# Patient Record
Sex: Female | Born: 1960 | Race: White | Hispanic: No | Marital: Single | Smoking: Former smoker
Health system: Southern US, Community
[De-identification: ages and names within clinical notes are randomized; demographics above are authoritative.]

## PROBLEM LIST (undated history)

## (undated) DIAGNOSIS — Z803 Family history of malignant neoplasm of breast: Secondary | ICD-10-CM

## (undated) DIAGNOSIS — G43909 Migraine, unspecified, not intractable, without status migrainosus: Secondary | ICD-10-CM

## (undated) DIAGNOSIS — B019 Varicella without complication: Secondary | ICD-10-CM

## (undated) DIAGNOSIS — F17200 Nicotine dependence, unspecified, uncomplicated: Secondary | ICD-10-CM

## (undated) DIAGNOSIS — F329 Major depressive disorder, single episode, unspecified: Secondary | ICD-10-CM

## (undated) DIAGNOSIS — Z800 Family history of malignant neoplasm of digestive organs: Secondary | ICD-10-CM

## (undated) DIAGNOSIS — Z923 Personal history of irradiation: Secondary | ICD-10-CM

## (undated) DIAGNOSIS — T7840XA Allergy, unspecified, initial encounter: Secondary | ICD-10-CM

## (undated) DIAGNOSIS — D420 Neoplasm of uncertain behavior of cerebral meninges: Secondary | ICD-10-CM

## (undated) DIAGNOSIS — C50919 Malignant neoplasm of unspecified site of unspecified female breast: Secondary | ICD-10-CM

## (undated) DIAGNOSIS — Z85828 Personal history of other malignant neoplasm of skin: Secondary | ICD-10-CM

## (undated) DIAGNOSIS — H532 Diplopia: Secondary | ICD-10-CM

## (undated) DIAGNOSIS — Z9289 Personal history of other medical treatment: Secondary | ICD-10-CM

## (undated) DIAGNOSIS — F419 Anxiety disorder, unspecified: Secondary | ICD-10-CM

## (undated) DIAGNOSIS — K219 Gastro-esophageal reflux disease without esophagitis: Secondary | ICD-10-CM

## (undated) DIAGNOSIS — F32A Depression, unspecified: Secondary | ICD-10-CM

## (undated) DIAGNOSIS — I669 Occlusion and stenosis of unspecified cerebral artery: Secondary | ICD-10-CM

## (undated) DIAGNOSIS — R432 Parageusia: Secondary | ICD-10-CM

## (undated) DIAGNOSIS — R569 Unspecified convulsions: Secondary | ICD-10-CM

## (undated) DIAGNOSIS — Z8041 Family history of malignant neoplasm of ovary: Secondary | ICD-10-CM

## (undated) DIAGNOSIS — R002 Palpitations: Secondary | ICD-10-CM

## (undated) DIAGNOSIS — Z808 Family history of malignant neoplasm of other organs or systems: Secondary | ICD-10-CM

## (undated) HISTORY — DX: Personal history of other malignant neoplasm of skin: Z85.828

## (undated) HISTORY — DX: Unspecified convulsions: R56.9

## (undated) HISTORY — DX: Malignant neoplasm of unspecified site of unspecified female breast: C50.919

## (undated) HISTORY — DX: Palpitations: R00.2

## (undated) HISTORY — DX: Family history of malignant neoplasm of other organs or systems: Z80.8

## (undated) HISTORY — DX: Family history of malignant neoplasm of digestive organs: Z80.0

## (undated) HISTORY — DX: Allergy, unspecified, initial encounter: T78.40XA

## (undated) HISTORY — DX: Family history of malignant neoplasm of breast: Z80.3

## (undated) HISTORY — DX: Nicotine dependence, unspecified, uncomplicated: F17.200

## (undated) HISTORY — DX: Migraine, unspecified, not intractable, without status migrainosus: G43.909

## (undated) HISTORY — PX: COLONOSCOPY: SHX174

## (undated) HISTORY — DX: Occlusion and stenosis of unspecified cerebral artery: I66.9

## (undated) HISTORY — DX: Family history of malignant neoplasm of ovary: Z80.41

## (undated) HISTORY — DX: Parageusia: R43.2

## (undated) HISTORY — DX: Gastro-esophageal reflux disease without esophagitis: K21.9

## (undated) HISTORY — DX: Anxiety disorder, unspecified: F41.9

## (undated) HISTORY — DX: Varicella without complication: B01.9

## (undated) HISTORY — DX: Neoplasm of uncertain behavior of cerebral meninges: D42.0

## (undated) HISTORY — PX: OTHER SURGICAL HISTORY: SHX169

## (undated) HISTORY — DX: Diplopia: H53.2

---

## 1988-10-18 HISTORY — PX: WISDOM TOOTH EXTRACTION: SHX21

## 1998-04-07 ENCOUNTER — Emergency Department (HOSPITAL_COMMUNITY): Admission: EM | Admit: 1998-04-07 | Discharge: 1998-04-07 | Payer: Self-pay | Admitting: Emergency Medicine

## 1998-10-23 ENCOUNTER — Other Ambulatory Visit: Admission: RE | Admit: 1998-10-23 | Discharge: 1998-10-23 | Payer: Self-pay | Admitting: Obstetrics and Gynecology

## 1999-12-24 ENCOUNTER — Other Ambulatory Visit: Admission: RE | Admit: 1999-12-24 | Discharge: 1999-12-24 | Payer: Self-pay | Admitting: Gynecology

## 2001-01-19 ENCOUNTER — Other Ambulatory Visit: Admission: RE | Admit: 2001-01-19 | Discharge: 2001-01-19 | Payer: Self-pay | Admitting: Obstetrics and Gynecology

## 2002-02-05 ENCOUNTER — Other Ambulatory Visit: Admission: RE | Admit: 2002-02-05 | Discharge: 2002-02-05 | Payer: Self-pay | Admitting: Obstetrics and Gynecology

## 2006-10-18 HISTORY — PX: APPENDECTOMY: SHX54

## 2007-05-10 ENCOUNTER — Other Ambulatory Visit: Admission: RE | Admit: 2007-05-10 | Discharge: 2007-05-10 | Payer: Self-pay | Admitting: Obstetrics and Gynecology

## 2009-03-04 ENCOUNTER — Other Ambulatory Visit: Admission: RE | Admit: 2009-03-04 | Discharge: 2009-03-04 | Payer: Self-pay

## 2010-07-21 ENCOUNTER — Ambulatory Visit: Payer: Self-pay | Admitting: Nurse Practitioner

## 2010-10-27 ENCOUNTER — Ambulatory Visit: Admit: 2010-10-27 | Payer: Self-pay | Admitting: Obstetrics & Gynecology

## 2011-03-02 NOTE — Assessment & Plan Note (Signed)
NAME:  Lori Mcmillan, Lori Mcmillan            ACCOUNT NO.:  [id number]   MEDICAL RECORD NO.:  [id number]          PATIENT TYPE:  POB   LOCATION:  [facility] [place]         FACILITY:  [facility]   PHYSICIAN:  Elsie Lincoln, MD      DATE OF BIRTH:  1961-10-02   DATE OF SERVICE:  07/21/2010                                  CLINIC NOTE   The patient comes to office today for her annual exam and Pap smear.  The patient is a new patient to this practice.  She has several  complaints today.  The first is that what she describes as menopausal  symptoms.  She has feeling like she has more anxiety, hot flashes, and  weight gain.  She does have a regular menstrual cycle.  She does not  exercise.  She eats 2 meals per day.  Otherwise, she does not have any  complaints today.  She has gotten her mammograms on a regular basis.  She has no issues there.  She is currently not using birth control.  She  does not have any sexual relationship.  She has used birth control in  the past without any difficulties.   MENSTRUAL HISTORY:  First day of last period was June 27, 2010.  Her periods are regular.  They are 28 days.  She has 6 days of heavy  bleeding.  She does not sleep between her periods.   OBSTETRICAL HISTORY:  G0, P0.  STD history is negative.   PAST MEDICAL HISTORY:  The patient is positive for stomach ulcers,  reflux, some depression, anxiety, fibroids, ovarian cysts.   PAST SURGICAL HISTORY:  The patient has had an appendectomy.   SOCIAL HISTORY:  The patient is employed as a Sports coach.  She has one  cat and one dog.  She does not smoke.  She drinks 1-2 alcoholic  beverages per day.  She does not have risky sexual behavior.   FAMILY HISTORY:  Mother breast cancer.  Grandmother pancreatic cancer.   REVIEW OF SYSTEMS:  Positive for weight gain, anxiety, menopausal  symptoms hot flashes.   PHYSICAL EXAMINATION:  GENERAL:  A well-developed, well-nourished 50-  year-old Caucasian female in no  acute distress.  HEENT:  Head is normocephalic, atraumatic.  Pupils equal and reactive.  Thyroid is not enlarged.  There are no masses appreciated.  BREASTS:  Symmetrical.  There is no skin dimpling.  There is no lumps.  No nipple  discharge.  ABDOMEN:  Obese.  No organomegaly.  No tenderness.  GENITALIA:  Externally, no lesions or discharges; internally, thin white  nonodorous discharge.  Cervix is without lesions.  Bimanual exam, no  cervical motion tenderness.  No ovarian mass.  EXTREMITIES:  Warm and dry.  NEUROLOGIC:  The patient is somewhat anxious and talkative.   ASSESSMENT:  1. Yearly Pap and pelvic.  2. Anxiety.  3. Obesity.   PLAN:  The patient has a regular family practice physician that does her  routine labs and prescribes her medications and she will continue to get  her medications there.  We did talk about restarting her on birth  control pills for control of her heavy menstrual cycle as well as  hopefully some of her menopausal symptoms.  She is given 2 sample packs  of Loestrin FE.  She is also given a prescription for #28 with 11  refills.  Her blood pressure was slightly elevated today.  She will come  back in 3 months and have that rechecked.  She is also sent to [facility] nutritionist for nutritional evaluation and diet plan.  She is  encouraged to exercise and lose a total of 20 pounds.  She is also asked  to see a dermatologist for routine skin care.  She will return here in 3  months.  We will assess how she is doing with her birth control pills as  well as her exercise and weight loss program.  She can certainly contact  us if she has any problems in the meantime.      Remonia Richter, NP    ______________________________  Elsie Lincoln, MD    LR/MEDQ  D:  07/21/2010  T:  07/22/2010  Job:  [id number]

## 2011-11-30 ENCOUNTER — Other Ambulatory Visit: Payer: Self-pay | Admitting: Radiology

## 2011-12-01 ENCOUNTER — Other Ambulatory Visit: Payer: Self-pay | Admitting: Radiology

## 2011-12-01 DIAGNOSIS — C50911 Malignant neoplasm of unspecified site of right female breast: Secondary | ICD-10-CM

## 2011-12-02 ENCOUNTER — Other Ambulatory Visit: Payer: Self-pay | Admitting: *Deleted

## 2011-12-02 ENCOUNTER — Telehealth: Payer: Self-pay | Admitting: *Deleted

## 2011-12-02 DIAGNOSIS — Z171 Estrogen receptor negative status [ER-]: Secondary | ICD-10-CM | POA: Insufficient documentation

## 2011-12-02 DIAGNOSIS — C50419 Malignant neoplasm of upper-outer quadrant of unspecified female breast: Secondary | ICD-10-CM | POA: Insufficient documentation

## 2011-12-02 NOTE — Telephone Encounter (Signed)
Confirmed [facility] for 12/08/11 at 0800 .  Instructions and contact information given.

## 2011-12-05 ENCOUNTER — Ambulatory Visit
Admission: RE | Admit: 2011-12-05 | Discharge: 2011-12-05 | Disposition: A | Discharge status: Home / Self Care | Payer: Managed Care, Other (non HMO) | Source: Ambulatory Visit | Attending: Radiology | Admitting: Radiology

## 2011-12-05 DIAGNOSIS — C50911 Malignant neoplasm of unspecified site of right female breast: Secondary | ICD-10-CM

## 2011-12-05 MED ORDER — GADOBENATE DIMEGLUMINE 529 MG/ML IV SOLN
16.0000 mL | Freq: Once | INTRAVENOUS | Status: AC | PRN
  Administered 2011-12-05: 16 mL via INTRAVENOUS

## 2011-12-06 ENCOUNTER — Other Ambulatory Visit: Payer: Self-pay

## 2011-12-08 ENCOUNTER — Ambulatory Visit: Payer: Managed Care, Other (non HMO)

## 2011-12-08 ENCOUNTER — Ambulatory Visit
Admission: RE | Admit: 2011-12-08 | Discharge: 2011-12-08 | Disposition: A | Discharge status: Home / Self Care | Payer: Managed Care, Other (non HMO) | Source: Ambulatory Visit | Attending: Radiation Oncology | Admitting: Radiation Oncology

## 2011-12-08 ENCOUNTER — Telehealth: Payer: Self-pay | Admitting: *Deleted

## 2011-12-08 ENCOUNTER — Ambulatory Visit: Payer: Managed Care, Other (non HMO) | Attending: General Surgery | Admitting: Physical Therapy

## 2011-12-08 ENCOUNTER — Ambulatory Visit (HOSPITAL_BASED_OUTPATIENT_CLINIC_OR_DEPARTMENT_OTHER): Payer: Managed Care, Other (non HMO) | Admitting: General Surgery

## 2011-12-08 ENCOUNTER — Ambulatory Visit (HOSPITAL_BASED_OUTPATIENT_CLINIC_OR_DEPARTMENT_OTHER): Payer: Managed Care, Other (non HMO) | Admitting: Oncology

## 2011-12-08 ENCOUNTER — Other Ambulatory Visit: Payer: Managed Care, Other (non HMO) | Admitting: Lab

## 2011-12-08 ENCOUNTER — Encounter: Payer: Self-pay | Admitting: Oncology

## 2011-12-08 ENCOUNTER — Encounter: Payer: Self-pay | Admitting: *Deleted

## 2011-12-08 ENCOUNTER — Other Ambulatory Visit (INDEPENDENT_AMBULATORY_CARE_PROVIDER_SITE_OTHER): Payer: Self-pay | Admitting: General Surgery

## 2011-12-08 ENCOUNTER — Encounter (INDEPENDENT_AMBULATORY_CARE_PROVIDER_SITE_OTHER): Payer: Self-pay | Admitting: General Surgery

## 2011-12-08 VITALS — BP 123/73 | HR 85 | Temp 98.7°F | Ht 63.2 in | Wt 194.8 lb

## 2011-12-08 DIAGNOSIS — IMO0001 Reserved for inherently not codable concepts without codable children: Secondary | ICD-10-CM | POA: Insufficient documentation

## 2011-12-08 DIAGNOSIS — Z803 Family history of malignant neoplasm of breast: Secondary | ICD-10-CM

## 2011-12-08 DIAGNOSIS — C50919 Malignant neoplasm of unspecified site of unspecified female breast: Secondary | ICD-10-CM

## 2011-12-08 DIAGNOSIS — D0590 Unspecified type of carcinoma in situ of unspecified breast: Secondary | ICD-10-CM

## 2011-12-08 DIAGNOSIS — C50419 Malignant neoplasm of upper-outer quadrant of unspecified female breast: Secondary | ICD-10-CM

## 2011-12-08 DIAGNOSIS — R293 Abnormal posture: Secondary | ICD-10-CM | POA: Insufficient documentation

## 2011-12-08 DIAGNOSIS — Z8041 Family history of malignant neoplasm of ovary: Secondary | ICD-10-CM

## 2011-12-08 DIAGNOSIS — R002 Palpitations: Secondary | ICD-10-CM

## 2011-12-08 LAB — CBC WITH DIFFERENTIAL/PLATELET
Basophils Absolute: 0.1 10*3/uL (ref 0.0–0.1)
EOS%: 2.4 % (ref 0.0–7.0)
Eosinophils Absolute: 0.2 10*3/uL (ref 0.0–0.5)
HCT: 34.9 % (ref 34.8–46.6)
HGB: 11.8 g/dL (ref 11.6–15.9)
MCH: 29.9 pg (ref 25.1–34.0)
MCV: 88.3 fL (ref 79.5–101.0)
NEUT#: 6.6 10*3/uL — ABNORMAL HIGH (ref 1.5–6.5)
NEUT%: 66.8 % (ref 38.4–76.8)
RDW: 14 % (ref 11.2–14.5)
lymph#: 2.2 10*3/uL (ref 0.9–3.3)

## 2011-12-08 LAB — COMPREHENSIVE METABOLIC PANEL
Albumin: 3.7 g/dL (ref 3.5–5.2)
BUN: 17 mg/dL (ref 6–23)
CO2: 23 mEq/L (ref 19–32)
Calcium: 8.7 mg/dL (ref 8.4–10.5)
Chloride: 105 mEq/L (ref 96–112)
Creatinine, Ser: 0.63 mg/dL (ref 0.50–1.10)
Glucose, Bld: 97 mg/dL (ref 70–99)
Potassium: 4.8 mEq/L (ref 3.5–5.3)

## 2011-12-08 LAB — CANCER ANTIGEN 27.29: CA 27.29: 8 U/mL (ref 0–39)

## 2011-12-08 NOTE — Progress Notes (Signed)
Mailed after appt letter to pt. 

## 2011-12-08 NOTE — Telephone Encounter (Signed)
na

## 2011-12-08 NOTE — Progress Notes (Signed)
Dr.  Welton Flakes requested a consultation for genetic counseling and risk assessment for Lori Mcmillan, a 51 y.o. female, for discussion of her personal history of breast cancer. She presents to clinic today to discuss the possibility of a genetic predisposition to cancer, and to further clarify her risks, as well as her family members' risks for cancer.   HISTORY OF PRESENT ILLNESS: In 2013, at the age of 89, Lori Mcmillan was diagnosed with Invasive Ductal Carcinoma of the right breast.     Past Medical History  Diagnosis Date  . Palpitations   . Anxiety     Past Surgical History  Procedure Date  . Appendectomy     History  Substance Use Topics  . Smoking status: Current Some Day Smoker -- 0.1 packs/day  . Smokeless tobacco: Not on file  . Alcohol Use: 7.2 oz/week    12 Glasses of wine per week    REPRODUCTIVE HISTORY AND PERSONAL RISK ASSESSMENT FACTORS: Premenopausal Uterus Intact: Yes Ovaries Intact: Yes [id number]     FAMILY HISTORY:  We obtained a detailed, 4-generation family history.  Significant diagnoses are listed below: Lori Mcmillan was diagnosed with IDC at age 72.  Her mother was diagnosed with breast cancer at age 15.  Her mother has 5 sisters and two brothers.  One sister was diagnosed with ovarian cancer at age 69, a second sister was diagnosed with breast cancer at age 32, a third sister was diagnosed with bilateral breast cancer at age 9.  One brother died in infancy and the other was diangosed with pancreatic cancer in his 87s.  Lori Mcmillan's fathers side of is significant for four of his five paternal half brothers being diagnosed with cancer.  One half brother was diagnosed with lung cancer (he was a smoker), and another brother with stomach cancer.  The remaining two half brothers had an unknown cancer. Lori Mcmillan's paternal grandmother reportedly had pancreatic cancer.  Patient's maternal ancestors are of German/English descent, and paternal ancestors are of  Irish/English descent. There is no reported Ashkenazi Jewish ancestry. There is no known consanguinity.  GENETIC COUNSELING RISK ASSESSMENT, DISCUSSION, AND SUGGESTED FOLLOW UP: We reviewed the natural history and genetic etiology of sporadic, familial and hereditary cancer syndromes.  The patient's personal and family history is suggestive of the following possible diagnosis: hereditary breast and ovarian cancer  We discussed that identification of a hereditary cancer syndrome may help her care providers tailor the patients medical management. If a mutation is detected in the BRCA1 or BRCA2 genes, the patient will be referred back to the referring provider and to any additional appropriate care providers to discuss the relevant options.   If a mutation is not found in the patient, this will decrease the likelihood of hereditary breast and ovarian cancer as the explanation for her breast cancer.  However, there are other cancer genes that can be implicated in hereditary breast cancer.  We discussed potential testing for these cancers through a gene panel, and will reassess after getting the BRCA1 and BRCA2 gene mutation results back. Cancer surveillance options would be discussed for the patient according to the appropriate standard National Comprehensive Cancer Network and American Cancer Society guidelines, with consideration of their personal and family history risk factors. In this case, the patient will be referred back to their care providers for discussions of management.   Based on the patient's personal and family history, and literature, data were used to estimate her risk of developing breast cancer because of a  BRCA1 or BRCA2 gene mutation.   After considering the risks, benefits, and limitations, the patient provided informed consent for  the following  testing:  BRCA1/BRCA2 and BART through [employer].   Per the patient's request, we will contact her by telephone to  discuss these results. A follow up genetic counseling visit will be scheduled if indicated.  The patient was seen for a total of 45 minutes, greater than 50% of which was spent face-to-face counseling.  This plan is being carried out per Dr. Welton Flakes recommendations.  This note will also be sent to the referring provider via the electronic medical record. The patient will be supplied with a summary of this genetic counseling discussion as well as educational information on the discussed hereditary cancer syndromes following the conclusion of their visit.   Patient was discussed with Dr. Drue Second.    EPIC CC: Dr. Johna Sheriff Dr. Drue Second Dr. Michell Heinrich    EDUCATIONAL INFORMATION SUPPLIED TO PATIENT AT ENCOUNTER:  [employer] Hereditary Breast and Ovarian Cancer Brochure  INFORMATION TO BE MAILED TO PATIENT    _______________________________________________________________________ For Office Staff:  Number of people involved in session: 4 Was an Intern/ student involved with case: not applicable }

## 2011-12-08 NOTE — Progress Notes (Signed)
Subjective:   new diagnosis cancer of right breast  Patient ID: Lori Mcmillan, female   DOB: 04/03/1961, 50 y.o.   MRN: [id number]  HPI The patient is a very pleasant 50-year-old female referred through the courtesy of [facility] for a new diagnosis of cancer of the right breast. She has a very significant family history of breast cancer including her mother at age 70 and maternal aunt at age 40 and maternal aunt at age 82 as well as several cousins and her grandmother. For this reason she has had screening exam since age 37. She has no previous history of a suspicious abnormality or breast biopsy. However her recent screening mammogram revealed an area of asymmetry in the upper outer right breast at the 11:00 position. Subsequent ultrasound showed an irregular hypoechoic 1.7 cm mass. Large core needle biopsy was recommended and performed. 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 It is low-grade and ER/PR positive. The majority of the lesion is Dr. Carcinoma in situ with a small area of invasion. Subsequent MRI confirmed a 1.7 cm mass at the site of the known malignancy with no other abnormality seen. She is seen today in the multidisciplinary breast clinic for treatment planning. She again has not noted any breast mass her pain, skin changes or nipple discharge.  Past Medical History  Diagnosis Date  . Palpitations   . Anxiety    Past Surgical History  Procedure Date  . Appendectomy    Current Outpatient Prescriptions  Medication Sig Dispense Refill  . LORazepam (ATIVAN) 1 MG tablet Take 1 mg by mouth every 8 (eight) hours.      . metoprolol (LOPRESSOR) 50 MG tablet Take 50 mg by mouth 2 (two) times daily.      . Nutritional Supplements (ESTROVEN EXTRA STRENGTH PO) Take by mouth.      . sertraline (ZOLOFT) 100 MG tablet Take 100 mg by mouth daily.       No Known Allergies History  Substance Use Topics  . Smoking status: Current Some  Day Smoker -- 0.1 packs/day  . Smokeless tobacco: Not on file  . Alcohol Use: 7.2 oz/week    12 Glasses of wine per week    Review of Systems  Constitutional: Negative.   HENT: Negative.   Respiratory: Negative.   Cardiovascular: Negative.   Gastrointestinal: Negative.   Musculoskeletal: Negative.        Objective:   Physical Exam General: Alert, mildly obese Caucasian female, in no distress Skin: Warm and dry without rash or infection. HEENT: No palpable masses or thyromegaly. Sclera nonicteric. Pupils equal round and reactive. Oropharynx clear. Lymph nodes: No cervical, supraclavicular, or inguinal nodes palpable. Breasts: Slight bruising upper-outer right breast post biopsy. I cannot feel any masses in either breast and again no palpable adenopathy Lungs: Breath sounds clear and equal without increased work of breathing Cardiovascular: Regular rate and rhythm without murmur. No JVD or edema. Peripheral pulses intact. Abdomen: Nondistended. Soft and nontender. No masses palpable. No organomegaly. No palpable hernias. Extremities: No edema or joint swelling or deformity. No chronic venous stasis changes. Neurologic: Alert and fully oriented. Gait normal.     Assessment:     50-year-old female with new diagnosis of a 1.7 cm in situ and invasive cancer of the upper outer quadrant of the right breast. We discussed initial surgical treatment options in detail. She has a strong family history of breast cancer and genetic testing is certainly indicated and   is underway. She would prefer breast conservation I believe would be a good candidate. We discussed options of mastectomy. We discussed needle localized right breast lumpectomy and axillary sentinel lymph node biopsy. We discussed the nature of the surgery, indications, recovery, and risks of bleeding, infection, anesthetic risks, and possible need for further surgery based on final pathology findings. She understands that radiation is  required. Hormonal versus chemotherapy would depend on her node status and Oncotype. We will tentatively plan breast conservation surgery pending her genetic evaluation. We discussed that prophylactic mastectomy would not increase her chance for survival but she may prefer this if she is positive. We will schedule the surgery out far enough that this result will be available a head of time.    Plan:     Right breast needle localized lumpectomy and axillary sentinel lymph node biopsy as an outpatient under general anesthesia. Prior to scheduling the surgery and we'll make sure that her genetic testing his back as this may influence her surgical decision-making and therefore we will schedule her surgery out about 3 weeks.      

## 2011-12-08 NOTE — Progress Notes (Signed)
CC:   Lori Mcmillan. Lori Mcmillan, M.D. Drue Second, M.D. 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  HISTORY OF PRESENT ILLNESS:  Lori Mcmillan is a pleasant 51 year old female who underwent a right screening mammogram.  A 1 cm mass was noted in the high upper-outer quadrant.  Biopsy was performed which showed the majority of DCIS with a small focus of invasive ductal carcinoma.  ER and PR were positive.  HER2 was not performed as there was insufficient tissue.  The Ki-67 was less than 5%.  An MRI of the bilateral breasts confirmed a 1.7 cm lesion in the right breast.  No other abnormalities were noted in the left breast or bilateral lymph nodes.  Lori Mcmillan was then referred to Multidisciplinary Clinic today for consideration of treatment options.  She reports no breast-related complaints prior to or after her biopsy.  PAST MEDICAL HISTORY:  Status post appendectomy.  MEDICATIONS:  Metoprolol 50 mg daily, sertraline 100 mg daily, Ativan 1 mg p.r.n., Estroven 1 daily.  ALLERGIES:  No known drug allergies.  SURGICAL HISTORY:  Status post appendectomy in 2008.  FAMILY HISTORY:  Both of her parents are alive at 72 and 8.  There is a strong family history of breast and ovarian cancer including multiple aunts, cousins, grandmothers, and mother.  SOCIAL HISTORY:  She is a case Production designer, theatre/television/film.  She is single.  She smoked socially but has quit.  She admits to social alcohol consumption.  GYN HISTORY:  She had menses at 11.  She had her last period at the end of January.  GYN HISTORY:  She is GX, P0.  REVIEW OF SYSTEMS:  Positive for fatigue, hoarseness, breast pain, headaches, numbness, and hot flashes.  All other systems were reviewed and found to be negative.  PHYSICAL EXAMINATION:  General:  She is a pleasant female in no distress sitting comfortably on the exam room table.  Vital Signs:  Weight 194 pounds.  Height 5 feet 3 inches.  Blood pressure  123/73, pulse 85, respirations 20, temperature 98.7.  Lymphatic:  She has no palpable abnormal cervical or supraclavicular lymph nodes.  No palpable axillary adenopathy bilaterally.  Breasts:  She has no palpable abnormalities of the right or left breast.  She has some biopsy change on the right breast with some bruising.  She has no palpable hematoma.  Neurologic: She is alert and oriented x3.  IMPRESSION:  T1a N0 invasive ductal carcinoma of the right breast, ER/PR positive, HER2 pending.  RECOMMENDATIONS:  I spoke with Lori Mcmillan and her family regarding her diagnosis and options for treatment.  We discussed the equivalency in terms of survival between mastectomy and lumpectomy.  She does have genetic testing pending which may influence her treatment decision.  We discussed the role of radiation in decreasing local failure in patients who undergo lumpectomy.  We discussed 6 to 6-1/2 weeks of radiation. We discussed the possible side effects of treatment including but not limited to skin redness and permanent lung damage.  I discussed that we would start radiation after chemotherapy if she was found to have a high Oncotype score.  At this point, I will plan on seeing her back after Dr. Welton Flakes has seen her if she elects for breast conservation.  We discussed that if she did elect for mastectomy she does not have indications for postmastectomy radiation and could have immediate reconstruction.  I spent 1 hour with this patient.  Greater than 50% of that time  was spent in counseling and coordination of care.    ______________________________ Lurline Hare, M.D. SW/MEDQ  D:  12/08/2011  T:  12/08/2011  Job:  [id number]

## 2011-12-12 NOTE — Progress Notes (Signed)
Lori Mcmillan [id number] [birth year] 51 y.o. 12/12/2011 12:42 PM  CC Dr. Lovenia Kim Dr. Lurline Hare Dr. Glenna Fellows  REASON FOR CONSULTATION:  51 year old female with new diagnosis of right breast cancer (T1aNxMx) Patient was seen in the Multidisciplinary Breast Clinic for discussion of her treatment options.   STAGE:  Cancer of upper-outer quadrant of female breast   Primary site: Breast (Right)   Staging method: AJCC 7th Edition   51 year old female with newly diagnosed right breast invasive ductal carcinoma   Summary: Stage IA (T1c, N0, cM0)  REFERRING PHYSICIAN: Dr. Lovenia Kim  HISTORY OF PRESENT ILLNESS:  Lori Mcmillan is a 51 y.o. female otherwise healthy female who on screening mammogram was found to have 1 cm mass in the right breast in the upper outer quadrant. Biopsy showed a small focus of IDC with DCIS. Tumor was ER+/PR+, He2Neu unknown due insufficient sample, Ki-67 5%. MRI of breast showed a 1.7 cm lesion, no other findings. She is seen in the [facility] today accompanied by her brother and sister. She is without any complaints.   Past Medical History: Past Medical History  Diagnosis Date  . Palpitations   . Anxiety     Past Surgical History: Past Surgical History  Procedure Date  . Appendectomy     Family History: Father: alive at 42 Mother 47 with history of breast cancer Breast and ovarian cancers in several family members (cousin, grandmother) Full pedrigree made by our genetics counselor  Social History History  Substance Use Topics  . Smoking status: Current Some Day Smoker -- 0.1 packs/day  . Smokeless tobacco: Not on file  . Alcohol Use: 7.2 oz/week    49 Glasses of wine per week    Allergies: No Known Allergies  Current Medications: Current Outpatient Prescriptions  Medication Sig Dispense Refill  . LORazepam (ATIVAN) 1 MG tablet Take 1 mg by mouth every 8 (eight) hours.      . metoprolol (LOPRESSOR) 50 MG tablet Take 50 mg by mouth 2 (two) times daily.      .  Nutritional Supplements (ESTROVEN EXTRA STRENGTH PO) Take by mouth.      . sertraline (ZOLOFT) 100 MG tablet Take 100 mg by mouth daily.        OB/GYN History:menarche 11, premenopausal LMP 11/12/11, nulliparous,   Fertility Discussion: patient does not wish to have any children  Prior History of Cancer: N/A  Health Maintenance:  Colonoscopy none Bone Density none Last PAP smear up to date  ECOG PERFORMANCE STATUS: 0 - Asymptomatic  Genetic Counseling/testing: patient was seen by the genetic couselor, full pedigree was constructed and blood was drawn for the comprehensive BRCA1 and BRCA2 analysis  REVIEW OF SYSTEMS:  A comprehensive review of systems was negative.  PHYSICAL EXAMINATION: Blood pressure 123/73, pulse 85, temperature 98.7 F (37.1 C), temperature source Oral, height 5' 3.2" (1.605 m), weight 194 lb 12.8 oz (88.361 kg).  ION:GEXBM, healthy, no distress, well nourished, well developed and anxious SKIN: skin color, texture, turgor are normal HEAD: No masses, lesions, tenderness or abnormalities EYES: PERRLA, EOMI, Conjunctiva are pink and non-injected, sclera clear EARS: External ears normal OROPHARYNX:no exudate, no erythema and lips, buccal mucosa, and tongue normal  NECK: no adenopathy, no bruits, no JVD, thyroid normal size, non-tender, without nodularity LYMPH:  no palpable lymphadenopathy, no hepatosplenomegaly BREAST: area of bruising at the biopsy site, no palpable masses LUNGS: clear to auscultation and percussion HEART: regular rate & rhythm, no murmurs and no gallops ABDOMEN:abdomen soft, non-tender, obese,  normal bowel sounds and no masses or organomegaly BACK: Back symmetric, no curvature., No CVA tenderness EXTREMITIES:no edema, no clubbing, no cyanosis  NEURO: alert & oriented x 3 with fluent speech, no focal motor/sensory deficits, gait normal, reflexes normal and symmetric  STUDIES/RESULTS: Mr Breast Bilateral W Wo Contrast  12/06/2011   *RADIOLOGY REPORT*  Clinical Data: 51 year old female with newly diagnosed right breast invasive ductal carcinoma.  BILATERAL BREAST MRI WITH AND WITHOUT CONTRAST  Technique: Multiplanar, multisequence MR images of both breasts were obtained prior to and following the intravenous administration of 16ml of Multihance.  Three dimensional images were evaluated at the independent DynaCad workstation.  Comparison:  Recent mammograms and ultrasound from [place].  Findings: Mild to moderate normal background parenchymal enhancement is identified.  A 1.3 x 1.7 x 1.5 cm irregular mass (rapid washin/washout kinetics) is located within the upper outer right breast, posterior third. Biopsy clip artifact is identified along the superior aspect of this mass, which represents the biopsy-proven neoplasm.  No other abnormal areas of enhancement are identified bilaterally.  No enlarged or abnormal-appearing lymph nodes are identified. The bony structures and upper liver are within normal limits.  IMPRESSION: 1.3 x 1.7 x 1.5 cm biopsy-proven neoplasm within the upper outer right breast.  No evidence of multifocal, multicentric or contralateral neoplasm.  No abnormal appearing or enlarged lymph nodes identified.  THREE-DIMENSIONAL MR IMAGE RENDERING ON INDEPENDENT WORKSTATION:  Three-dimensional MR images were rendered by post-processing of the original MR data on an independent workstation.  The three- dimensional MR images were interpreted, and findings were reported in the accompanying complete MRI report for this study.  BI-RADS CATEGORY 6:  Known biopsy-proven malignancy - appropriate action should be taken.  Recommend treatment plan.  Original Report Authenticated By: Rosendo Gros, M.D.     LABS:    Chemistry      Component Value Date/Time   NA 138 12/08/2011 0819   K 4.8 12/08/2011 0819   CL 105 12/08/2011 0819   CO2 23 12/08/2011 0819   BUN 17 12/08/2011 0819   CREATININE 0.63 12/08/2011 0819      Component Value  Date/Time   CALCIUM 8.7 12/08/2011 0819   ALKPHOS 116 12/08/2011 0819   AST 26 12/08/2011 0819   ALT 34 12/08/2011 0819   BILITOT 0.1* 12/08/2011 0819      Lab Results  Component Value Date   WBC 9.9 12/08/2011   HGB 11.8 12/08/2011   HCT 34.9 12/08/2011   MCV 88.3 12/08/2011   PLT 314 12/08/2011       PATHOLOGY: ADDITIONAL INFORMATION: CHROMOGENIC IN-SITU HYBRIDIZATION Interpretation: THE HER 2:CEP 17 RATIO OF 2.07 IS IN THE EQUIVOCAL RANGE FOR AMPLIFICATION. OF NOTE, 40 TUMOR CELLS WERE COUNTED. Reference range: Ratio: HER2:CEP 17 < 1.8 - gene amplification not observed Ratio: HER2:CEP 17 1.8 - 2.2 - equivocal results Ratio: HER2:CEP 17 > 2.2 - gene amplification observed Pecola Leisure MD Pathologist, Electronic Signature ( Signed 12/10/2011) PROGNOSTIC INDICATORS - ACIS Results IMMUNOHISTOCHEMICAL AND MORPHOMETRIC ANALYSIS BY THE AUTOMATED CELLULAR IMAGING SYSTEM (ACIS) Estrogen Receptor (Negative, <1%): 100%, STRONG STAINING INTENSITY Progesterone Receptor (Negative, <1%): 100%, STRONG STAINING INTENSITY Proliferation Marker Ki67 by M IB-1 (Low<20%): 5% All controls stained appropriately Pecola Leisure MD Pathologist, Electronic Signature ( Signed 12/08/2011) FINAL DIAGNOSIS 1 of 2 FINAL for Guilbault, Karaline K ([id number]) Diagnosis Breast, right, needle core biopsy, mass, subaxillary 10 o'clock - INVASIVE DUCTAL CARCINOMA, SEE COMMENT. 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 Microscopic Comment Although  the grade of tumor is best assessed at resection, with these biopsies, the invasive tumor is Grade I and the in situ carcinoma is II/III. The absence of the myoepithelial layer is confirmed with p63, smooth muscle myosin heavy chain and Calponin immunostains. Breast prognostic studies are pending and will be reported in an addendum. The case is reviewed with Dr. Colonel Bald who concurs. (CR:mw 12-30-11) [place] RUND DO Pathologist, Electronic  Signature (Case signed 12/02/2011) Specimen Gross and Clinical  ASSESSMENT    51 year old with:  1. Invasive Ductal carcinoma of the right breast ER/PR+ Her2Neu now read out to be equivocal, tumor measuring 1.7 cm by MRI no nodes, clinical stage I, patients case was discussed at the Multidisciplinary conference this am.  2. Palpitations  3. Strong family history of breast and Ovarian cancers     PLAN:    1. Lumpectomy with SNL, repeat prognostic markers especially Her2Neu 2. Genetic counseling and testing 3. Radiation therapy post lumpectomy 4. Adjuvant systemic therapy will be planned based on patients final pathology and results of the Her2Neu., If Her2Neu is positive then we will give her herceptin based treatemtn. Bu if it is negative then we will send an oncotype Dx for evaluate her recurrence score.       Discussion: Patient is being treated per NCCN breast cancer care guidelines appropriate for stage I   Thank you so much for allowing me to participate in the care of Lori Mcmillan. I will continue to follow up the patient with you and assist in her care.  All questions were answered. The patient knows to call the clinic with any problems, questions or concerns. We can certainly see the patient much sooner if necessary.  I spent 60 minutes counseling the patient face to face. The total time spent in the appointment was 60 minutes.  Drue Second, MD Medical/Oncology [facility] [phone] (beeper) [phone] (Office)  12/12/2011, 12:42 PM 12/12/2011, 12:42 PM

## 2011-12-13 ENCOUNTER — Telehealth: Payer: Self-pay | Admitting: *Deleted

## 2011-12-13 NOTE — Telephone Encounter (Signed)
Spoke with pt concerning BMDC from 2/20.  Pt denies questions or concerns regarding dx or treatment care plan.  Pt questioned how her physician team would find out the results of genetic testing.  Informed pt that I would inform MD team of genetic results and if need for another consult would schedule appropriately.  Encourage pt to call with needs. Received verbal understanding.  Contact information given.

## 2011-12-22 ENCOUNTER — Telehealth: Payer: Self-pay | Admitting: Genetic Counselor

## 2011-12-22 ENCOUNTER — Encounter: Payer: Self-pay | Admitting: Genetic Counselor

## 2011-12-22 NOTE — Telephone Encounter (Signed)
Called patient to let her know of negative BRCA and BART test results.  She has an appointment on 3/29, at that time we can discuss further if she would want to send a Breast Next Panel to [place].

## 2011-12-29 ENCOUNTER — Other Ambulatory Visit (INDEPENDENT_AMBULATORY_CARE_PROVIDER_SITE_OTHER): Payer: Self-pay | Admitting: General Surgery

## 2011-12-29 ENCOUNTER — Encounter (HOSPITAL_COMMUNITY): Payer: Self-pay | Admitting: Pharmacy Technician

## 2011-12-29 DIAGNOSIS — C50919 Malignant neoplasm of unspecified site of unspecified female breast: Secondary | ICD-10-CM

## 2011-12-30 ENCOUNTER — Telehealth: Payer: Self-pay | Admitting: Oncology

## 2011-12-30 ENCOUNTER — Encounter: Payer: Self-pay | Admitting: *Deleted

## 2011-12-30 ENCOUNTER — Telehealth: Payer: Self-pay | Admitting: *Deleted

## 2011-12-30 NOTE — Telephone Encounter (Signed)
S/w the pt and she is aware of her r/s march appt to April 2013

## 2011-12-30 NOTE — Telephone Encounter (Signed)
per orders from dawn moved patient appointment to 01-24-2012 at 2:00 pm called patient at home number to inform the patient of the new date and time asked patient to please call me back and let me know she did the message

## 2011-12-31 ENCOUNTER — Encounter (HOSPITAL_COMMUNITY): Payer: Self-pay

## 2011-12-31 ENCOUNTER — Encounter (HOSPITAL_COMMUNITY)
Admission: RE | Admit: 2011-12-31 | Discharge: 2011-12-31 | Disposition: A | Discharge status: Home / Self Care | Payer: Managed Care, Other (non HMO) | Source: Ambulatory Visit | Attending: General Surgery | Admitting: General Surgery

## 2011-12-31 ENCOUNTER — Other Ambulatory Visit: Payer: Self-pay

## 2011-12-31 HISTORY — DX: Depression, unspecified: F32.A

## 2011-12-31 HISTORY — DX: Major depressive disorder, single episode, unspecified: F32.9

## 2011-12-31 LAB — SURGICAL PCR SCREEN
MRSA, PCR: NEGATIVE
Staphylococcus aureus: NEGATIVE

## 2011-12-31 LAB — HCG, SERUM, QUALITATIVE: Preg, Serum: NEGATIVE

## 2011-12-31 LAB — CBC
HCT: 38 % (ref 36.0–46.0)
Hemoglobin: 12.5 g/dL (ref 12.0–15.0)
MCHC: 32.9 g/dL (ref 30.0–36.0)
RBC: 4.2 MIL/uL (ref 3.87–5.11)

## 2011-12-31 NOTE — Progress Notes (Signed)
PAT appt- pt asked to have BP rechecked before leaving Short Stay. BP 160/103. Pt denies h/o HTN. Takes Metoprolol for palpitations. Pt advised to have BP rechecked and go to Urgent care this weekend if it remains high. Pt states she is anxious and feels "hyper". Will go home and have her pressure rechecked when she picks up her prescriptions.

## 2011-12-31 NOTE — Pre-Procedure Instructions (Signed)
20 Lori Mcmillan  12/31/2011   Your procedure is scheduled on:  Tuesday, March 19  Report to [facility] at 0745 AM.  Call this number if you have problems the morning of surgery: [phone]   Remember:   Do not eat food:After Midnight.  May have clear liquids: up to 4 Hours before arrival.  Clear liquids include soda, tea, black coffee, apple or grape juice, broth.  Take these medicines the morning of surgery with A SIP OF WATER: *Metoprolol,Prislosec,Sertraline, and Lorazepam,if needed.**   Do not wear jewelry, make-up or nail polish.  Do not wear lotions, powders, or perfumes. You may wear deodorant.  Do not shave 48 hours prior to surgery.  Do not bring valuables to the hospital.  Contacts, dentures or bridgework may not be worn into surgery.  Leave suitcase in the car. After surgery it may be brought to your room.  For patients admitted to the hospital, checkout time is 11:00 AM the day of discharge.   Patients discharged the day of surgery will not be allowed to drive home.     Special Instructions: CHG Shower Use Special Wash: 1/2 bottle night before surgery and 1/2 bottle morning of surgery.   Please read over the following fact sheets that you were given: Pain Booklet, Coughing and Deep Breathing, MRSA Information and Surgical Site Infection Prevention

## 2012-01-03 ENCOUNTER — Telehealth (INDEPENDENT_AMBULATORY_CARE_PROVIDER_SITE_OTHER): Payer: Self-pay

## 2012-01-03 MED ORDER — CEFAZOLIN SODIUM 1-5 GM-% IV SOLN: 1.0000 g | INTRAVENOUS | Status: DC

## 2012-01-03 MED ORDER — CEFAZOLIN SODIUM-DEXTROSE 2-3 GM-% IV SOLR
2.0000 g | INTRAVENOUS | Status: AC
  Administered 2012-01-04: 2 g via INTRAVENOUS
  Filled 2012-01-03: qty 50

## 2012-01-03 NOTE — Telephone Encounter (Signed)
Rec'd a message stating Lori Mcmillan's Genetic Testing results were negative and we can move forward to schedule her surgery.

## 2012-01-04 ENCOUNTER — Ambulatory Visit
Admission: RE | Admit: 2012-01-04 | Discharge: 2012-01-04 | Disposition: A | Discharge status: Home / Self Care | Payer: Managed Care, Other (non HMO) | Source: Ambulatory Visit | Attending: General Surgery | Admitting: General Surgery

## 2012-01-04 ENCOUNTER — Encounter (HOSPITAL_COMMUNITY): Payer: Self-pay | Admitting: Anesthesiology

## 2012-01-04 ENCOUNTER — Ambulatory Visit (HOSPITAL_COMMUNITY)
Admission: RE | Admit: 2012-01-04 | Discharge: 2012-01-04 | Disposition: A | Discharge status: Home / Self Care | Payer: Managed Care, Other (non HMO) | Source: Ambulatory Visit | Attending: General Surgery | Admitting: General Surgery

## 2012-01-04 ENCOUNTER — Encounter (HOSPITAL_COMMUNITY)
Admission: RE | Disposition: A | Discharge status: Home / Self Care | Payer: Self-pay | Source: Ambulatory Visit | Attending: General Surgery

## 2012-01-04 ENCOUNTER — Ambulatory Visit (HOSPITAL_COMMUNITY): Payer: Managed Care, Other (non HMO) | Admitting: Anesthesiology

## 2012-01-04 ENCOUNTER — Encounter (HOSPITAL_COMMUNITY): Payer: Self-pay | Admitting: *Deleted

## 2012-01-04 DIAGNOSIS — C50919 Malignant neoplasm of unspecified site of unspecified female breast: Secondary | ICD-10-CM

## 2012-01-04 DIAGNOSIS — D0590 Unspecified type of carcinoma in situ of unspecified breast: Secondary | ICD-10-CM | POA: Insufficient documentation

## 2012-01-04 DIAGNOSIS — Z803 Family history of malignant neoplasm of breast: Secondary | ICD-10-CM | POA: Insufficient documentation

## 2012-01-04 DIAGNOSIS — F411 Generalized anxiety disorder: Secondary | ICD-10-CM | POA: Insufficient documentation

## 2012-01-04 DIAGNOSIS — F329 Major depressive disorder, single episode, unspecified: Secondary | ICD-10-CM | POA: Insufficient documentation

## 2012-01-04 DIAGNOSIS — C50419 Malignant neoplasm of upper-outer quadrant of unspecified female breast: Secondary | ICD-10-CM | POA: Insufficient documentation

## 2012-01-04 DIAGNOSIS — Z01812 Encounter for preprocedural laboratory examination: Secondary | ICD-10-CM | POA: Insufficient documentation

## 2012-01-04 DIAGNOSIS — Z01810 Encounter for preprocedural cardiovascular examination: Secondary | ICD-10-CM | POA: Insufficient documentation

## 2012-01-04 DIAGNOSIS — F17200 Nicotine dependence, unspecified, uncomplicated: Secondary | ICD-10-CM | POA: Insufficient documentation

## 2012-01-04 DIAGNOSIS — F3289 Other specified depressive episodes: Secondary | ICD-10-CM | POA: Insufficient documentation

## 2012-01-04 HISTORY — PX: BREAST LUMPECTOMY: SHX2

## 2012-01-04 LAB — BASIC METABOLIC PANEL
BUN: 12 mg/dL (ref 6–23)
Chloride: 103 mEq/L (ref 96–112)
GFR calc Af Amer: >90 mL/min (ref >90)
GFR calc non Af Amer: >90 mL/min (ref >90)
Potassium: 4.1 mEq/L (ref 3.5–5.1)
Sodium: 137 mEq/L (ref 135–145)

## 2012-01-04 SURGERY — BREAST LUMPECTOMY WITH NEEDLE LOCALIZATION AND AXILLARY SENTINEL LYMPH NODE BX
Anesthesia: General | Site: Breast | Laterality: Right | Wound class: Clean

## 2012-01-04 MED ORDER — NEOSTIGMINE METHYLSULFATE 1 MG/ML IJ SOLN
INTRAMUSCULAR | Status: DC | PRN
  Administered 2012-01-04: 4 mg via INTRAVENOUS

## 2012-01-04 MED ORDER — ONDANSETRON HCL 4 MG/2ML IJ SOLN: 4.0000 mg | Freq: Four times a day (QID) | INTRAMUSCULAR | Status: DC | PRN

## 2012-01-04 MED ORDER — BUPIVACAINE-EPINEPHRINE 0.25% -1:200000 IJ SOLN
INTRAMUSCULAR | Status: DC | PRN
  Administered 2012-01-04: 30 mL

## 2012-01-04 MED ORDER — LIDOCAINE HCL (CARDIAC) 20 MG/ML IV SOLN
INTRAVENOUS | Status: DC | PRN
  Administered 2012-01-04: 20 mg via INTRAVENOUS
  Administered 2012-01-04: 80 mg via INTRAVENOUS

## 2012-01-04 MED ORDER — OXYCODONE-ACETAMINOPHEN 5-325 MG PO TABS: 1.0000 | ORAL_TABLET | ORAL | Status: AC | PRN

## 2012-01-04 MED ORDER — FENTANYL CITRATE 0.05 MG/ML IJ SOLN
INTRAMUSCULAR | Status: DC | PRN
  Administered 2012-01-04 (×4): 50 ug via INTRAVENOUS
  Administered 2012-01-04: 100 ug via INTRAVENOUS

## 2012-01-04 MED ORDER — MIDAZOLAM HCL 5 MG/5ML IJ SOLN
INTRAMUSCULAR | Status: DC | PRN
  Administered 2012-01-04 (×2): 1 mg via INTRAVENOUS

## 2012-01-04 MED ORDER — PROPOFOL 10 MG/ML IV EMUL
INTRAVENOUS | Status: DC | PRN
  Administered 2012-01-04: 20 mg via INTRAVENOUS
  Administered 2012-01-04: 180 mg via INTRAVENOUS

## 2012-01-04 MED ORDER — TECHNETIUM TC 99M SULFUR COLLOID FILTERED
1.0000 | Freq: Once | INTRAVENOUS | Status: AC | PRN
  Administered 2012-01-04: 1 via INTRADERMAL

## 2012-01-04 MED ORDER — 0.9 % SODIUM CHLORIDE (POUR BTL) OPTIME
TOPICAL | Status: DC | PRN
  Administered 2012-01-04: 1000 mL

## 2012-01-04 MED ORDER — HYDROMORPHONE HCL PF 1 MG/ML IJ SOLN
0.2500 mg | INTRAMUSCULAR | Status: DC | PRN
  Administered 2012-01-04: 0.25 mg via INTRAVENOUS
  Administered 2012-01-04 (×2): 0.5 mg via INTRAVENOUS

## 2012-01-04 MED ORDER — SODIUM CHLORIDE 0.9 % IJ SOLN
INTRAMUSCULAR | Status: DC | PRN
  Administered 2012-01-04: 10 mL

## 2012-01-04 MED ORDER — ROCURONIUM BROMIDE 100 MG/10ML IV SOLN
INTRAVENOUS | Status: DC | PRN
  Administered 2012-01-04: 50 mg via INTRAVENOUS

## 2012-01-04 MED ORDER — ONDANSETRON HCL 4 MG/2ML IJ SOLN
INTRAMUSCULAR | Status: DC | PRN
  Administered 2012-01-04: 4 mg via INTRAVENOUS

## 2012-01-04 MED ORDER — LACTATED RINGERS IV SOLN
INTRAVENOUS | Status: DC | PRN
  Administered 2012-01-04 (×2): via INTRAVENOUS

## 2012-01-04 MED ORDER — GLYCOPYRROLATE 0.2 MG/ML IJ SOLN
INTRAMUSCULAR | Status: DC | PRN
  Administered 2012-01-04: 0.6 mg via INTRAVENOUS

## 2012-01-04 MED ORDER — OXYCODONE-ACETAMINOPHEN 5-325 MG PO TABS
ORAL_TABLET | ORAL | Status: AC
  Filled 2012-01-04: qty 1

## 2012-01-04 MED ORDER — LACTATED RINGERS IV SOLN
INTRAVENOUS | Status: DC
  Administered 2012-01-04: 10:00:00 via INTRAVENOUS

## 2012-01-04 MED ORDER — CHLORHEXIDINE GLUCONATE 4 % EX LIQD: 1.0000 "application " | Freq: Once | CUTANEOUS | Status: DC

## 2012-01-04 MED ORDER — HYDROMORPHONE HCL PF 1 MG/ML IJ SOLN
INTRAMUSCULAR | Status: AC
  Filled 2012-01-04: qty 1

## 2012-01-04 MED ORDER — METHYLENE BLUE 1 % INJ SOLN
INTRAMUSCULAR | Status: DC | PRN
  Administered 2012-01-04: 10 mL

## 2012-01-04 SURGICAL SUPPLY — 63 items
ADH SKN CLS APL DERMABOND .7 (GAUZE/BANDAGES/DRESSINGS) ×1
APPLIER CLIP 9.375 MED OPEN (MISCELLANEOUS) ×2
APR CLP MED 9.3 20 MLT OPN (MISCELLANEOUS) ×1
BINDER BREAST LRG (GAUZE/BANDAGES/DRESSINGS) IMPLANT
BINDER BREAST XLRG (GAUZE/BANDAGES/DRESSINGS) ×2 IMPLANT
BLADE SURG 10 STRL SS (BLADE) ×2 IMPLANT
BLADE SURG 15 STRL LF DISP TIS (BLADE) ×1 IMPLANT
BLADE SURG 15 STRL SS (BLADE) ×2
CANISTER SUCTION 2500CC (MISCELLANEOUS) ×2 IMPLANT
CHLORAPREP W/TINT 26ML (MISCELLANEOUS) ×2 IMPLANT
CLIP APPLIE 9.375 MED OPEN (MISCELLANEOUS) ×1 IMPLANT
CLOTH BEACON ORANGE TIMEOUT ST (SAFETY) ×2 IMPLANT
CONT SPEC STER OR (MISCELLANEOUS) ×6 IMPLANT
COVER PROBE W GEL 5X96 (DRAPES) ×2 IMPLANT
COVER SURGICAL LIGHT HANDLE (MISCELLANEOUS) ×2 IMPLANT
DECANTER SPIKE VIAL GLASS SM (MISCELLANEOUS) ×2 IMPLANT
DERMABOND ADVANCED (GAUZE/BANDAGES/DRESSINGS) ×1
DERMABOND ADVANCED .7 DNX12 (GAUZE/BANDAGES/DRESSINGS) IMPLANT
DEVICE DUBIN SPECIMEN MAMMOGRA (MISCELLANEOUS) ×2 IMPLANT
DRAPE CHEST BREAST 15X10 FENES (DRAPES) ×2 IMPLANT
DRAPE UTILITY 15X26 W/TAPE STR (DRAPE) ×4 IMPLANT
ELECT CAUTERY BLADE 6.4 (BLADE) ×2 IMPLANT
ELECT COATED BLADE 2.86 ST (ELECTRODE) ×2 IMPLANT
ELECT REM PT RETURN 9FT ADLT (ELECTROSURGICAL) ×2
ELECTRODE REM PT RTRN 9FT ADLT (ELECTROSURGICAL) ×1 IMPLANT
GLOVE BIOGEL PI IND STRL 7.0 (GLOVE) IMPLANT
GLOVE BIOGEL PI IND STRL 8 (GLOVE) ×1 IMPLANT
GLOVE BIOGEL PI INDICATOR 7.0 (GLOVE) ×2
GLOVE BIOGEL PI INDICATOR 8 (GLOVE) ×1
GLOVE ECLIPSE 6.5 STRL STRAW (GLOVE) ×2 IMPLANT
GLOVE SS BIOGEL STRL SZ 6.5 (GLOVE) IMPLANT
GLOVE SS BIOGEL STRL SZ 7.5 (GLOVE) ×1 IMPLANT
GLOVE SUPERSENSE BIOGEL SZ 6.5 (GLOVE) ×1
GLOVE SUPERSENSE BIOGEL SZ 7.5 (GLOVE) ×2
GLOVE SURG SS PI 6.5 STRL IVOR (GLOVE) ×1 IMPLANT
GOWN PREVENTION PLUS XLARGE (GOWN DISPOSABLE) ×2 IMPLANT
GOWN STRL NON-REIN LRG LVL3 (GOWN DISPOSABLE) ×4 IMPLANT
KIT BASIN OR (CUSTOM PROCEDURE TRAY) ×2 IMPLANT
KIT MARKER MARGIN INK (KITS) ×1 IMPLANT
KIT ROOM TURNOVER OR (KITS) ×2 IMPLANT
NDL 18GX1X1/2 (RX/OR ONLY) (NEEDLE) ×1 IMPLANT
NDL HYPO 25GX1X1/2 BEV (NEEDLE) ×2 IMPLANT
NEEDLE 18GX1X1/2 (RX/OR ONLY) (NEEDLE) ×2 IMPLANT
NEEDLE HYPO 25GX1X1/2 BEV (NEEDLE) ×4 IMPLANT
NS IRRIG 1000ML POUR BTL (IV SOLUTION) ×2 IMPLANT
PACK SURGICAL SETUP 50X90 (CUSTOM PROCEDURE TRAY) ×2 IMPLANT
PAD ARMBOARD 7.5X6 YLW CONV (MISCELLANEOUS) ×2 IMPLANT
PENCIL BUTTON HOLSTER BLD 10FT (ELECTRODE) ×2 IMPLANT
SPONGE LAP 18X18 X RAY DECT (DISPOSABLE) ×2 IMPLANT
STAPLER VISISTAT 35W (STAPLE) ×2 IMPLANT
STOCKINETTE IMPERVIOUS 9X36 MD (GAUZE/BANDAGES/DRESSINGS) IMPLANT
SUT MNCRL AB 4-0 PS2 18 (SUTURE) ×3 IMPLANT
SUT SILK 2 0 SH (SUTURE) ×1 IMPLANT
SUT VIC AB 2-0 SH 27 (SUTURE) ×2
SUT VIC AB 2-0 SH 27XBRD (SUTURE) ×2 IMPLANT
SUT VIC AB 3-0 SH 27 (SUTURE) ×2
SUT VIC AB 3-0 SH 27XBRD (SUTURE) ×2 IMPLANT
SYR BULB 3OZ (MISCELLANEOUS) ×1 IMPLANT
SYR CONTROL 10ML LL (SYRINGE) ×4 IMPLANT
TOWEL OR 17X24 6PK STRL BLUE (TOWEL DISPOSABLE) ×1 IMPLANT
TOWEL OR 17X26 10 PK STRL BLUE (TOWEL DISPOSABLE) ×2 IMPLANT
TUBE CONNECTING 12X1/4 (SUCTIONS) ×2 IMPLANT
YANKAUER SUCT BULB TIP NO VENT (SUCTIONS) ×2 IMPLANT

## 2012-01-04 NOTE — Progress Notes (Signed)
Dr. Chaney Malling contacted regarding the patients complaint of numbness in her right arm.  No block was used in that arm.  Per Dr. Chaney Malling, will monitor that arm and if it does not resolve, will notify him.

## 2012-01-04 NOTE — Op Note (Signed)
Preoperative Diagnosis: breast cancer right  Postoprative Diagnosis: breast cancer right  Procedure: Procedure(s): BREAST LUMPECTOMY WITH NEEDLE LOCALIZATION AND AXILLARY SENTINEL LYMPH NODE BX, blue dye injection   Surgeon: Glenna Fellows T   Assistants: none  Anesthesia:  General LMA anesthesiaDiagnos  Indications:   The patient is a 51 year old female with a new diagnosis of invasive ductal carcinoma of the upper-outer quadrant of the right breast 1.7 cm in size, clinical T1 C. N0 M0. We have discussed initial surgical treatment options and elected to proceed with breast conservation with needle localized lumpectomy and axillary sentinel lymph node biopsy. She has undergone injection of 1 mCi of technetium sulfur colloid preoperatively. She has undergone needle localization.  Procedure Detail: Patient was brought to the operating room and general anesthesia induced. The right breast and axilla were widely sterilely prepped and draped after under sterile technique injecting 5 cc of dilute methylene blue subcutaneously under the right nipple and massaging. Patient timeout was performed and correct procedure verified. The lumpectomy was approached initially. I made a curvilinear incision in the upper-outer quadrant near the wire insertion site and dissection was carried down into the deep subcutaneous tissue. The dissection was then brought in and out and the wire encountered and brought into the incision. I then excised a generous portion of breast tissue surrounding the shaft and the tip of the wire using cautery. The specimen was removed and oriented with ink. Specimen mammography was obtained which showed the clip and wire in the center of the specimen although the lateral margin appeared relatively more close than the others. I excised further lateral margin as a permanent specimen which was oriented and sent separately. Attention was then turned to the sentinel lymph node biopsy. I was  able to expose the axilla nicely through her lumpectomy incision which was relatively close to the axilla. A hot area was localized and dissection carried down into the axilla proper with cautery and blunt dissection. I did not no blue dye but found a hot area in the axilla and a small specimen of fibrofatty tissue was elevated and excised with cautery. There was a very small lymph node within it which I was able to dissect out it had counts in excess of 4000. This was marked with a suture and sent for permanent section. Background in the axilla was still about 500 in with fairly extensive further search was able to dissect out a very small node 3 posterior and high in the axilla with high counts in it was excised. It had ex vivo counts of about 1000 and background in the axilla this point was less than 30. It was sent as a separate specimen. Following this the tissue was infiltrated extensively with local anesthesia. The wound was thoroughly irrigated and hemostasis assured. The lumpectomy cavity was marked with multiple clips. Complete hemostasis was assured. The deep tissue and subcutaneous tissue were closed in layers with interrupted 3-0 Vicryl. The skin was closed with subcuticular 4-0 Monocryl and Dermabond. Sponge needle and instrument counts were correct.   Estimated Blood Loss:  Minimal         Drains: none  Blood Given: none          Specimens: #1 right breast tissue #2 further lateral margin right breast tissue #3 right axillary sentinel lymph node #4 additional right axillary sentinel lymph node        Complications:  * No complications entered in OR log *  Disposition: PACU - hemodynamically stable.         Condition: stable   Mariella Saa MD, FACS  01/04/2012, 12:13 PM

## 2012-01-04 NOTE — Interval H&P Note (Signed)
History and Physical Interval Note:  01/04/2012 10:02 AM  Lori Mcmillan  has presented today for surgery, with the diagnosis of breast cancer  The various methods of treatment have been discussed with the patient and family. After consideration of risks, benefits and other options for treatment, the patient has consented to  Procedure(s) (LRB): BREAST LUMPECTOMY WITH NEEDLE LOCALIZATION AND AXILLARY SENTINEL LYMPH NODE BX (Right) as a surgical intervention .  The patients' history has been reviewed, patient examined, no change in status, stable for surgery.  I have reviewed the patients' chart and labs.  Questions were answered to the patient's satisfaction.     Aireal Slater T

## 2012-01-04 NOTE — Progress Notes (Signed)
[facility] [phone] ADVISED OF PTS ARRIVAL.

## 2012-01-04 NOTE — Anesthesia Preprocedure Evaluation (Addendum)
Anesthesia Evaluation  Patient identified by MRN, date of birth, ID band Patient awake    Reviewed: Allergy & Precautions, H&P , NPO status , Patient's Chart, lab work & pertinent test results  Airway Mallampati: II  Neck ROM: full    Dental  (+) Dental Advisory Given and Teeth Intact   Pulmonary Current Smoker,          Cardiovascular Rhythm:Regular Rate:Normal     Neuro/Psych PSYCHIATRIC DISORDERS Anxiety Depression    GI/Hepatic   Endo/Other    Renal/GU      Musculoskeletal   Abdominal   Peds  Hematology   Anesthesia Other Findings   Reproductive/Obstetrics                          Anesthesia Physical Anesthesia Plan  ASA: II  Anesthesia Plan: General   Post-op Pain Management:    Induction: Intravenous  Airway Management Planned: Oral ETT  Additional Equipment:   Intra-op Plan:   Post-operative Plan: Extubation in OR  Informed Consent: I have reviewed the patients History and Physical, chart, labs and discussed the procedure including the risks, benefits and alternatives for the proposed anesthesia with the patient or authorized representative who has indicated his/her understanding and acceptance.     Plan Discussed with: CRNA and Surgeon  Anesthesia Plan Comments:         Anesthesia Quick Evaluation

## 2012-01-04 NOTE — Preoperative (Signed)
Beta Blockers   Reason not to administer Beta Blockers:Not Applicable 

## 2012-01-04 NOTE — Discharge Instructions (Signed)
[facility] Office Phone Number [phone]  BREAST BIOPSY/ PARTIAL MASTECTOMY: POST OP INSTRUCTIONS  Always review your discharge instruction sheet given to you by the facility where your surgery was performed.  IF YOU HAVE DISABILITY OR FAMILY LEAVE FORMS, YOU MUST BRING THEM TO THE OFFICE FOR PROCESSING.  DO NOT GIVE THEM TO YOUR DOCTOR.  1. A prescription for pain medication may be given to you upon discharge.  Take your pain medication as prescribed, if needed.  If narcotic pain medicine is not needed, then you may take acetaminophen (Tylenol) or ibuprofen (Advil) as needed. 2. Take your usually prescribed medications unless otherwise directed 3. If you need a refill on your pain medication, please contact your pharmacy.  They will contact our office to request authorization.  Prescriptions will not be filled after 5pm or on week-ends. 4. You should eat very light the first 24 hours after surgery, such as soup, crackers, pudding, etc.  Resume your normal diet the day after surgery. 5. Most patients will experience some swelling and bruising in the breast.  Ice packs and a good support bra will help.  Swelling and bruising can take several days to resolve.  6. It is common to experience some constipation if taking pain medication after surgery.  Increasing fluid intake and taking a stool softener will usually help or prevent this problem from occurring.  A mild laxative (Milk of Magnesia or Miralax) should be taken according to package directions if there are no bowel movements after 48 hours. 7. Unless discharge instructions indicate otherwise, you may remove your bandages 24-48 hours after surgery, and you may shower at that time.  You may have steri-strips (small skin tapes) in place directly over the incision.  These strips should be left on the skin for 7-10 days.  If your surgeon used skin glue on the incision, you may shower in 24 hours.  The glue will flake off over the  next 2-3 weeks.  Any sutures or staples will be removed at the office during your follow-up visit. 8. ACTIVITIES:  You may resume regular daily activities (gradually increasing) beginning the next day.  Wearing a good support bra or sports bra minimizes pain and swelling.  You may have sexual intercourse when it is comfortable. a. You may drive when you no longer are taking prescription pain medication, you can comfortably wear a seatbelt, and you can safely maneuver your car and apply brakes. b. RETURN TO WORK:  ______________________________________________________________________________________ 9. You should see your doctor in the office for a follow-up appointment approximately two weeks after your surgery.  Your doctor's nurse will typically make your follow-up appointment when she calls you with your pathology report.  Expect your pathology report 2-3 business days after your surgery.  You may call to check if you do not hear from us after three days. 10. OTHER INSTRUCTIONS: _______________________________________________________________________________________________ _____________________________________________________________________________________________________________________________________ _____________________________________________________________________________________________________________________________________ _____________________________________________________________________________________________________________________________________  WHEN TO CALL YOUR DOCTOR: 1. Fever over 101.0 2. Nausea and/or vomiting. 3. Extreme swelling or bruising. 4. Continued bleeding from incision. 5. Increased pain, redness, or drainage from the incision.  The clinic staff is available to answer your questions during regular business hours.  Please don't hesitate to call and ask to speak to one of the nurses for clinical concerns.  If you have a medical emergency, go to the nearest  emergency room or call 911.  A surgeon from [facility] is always on call at the hospital.  For further questions, please visit [email or web address]       Instructions Following General Anesthetic, Adult A nurse specialized in giving anesthesia (anesthetist) or a doctor specialized in giving anesthesia (anesthesiologist) gave you a medicine that made you sleep while a procedure was performed. For as long as 24 hours following this procedure, you may feel:  Dizzy.   Weak.   Drowsy.  AFTER THE PROCEDURE After surgery, you will be taken to the recovery area where a nurse will monitor your progress. You will be allowed to go home when you are awake, stable, taking fluids well, and without complications. For the first 24 hours following an anesthetic:  Have a responsible person with you.   Do not drive a car. If you are alone, do not take public transportation.   Do not drink alcohol.   Do not take medicine that has not been prescribed by your caregiver.   Do not sign important papers or make important decisions.   You may resume normal diet and activities as directed.   Change bandages (dressings) as directed.   Only take over-the-counter or prescription medicines for pain, discomfort, or fever as directed by your caregiver.  If you have questions or problems that seem related to the anesthetic, call the hospital and ask for the anesthetist or anesthesiologist on call. SEEK IMMEDIATE MEDICAL CARE IF:   You develop a rash.   You have difficulty breathing.   You have chest pain.   You develop any allergic problems.  Document Released: 01/10/2001 Document Revised: 09/23/2011 Document Reviewed: 08/21/2007 [employer] Patient Information 2012 [employer].   

## 2012-01-04 NOTE — Transfer of Care (Signed)
Immediate Anesthesia Transfer of Care Note  Patient: Lori Mcmillan  Procedure(s) Performed: Procedure(s) (LRB): BREAST LUMPECTOMY WITH NEEDLE LOCALIZATION AND AXILLARY SENTINEL LYMPH NODE BX (Right)  Patient Location: PACU  Anesthesia Type: General  Level of Consciousness: awake, alert , oriented and patient cooperative  Airway & Oxygen Therapy: Patient Spontanous Breathing and Patient connected to nasal cannula oxygen  Post-op Assessment: Report given to PACU RN, Post -op Vital signs reviewed and stable and Patient moving all extremities  Post vital signs: Reviewed and stable  Complications: No apparent anesthesia complications

## 2012-01-04 NOTE — Anesthesia Postprocedure Evaluation (Signed)
Anesthesia Post Note  Patient: Lori Mcmillan  Procedure(s) Performed: Procedure(s) (LRB): BREAST LUMPECTOMY WITH NEEDLE LOCALIZATION AND AXILLARY SENTINEL LYMPH NODE BX (Right)  Anesthesia type: General  Patient location: PACU  Post pain: Pain level controlled and Adequate analgesia  Post assessment: Post-op Vital signs reviewed, Patient's Cardiovascular Status Stable, Respiratory Function Stable, Patent Airway and Pain level controlled  Last Vitals:  Filed Vitals:   01/04/12 1330  BP:   Pulse: 74  Temp:   Resp: 14    Post vital signs: Reviewed and stable  Level of consciousness: awake, alert  and oriented  Complications: No apparent anesthesia complications

## 2012-01-04 NOTE — H&P (View-Only) (Signed)
Subjective:   new diagnosis cancer of right breast  Patient ID: Lori Mcmillan, female   DOB: May 15, 1961, 51 y.o.   MRN: [id number]  HPI The patient is a very pleasant 51 year old female referred through the courtesy of [facility] for a new diagnosis of cancer of the right breast. She has a very significant family history of breast cancer including her mother at age 58 and maternal aunt at age 24 and maternal aunt at age 90 as well as several cousins and her grandmother. For this reason she has had screening exam since age 34. She has no previous history of a suspicious abnormality or breast biopsy. However her recent screening mammogram revealed an area of asymmetry in the upper outer right breast at the 11:00 position. Subsequent ultrasound showed an irregular hypoechoic 1.7 cm mass. Large core needle biopsy was recommended and performed. 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 It is low-grade and ER/PR positive. The majority of the lesion is Dr. Cristela Felt in situ with a small area of invasion. Subsequent MRI confirmed a 1.7 cm mass at the site of the known malignancy with no other abnormality seen. She is seen today in the multidisciplinary breast clinic for treatment planning. She again has not noted any breast mass her pain, skin changes or nipple discharge.  Past Medical History  Diagnosis Date  . Palpitations   . Anxiety    Past Surgical History  Procedure Date  . Appendectomy    Current Outpatient Prescriptions  Medication Sig Dispense Refill  . LORazepam (ATIVAN) 1 MG tablet Take 1 mg by mouth every 8 (eight) hours.      . metoprolol (LOPRESSOR) 50 MG tablet Take 50 mg by mouth 2 (two) times daily.      . Nutritional Supplements (ESTROVEN EXTRA STRENGTH PO) Take by mouth.      . sertraline (ZOLOFT) 100 MG tablet Take 100 mg by mouth daily.       No Known Allergies History  Substance Use Topics  . Smoking status: Current Some  Day Smoker -- 0.1 packs/day  . Smokeless tobacco: Not on file  . Alcohol Use: 7.2 oz/week    12 Glasses of wine per week    Review of Systems  Constitutional: Negative.   HENT: Negative.   Respiratory: Negative.   Cardiovascular: Negative.   Gastrointestinal: Negative.   Musculoskeletal: Negative.        Objective:   Physical Exam General: Alert, mildly obese Caucasian female, in no distress Skin: Warm and dry without rash or infection. HEENT: No palpable masses or thyromegaly. Sclera nonicteric. Pupils equal round and reactive. Oropharynx clear. Lymph nodes: No cervical, supraclavicular, or inguinal nodes palpable. Breasts: Slight bruising upper-outer right breast post biopsy. I cannot feel any masses in either breast and again no palpable adenopathy Lungs: Breath sounds clear and equal without increased work of breathing Cardiovascular: Regular rate and rhythm without murmur. No JVD or edema. Peripheral pulses intact. Abdomen: Nondistended. Soft and nontender. No masses palpable. No organomegaly. No palpable hernias. Extremities: No edema or joint swelling or deformity. No chronic venous stasis changes. Neurologic: Alert and fully oriented. Gait normal.     Assessment:     51 year old female with new diagnosis of a 1.7 cm in situ and invasive cancer of the upper outer quadrant of the right breast. We discussed initial surgical treatment options in detail. She has a strong family history of breast cancer and genetic testing is certainly indicated and  is underway. She would prefer breast conservation I believe would be a good candidate. We discussed options of mastectomy. We discussed needle localized right breast lumpectomy and axillary sentinel lymph node biopsy. We discussed the nature of the surgery, indications, recovery, and risks of bleeding, infection, anesthetic risks, and possible need for further surgery based on final pathology findings. She understands that radiation is  required. Hormonal versus chemotherapy would depend on her node status and Oncotype. We will tentatively plan breast conservation surgery pending her genetic evaluation. We discussed that prophylactic mastectomy would not increase her chance for survival but she may prefer this if she is positive. We will schedule the surgery out far enough that this result will be available a head of time.    Plan:     Right breast needle localized lumpectomy and axillary sentinel lymph node biopsy as an outpatient under general anesthesia. Prior to scheduling the surgery and we'll make sure that her genetic testing his back as this may influence her surgical decision-making and therefore we will schedule her surgery out about 3 weeks.

## 2012-01-06 ENCOUNTER — Encounter: Payer: Self-pay | Admitting: *Deleted

## 2012-01-06 NOTE — Progress Notes (Signed)
Ordered Oncotype Dx test w/ [facility].  Faxed request to [facility].  Faxed PAC to [facility]. 

## 2012-01-07 ENCOUNTER — Telehealth (INDEPENDENT_AMBULATORY_CARE_PROVIDER_SITE_OTHER): Payer: Self-pay

## 2012-01-07 NOTE — Telephone Encounter (Signed)
Pt called stating she needs reassurance that a small spot of dry blood at the end of her incision is normal. Pt states no fresh bleeding occuring. Pt advised it is not unusual to have a small amount of blood at the incision for the first few days after surgery. Pt advised she may shower and gently clean area but not to scrub or rub area. Pt advised if active bleeding starts or if wound appears to open she is to call our office to let her MD know. Pt states she understands.

## 2012-01-10 ENCOUNTER — Telehealth (INDEPENDENT_AMBULATORY_CARE_PROVIDER_SITE_OTHER): Payer: Self-pay | Admitting: General Surgery

## 2012-01-10 NOTE — Telephone Encounter (Signed)
Discussed path report with patient

## 2012-01-14 ENCOUNTER — Ambulatory Visit: Payer: Managed Care, Other (non HMO) | Admitting: Oncology

## 2012-01-20 ENCOUNTER — Ambulatory Visit (INDEPENDENT_AMBULATORY_CARE_PROVIDER_SITE_OTHER): Payer: Managed Care, Other (non HMO) | Admitting: General Surgery

## 2012-01-20 ENCOUNTER — Encounter (INDEPENDENT_AMBULATORY_CARE_PROVIDER_SITE_OTHER): Payer: Self-pay | Admitting: General Surgery

## 2012-01-20 VITALS — BP 118/84 | HR 76 | Temp 98.8°F | Resp 16 | Ht 63.5 in | Wt 189.6 lb

## 2012-01-20 DIAGNOSIS — C50419 Malignant neoplasm of upper-outer quadrant of unspecified female breast: Secondary | ICD-10-CM

## 2012-01-20 NOTE — Progress Notes (Signed)
History: Patient returns for her first postoperative visit following right breast lumpectomy and sentinel lymph node biopsy. She is getting along well without complaints.  We had previously reviewed her pathology that showed widely negative margins of 7 mm and her sentinel node is negative. She is pathologic T1 CN0  On exam her wound is healing nicely without complication.  Assessment and plan: Doing well following lumpectomy and sentinel node biopsy. She will be seen in radiation and medical oncology in the next week or so. I will see her back in 4 months.

## 2012-01-21 ENCOUNTER — Encounter: Payer: Self-pay | Admitting: *Deleted

## 2012-01-21 NOTE — Progress Notes (Signed)
Received Oncotype score of 7.  Place report on Dr. Milta Deiters desk to review.

## 2012-01-24 ENCOUNTER — Ambulatory Visit (HOSPITAL_BASED_OUTPATIENT_CLINIC_OR_DEPARTMENT_OTHER): Payer: Managed Care, Other (non HMO) | Admitting: Oncology

## 2012-01-24 ENCOUNTER — Encounter: Payer: Self-pay | Admitting: Oncology

## 2012-01-24 ENCOUNTER — Telehealth: Payer: Self-pay | Admitting: *Deleted

## 2012-01-24 VITALS — BP 139/79 | HR 73 | Temp 98.4°F | Ht 63.5 in | Wt 191.3 lb

## 2012-01-24 DIAGNOSIS — F32A Depression, unspecified: Secondary | ICD-10-CM

## 2012-01-24 DIAGNOSIS — Z170 Estrogen receptor positive status [ER+]: Secondary | ICD-10-CM

## 2012-01-24 DIAGNOSIS — F3289 Other specified depressive episodes: Secondary | ICD-10-CM

## 2012-01-24 DIAGNOSIS — F329 Major depressive disorder, single episode, unspecified: Secondary | ICD-10-CM

## 2012-01-24 DIAGNOSIS — C50419 Malignant neoplasm of upper-outer quadrant of unspecified female breast: Secondary | ICD-10-CM

## 2012-01-24 MED ORDER — SERTRALINE HCL 50 MG PO TABS: 50.0000 mg | ORAL_TABLET | Freq: Every day | ORAL | Status: DC

## 2012-01-24 MED ORDER — SERTRALINE HCL 25 MG PO TABS: 25.0000 mg | ORAL_TABLET | Freq: Every day | ORAL | Status: DC

## 2012-01-24 NOTE — Patient Instructions (Signed)
1. Keep your appointment with Dr. Michell Heinrich  2. Begin to taper the zoloft as follows:  1. Begin 75 mg po daily for now  3. I will see you back on 4/24 for a follow up and decide on another taper of the zoloft.

## 2012-01-24 NOTE — Telephone Encounter (Signed)
gave patient appointment for 02-09-2012 at 4:00pm with dr. Welton Flakes after the patient see dr. Michell Heinrich that day printed out calendar and gave to the patient

## 2012-01-24 NOTE — Progress Notes (Signed)
OFFICE PROGRESS NOTE  [facility]  [place] [place] [place] [address] [place] [address] Dr. Glenna Fellows Dr. 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  PRIOR THERAPY:  #1 patient is status post right lumpectomy on 01/04/2012. 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 2 sentinel nodes were negative for metastatic disease. Tumor was grade 1. 2 sentinel nodes were negative for metastatic disease tumor was estrogen receptor positive progesterone receptor +100% HER-2/neu was not amplified with a ratio of 1.30. Proliferation marker 5%.  #2 patient is scheduled to be seen by Dr. Lurline Hare on 02/09/2012 for discussion of post lumpectomy radiation.  #3 patient had Oncotype DX performed and the recurrence score was 7 giving her a 6% 5 year distance recurrence with tamoxifen  CURRENT THERAPY:Patient will be seen by Dr. Michell Heinrich for discussion of post lumpectomy radiation therapy.  INTERVAL HISTORY: Lori Mcmillan 51 y.o. female returns for Followup visit after having had her surgery. Overall she is doing well she tolerated this lumpectomy quite well she is healed very nicely and has had a good cosmetic result. Clinically she seems to be doing well she denies any fevers chills night sweats headaches shortness of breath chest pains palpitations she has no myalgias or arthralgias. Patient currently is on Zoloft for anxiety/depression. This has truly helped her quite a bit. She has been on Zoloft for the last 3 years. Most recently in December 2012 she had an increase in the dose 200 mg on a daily basis and she feels that this really has helped her control her emotions and overall she seems to be a happy  person. Remainder of the 10 point review of systems is negative.  MEDICAL HISTORY: Past Medical History  Diagnosis Date  . Palpitations     takes Metoprolol for palpitations  . Anxiety   . Depression   . Cancer     Rt breast    ALLERGIES:   has no known allergies.  MEDICATIONS:  Current Outpatient Prescriptions  Medication Sig Dispense Refill  . LORazepam (ATIVAN) 1 MG tablet Take 1 mg by mouth every 8 (eight) hours as needed. For anxiety      . metoprolol (LOPRESSOR) 50 MG tablet Take 50 mg by mouth daily.       Marland Kitchen omeprazole (PRILOSEC) 20 MG capsule Take 20 mg by mouth daily as needed. For acid reflux      . sertraline (ZOLOFT) 100 MG tablet Take 100 mg by mouth daily.      Marland Kitchen oxyCODONE-acetaminophen (PERCOCET) 5-325 MG per tablet daily.      . sertraline (ZOLOFT) 25 MG tablet Take 1 tablet (25 mg total) by mouth daily.  30 tablet  2  . sertraline (ZOLOFT) 50 MG tablet Take 1 tablet (50 mg total) by mouth daily.  30 tablet  2    SURGICAL HISTORY:  Past Surgical History  Procedure Date  . Wisdom tooth extraction 1990  . Appendectomy 2008    emergency  . Breast lumpectomy 01/04/12    right breast    REVIEW OF SYSTEMS:  Pertinent items are noted in HPI.   PHYSICAL EXAMINATION: General appearance: alert, cooperative and appears stated age Neck: no adenopathy, no carotid bruit, no JVD, supple, symmetrical, trachea  midline and thyroid not enlarged, symmetric, no tenderness/mass/nodules Lymph nodes: Cervical, supraclavicular, and axillary nodes normal. Resp: clear to auscultation bilaterally and normal percussion bilaterally Back: symmetric, no curvature. ROM normal. No CVA tenderness. Cardio: regular rate and rhythm, S1, S2 normal, no murmur, click, rub or gallop GI: soft, non-tender; bowel sounds normal; no masses,  no organomegaly Extremities: extremities normal, atraumatic, no cyanosis or edema Neurologic: Alert and oriented X 3, normal strength and tone. Normal symmetric  reflexes. Normal coordination and gait Bilateral breast examination: Right breast reveals a very well-healed surgical scar there is no redness there is slight tenderness the breast is somewhat swollen but there is no other skin changes no nipple discharge no masses. Left breast no masses nipple discharge. ECOG PERFORMANCE STATUS: 0 - Asymptomatic  Blood pressure 139/79, pulse 73, temperature 98.4 F (36.9 C), temperature source Oral, height 5' 3.5" (1.613 m), weight 191 lb 4.8 oz (86.773 kg), last menstrual period 11/15/2011.  LABORATORY DATA: Lab Results  Component Value Date   WBC 14.4* 12/31/2011   HGB 12.5 12/31/2011   HCT 38.0 12/31/2011   MCV 90.5 12/31/2011   PLT 326 12/31/2011      Chemistry      Component Value Date/Time   NA 137 01/04/2012 0843   K 4.1 01/04/2012 0843   CL 103 01/04/2012 0843   CO2 24 01/04/2012 0843   BUN 12 01/04/2012 0843   CREATININE 0.70 01/04/2012 0843      Component Value Date/Time   CALCIUM 8.9 01/04/2012 0843   ALKPHOS 116 12/08/2011 0819   AST 26 12/08/2011 0819   ALT 34 12/08/2011 0819   BILITOT 0.1* 12/08/2011 0819     ADDITIONAL INFORMATION: 1. CHROMOGENIC IN-SITU HYBRIDIZATION Interpretation HER-2/NEU BY CISH - NO AMPLIFICATION OF HER-2 DETECTED. THE RATIO OF HER-2: CEP 17 SIGNALS WAS 1.30. Reference range: Ratio: HER2:CEP17 < 1.8 - gene amplification not observed Ratio: HER2:CEP 17 1.8-2.2 - equivocal result Ratio: HER2:CEP17 > 2.2 - gene amplification observed Abigail Miyamoto MD Pathologist, Electronic Signature ( Signed 01/11/2012) FINAL DIAGNOSIS Diagnosis 1. Breast, lumpectomy, Right - INVASIVE WELL-DIFFERENTIATED LOW GRADE DUCTAL CARCINOMA, SEE COMMENT. - NO LYMPHOVASCULAR INVASION IDENTIFIED. - INVASIVE TUMOR IS 0.7 CM FROM NEAREST MARGIN (LATERAL). - DUCTAL CARCINOMA IN SITU. - SEE TUMOR SYMPTOTIC TEMPLATE BELOW. 2. Lymph node, sentinel, biopsy, Right axillary - ONE LYMPH NODE, NEGATIVE FOR TUMOR (0/1). 3. Lymph node, sentinel,  biopsy, Right axillary - ONE LYMPH NODE, NEGATIVE FOR TUMOR (0/1). 4. Breast, excision, Additional lateral - BENIGN BREAST PARENCHYMA WITH FIBROCYSTIC CHANGE, SEE COMMENT. - NO ATYPIA OR MALIGNANCY PRESENT. 1 of 3 FINAL for Lori Mcmillan, Lori Mcmillan [phone]) Microscopic Comment 1. BREAST, INVASIVE TUMOR, WITH LYMPH NODE SAMPLING Specimen, including laterality: Right breast Procedure: Lumpectomy Grade: I of III Tubule formation: 1 Nuclear pleomorphism: 1 Mitotic:1 Tumor size (gross measurement): 1.5 cm Margins: Invasive, distance to closest margin: 0.7 In-situ, distance to closest margin: 0.7 If margin positive, focally or broadly: N/A Lymphovascular invasion: Absent Ductal carcinoma in situ: Present Grade: 1 of 3 Extensive intraductal component: Absent Lobular neoplasia: Absent Tumor focality: Unifocal Treatment effect: None If present, treatment effect in breast tissue, lymph nodes or both: N/A Extent of tumor: Skin: N/A Nipple: N/A Skeletal muscle: N/A Lymph nodes: # examined: 2 Lymph nodes with metastasis:0 Breast prognostic profile: Estrogen receptor: Not repeated Progesterone receptor: Not repeated, previous study demonstrated 100% positivity ([id number]) Her 2 neu: Repeated, previous study demonstrated equivocal range for amplification (2.07) ([id number]) Ki-67: Not repeated, previous study demonstrated 5% proliferation  rate Non-neoplastic breast: Previous biopsy site TNM: pT1c pN0 Comments: The invasive tumor is deceptively admixed with the in situ carcinoma. The absence of the myoepithelial layer is confirmed with no expression of smooth muscle myosin heavy chain. (CR:mw 01-06-12) 4. The surgical resection margin(s) of the specimen were inked and microscopically evaluated. (CR:mw 01-05-12) [place] RUND DO Pathologist, Electronic Signature  RADIOGRAPHIC STUDIES:  Nm Sentinel Node Inj-no Rpt (breast)  01/04/2012  CLINICAL DATA: cancer right breast   Sulfur colloid  was injected intradermally by the nuclear medicine  technologist for breast cancer sentinel node localization.     Mm Breast Surgical Specimen  01/04/2012  *RADIOLOGY REPORT*  Clinical Data:  Recent diagnosis of invasive ductal carcinoma in the right upper outer quadrant posteriorly.  RIGHT BREAST NEEDLE LOCALIZATION WITH MAMMOGRAPHIC GUIDANCE AND SPECIMEN RADIOGRAPH  Patient presents for needle localization prior to surgical excision. The patient and I discussed the procedure of needle localization including benefits and alternatives. We discussed the high likelihood of a successful procedure. We discussed the risks of the procedure, including infection, bleeding, tissue injury and further surgery. Informed written consent was given.  Using mammographic guidance, sterile technique, 2% lidocaine and a 7 cm modified Kopans needle, the clip and mass in the  right upper outer quadrant posteriorly was localized using a lateromedial approach.  Films were labeled and sent with the patient to surgery. She tolerated the procedure well.  Specimen radiograph was performed at [facility] operating room and confirms the mass, clip and wire to be present in the tissue sample.  The specimen is marked for pathology.  IMPRESSION: Needle localization right breast.  No apparent complications.  Original Report Authenticated By: Daryl Eastern, M.D.   Mm Breast Wire Localization Right  01/04/2012  *RADIOLOGY REPORT*  Clinical Data:  Recent diagnosis of invasive ductal carcinoma in the right upper outer quadrant posteriorly.  RIGHT BREAST NEEDLE LOCALIZATION WITH MAMMOGRAPHIC GUIDANCE AND SPECIMEN RADIOGRAPH  Patient presents for needle localization prior to surgical excision. The patient and I discussed the procedure of needle localization including benefits and alternatives. We discussed the high likelihood of a successful procedure. We discussed the risks of the procedure, including infection, bleeding, tissue  injury and further surgery. Informed written consent was given.  Using mammographic guidance, sterile technique, 2% lidocaine and a 7 cm modified Kopans needle, the clip and mass in the  right upper outer quadrant posteriorly was localized using a lateromedial approach.  Films were labeled and sent with the patient to surgery. She tolerated the procedure well.  Specimen radiograph was performed at [facility] operating room and confirms the mass, clip and wire to be present in the tissue sample.  The specimen is marked for pathology.  IMPRESSION: Needle localization right breast.  No apparent complications.  Original Report Authenticated By: Daryl Eastern, M.D.    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 Patient is status post lumpectomy with 2 sentinel nodes negative for metastatic disease.  #2 anxiety/depression patient is currently on Zoloft for this 100 mg on a daily basis.   PLAN:   #1 patient and I discussed her pathology in detail we also discussed Oncotype DX. I went through the results on Oncotype DX. She understands that she has a 6% risk of distant recurrence with 5 years of tamoxifen which is a low risk. I have therefore  recommended that we treat her with tamoxifen adjuvantly. She would not benefit with chemotherapy at all. However she also understands that Zoloft and tamoxifen may have drug drug interaction.  #2 I have recommended that patient begin a taper from the Zoloft and is scheduled for a taper was given. Currently she is on 100 mg on a daily basis I have recommended that she begin 75 mg daily starting tomorrow. She will continue this and I will plan seeing her back on April 24.  #3 patient understands that because she has had a lumpectomy she will receive radiation therapy. She is already scheduled to be  seen by Dr. Michell Heinrich for this for her discussion. To me it seems as if she patient is a little reluctant but she certainly is willing to listen for the rationale of why she needs radiation. We briefly did touch upon this Today   All questions were answered. The patient knows to call the clinic with any problems, questions or concerns. We can certainly see the patient much sooner if necessary.  I spent 30 minutes counseling the patient face to face. The total time spent in the appointment was 30 minutes.    Drue Second, MD Medical/Oncology [facility] [phone] (beeper) [phone] (Office)  01/24/2012, 4:55 PM

## 2012-02-09 ENCOUNTER — Ambulatory Visit
Admission: RE | Admit: 2012-02-09 | Discharge: 2012-02-09 | Disposition: A | Discharge status: Home / Self Care | Payer: Managed Care, Other (non HMO) | Source: Ambulatory Visit | Attending: Radiation Oncology | Admitting: Radiation Oncology

## 2012-02-09 ENCOUNTER — Encounter: Payer: Self-pay | Admitting: Oncology

## 2012-02-09 ENCOUNTER — Ambulatory Visit (HOSPITAL_BASED_OUTPATIENT_CLINIC_OR_DEPARTMENT_OTHER): Payer: Managed Care, Other (non HMO) | Admitting: Oncology

## 2012-02-09 ENCOUNTER — Encounter: Payer: Self-pay | Admitting: Radiation Oncology

## 2012-02-09 VITALS — BP 140/82 | HR 90 | Temp 97.7°F | Resp 18 | Ht 63.5 in | Wt 191.6 lb

## 2012-02-09 VITALS — BP 128/83 | HR 80 | Temp 98.2°F | Ht 63.5 in | Wt 191.3 lb

## 2012-02-09 DIAGNOSIS — C50919 Malignant neoplasm of unspecified site of unspecified female breast: Secondary | ICD-10-CM | POA: Insufficient documentation

## 2012-02-09 DIAGNOSIS — F419 Anxiety disorder, unspecified: Secondary | ICD-10-CM

## 2012-02-09 DIAGNOSIS — Z510 Encounter for antineoplastic radiation therapy: Secondary | ICD-10-CM | POA: Insufficient documentation

## 2012-02-09 DIAGNOSIS — C50419 Malignant neoplasm of upper-outer quadrant of unspecified female breast: Secondary | ICD-10-CM

## 2012-02-09 DIAGNOSIS — F411 Generalized anxiety disorder: Secondary | ICD-10-CM

## 2012-02-09 MED ORDER — VENLAFAXINE HCL ER 37.5 MG PO CP24: 37.5000 mg | ORAL_CAPSULE | Freq: Every day | ORAL | Status: DC

## 2012-02-09 NOTE — Progress Notes (Signed)
OFFICE PROGRESS NOTE  CC  [place], PA, PA [address] [place] [address] Dr. Glenna Fellows Dr. 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  PRIOR THERAPY:  #1 patient is status post right lumpectomy on 01/04/2012. 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 2 sentinel nodes were negative for metastatic disease. Tumor was grade 1. 2 sentinel nodes were negative for metastatic disease tumor was estrogen receptor positive progesterone receptor +100% HER-2/neu was not amplified with a ratio of 1.30. Proliferation marker 5%.  #2 patient is scheduled to be seen by Dr. Lurline Hare on 02/09/2012 for discussion of post lumpectomy radiation.  #3 patient had Oncotype DX performed and the recurrence score was 7 giving her a 6% 5 year distance recurrence with tamoxifen  CURRENT THERAPY:Patient will be seen by Dr. Michell Heinrich for discussion of post lumpectomy radiation therapy.  INTERVAL HISTORY: Lori Mcmillan 51 y.o. female returns for Followup visit  Patient has begun taper of Zoloft she is currently on 75 mg on a daily basis unfortunately she is quite irritated and she does not feel herself. She is very agitated as well and she is very concerned about this situation. She does not feel that she can come off of the Zoloft completely and then go on something else. We had an extensive discussion about this. I have therefore made to recommendations as below. She understands the risks and benefits of this. She was seen by Dr. Michell Heinrich and they are doing postlumpectomy radiation therapy but it will begin on May 6. She is going to have simulation as done next week sometime. Otherwise patient is doing well  at the 10 point review of systems is negative.  MEDICAL HISTORY: Past Medical History  Diagnosis Date  . Palpitations     takes Metoprolol for palpitations  . Anxiety   . Depression   . Cancer     Rt breast    ALLERGIES:   has no known allergies.  MEDICATIONS:  Current Outpatient Prescriptions  Medication Sig Dispense Refill  . LORazepam (ATIVAN) 1 MG tablet Take 1 mg by mouth every 8 (eight) hours as needed. For anxiety      . metoprolol (LOPRESSOR) 50 MG tablet Take 50 mg by mouth daily.       Marland Kitchen omeprazole (PRILOSEC) 20 MG capsule Take 20 mg by mouth daily as needed. For acid reflux      . sertraline (ZOLOFT) 25 MG tablet Take 1 tablet (25 mg total) by mouth daily.  30 tablet  2  . sertraline (ZOLOFT) 50 MG tablet Take 1 tablet (50 mg total) by mouth daily.  30 tablet  2  . oxyCODONE-acetaminophen (PERCOCET) 5-325 MG per tablet daily.      . sertraline (ZOLOFT) 100 MG tablet Take 100 mg by mouth daily.      Marland Kitchen venlafaxine XR (EFFEXOR-XR) 37.5 MG 24 hr capsule Take 1 capsule (37.5 mg total) by mouth daily.  30 capsule  1    SURGICAL HISTORY:  Past Surgical History  Procedure Date  . Wisdom tooth extraction 1990  . Appendectomy 2008    emergency  . Breast lumpectomy 01/04/12    right breast    REVIEW OF SYSTEMS:  Pertinent items are noted in  HPI.   PHYSICAL EXAMINATION: General appearance: alert, cooperative and appears stated age Neck: no adenopathy, no carotid bruit, no JVD, supple, symmetrical, trachea midline and thyroid not enlarged, symmetric, no tenderness/mass/nodules Lymph nodes: Cervical, supraclavicular, and axillary nodes normal. Resp: clear to auscultation bilaterally and normal percussion bilaterally Back: symmetric, no curvature. ROM normal. No CVA tenderness. Cardio: regular rate and rhythm, S1, S2 normal, no murmur, click, rub or gallop GI: soft, non-tender; bowel sounds normal; no masses,  no organomegaly Extremities: extremities normal, atraumatic, no  cyanosis or edema Neurologic: Alert and oriented X 3, normal strength and tone. Normal symmetric reflexes. Normal coordination and gait Bilateral breast examination: Right breast reveals a very well-healed surgical scar there is no redness there is slight tenderness the breast is somewhat swollen but there is no other skin changes no nipple discharge no masses. Left breast no masses nipple discharge. ECOG PERFORMANCE STATUS: 0 - Asymptomatic  Blood pressure 128/83, pulse 80, temperature 98.2 F (36.8 C), temperature source Oral, height 5' 3.5" (1.613 m), weight 191 lb 4.8 oz (86.773 kg).  LABORATORY DATA: Lab Results  Component Value Date   WBC 14.4* 12/31/2011   HGB 12.5 12/31/2011   HCT 38.0 12/31/2011   MCV 90.5 12/31/2011   PLT 326 12/31/2011      Chemistry      Component Value Date/Time   NA 137 01/04/2012 0843   K 4.1 01/04/2012 0843   CL 103 01/04/2012 0843   CO2 24 01/04/2012 0843   BUN 12 01/04/2012 0843   CREATININE 0.70 01/04/2012 0843      Component Value Date/Time   CALCIUM 8.9 01/04/2012 0843   ALKPHOS 116 12/08/2011 0819   AST 26 12/08/2011 0819   ALT 34 12/08/2011 0819   BILITOT 0.1* 12/08/2011 0819     ADDITIONAL INFORMATION: 1. CHROMOGENIC IN-SITU HYBRIDIZATION Interpretation HER-2/NEU BY CISH - NO AMPLIFICATION OF HER-2 DETECTED. THE RATIO OF HER-2: CEP 17 SIGNALS WAS 1.30. Reference range: Ratio: HER2:CEP17 < 1.8 - gene amplification not observed Ratio: HER2:CEP 17 1.8-2.2 - equivocal result Ratio: HER2:CEP17 > 2.2 - gene amplification observed Abigail Miyamoto MD Pathologist, Electronic Signature ( Signed 01/11/2012) FINAL DIAGNOSIS Diagnosis 1. Breast, lumpectomy, Right - INVASIVE WELL-DIFFERENTIATED LOW GRADE DUCTAL CARCINOMA, SEE COMMENT. - NO LYMPHOVASCULAR INVASION IDENTIFIED. - INVASIVE TUMOR IS 0.7 CM FROM NEAREST MARGIN (LATERAL). - DUCTAL CARCINOMA IN SITU. - SEE TUMOR SYMPTOTIC TEMPLATE BELOW. 2. Lymph node, sentinel, biopsy, Right axillary - ONE  LYMPH NODE, NEGATIVE FOR TUMOR (0/1). 3. Lymph node, sentinel, biopsy, Right axillary - ONE LYMPH NODE, NEGATIVE FOR TUMOR (0/1). 4. Breast, excision, Additional lateral - BENIGN BREAST PARENCHYMA WITH FIBROCYSTIC CHANGE, SEE COMMENT. - NO ATYPIA OR MALIGNANCY PRESENT. 1 of 3 FINAL for CICI, RODRIGES [id number]) Microscopic Comment 1. BREAST, INVASIVE TUMOR, WITH LYMPH NODE SAMPLING Specimen, including laterality: Right breast Procedure: Lumpectomy Grade: I of III Tubule formation: 1 Nuclear pleomorphism: 1 Mitotic:1 Tumor size (gross measurement): 1.5 cm Margins: Invasive, distance to closest margin: 0.7 In-situ, distance to closest margin: 0.7 If margin positive, focally or broadly: N/A Lymphovascular invasion: Absent Ductal carcinoma in situ: Present Grade: 1 of 3 Extensive intraductal component: Absent Lobular neoplasia: Absent Tumor focality: Unifocal Treatment effect: None If present, treatment effect in breast tissue, lymph nodes or both: N/A Extent of tumor: Skin: N/A Nipple: N/A Skeletal muscle: N/A Lymph nodes: # examined: 2 Lymph nodes with metastasis:0 Breast prognostic profile: Estrogen receptor: Not repeated Progesterone receptor: Not repeated, previous study demonstrated 100% positivity ([id number]) Her  2 neu: Repeated, previous study demonstrated equivocal range for amplification (2.07) ([id number]) Ki-67: Not repeated, previous study demonstrated 5% proliferation rate Non-neoplastic breast: Previous biopsy site TNM: pT1c pN0 Comments: The invasive tumor is deceptively admixed with the in situ carcinoma. The absence of the myoepithelial layer is confirmed with no expression of smooth muscle myosin heavy chain. (CR:mw 01-06-12) 4. The surgical resection margin(s) of the specimen were inked and microscopically evaluated. (CR:mw 01-05-12) [place] RUND DO Pathologist, Electronic Signature  RADIOGRAPHIC STUDIES:  Nm Sentinel Node Inj-no Rpt  (breast)  01/04/2012  CLINICAL DATA: cancer right breast   Sulfur colloid was injected intradermally by the nuclear medicine  technologist for breast cancer sentinel node localization.     Mm Breast Surgical Specimen  01/04/2012  *RADIOLOGY REPORT*  Clinical Data:  Recent diagnosis of invasive ductal carcinoma in the right upper outer quadrant posteriorly.  RIGHT BREAST NEEDLE LOCALIZATION WITH MAMMOGRAPHIC GUIDANCE AND SPECIMEN RADIOGRAPH  Patient presents for needle localization prior to surgical excision. The patient and I discussed the procedure of needle localization including benefits and alternatives. We discussed the high likelihood of a successful procedure. We discussed the risks of the procedure, including infection, bleeding, tissue injury and further surgery. Informed written consent was given.  Using mammographic guidance, sterile technique, 2% lidocaine and a 7 cm modified Kopans needle, the clip and mass in the  right upper outer quadrant posteriorly was localized using a lateromedial approach.  Films were labeled and sent with the patient to surgery. She tolerated the procedure well.  Specimen radiograph was performed at [facility] operating room and confirms the mass, clip and wire to be present in the tissue sample.  The specimen is marked for pathology.  IMPRESSION: Needle localization right breast.  No apparent complications.  Original Report Authenticated By: Daryl Eastern, M.D.   Mm Breast Wire Localization Right  01/04/2012  *RADIOLOGY REPORT*  Clinical Data:  Recent diagnosis of invasive ductal carcinoma in the right upper outer quadrant posteriorly.  RIGHT BREAST NEEDLE LOCALIZATION WITH MAMMOGRAPHIC GUIDANCE AND SPECIMEN RADIOGRAPH  Patient presents for needle localization prior to surgical excision. The patient and I discussed the procedure of needle localization including benefits and alternatives. We discussed the high likelihood of a successful procedure. We  discussed the risks of the procedure, including infection, bleeding, tissue injury and further surgery. Informed written consent was given.  Using mammographic guidance, sterile technique, 2% lidocaine and a 7 cm modified Kopans needle, the clip and mass in the  right upper outer quadrant posteriorly was localized using a lateromedial approach.  Films were labeled and sent with the patient to surgery. She tolerated the procedure well.  Specimen radiograph was performed at [facility] operating room and confirms the mass, clip and wire to be present in the tissue sample.  The specimen is marked for pathology.  IMPRESSION: Needle localization right breast.  No apparent complications.  Original Report Authenticated By: Daryl Eastern, M.D.    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 Patient is status post lumpectomy with 2 sentinel nodes negative for metastatic disease.  #2 anxiety/depression patient is currently on Zoloft for this 100 mg on a daily basis.   PLAN:   #1 patient and I discussed her pathology in detail we also discussed Oncotype DX. I went through the results on Oncotype DX. She understands that she  has a 6% risk of distant recurrence with 5 years of tamoxifen which is a low risk. I have therefore recommended that we treat her with tamoxifen adjuvantly. She would not benefit with chemotherapy at all. However she also understands that Zoloft and tamoxifen may have drug drug interaction.  #2 patient was seen in followup today to see how she is doing with taper of Zoloft. Patient is becoming quite agitated. I have after an extensive discussion recommended that she go back up to Zoloft 100 mg daily. We have also gone ahead and started her on Effexor at 37.5 mg on a daily basis. After she has 1 week of Effexor she will  come down on the Zoloft dose again. I will continue to taper down but still of dose and titrate off the Effexor dose to a maximum of 75 mg. My hope is to get her off the Zoloft in the next few weeks.  #3 patient was seen by Dr. Lurline Hare and they are planning on going ahead with radiation therapy and she is planning on starting the radiation in the first week in May. All questions were answered. The patient knows to call the clinic with any problems, questions or concerns. We can certainly see the patient much sooner if necessary.  I spent 30 minutes counseling the patient face to face. The total time spent in the appointment was 30 minutes.    Drue Second, MD Medical/Oncology [facility] [phone] (beeper) [phone] (Office)  02/09/2012, 5:09 PM

## 2012-02-09 NOTE — Progress Notes (Signed)
HERE TODAY FOR CONSULT RIGHT BREAST CANCER CONSULT.  HAD LUMPECTOMY WITH SENTINAL NODE BX, BOTH NEG.  INCISION HEALING WELL AT SURGICAL SITE.   VERY ANXIOUS AND TEARY BECAUSE SHE IS BEING TAPERED OFF OF HER ZOLOFT SO SHE CAN TAKE HER TAMOXIFEN.  DR. Welton Flakes WILL REPLACE THE ZOLOFT WITH ANOTHER ANTIDEPRESSANT.

## 2012-02-09 NOTE — Patient Instructions (Signed)
1. Go up on the zoloft to 100 mg daily.  2. Begin effexor 37.5 mg daily.  3. In 1 week try to reduce the dose of zoloft to 75 mg.   4. In 2 weeks from today call us with how you are feeling on these medications

## 2012-02-10 ENCOUNTER — Telehealth: Payer: Self-pay | Admitting: Oncology

## 2012-02-10 NOTE — Telephone Encounter (Signed)
S/w the pt and she is aware of her md appt in may

## 2012-02-10 NOTE — Progress Notes (Signed)
Radiation Oncology         [phone] ________________________________  Name: Lori Mcmillan MRN: [id number]  Date: 02/09/2012  DOB: [birth year]  Follow-Up Visit Note  CC: HEPLER,MARK, PA, PA  Hoxworth, Lorne Skeens, MD  Diagnosis:   T1 N0 invasive ductal carcinoma  Narrative:  The patient returns today for routine follow-up.  She had a low Oncotype score and therefore will not be receiving chemotherapy. She has recovered well from her surgery. Her most significant complaint is being weaned off of her Zoloft in order to take the tamoxifen that Dr. Welton Flakes is recommending. She complains of anxiety and irritability since coming off this medication. She is meeting with Dr. Welton Flakes after this visit to start on something different. She had questions regarding the necessity of radiation given her low of the type score and her use of tamoxifen.                              ALLERGIES:   has no known allergies.  Meds: Current Outpatient Prescriptions  Medication Sig Dispense Refill  . LORazepam (ATIVAN) 1 MG tablet Take 1 mg by mouth every 8 (eight) hours as needed. For anxiety      . metoprolol (LOPRESSOR) 50 MG tablet Take 50 mg by mouth daily.       Marland Kitchen omeprazole (PRILOSEC) 20 MG capsule Take 20 mg by mouth daily as needed. For acid reflux      . oxyCODONE-acetaminophen (PERCOCET) 5-325 MG per tablet daily.      . sertraline (ZOLOFT) 100 MG tablet Take 100 mg by mouth daily.      . sertraline (ZOLOFT) 25 MG tablet Take 1 tablet (25 mg total) by mouth daily.  30 tablet  2  . sertraline (ZOLOFT) 50 MG tablet Take 1 tablet (50 mg total) by mouth daily.  30 tablet  2  . venlafaxine XR (EFFEXOR-XR) 37.5 MG 24 hr capsule Take 1 capsule (37.5 mg total) by mouth daily.  30 capsule  1    Physical Findings: The patient is in no acute distress. Patient is alert and oriented. . shows a well-healed lumpectomy scar on the right. She is alert and oriented x3.  height is 5' 3.5" (1.613 m) and weight is 191 lb  9.6 oz (86.909 kg). Her oral temperature is 97.7 F (36.5 C). Her blood pressure is 140/82 and her pulse is 90. Her respiration is 18. .  No significant changes.  Lab Findings: Lab Results  Component Value Date   WBC 14.4* 12/31/2011   HGB 12.5 12/31/2011   HCT 38.0 12/31/2011   MCV 90.5 12/31/2011   PLT 326 12/31/2011      Radiographic Findings: No results found.  Impression:  T1 N0 right breast cancer ER/PR positive  Plan:  I spoke with [name] today regarding her diagnosis. We discussed the results of randomized trials showing the equivalence in terms of survival between mastectomy and breast conservation. We discussed the role of radiation and decreasing local failures in patients who elected breast conservation. We discussed the small survival benefit seen in non randomized trials in patients who do receive radiation. We discussed the process of simulation on the placement of tattoos. We discussed 33 treatments as an outpatient. We discussed possible side effects of treatment including but not limited to skin redness fatigue damage to the lungs and tumor recurrence. She does work from home but has to have a week's notice to have  time off. We have therefore scheduled her simulation for next week. She has signed informed consent and agree to proceed forward.  _____________________________________

## 2012-02-16 ENCOUNTER — Ambulatory Visit
Admission: RE | Admit: 2012-02-16 | Discharge: 2012-02-16 | Disposition: A | Discharge status: Home / Self Care | Payer: Managed Care, Other (non HMO) | Source: Ambulatory Visit | Attending: Radiation Oncology | Admitting: Radiation Oncology

## 2012-02-16 DIAGNOSIS — C50419 Malignant neoplasm of upper-outer quadrant of unspecified female breast: Secondary | ICD-10-CM

## 2012-02-16 NOTE — Progress Notes (Signed)
Met with patient to discuss RO billing.  Patient had FMLA paperwork Dr. Michell Heinrich completed during visit today.  Made copy for scanning.  Patient had no other concerns today.

## 2012-02-16 NOTE — Progress Notes (Signed)
Name: Lori Mcmillan   MRN: [id number]  Date:  02/16/2012  DOB: 1961-04-25  Status:outpatient    DIAGNOSIS: Breast cancer.  CONSENT VERIFIED: yes   SET UP: Patient is setup supine   IMMOBILIZATION:  The following immobilization was used:Custom Moldable Pillow, breast board.   NARRATIVE: [name] was brought to the CT Simulation planning suite.  Identity was confirmed.  All relevant records and images related to the planned course of therapy were reviewed.  Then, the patient was positioned in a stable reproducible clinical set-up for radiation therapy.  Wires were placed to delineate the clinical extent of breast tissue. A wire was placed on the scar as well.  CT images were obtained.  An isocenter was placed. Skin markings were placed.  The CT images were loaded into the planning software where the target and avoidance structures were contoured.  The radiation prescription was entered and confirmed. The patient was discharged in stable condition and tolerated simulation well.    TREATMENT PLANNING NOTE:  Treatment planning then occurred. I have requested : MLC's, isodose plan, basic dose calculation

## 2012-02-23 ENCOUNTER — Ambulatory Visit
Admission: RE | Admit: 2012-02-23 | Discharge: 2012-02-23 | Disposition: A | Discharge status: Home / Self Care | Payer: Managed Care, Other (non HMO) | Source: Ambulatory Visit | Attending: Radiation Oncology | Admitting: Radiation Oncology

## 2012-02-23 DIAGNOSIS — C50419 Malignant neoplasm of upper-outer quadrant of unspecified female breast: Secondary | ICD-10-CM

## 2012-02-23 NOTE — Progress Notes (Signed)
  Radiation Oncology         [phone] ________________________________  Name: Lori Mcmillan MRN: [id number]  Date: 02/23/2012  DOB: Jun 18, 1961  Simulation Verification Note  Status: outpatient  NARRATIVE: The patient was brought to the treatment unit and placed in the planned treatment position. The clinical setup was verified. Then port films were obtained and uploaded to the radiation oncology medical record software.  The treatment beams were carefully compared against the planned radiation fields. The position location and shape of the radiation fields was reviewed. They targeted volume of tissue appears to be appropriately covered by the radiation beams. Organs at risk appear to be excluded as planned.  Based on my personal review, I approved the simulation verification. The patient's treatment will proceed as planned.  ------------------------------------------------  Lurline Hare, MD

## 2012-02-24 ENCOUNTER — Ambulatory Visit
Admission: RE | Admit: 2012-02-24 | Discharge: 2012-02-24 | Disposition: A | Discharge status: Home / Self Care | Payer: Managed Care, Other (non HMO) | Source: Ambulatory Visit | Attending: Radiation Oncology | Admitting: Radiation Oncology

## 2012-02-24 DIAGNOSIS — C50419 Malignant neoplasm of upper-outer quadrant of unspecified female breast: Secondary | ICD-10-CM

## 2012-02-24 MED ORDER — ALRA NON-METALLIC DEODORANT (RAD-ONC)
1.0000 "application " | Freq: Once | TOPICAL | Status: AC
  Administered 2012-02-24: 1 via TOPICAL

## 2012-02-24 MED ORDER — RADIAPLEXRX EX GEL
Freq: Once | CUTANEOUS | Status: AC
  Administered 2012-02-24: 20:00:00 via TOPICAL

## 2012-02-24 NOTE — Progress Notes (Signed)
Meet with patient today for post sim education. Patient unaccompanied. Patient is alert and oriented to person,place, and time. No distress noted. Steady gait noted. Pleasant affect noted. Patient denies pain at this time. Oriented patient to staff and routine of the clinic. Provided patient with RADIATION THERAPY AND YOU handbook then, reviewed pertinent information. Reviewed potential side effects and management. Provided patient with alra deodorant and radiaplex then, instructed upon use. Provided patient with this writers business card and encouraged to call with needs. All questions answered. Patient verbalized understanding of all things reviewed.

## 2012-02-25 ENCOUNTER — Ambulatory Visit
Admission: RE | Admit: 2012-02-25 | Discharge: 2012-02-25 | Disposition: A | Discharge status: Home / Self Care | Payer: Managed Care, Other (non HMO) | Source: Ambulatory Visit | Attending: Radiation Oncology | Admitting: Radiation Oncology

## 2012-02-28 ENCOUNTER — Ambulatory Visit
Admission: RE | Admit: 2012-02-28 | Discharge: 2012-02-28 | Disposition: A | Discharge status: Home / Self Care | Payer: Managed Care, Other (non HMO) | Source: Ambulatory Visit | Attending: Radiation Oncology | Admitting: Radiation Oncology

## 2012-02-29 ENCOUNTER — Ambulatory Visit
Admission: RE | Admit: 2012-02-29 | Discharge: 2012-02-29 | Disposition: A | Discharge status: Home / Self Care | Payer: Managed Care, Other (non HMO) | Source: Ambulatory Visit | Attending: Radiation Oncology | Admitting: Radiation Oncology

## 2012-02-29 ENCOUNTER — Encounter: Payer: Self-pay | Admitting: Radiation Oncology

## 2012-02-29 VITALS — BP 127/86 | HR 79 | Resp 18 | Wt 191.4 lb

## 2012-02-29 DIAGNOSIS — C50419 Malignant neoplasm of upper-outer quadrant of unspecified female breast: Secondary | ICD-10-CM

## 2012-02-29 NOTE — Progress Notes (Signed)
Received patient in the clinic today for under treat visit with Dr. Michell Heinrich. Patient is alert and oriented to person, place, and time. No distress noted. Steady gait noted. Pleasant affect noted. Patient denies pain at this time. Patient denies skin changes of the treated breast. Patient reports using Radiaplex as directed bid. Patient has no complaints. Reported all findings to Dr. Michell Heinrich.

## 2012-02-29 NOTE — Progress Notes (Signed)
Weekly Management Note Current Dose: 5.4  Gy  Projected Dose: 61 Gy   Narrative:  The patient presents for routine under treatment assessment.  CBCT/MVCT images/Port film x-rays were reviewed.  The chart was checked. No pain.  Doing well.   Physical Findings: Weight: 191 lb 6.4 oz (86.818 kg). Unchanged  Impression:  The patient is tolerating radiation.  Plan:  Continue treatment as planned. Continue radiaplex.

## 2012-03-01 ENCOUNTER — Ambulatory Visit: Payer: Managed Care, Other (non HMO) | Admitting: Oncology

## 2012-03-01 ENCOUNTER — Ambulatory Visit
Admission: RE | Admit: 2012-03-01 | Discharge: 2012-03-01 | Disposition: A | Discharge status: Home / Self Care | Payer: Managed Care, Other (non HMO) | Source: Ambulatory Visit | Attending: Radiation Oncology | Admitting: Radiation Oncology

## 2012-03-02 ENCOUNTER — Ambulatory Visit
Admission: RE | Admit: 2012-03-02 | Discharge: 2012-03-02 | Disposition: A | Discharge status: Home / Self Care | Payer: Managed Care, Other (non HMO) | Source: Ambulatory Visit | Attending: Radiation Oncology | Admitting: Radiation Oncology

## 2012-03-03 ENCOUNTER — Ambulatory Visit
Admission: RE | Admit: 2012-03-03 | Discharge: 2012-03-03 | Disposition: A | Discharge status: Home / Self Care | Payer: Managed Care, Other (non HMO) | Source: Ambulatory Visit | Attending: Radiation Oncology | Admitting: Radiation Oncology

## 2012-03-06 ENCOUNTER — Ambulatory Visit
Admission: RE | Admit: 2012-03-06 | Discharge: 2012-03-06 | Disposition: A | Discharge status: Home / Self Care | Payer: Managed Care, Other (non HMO) | Source: Ambulatory Visit | Attending: Radiation Oncology | Admitting: Radiation Oncology

## 2012-03-07 ENCOUNTER — Ambulatory Visit
Admission: RE | Admit: 2012-03-07 | Discharge: 2012-03-07 | Disposition: A | Discharge status: Home / Self Care | Payer: Managed Care, Other (non HMO) | Source: Ambulatory Visit | Attending: Radiation Oncology | Admitting: Radiation Oncology

## 2012-03-07 ENCOUNTER — Encounter: Payer: Self-pay | Admitting: Radiation Oncology

## 2012-03-07 VITALS — BP 129/82 | HR 83 | Resp 20 | Wt 188.5 lb

## 2012-03-07 DIAGNOSIS — C50419 Malignant neoplasm of upper-outer quadrant of unspecified female breast: Secondary | ICD-10-CM

## 2012-03-07 NOTE — Progress Notes (Signed)
Patient, alert,oriented x3, slight erythema on right breast, skin intact,no c/o pain or discomfort,loss 3 lbs, patient is trying to lose weight, vss, 9/33 tx s so far uses radiaplex daily 4:06 PM

## 2012-03-07 NOTE — Progress Notes (Signed)
Weekly Management Note Current Dose:  16.2 Gy  Projected Dose: 61 Gy   Narrative:  The patient presents for routine under treatment assessment.  CBCT/MVCT images/Port film x-rays were reviewed.  The chart was checked. Doing well. Tired. Breast is pink but not painful.   Physical Findings: Weight: 188 lb 8 oz (85.503 kg). Unchanged. Breast slightly pink. Inframammary folds clear.   Impression:  The patient is tolerating radiation.  Plan:  Continue treatment as planned. Continue radiaplex.

## 2012-03-08 ENCOUNTER — Ambulatory Visit
Admission: RE | Admit: 2012-03-08 | Discharge: 2012-03-08 | Disposition: A | Discharge status: Home / Self Care | Payer: Managed Care, Other (non HMO) | Source: Ambulatory Visit | Attending: Radiation Oncology | Admitting: Radiation Oncology

## 2012-03-09 ENCOUNTER — Ambulatory Visit
Admission: RE | Admit: 2012-03-09 | Discharge: 2012-03-09 | Disposition: A | Discharge status: Home / Self Care | Payer: Managed Care, Other (non HMO) | Source: Ambulatory Visit | Attending: Radiation Oncology | Admitting: Radiation Oncology

## 2012-03-10 ENCOUNTER — Ambulatory Visit
Admission: RE | Admit: 2012-03-10 | Discharge: 2012-03-10 | Disposition: A | Discharge status: Home / Self Care | Payer: Managed Care, Other (non HMO) | Source: Ambulatory Visit | Attending: Radiation Oncology | Admitting: Radiation Oncology

## 2012-03-14 ENCOUNTER — Ambulatory Visit
Admission: RE | Admit: 2012-03-14 | Discharge: 2012-03-14 | Disposition: A | Discharge status: Home / Self Care | Payer: Managed Care, Other (non HMO) | Source: Ambulatory Visit | Attending: Radiation Oncology | Admitting: Radiation Oncology

## 2012-03-14 ENCOUNTER — Encounter: Payer: Self-pay | Admitting: Radiation Oncology

## 2012-03-14 NOTE — Progress Notes (Signed)
HER TODAY FOR PUT OF RIGHT BREAST.  SKIN WITH BRIGHT RED RASH AND RED IN COLOR.  RASH IS ITCHING.

## 2012-03-15 ENCOUNTER — Ambulatory Visit
Admission: RE | Admit: 2012-03-15 | Discharge: 2012-03-15 | Disposition: A | Discharge status: Home / Self Care | Payer: Managed Care, Other (non HMO) | Source: Ambulatory Visit | Attending: Radiation Oncology | Admitting: Radiation Oncology

## 2012-03-16 ENCOUNTER — Ambulatory Visit
Admission: RE | Admit: 2012-03-16 | Discharge: 2012-03-16 | Disposition: A | Discharge status: Home / Self Care | Payer: Managed Care, Other (non HMO) | Source: Ambulatory Visit | Attending: Radiation Oncology | Admitting: Radiation Oncology

## 2012-03-17 ENCOUNTER — Ambulatory Visit
Admission: RE | Admit: 2012-03-17 | Discharge: 2012-03-17 | Disposition: A | Discharge status: Home / Self Care | Payer: Managed Care, Other (non HMO) | Source: Ambulatory Visit | Attending: Radiation Oncology | Admitting: Radiation Oncology

## 2012-03-20 ENCOUNTER — Ambulatory Visit
Admission: RE | Admit: 2012-03-20 | Discharge: 2012-03-20 | Disposition: A | Discharge status: Home / Self Care | Payer: Managed Care, Other (non HMO) | Source: Ambulatory Visit | Attending: Radiation Oncology | Admitting: Radiation Oncology

## 2012-03-21 ENCOUNTER — Ambulatory Visit
Admission: RE | Admit: 2012-03-21 | Discharge: 2012-03-21 | Disposition: A | Discharge status: Home / Self Care | Payer: Managed Care, Other (non HMO) | Source: Ambulatory Visit | Attending: Radiation Oncology | Admitting: Radiation Oncology

## 2012-03-21 ENCOUNTER — Encounter: Payer: Self-pay | Admitting: Radiation Oncology

## 2012-03-21 VITALS — BP 138/81 | HR 75 | Resp 18 | Wt 187.7 lb

## 2012-03-21 DIAGNOSIS — C50419 Malignant neoplasm of upper-outer quadrant of unspecified female breast: Secondary | ICD-10-CM

## 2012-03-21 NOTE — Progress Notes (Signed)
Patient presents to the clinic today unaccompanied for an under treat visit with Dr. Michell Heinrich. Patient is alert and oriented to person, place, and time. No distress noted. Steady gait noted. Pleasant affect noted. Patient denies pain at this time. Right/treated breast hyperpigmented with raised itchy rash despite using "gracious amounts of hydrocortisone and radiaplex." Encouraged patient to refrain from scratching. No desquamation noted.  Patient has no other complaints. Reported all findings to Dr. Michell Heinrich.

## 2012-03-21 NOTE — Progress Notes (Signed)
Weekly Management Note Current Dose: 32.4  Gy  Projected Dose: 60.4 Gy   Narrative:  The patient presents for routine under treatment assessment.  CBCT/MVCT images/Port film x-rays were reviewed.  The chart was checked. Doing well. Still itching despite cortisone. Fatigue on Friday.  Physical Findings: Weight: 187 lb 11.2 oz (85.14 kg). Dermatitis in medial breast.  Impression:  The patient is tolerating radiation.  Plan:  Continue treatment as planned. Switch to biafene.

## 2012-03-22 ENCOUNTER — Encounter: Payer: Self-pay | Admitting: Radiation Oncology

## 2012-03-22 ENCOUNTER — Ambulatory Visit
Admission: RE | Admit: 2012-03-22 | Discharge: 2012-03-22 | Disposition: A | Discharge status: Home / Self Care | Payer: Managed Care, Other (non HMO) | Source: Ambulatory Visit | Attending: Radiation Oncology | Admitting: Radiation Oncology

## 2012-03-22 MED ORDER — BIAFINE EX EMUL
CUTANEOUS | Status: DC | PRN
  Administered 2012-03-21: 1 via TOPICAL

## 2012-03-22 NOTE — Progress Notes (Signed)
Encounter addended by: Delynn Flavin, RN on: 03/22/2012 11:56 AM<BR>     Documentation filed: Orders

## 2012-03-22 NOTE — Progress Notes (Signed)
Encounter addended by: Delynn Flavin, RN on: 03/22/2012 11:57 AM<BR>     Documentation filed: Inpatient MAR

## 2012-03-22 NOTE — Progress Notes (Signed)
Name: Lori Mcmillan   MRN: [id number]  Date:  03/22/2012   DOB: Jan 06, 1961  Status:outpatient    DIAGNOSIS: Breast cancer.  CONSENT VERIFIED: yes   SET UP: Patient is setup supine   IMMOBILIZATION:  The following immobilization was used:Custom Moldable Pillow, breast board.   NARRATIVE: [name] underwent complex simulation and treatment planning for her boost treatment today.  Her tumor volume was outlined on the planning CT scan. The depth of her cavity was 5.8 Cm.    18  MeV electrons will be prescribed to the 90%  Isodose line.   A block will be used for beam modification purposes.  A special port plan is requested.

## 2012-03-23 ENCOUNTER — Ambulatory Visit
Admission: RE | Admit: 2012-03-23 | Discharge: 2012-03-23 | Disposition: A | Discharge status: Home / Self Care | Payer: Managed Care, Other (non HMO) | Source: Ambulatory Visit | Attending: Radiation Oncology | Admitting: Radiation Oncology

## 2012-03-24 ENCOUNTER — Ambulatory Visit
Admission: RE | Admit: 2012-03-24 | Discharge: 2012-03-24 | Disposition: A | Discharge status: Home / Self Care | Payer: Managed Care, Other (non HMO) | Source: Ambulatory Visit | Attending: Radiation Oncology | Admitting: Radiation Oncology

## 2012-03-27 ENCOUNTER — Ambulatory Visit
Admission: RE | Admit: 2012-03-27 | Discharge: 2012-03-27 | Disposition: A | Discharge status: Home / Self Care | Payer: Managed Care, Other (non HMO) | Source: Ambulatory Visit | Attending: Radiation Oncology | Admitting: Radiation Oncology

## 2012-03-27 NOTE — Progress Notes (Signed)
Encounter addended by: Tessa Lerner, RN on: 03/27/2012  3:23 PM<BR>     Documentation filed: Charges VN

## 2012-03-28 ENCOUNTER — Ambulatory Visit
Admission: RE | Admit: 2012-03-28 | Discharge: 2012-03-28 | Disposition: A | Discharge status: Home / Self Care | Payer: Managed Care, Other (non HMO) | Source: Ambulatory Visit | Attending: Radiation Oncology | Admitting: Radiation Oncology

## 2012-03-28 DIAGNOSIS — C50419 Malignant neoplasm of upper-outer quadrant of unspecified female breast: Secondary | ICD-10-CM

## 2012-03-28 NOTE — Progress Notes (Signed)
Name: BEATRIS BELEN   MRN: [id number]  Date:  03/28/2012  DOB: 11/24/1960  Status:outpatient    DIAGNOSIS: Breast cancer.  CONSENT VERIFIED: yes   SET UP: Patient is left lateral decubitus   NARRATIVE: Ms. Ferre was brought to the CT Simulation planning suite after I was not satisfied with her electron set up on the treatment machine.  Identity was confirmed.  All relevant records and images related to the planned course of therapy were reviewed.  Then, the patient was positioned in a stable reproducible clinical set-up for radiation therapy.  Wires were placed around the skin marks we had made on the treatment machine. A bb was placed on the central axis.  CT images were obtained.  An isocenter was placed. Skin markings were placed.  The CT images were loaded into the planning software where the target and avoidance structures were contoured.  The radiation prescription was entered and confirmed. The patient was discharged in stable condition and tolerated simulation well.    TREATMENT PLANNING NOTE:  Treatment planning then occurred. I have requested a block for beam modification and a special port plan.

## 2012-03-28 NOTE — Progress Notes (Signed)
Weekly Management Note Current Dose:41.4   Gy  Projected Dose: 61 Gy   Narrative:  The patient presents for routine under treatment assessment.  CBCT/MVCT images/Port film x-rays were reviewed.  The chart was checked. Some irritation medially. Biafene helping. Saw on machine for scar boost.   Physical Findings: Weight:  . Unchanged skin.   Impression:  The patient is tolerating radiation.  Plan:  Continue treatment as planned. Continue biafene.  Will transfer to sim for lateral decub planning position.

## 2012-03-29 ENCOUNTER — Ambulatory Visit
Admission: RE | Admit: 2012-03-29 | Discharge: 2012-03-29 | Disposition: A | Discharge status: Home / Self Care | Payer: Managed Care, Other (non HMO) | Source: Ambulatory Visit | Attending: Radiation Oncology | Admitting: Radiation Oncology

## 2012-03-30 ENCOUNTER — Ambulatory Visit
Admission: RE | Admit: 2012-03-30 | Discharge: 2012-03-30 | Disposition: A | Discharge status: Home / Self Care | Payer: Managed Care, Other (non HMO) | Source: Ambulatory Visit | Attending: Radiation Oncology | Admitting: Radiation Oncology

## 2012-03-31 ENCOUNTER — Ambulatory Visit
Admission: RE | Admit: 2012-03-31 | Discharge: 2012-03-31 | Disposition: A | Discharge status: Home / Self Care | Payer: Managed Care, Other (non HMO) | Source: Ambulatory Visit | Attending: Radiation Oncology | Admitting: Radiation Oncology

## 2012-04-03 ENCOUNTER — Ambulatory Visit
Admission: RE | Admit: 2012-04-03 | Discharge: 2012-04-03 | Disposition: A | Discharge status: Home / Self Care | Payer: Managed Care, Other (non HMO) | Source: Ambulatory Visit | Attending: Radiation Oncology | Admitting: Radiation Oncology

## 2012-04-04 ENCOUNTER — Encounter: Payer: Self-pay | Admitting: Radiation Oncology

## 2012-04-04 ENCOUNTER — Ambulatory Visit
Admission: RE | Admit: 2012-04-04 | Discharge: 2012-04-04 | Disposition: A | Discharge status: Home / Self Care | Payer: Managed Care, Other (non HMO) | Source: Ambulatory Visit | Attending: Radiation Oncology | Admitting: Radiation Oncology

## 2012-04-04 VITALS — BP 117/82 | HR 76 | Resp 18 | Wt 191.0 lb

## 2012-04-04 DIAGNOSIS — C50419 Malignant neoplasm of upper-outer quadrant of unspecified female breast: Secondary | ICD-10-CM

## 2012-04-04 NOTE — Progress Notes (Signed)
Patient presents to the clinic today unaccompanied for an under treat visit with Dr. Roselind Messier. Patient is alert and oriented to person, place, and time. No distress noted. Steady gait noted. Pleasant affect noted. Patient denies pain at this time. Patient reports that her right breast is tender. Hyperpigmentation with small amount of moist desquamation noted. Skin greatly improved in appearance from last week. Patient reports alternating between biafine and radiaplex. Patient denies any itching of breast. Reported all findings to Dr. Roselind Messier.

## 2012-04-04 NOTE — Progress Notes (Signed)
   Department of Radiation Oncology  Phone:  [phone] Fax:        [phone]   Weekly Management Note Current Dose: 51.0  Gy  Projected Dose: 61.0 Gy   Narrative:  The patient presents for routine under treatment assessment.  Port film x-rays were reviewed.  The chart was checked. She seems to be tolerating her treatments little better this week. Patient is alternating between Biafine radiaplex for her skin.  Physical Findings: Weight: 191 lb (86.637 kg). The lungs are clear. The heart has regular rhythm and rate. Examination right breast reveals hyperpigmentation changes and erythema.  There is a very small amount of moist desquamation in the inframammary fold area.  Impression:  The patient is tolerating radiation.  Plan:  Continue treatment as planned.   -----------------------------------  Billie Lade, PhD, MD

## 2012-04-05 ENCOUNTER — Encounter: Payer: Self-pay | Admitting: *Deleted

## 2012-04-05 ENCOUNTER — Ambulatory Visit
Admission: RE | Admit: 2012-04-05 | Discharge: 2012-04-05 | Disposition: A | Discharge status: Home / Self Care | Payer: Managed Care, Other (non HMO) | Source: Ambulatory Visit | Attending: Radiation Oncology | Admitting: Radiation Oncology

## 2012-04-05 ENCOUNTER — Other Ambulatory Visit: Payer: Self-pay | Admitting: Oncology

## 2012-04-05 ENCOUNTER — Telehealth: Payer: Self-pay | Admitting: Radiation Oncology

## 2012-04-05 ENCOUNTER — Telehealth: Payer: Self-pay | Admitting: *Deleted

## 2012-04-05 NOTE — Telephone Encounter (Signed)
Following conversation yesterday with patient and discussion with Corrie Dandy, RN for Dr. Welton Flakes. Called patient to make her aware of follow up appointment with Dr. Welton Flakes on 04/11/12 at 1100. Patient verbalized understanding and expressed appreciation for handling of this matter.

## 2012-04-05 NOTE — Telephone Encounter (Signed)
Left voice message informing the patient of the new date and time on 04-11-2012 at 11:00am asked patient to please call me back and let me know they received the message

## 2012-04-06 ENCOUNTER — Ambulatory Visit
Admission: RE | Admit: 2012-04-06 | Discharge: 2012-04-06 | Disposition: A | Discharge status: Home / Self Care | Payer: Managed Care, Other (non HMO) | Source: Ambulatory Visit | Attending: Radiation Oncology | Admitting: Radiation Oncology

## 2012-04-07 ENCOUNTER — Ambulatory Visit
Admission: RE | Admit: 2012-04-07 | Discharge: 2012-04-07 | Disposition: A | Discharge status: Home / Self Care | Payer: Managed Care, Other (non HMO) | Source: Ambulatory Visit | Attending: Radiation Oncology | Admitting: Radiation Oncology

## 2012-04-10 ENCOUNTER — Ambulatory Visit
Admission: RE | Admit: 2012-04-10 | Discharge: 2012-04-10 | Disposition: A | Discharge status: Home / Self Care | Payer: Managed Care, Other (non HMO) | Source: Ambulatory Visit | Attending: Radiation Oncology | Admitting: Radiation Oncology

## 2012-04-11 ENCOUNTER — Telehealth: Payer: Self-pay | Admitting: *Deleted

## 2012-04-11 ENCOUNTER — Ambulatory Visit
Admission: RE | Admit: 2012-04-11 | Discharge: 2012-04-11 | Disposition: A | Discharge status: Home / Self Care | Payer: Managed Care, Other (non HMO) | Source: Ambulatory Visit | Attending: Radiation Oncology | Admitting: Radiation Oncology

## 2012-04-11 ENCOUNTER — Ambulatory Visit (HOSPITAL_BASED_OUTPATIENT_CLINIC_OR_DEPARTMENT_OTHER): Payer: Managed Care, Other (non HMO) | Admitting: Oncology

## 2012-04-11 ENCOUNTER — Encounter: Payer: Self-pay | Admitting: Oncology

## 2012-04-11 ENCOUNTER — Encounter: Payer: Self-pay | Admitting: Radiation Oncology

## 2012-04-11 VITALS — BP 115/78 | HR 79 | Resp 18 | Wt 190.0 lb

## 2012-04-11 VITALS — BP 145/84 | HR 77 | Temp 98.2°F | Ht 63.5 in | Wt 189.8 lb

## 2012-04-11 DIAGNOSIS — Z170 Estrogen receptor positive status [ER+]: Secondary | ICD-10-CM

## 2012-04-11 DIAGNOSIS — C50419 Malignant neoplasm of upper-outer quadrant of unspecified female breast: Secondary | ICD-10-CM

## 2012-04-11 DIAGNOSIS — C50919 Malignant neoplasm of unspecified site of unspecified female breast: Secondary | ICD-10-CM

## 2012-04-11 DIAGNOSIS — R232 Flushing: Secondary | ICD-10-CM

## 2012-04-11 MED ORDER — RADIAPLEXRX EX GEL
Freq: Once | CUTANEOUS | Status: AC
  Administered 2012-04-11: 14:00:00 via TOPICAL

## 2012-04-11 MED ORDER — VENLAFAXINE HCL ER 75 MG PO CP24
75.0000 mg | ORAL_CAPSULE | Freq: Every day | ORAL | Status: AC
Start: 2012-04-11 — End: 2013-04-11

## 2012-04-11 MED ORDER — TAMOXIFEN CITRATE 20 MG PO TABS: 20.0000 mg | ORAL_TABLET | Freq: Every day | ORAL | Status: AC

## 2012-04-11 NOTE — Telephone Encounter (Signed)
Made patient appointment for 05-09-2012 starting at 10:30am

## 2012-04-11 NOTE — Progress Notes (Signed)
Patient presents to the clinic today following her final treatment for a PUT appointment with Dr. Michell Heinrich. Patient is alert and oriented to person, place, and time. No distress noted. Steady gait noted. Pleasant affect noted. Patient denies pain at this time. Hyperpigmentation of the right/treated breast with dry desquamation at axilla noted. Reinforced skin care. Provided patient with another tube of Radiaplex. Also, provided patient with telfa dressing to provide a barrier while dressed for axilla and nipple. Provided patient with [facility] and [facility] flyers then, reviewed pertinent information. Provided patient with appointment card for follow up in one month. Encouraged patient to call with needs and she verbalized understanding. Patient seen by Dr. Welton Flakes today and given a script for Tamoxifen. Also, patient to follow up with Welton Flakes in one month. Reported all findings to Dr. Michell Heinrich.

## 2012-04-11 NOTE — Progress Notes (Signed)
OFFICE PROGRESS NOTE  CC  [place] [address] [place] [address] Dr. Glenna Fellows Dr. 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  PRIOR THERAPY:  #1 patient is status post right lumpectomy on 01/04/2012. 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 2 sentinel nodes were negative for metastatic disease. Tumor was grade 1. 2 sentinel nodes were negative for metastatic disease tumor was estrogen receptor positive progesterone receptor +100% HER-2/neu was not amplified with a ratio of 1.30. Proliferation marker 5%.  #2 patient is scheduled to be seen by Dr. Lurline Hare on 02/09/2012 for discussion of post lumpectomy radiation.  #3 patient had Oncotype DX performed and the recurrence score was 7 giving her a 6% 5 year distance recurrence with tamoxifen  #4 patient has completed radiation therapy on  04/11/2012.  #5 to begin adjuvant tamoxifen 20 mg daily starting on 04/17/2012. A total of 5 years of therapy is planned presently.  CURRENT THERAPY: Tamoxifen 20 mg daily  INTERVAL HISTORY: Lori Mcmillan 51 y.o. female returns for Followup visit She will complete her radiation therapy today. Overall she tolerated the radiation well she did have some excoriation of the skin with moist desquamation. Redness in her tenderness. She otherwise tolerated it well she today feels well she is fatigued she denies any fevers chills night sweats headaches no shortness of breath no chest pains palpitations. Patient is also on Effexor 75 mg on a daily basis for her depression and anxiety. I had originally started her on 37.5 mg but she doubled the dose and she does feel that this is helping her  significantly. She is completely off of Zoloft at this time. Remainder of the 10 point review of systems is negative.  MEDICAL HISTORY: Past Medical History  Diagnosis Date  . Palpitations     takes Metoprolol for palpitations  . Anxiety   . Depression   . Cancer     Rt breast    ALLERGIES:   has no known allergies.  MEDICATIONS:  Current Outpatient Prescriptions  Medication Sig Dispense Refill  . emollient (BIAFINE) cream Apply topically as needed.      Marland Kitchen LORazepam (ATIVAN) 1 MG tablet Take 1 mg by mouth every 8 (eight) hours as needed. For anxiety      . metoprolol (LOPRESSOR) 50 MG tablet Take 50 mg by mouth daily.       . metoprolol succinate (TOPROL-XL) 50 MG 24 hr tablet       . non-metallic deodorant (ALRA) MISC Apply 1 application topically daily as needed.      Marland Kitchen omeprazole (PRILOSEC) 20 MG capsule Take 20 mg by mouth daily as needed. For acid reflux      . sertraline (ZOLOFT) 25 MG tablet Take 1 tablet (25 mg total) by mouth daily.  30 tablet  2  . Wound Cleansers (RADIAPLEX EX) Apply topically.      Marland Kitchen oxyCODONE-acetaminophen (PERCOCET) 5-325 MG per tablet       . tamoxifen (NOLVADEX) 20 MG tablet Take 1 tablet (20 mg total) by mouth daily.  30 tablet  5  . venlafaxine XR (EFFEXOR-XR) 75 MG 24 hr capsule Take 1 capsule (75 mg total) by mouth daily.  90 capsule  12  .  DISCONTD: sertraline (ZOLOFT) 100 MG tablet Take 50 mg by mouth daily. Taking 75mg  total, weaning off      . DISCONTD: sertraline (ZOLOFT) 50 MG tablet Take 1 tablet (50 mg total) by mouth daily.  30 tablet  2   No current facility-administered medications for this visit.   Facility-Administered Medications Ordered in Other Visits  Medication Dose Route Frequency Provider Last Rate Last Dose  . hyaluronate sodium (RADIAPLEXRX) gel   Topical Once Lurline Hare, MD      . topical emolient (BIAFINE) emulsion   Topical PRN Lurline Hare, MD   1 application at 03/21/12 1730    SURGICAL HISTORY:  Past  Surgical History  Procedure Date  . Wisdom tooth extraction 1990  . Appendectomy 2008    emergency  . Breast lumpectomy 01/04/12    right breast    REVIEW OF SYSTEMS:  Pertinent items are noted in HPI.   PHYSICAL EXAMINATION: General appearance: alert, cooperative and appears stated age Neck: no adenopathy, no carotid bruit, no JVD, supple, symmetrical, trachea midline and thyroid not enlarged, symmetric, no tenderness/mass/nodules Lymph nodes: Cervical, supraclavicular, and axillary nodes normal. Resp: clear to auscultation bilaterally and normal percussion bilaterally Back: symmetric, no curvature. ROM normal. No CVA tenderness. Cardio: regular rate and rhythm, S1, S2 normal, no murmur, click, rub or gallop GI: soft, non-tender; bowel sounds normal; no masses,  no organomegaly Extremities: extremities normal, atraumatic, no cyanosis or edema Neurologic: Alert and oriented X 3, normal strength and tone. Normal symmetric reflexes. Normal coordination and gait Bilateral breast examination: Right breast reveals a very well-healed surgical scar there is no redness there is slight tenderness the breast is somewhat swollen but there is no other skin changes no nipple discharge no masses. Left breast no masses nipple discharge. ECOG PERFORMANCE STATUS: 0 - Asymptomatic  Blood pressure 145/84, pulse 77, temperature 98.2 F (36.8 C), height 5' 3.5" (1.613 m), weight 189 lb 12.8 oz (86.093 kg).  LABORATORY DATA: Lab Results  Component Value Date   WBC 14.4* 12/31/2011   HGB 12.5 12/31/2011   HCT 38.0 12/31/2011   MCV 90.5 12/31/2011   PLT 326 12/31/2011      Chemistry      Component Value Date/Time   NA 137 01/04/2012 0843   K 4.1 01/04/2012 0843   CL 103 01/04/2012 0843   CO2 24 01/04/2012 0843   BUN 12 01/04/2012 0843   CREATININE 0.70 01/04/2012 0843      Component Value Date/Time   CALCIUM 8.9 01/04/2012 0843   ALKPHOS 116 12/08/2011 0819   AST 26 12/08/2011 0819   ALT 34 12/08/2011 0819    BILITOT 0.1* 12/08/2011 0819     ADDITIONAL INFORMATION: 1. CHROMOGENIC IN-SITU HYBRIDIZATION Interpretation HER-2/NEU BY CISH - NO AMPLIFICATION OF HER-2 DETECTED. THE RATIO OF HER-2: CEP 17 SIGNALS WAS 1.30. Reference range: Ratio: HER2:CEP17 < 1.8 - gene amplification not observed Ratio: HER2:CEP 17 1.8-2.2 - equivocal result Ratio: HER2:CEP17 > 2.2 - gene amplification observed Lori Miyamoto MD Pathologist, Electronic Signature ( Signed 01/11/2012) FINAL DIAGNOSIS Diagnosis 1. Breast, lumpectomy, Right - INVASIVE WELL-DIFFERENTIATED LOW GRADE DUCTAL CARCINOMA, SEE COMMENT. - NO LYMPHOVASCULAR INVASION IDENTIFIED. - INVASIVE TUMOR IS 0.7 CM FROM NEAREST MARGIN (LATERAL). - DUCTAL CARCINOMA IN SITU. - SEE TUMOR SYMPTOTIC TEMPLATE BELOW. 2. Lymph node, sentinel, biopsy, Right axillary - ONE LYMPH NODE, NEGATIVE FOR TUMOR (0/1). 3. Lymph node, sentinel, biopsy, Right axillary - ONE LYMPH NODE, NEGATIVE FOR TUMOR (0/1). 4. Breast, excision, Additional lateral -  BENIGN BREAST PARENCHYMA WITH FIBROCYSTIC CHANGE, SEE COMMENT. - NO ATYPIA OR MALIGNANCY PRESENT. 1 of 3 FINAL for LAMYRA, MALCOLM [phone]) Microscopic Comment 1. BREAST, INVASIVE TUMOR, WITH LYMPH NODE SAMPLING Specimen, including laterality: Right breast Procedure: Lumpectomy Grade: I of III Tubule formation: 1 Nuclear pleomorphism: 1 Mitotic:1 Tumor size (gross measurement): 1.5 cm Margins: Invasive, distance to closest margin: 0.7 In-situ, distance to closest margin: 0.7 If margin positive, focally or broadly: N/A Lymphovascular invasion: Absent Ductal carcinoma in situ: Present Grade: 1 of 3 Extensive intraductal component: Absent Lobular neoplasia: Absent Tumor focality: Unifocal Treatment effect: None If present, treatment effect in breast tissue, lymph nodes or both: N/A Extent of tumor: Skin: N/A Nipple: N/A Skeletal muscle: N/A Lymph nodes: # examined: 2 Lymph nodes with  metastasis:0 Breast prognostic profile: Estrogen receptor: Not repeated Progesterone receptor: Not repeated, previous study demonstrated 100% positivity ([id number]) Her 2 neu: Repeated, previous study demonstrated equivocal range for amplification (2.07) ([id number]) Ki-67: Not repeated, previous study demonstrated 5% proliferation rate Non-neoplastic breast: Previous biopsy site TNM: pT1c pN0 Comments: The invasive tumor is deceptively admixed with the in situ carcinoma. The absence of the myoepithelial layer is confirmed with no expression of smooth muscle myosin heavy chain. (CR:mw 01-06-12) 4. The surgical resection margin(s) of the specimen were inked and microscopically evaluated. 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 Patient is status post lumpectomy with 2 sentinel nodes negative for metastatic disease.Post surgery patient had Oncotype DX performed that showed a low risk score giving her a 6% risk of distant recurrence at 5 years. She then began radiation therapy. She will complete all of her radiation today June 25.  #2 starting 04/17/2012 patient will begin tamoxifen adjuvantly 20 mg on a daily basis. Risks and benefits of treatment were discussed with the patient.    PLAN:    #1Patient is now completing her radiation therapy today. She will begin tamoxifen 20 mg daily starting next week. Risks and benefits of tamoxifen have been discussed with the patient.  #2 patient is now on Effexor 75 mg daily. She is off of Zoloft. This certainly is a step and the positive direction.  #3 risks and benefits of tamoxifen were discussed with the patient and literature has been given to her.  #4 I will plan on seeing the patient back  in 1 months time or sooner if need arises.  I spent 30 minutes counseling the patient face to face. The total time spent in the appointment was 30 minutes.    Lori Second, MD Medical/Oncology [facility] [phone] (beeper) [phone] (Office)  04/11/2012, 1:55 PM

## 2012-04-11 NOTE — Patient Instructions (Addendum)
1. Begin tamoxifen 20 mg daily after you finish the radiation  2. I will see you back in 1 month.  Tamoxifen oral tablet What is this medicine? TAMOXIFEN (ta MOX i fen) blocks the effects of estrogen. It is commonly used to treat breast cancer. It is also used to decrease the chance of breast cancer coming back in women who have received treatment for the disease. It may also help prevent breast cancer in women who have a high risk of developing breast cancer. This medicine may be used for other purposes; ask your health care provider or pharmacist if you have questions. What should I tell my health care provider before I take this medicine? They need to know if you have any of these conditions: -blood clots -blood disease -cataracts or impaired eyesight -endometriosis -high calcium levels -high cholesterol -irregular menstrual cycles -liver disease -stroke -uterine fibroids -an unusual or allergic reaction to tamoxifen, other medicines, foods, dyes, or preservatives -pregnant or trying to get pregnant -breast-feeding How should I use this medicine? Take this medicine by mouth with a glass of water. Follow the directions on the prescription label. You can take it with or without food. Take your medicine at regular intervals. Do not take your medicine more often than directed. Do not stop taking except on your doctor's advice. A special MedGuide will be given to you by the pharmacist with each prescription and refill. Be sure to read this information carefully each time. Talk to your pediatrician regarding the use of this medicine in children. While this drug may be prescribed for selected conditions, precautions do apply. Overdosage: If you think you have taken too much of this medicine contact a poison control center or emergency room at once. NOTE: This medicine is only for you. Do not share this medicine with others. What if I miss a dose? If you miss a dose, take it as soon as you  can. If it is almost time for your next dose, take only that dose. Do not take double or extra doses. What may interact with this medicine? -aminoglutethimide -bromocriptine -chemotherapy drugs -female hormones, like estrogens and birth control pills -letrozole -medroxyprogesterone -phenobarbital -rifampin -warfarin This list may not describe all possible interactions. Give your health care provider a list of all the medicines, herbs, non-prescription drugs, or dietary supplements you use. Also tell them if you smoke, drink alcohol, or use illegal drugs. Some items may interact with your medicine. What should I watch for while using this medicine? Visit your doctor or health care professional for regular checks on your progress. You will need regular pelvic exams, breast exams, and mammograms. If you are taking this medicine to reduce your risk of getting breast cancer, you should know that this medicine does not prevent all types of breast cancer. If breast cancer or other problems occur, there is no guarantee that it will be found at an early stage. Do not become pregnant while taking this medicine or for 2 months after stopping this medicine. Stop taking this medicine if you get pregnant or think you are pregnant and contact your doctor. This medicine may harm your unborn baby. Women who can possibly become pregnant should use birth control methods that do not use hormones during tamoxifen treatment and for 2 months after therapy has stopped. Talk with your health care provider for birth control advice. Do not breast feed while taking this medicine. What side effects may I notice from receiving this medicine? Side effects that you should  report to your doctor or health care professional as soon as possible: -changes in vision (blurred vision) -changes in your menstrual cycle -difficulty breathing or shortness of breath -difficulty walking or talking -new breast lumps -numbness -pelvic pain  or pressure -redness, blistering, peeling or loosening of the skin, including inside the mouth -skin rash or itching (hives) -sudden chest pain -swelling of lips, face, or tongue -swelling, pain or tenderness in your calf or leg -unusual bruising or bleeding -vaginal discharge that is bloody, brown, or rust -weakness -yellowing of the whites of the eyes or skin Side effects that usually do not require medical attention (report to your doctor or health care professional if they continue or are bothersome): -fatigue -hair loss, although uncommon and is usually mild -headache -hot flashes -impotence (in men) -nausea, vomiting (mild) -vaginal discharge (white or clear) This list may not describe all possible side effects. Call your doctor for medical advice about side effects. You may report side effects to FDA at 1-800-FDA-1088. Where should I keep my medicine? Keep out of the reach of children. Store at room temperature between 20 and 25 degrees C (68 and 77 degrees F). Protect from light. Keep container tightly closed. Throw away any unused medicine after the expiration date. NOTE: This sheet is a summary. It may not cover all possible information. If you have questions about this medicine, talk to your doctor, pharmacist, or health care provider.  2012, [employer]. (06/20/2008 12:01:56 PM)

## 2012-04-11 NOTE — Progress Notes (Signed)
Weekly Management Note Current Dose: 61  Gy  Projected Dose: 61 Gy   Narrative:  The patient presents for routine under treatment assessment.  CBCT/MVCT images/Port film x-rays were reviewed.  The chart was checked. Inframammary folds are better. Irritation at lateral breast.   Physical Findings: Weight: 190 lb (86.183 kg). Inframammary folds well healed. Axilla shows some moist desquamation and skin breakdown.  Impression:  The patient is tolerating radiation.  Plan:  Continue treatment as planned. Neosporin to axilla. Continue radiaplex x 2 weeks then lotion with vit e. Discussed FYNN and sun protection. Fu in 1 month. Has script for tam.

## 2012-04-16 NOTE — Progress Notes (Signed)
  Radiation Oncology         [phone] ________________________________  Name: Lori Mcmillan MRN: [id number]  Date: 04/11/2012  DOB: 1961/08/14  End of Treatment Note  Diagnosis:  T1a invasive ductal carcinoma of the right breast  Indication for treatment:  Curative      Radiation treatment dates:  02/24/2012-04/11/2012  Site/dose:    Right breast / 45 Gray at 1.8 Wallace Cullens per fraction x 25 fractions Right breast boost / 16 Gray at [employer] per fraction x 8 fractions  Beams/energy:   Opposed tangents with reduced fields / 6 and 10 MV photons En face electrons / 15 MeV  Narrative: The patient tolerated radiation treatment relatively well.   She was able to work through the course of treatment.  She had some moist desquamation in the inframammary folds which was treated with genetian violet.    Plan: The patient has completed radiation treatment. The patient will return to radiation oncology clinic for routine followup in one month. I advised them to call or return sooner if they have any questions or concerns related to their recovery or treatment.  ------------------------------------------------  Lurline Hare, MD

## 2012-04-20 NOTE — Progress Notes (Signed)
Weekly Management Note for 5/28/ 2013 (Incorrectly added as PUT to Dr. Trina Ao schedule) Current Dose: 23.4  Gy  Projected Dose: 45 Gy   Narrative:  The patient presents for routine under treatment assessment.  CBCT/MVCT images/Port film x-rays were reviewed.  The chart was checked. Doing well. Itchy red rash   Physical Findings: Red dermatitis medially. Some early desquamation in inframammary folds  Impression:  The patient is tolerating radiation.  Plan:  Continue treatment as planned. Continue radiaplex. Add hydrocortisone as needed.

## 2012-05-09 ENCOUNTER — Other Ambulatory Visit: Payer: Managed Care, Other (non HMO) | Admitting: Lab

## 2012-05-09 ENCOUNTER — Ambulatory Visit: Payer: Managed Care, Other (non HMO) | Admitting: Oncology

## 2013-03-19 ENCOUNTER — Encounter: Payer: Self-pay | Admitting: Radiation Oncology

## 2013-03-19 ENCOUNTER — Telehealth: Payer: Self-pay | Admitting: *Deleted

## 2013-03-19 NOTE — Telephone Encounter (Signed)
Lori Mcmillan will try and get patient to come in tomorrow under PUT appt od=r Friday early am, since MD is out of office this Wed., and Thursd. 9:12 AM

## 2013-03-19 NOTE — Telephone Encounter (Signed)
Returned call from message left Sunday with operator, spoke  with ms, [place], shwe stated: Saturday afternoon after I took my shower, I noticed my right breast was red, had a rash, warm to touch and itching terribly, the area was concentrated where my Radiation was done  Back last June 2013, " "It has calmed down some," asked if she had a fever, "NO,Fever" states patient, said she was using radiaplex again: hasn't tried cortisone cream for itching, asked if I could schedule f/u appt to See Md tomorrow or Thursday, "she would like to try cortisone cream first, but would make the Thursday appt if she feels this has cleard up will cancel, transferred cal to [place] to schedule appt, pateint rthanked me for calling back so soon 8:49 AM

## 2013-03-20 ENCOUNTER — Ambulatory Visit
Admission: RE | Admit: 2013-03-20 | Discharge: 2013-03-20 | Disposition: A | Discharge status: Home / Self Care | Payer: Managed Care, Other (non HMO) | Source: Ambulatory Visit | Attending: Radiation Oncology | Admitting: Radiation Oncology

## 2013-03-20 ENCOUNTER — Encounter: Payer: Self-pay | Admitting: Radiation Oncology

## 2013-03-20 ENCOUNTER — Telehealth: Payer: Self-pay | Admitting: *Deleted

## 2013-03-20 VITALS — BP 138/60 | HR 77 | Temp 98.2°F | Resp 20 | Wt 129.4 lb

## 2013-03-20 DIAGNOSIS — C50411 Malignant neoplasm of upper-outer quadrant of right female breast: Secondary | ICD-10-CM

## 2013-03-20 HISTORY — DX: Personal history of other medical treatment: Z92.89

## 2013-03-20 HISTORY — DX: Personal history of irradiation: Z92.3

## 2013-03-20 NOTE — Progress Notes (Signed)
Department of Radiation Oncology  Phone:  [phone] Fax:        [phone]   Name: Lori Mcmillan MRN: [id number]  DOB: 1960-12-03  Date: 03/20/2013  Follow Up Visit Note  Diagnosis: T1a right breast cancer  Summary and Interval since last radiation: 1 year  Interval History: Lori Mcmillan presents today for followup. She missed her after treatment followup and also canceled her followup appointment with medical oncology. She did stop taking her tamoxifen due to side effects. She is not interested in restarting. She had her last mammogram in February of this year. She is done well since that time. His actually lost about 60 pounds due to some dietary changes. She asked to be seen today as she experienced a bright red feverish breast on Saturday night. She reports no strange activities on Saturday other than cleaning her house and getting ready for dinner party. She denies any new detergents, lotions, foods, hot pads or hot tabs. She describes this as feeling like her breast was tender her nipple was swollen and there is a burning type pain which radiated back into her right shoulder. The redness was in the shape of the radiation treatment field. Her only known allergies is to [place] nuts. She is better now. Her skin is healed up her nipple is normal. She has a slight burning sensation in the breast but nothing else. She has not palpated any new masses. She has no complaints of her left breast. She treated this area with cortisone cream which didn't seem to really make a difference as the itching seem to be inside the breast rather than on the skin. He also tried using Radiaplex cream.  Allergies: No Known Allergies  Medications:  Current Outpatient Prescriptions  Medication Sig Dispense Refill  . LORazepam (ATIVAN) 1 MG tablet Take 1 mg by mouth every 8 (eight) hours as needed. For anxiety      . emollient (BIAFINE) cream Apply topically as needed.      . metoprolol (LOPRESSOR) 50 MG  tablet Take 50 mg by mouth daily.       . metoprolol succinate (TOPROL-XL) 50 MG 24 hr tablet       . non-metallic deodorant (ALRA) MISC Apply 1 application topically daily as needed.      Marland Kitchen omeprazole (PRILOSEC) 20 MG capsule Take 20 mg by mouth daily as needed. For acid reflux      . oxyCODONE-acetaminophen (PERCOCET) 5-325 MG per tablet       . sertraline (ZOLOFT) 25 MG tablet Take 1 tablet (25 mg total) by mouth daily.  30 tablet  2  . venlafaxine XR (EFFEXOR-XR) 75 MG 24 hr capsule Take 1 capsule (75 mg total) by mouth daily.  90 capsule  12  . Wound Cleansers (RADIAPLEX EX) Apply topically.       No current facility-administered medications for this encounter.   Facility-Administered Medications Ordered in Other Encounters  Medication Dose Route Frequency Provider Last Rate Last Dose  . topical emolient (BIAFINE) emulsion   Topical PRN Lurline Hare, MD   1 application at 03/21/12 1730    Physical Exam:  Filed Vitals:   03/20/13 1133  BP: 138/60  Pulse: 77  Temp: 98.2 F (36.8 C)  Resp: 20   she is a pleasant female in no distress who is markedly thin compared to the last time I saw her. She has no palpable axillary adenopathy bilaterally. No cervical or supraclavicular adenopathy. She has no palpable abnormalities of the left  breast. The right breast has a well-healed incision in the upper outer quadrants. Her nipple appears normal although there is a small site of excoriation at about the 10:00 position on the areola. There is some very minimal faint pink rash in the inframammary fold extending up towards the areola.  IMPRESSION: [name] is a 52 y.o. female status post breast conservation with a radiation recall reaction of unclear etiology  PLAN:  At unclear what causes this radiation recall reaction. Is very typical. My suspicion for inflammatory cancer is significantly less likely due to the fact that it is improved over the past several days. I offered her a 5 day steroid  taper which she is declined. She will take Benadryl as needed for the itching. I've also is ordered a mammogram and will followup with her in 2 weeks. 2 of them returned to normal followup for her early stage right breast cancer.    Lurline Hare, MD

## 2013-03-20 NOTE — Progress Notes (Addendum)
pateint here f/u right breast rad txs: 02/24/12-04/11/12, alert,oriented x3, only med taking now is lorazapam prn, next to nipple scratched area, red, no rash seen now, patient had used hydrogen peroxide, c/o intense itching, has tried cortisone cream, not relieving the itching, also c/o burning in breast to shoulder , stated started using radiaplex again,still had some cream in tube  11:35 AM

## 2013-03-20 NOTE — Telephone Encounter (Signed)
Faxed order to [facility] - confirmation received where order went through

## 2013-04-03 ENCOUNTER — Ambulatory Visit
Admission: RE | Admit: 2013-04-03 | Payer: Managed Care, Other (non HMO) | Source: Ambulatory Visit | Admitting: Radiation Oncology

## 2013-05-30 ENCOUNTER — Emergency Department (HOSPITAL_BASED_OUTPATIENT_CLINIC_OR_DEPARTMENT_OTHER): Payer: Managed Care, Other (non HMO)

## 2013-05-30 ENCOUNTER — Emergency Department (HOSPITAL_BASED_OUTPATIENT_CLINIC_OR_DEPARTMENT_OTHER)
Admission: EM | Admit: 2013-05-30 | Discharge: 2013-05-30 | Disposition: A | Discharge status: Home / Self Care | Payer: Managed Care, Other (non HMO) | Attending: Emergency Medicine | Admitting: Emergency Medicine

## 2013-05-30 ENCOUNTER — Encounter (HOSPITAL_BASED_OUTPATIENT_CLINIC_OR_DEPARTMENT_OTHER): Payer: Self-pay | Admitting: *Deleted

## 2013-05-30 DIAGNOSIS — Z853 Personal history of malignant neoplasm of breast: Secondary | ICD-10-CM | POA: Insufficient documentation

## 2013-05-30 DIAGNOSIS — R002 Palpitations: Secondary | ICD-10-CM | POA: Insufficient documentation

## 2013-05-30 DIAGNOSIS — R072 Precordial pain: Secondary | ICD-10-CM | POA: Insufficient documentation

## 2013-05-30 DIAGNOSIS — F411 Generalized anxiety disorder: Secondary | ICD-10-CM | POA: Insufficient documentation

## 2013-05-30 DIAGNOSIS — R079 Chest pain, unspecified: Secondary | ICD-10-CM

## 2013-05-30 DIAGNOSIS — Z8659 Personal history of other mental and behavioral disorders: Secondary | ICD-10-CM | POA: Insufficient documentation

## 2013-05-30 DIAGNOSIS — R6883 Chills (without fever): Secondary | ICD-10-CM | POA: Insufficient documentation

## 2013-05-30 DIAGNOSIS — R197 Diarrhea, unspecified: Secondary | ICD-10-CM | POA: Insufficient documentation

## 2013-05-30 DIAGNOSIS — Z923 Personal history of irradiation: Secondary | ICD-10-CM | POA: Insufficient documentation

## 2013-05-30 DIAGNOSIS — R6884 Jaw pain: Secondary | ICD-10-CM | POA: Insufficient documentation

## 2013-05-30 DIAGNOSIS — F17200 Nicotine dependence, unspecified, uncomplicated: Secondary | ICD-10-CM | POA: Insufficient documentation

## 2013-05-30 DIAGNOSIS — R209 Unspecified disturbances of skin sensation: Secondary | ICD-10-CM | POA: Insufficient documentation

## 2013-05-30 LAB — BASIC METABOLIC PANEL
BUN: 10 mg/dL (ref 6–23)
Calcium: 9.9 mg/dL (ref 8.4–10.5)
GFR calc Af Amer: >90 mL/min (ref >90)
GFR calc non Af Amer: >90 mL/min (ref >90)
Glucose, Bld: 97 mg/dL (ref 70–99)
Sodium: 141 mEq/L (ref 135–145)

## 2013-05-30 LAB — CBC WITH DIFFERENTIAL/PLATELET
Basophils Relative: 0 % (ref 0–1)
Eosinophils Absolute: 0.1 10*3/uL (ref 0.0–0.7)
Eosinophils Relative: 1 % (ref 0–5)
Lymphs Abs: 2.3 10*3/uL (ref 0.7–4.0)
MCH: 30.1 pg (ref 26.0–34.0)
MCHC: 32.4 g/dL (ref 30.0–36.0)
MCV: 92.8 fL (ref 78.0–100.0)
Neutrophils Relative %: 73 % (ref 43–77)
Platelets: 308 10*3/uL (ref 150–400)
RBC: 4.45 MIL/uL (ref 3.87–5.11)

## 2013-05-30 LAB — TROPONIN I: Troponin I: <0.3 ng/mL (ref <0.30)

## 2013-05-30 MED ORDER — ASPIRIN 81 MG PO CHEW
324.0000 mg | CHEWABLE_TABLET | Freq: Once | ORAL | Status: AC
  Administered 2013-05-30: 324 mg via ORAL
  Filled 2013-05-30: qty 4

## 2013-05-30 MED ORDER — LORAZEPAM 2 MG/ML IJ SOLN
1.0000 mg | Freq: Once | INTRAMUSCULAR | Status: DC
  Filled 2013-05-30: qty 1

## 2013-05-30 MED ORDER — NITROGLYCERIN 0.4 MG SL SUBL
0.4000 mg | SUBLINGUAL_TABLET | SUBLINGUAL | Status: DC | PRN
  Filled 2013-05-30: qty 25

## 2013-05-30 NOTE — ED Notes (Signed)
Pt states that Sunday she noticed that she was having left sided jaw pain.  Pt also reports chills and diarrhea.

## 2013-05-30 NOTE — ED Notes (Signed)
Patient transported to X-ray 

## 2013-05-30 NOTE — ED Provider Notes (Signed)
This chart was scribed for [name], MD by Bennett Scrape, ED Scribe. This patient was seen in room MH07/MH07 and the patient's care was started at 4:107 PM.  HPI Comments: Lori Mcmillan is a 52 y.o. female who presents to the Emergency Department complaining of intermittent substernal CP with associated paraesthesias in the left arm felt over the past 3 days. She states that the episodes have been lasting a few minutes at a time since onset. She denies having a cardiac history or prior stress test. She reports taking lorazepam PRN for anxiety and admits that she has experienced CP with anxiety attacks before but denies any similatires. She denies having any prior episodes and denies having CP currently. She denies any known triggers. She states her mother has an extensive cardiac history. She also reports constant, throbbing left upper and lower jaw pain felt constantly over the past 3 days. She denies any recent falls or trauma to the jaw. She denies having trouble eating or swallowing. She denies nausea, SOB and dental pain as associated symptoms. She has a h/o breast CA last year that went into remission with a right lumpectomy and radiation. She states that she is currently CA free.   PCP is with [facility]  PE HENT: No dental abscesses, no trismus, no tenderness to palpation of the mandible  CARDIOVASCULAR: regular rate, normal rhythm, chest wall is non-tender  ABDOMEN: Soft, no tenderness  EKG nsr.  Chest pain is not reproducible. Jaw pain is constant x 3 days. Atypical for ACS.  I personally performed the services described in this documentation, which was scribed in my presence. The recorded information has been reviewed and is accurate.   [name], MD 05/30/13 [phone]

## 2013-05-30 NOTE — ED Notes (Signed)
Pa  at bedside. 

## 2013-05-30 NOTE — ED Provider Notes (Signed)
CSN: [id number]     Arrival date & time 05/30/13  1453 History     First MD Initiated Contact with Patient 05/30/13 1514     Chief Complaint  Patient presents with  . Jaw Pain   (Consider location/radiation/quality/duration/timing/severity/associated sxs/prior Treatment) HPI Comments: Patient is a 52 year old female with a past medical history of anxiety/depression and previous right breast cancer who presents with a 3 day history of chest pain. The symptoms started gradually and remained intermittent since the onset. Patient reports the chest pain started without known trigger and will last a few minutes before spontaneously resolving. The pain is "squeezing" and severe and located in her central chest without radiation. Patient reports associated left jaw pain, anxiety and left arm numbness and tingling. No aggravating/alleviating factors. No other associated symptoms. Patient is a current everyday smoker.    Past Medical History  Diagnosis Date  . Palpitations     takes Metoprolol for palpitations  . Anxiety   . Depression   . Cancer     Rt breast  . History of radiation therapy 02/24/12-04/11/12    right breast/ 45Gy@1 .8Gyx42fx/boost=16Gy@2  [id number]  . H/O mammogram 11/21/12    B/L    Past Surgical History  Procedure Laterality Date  . Wisdom tooth extraction  1990  . Appendectomy  2008    emergency  . Breast lumpectomy  01/04/12    right breast   Family History  Problem Relation Age of Onset  . Anesthesia problems Mother   . Cancer Mother     breast  . Cancer Maternal Aunt     breast  . Cancer Paternal Grandfather     colon   History  Substance Use Topics  . Smoking status: Current Every Day Smoker -- 0.50 packs/day    Types: Cigarettes    Last Attempt to Quit: 11/19/2011  . Smokeless tobacco: Never Used     Comment: has not smoked since 10/19/2011  . Alcohol Use: 1.2 oz/week    2 Shots of liquor per week   OB History   Grav Para Term Preterm Abortions TAB SAB Ect  Mult Living                 Review of Systems  HENT:       Left jaw pain.   Respiratory: Positive for chest tightness.   Cardiovascular: Positive for chest pain.  Neurological: Positive for numbness.  Psychiatric/Behavioral: The patient is nervous/anxious.   All other systems reviewed and are negative.    Allergies  Review of patient's allergies indicates no known allergies.  Home Medications   Current Outpatient Rx  Name  Route  Sig  Dispense  Refill  . LORazepam (ATIVAN) 1 MG tablet   Oral   Take 1 mg by mouth every 8 (eight) hours as needed. For anxiety         . metoprolol (LOPRESSOR) 50 MG tablet   Oral   Take 50 mg by mouth daily.          . metoprolol succinate (TOPROL-XL) 50 MG 24 hr tablet               . non-metallic deodorant (ALRA) MISC   Topical   Apply 1 application topically daily as needed.         Marland Kitchen omeprazole (PRILOSEC) 20 MG capsule   Oral   Take 20 mg by mouth daily as needed. For acid reflux         . oxyCODONE-acetaminophen (  PERCOCET) 5-325 MG per tablet               . EXPIRED: sertraline (ZOLOFT) 25 MG tablet   Oral   Take 1 tablet (25 mg total) by mouth daily.   30 tablet   2   . Wound Cleansers (RADIAPLEX EX)   Apply externally   Apply topically.          BP 151/71  Pulse 75  Temp(Src) 98.4 F (36.9 C) (Oral)  Resp 17  SpO2 98%  LMP 05/11/2013 Physical Exam  Nursing note and vitals reviewed. Constitutional: She is oriented to person, place, and time. She appears well-developed and well-nourished. No distress.  HENT:  Head: Normocephalic and atraumatic.  Mouth/Throat: Oropharynx is clear and moist. No oropharyngeal exudate.  No trismus. No tooth tenderness to percussion. No left jaw tenderness to palpation or obvious deformity.   Eyes: Conjunctivae and EOM are normal. Pupils are equal, round, and reactive to light.  Neck: Normal range of motion.  Cardiovascular: Normal rate and regular rhythm.  Exam  reveals no gallop and no friction rub.   No murmur heard. Pulmonary/Chest: Effort normal and breath sounds normal. She has no wheezes. She has no rales. She exhibits no tenderness.  Abdominal: Soft. She exhibits no distension. There is no tenderness. There is no rebound and no guarding.  Musculoskeletal: Normal range of motion.  Neurological: She is alert and oriented to person, place, and time. Coordination normal.  Speech is goal-oriented. Moves limbs without ataxia.   Skin: Skin is warm and dry.  Psychiatric: She has a normal mood and affect. Her behavior is normal.    ED Course   Procedures (including critical care time)  Labs Reviewed  CBC WITH DIFFERENTIAL - Abnormal; Notable for the following:    WBC 12.6 (*)    Neutro Abs 9.2 (*)    All other components within normal limits  BASIC METABOLIC PANEL  TROPONIN I  TROPONIN I   Dg Chest 2 View  05/30/2013   *RADIOLOGY REPORT*  Clinical Data: Chest pain, headache, body chills  CHEST - 2 VIEW  Comparison: None.  Findings: No active infiltrate or effusion is seen.  Mediastinal contours appear normal.  The heart is within normal limits in size. No bony abnormality is seen.  Surgical clips overlie the right breast and right axilla.  IMPRESSION: No active lung disease.   Original Report Authenticated By: Dwyane Dee, M.D.   1. Chest pain   2. Jaw pain     MDM  3:33 PM Labs, chest xray, and EKG pending. Vitals stable and patient afebrile. Patient will have nitro, aspirin and ativan.   4:41 PM Labs, chest xray and troponin unremarkable. Patient's EKG shows new onset 1st degree AV block since last year, which we are not concerned about in the acute setting. Patient resting comfortably now. Patient will have another troponin in 3 hours. If negative, patient will be discharged.   6:54 PM Second troponin negative. Patient will be discharged with instructions to follow up with PCP. Vitals stable and patient afebrile. Patient's symptoms  likely due to anxiety.   Lori Mcmillan, [place] 05/30/13 [phone]

## 2013-05-30 NOTE — ED Notes (Signed)
Ativan discont pt drove self and insist on driving self home

## 2013-05-31 NOTE — ED Provider Notes (Signed)
Medical screening examination/treatment/procedure(s) were conducted as a shared visit with non-physician practitioner(s) and myself.  I personally evaluated the patient during the encounter  See my additional note  [name], MD 05/31/13 [phone]

## 2013-09-05 ENCOUNTER — Encounter: Payer: Self-pay | Admitting: Family Medicine

## 2013-09-05 ENCOUNTER — Ambulatory Visit (INDEPENDENT_AMBULATORY_CARE_PROVIDER_SITE_OTHER): Payer: Managed Care, Other (non HMO) | Admitting: Family Medicine

## 2013-09-05 VITALS — BP 127/77 | HR 73 | Temp 98.2°F | Resp 18 | Ht 63.0 in | Wt 124.0 lb

## 2013-09-05 DIAGNOSIS — Z23 Encounter for immunization: Secondary | ICD-10-CM

## 2013-09-05 DIAGNOSIS — H6993 Unspecified Eustachian tube disorder, bilateral: Secondary | ICD-10-CM

## 2013-09-05 DIAGNOSIS — F17200 Nicotine dependence, unspecified, uncomplicated: Secondary | ICD-10-CM | POA: Insufficient documentation

## 2013-09-05 DIAGNOSIS — H6980 Other specified disorders of Eustachian tube, unspecified ear: Secondary | ICD-10-CM

## 2013-09-05 DIAGNOSIS — H6983 Other specified disorders of Eustachian tube, bilateral: Secondary | ICD-10-CM

## 2013-09-05 DIAGNOSIS — Z0000 Encounter for general adult medical examination without abnormal findings: Secondary | ICD-10-CM

## 2013-09-05 DIAGNOSIS — H6990 Unspecified Eustachian tube disorder, unspecified ear: Secondary | ICD-10-CM

## 2013-09-05 DIAGNOSIS — Z1211 Encounter for screening for malignant neoplasm of colon: Secondary | ICD-10-CM | POA: Insufficient documentation

## 2013-09-05 HISTORY — DX: Other specified disorders of Eustachian tube, unspecified ear: H69.80

## 2013-09-05 HISTORY — DX: Unspecified eustachian tube disorder, unspecified ear: H69.90

## 2013-09-05 HISTORY — DX: Nicotine dependence, unspecified, uncomplicated: F17.200

## 2013-09-05 NOTE — Assessment & Plan Note (Signed)
Reviewed age and gender appropriate health maintenance issues (prudent diet, regular exercise, health risks of tobacco and excessive alcohol, use of seatbelts, fire alarms in home, use of sunscreen).  Also reviewed age and gender appropriate health screening as well as vaccine recommendations. Flu vaccine IM today. We set pt up with [facility] today for her GI eval/colon cancer screening. [place] labs ordered-to be done at [facility] [place]. She reports she got last pap/pelvic 2012 w/PCP (no GYN) and she reports all normal paps in the past (including HPV testing per pt). Will obtain old records, but based off of this info, her next pap can be anywhere in the time frame of 2015-2017.

## 2013-09-05 NOTE — Assessment & Plan Note (Signed)
Discussed dx, typical waxing and waning sx's. Encouraged pt to use her nasonex on a daily basis and supplement with OTC saline nasal spray prn.

## 2013-09-05 NOTE — Patient Instructions (Signed)

## 2013-09-05 NOTE — Progress Notes (Signed)
Office Note 09/05/2013  CC:  Chief Complaint  Patient presents with  . Establish Care    HPI:  Lori Mcmillan is a 52 y.o. White female who is here to establish care. Patient's most recent primary MD: Brooks Sailors in [place] Old records were reviewed prior to or during today's visit.  Lost job (has new one now with no health ins coverage), asks for colon cancer screening referral before end of this month when her former job's insurance runs out.  Asks for CPE today.  Reports 1 mo hx of burning/pressure in nasal,sinus, eustacian tube region.  No sinus pain/face pain/HA's.   No other acute complaints.  Past Medical History  Diagnosis Date  . Palpitations     has taken Metoprolol for palpitations in the past.  . Anxiety     occ lorazepam  . Depression     zoloft in past--?wt gain.  . 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  Marland Kitchen History of radiation therapy 02/24/12-04/11/12    right breast/ 45Gy@1 .8Gyx1fx/boost=16Gy@2  Gya38fx.  Latest mammo and u/s 03/2013--normal.  . H/O mammogram 11/21/12    B/L   . Taste impairment     s/p radiation therapy  . Tobacco dependence 09/05/2013    Past Surgical History  Procedure Laterality Date  . Wisdom tooth extraction  1990  . Appendectomy  2008    emergency  . Breast lumpectomy  01/04/12    right breast    Family History  Problem Relation Age of Onset  . Anesthesia problems Mother   . Cancer Mother     breast  . Cancer Maternal Aunt     breast  . Cancer Paternal Grandfather     colon    History   Social History  . Marital Status: Single    Spouse Name: N/A    Number of Children: N/A  . Years of Education: N/A   Occupational History  . Not on file.   Social History Main Topics  . Smoking status: Current Every Day Smoker -- 0.50 packs/day    Types: Cigarettes    Last Attempt to Quit: 11/19/2011  . Smokeless tobacco: Never  Used     Comment: has not smoked since 10/19/2011  . Alcohol Use: 1.2 oz/week    2 Shots of liquor per week  . Drug Use: No  . Sexual Activity: Not Currently   Other Topics Concern  . Not on file   Social History Narrative   Single, no children.   Has a cat and a dog.   Orig from [place] in [place].   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   Tob: 20 pack-yr hx, current as of 09/05/13.   Alc: beer 1-2 per day avg, whisky with water 1 glass hs.   High protein/low carb diet.   Walks 3 x/week with dog.  Some biking.   MEDS: none  No Known Allergies  ROS Review of Systems  Constitutional: Negative for fever, chills, appetite change and fatigue.  HENT: Positive for sinus pressure (see HPI). Negative for congestion, dental problem, ear pain and sore throat.   Eyes: Negative for discharge, redness and visual disturbance.  Respiratory: Negative for cough, chest tightness, shortness of breath and wheezing.   Cardiovascular: Negative for chest pain, palpitations and leg swelling.  Gastrointestinal: Negative for nausea, vomiting, abdominal pain, diarrhea and blood in stool.  Genitourinary: Negative for dysuria,  urgency, frequency, hematuria, flank pain and difficulty urinating.  Musculoskeletal: Negative for arthralgias, back pain, joint swelling, myalgias and neck stiffness.  Skin: Negative for pallor and rash.  Neurological: Negative for dizziness, speech difficulty, weakness and headaches.  Hematological: Negative for adenopathy. Does not bruise/bleed easily.  Psychiatric/Behavioral: Negative for confusion and sleep disturbance. The patient is not nervous/anxious.      PE; Blood pressure 127/77, pulse 73, temperature 98.2 F (36.8 C), temperature source Temporal, resp. rate 18, height 5\' 3"  (1.6 m), weight 124 lb (56.246 kg), SpO2 100.00%. Gen: Alert, well appearing.  Patient is oriented to person, place, time, and situation. AFFECT: pleasant, lucid thought and speech. ENT: Ears: EACs  clear, normal epithelium.  TMs with good light reflex and landmarks bilaterally.  Eyes: no injection, icteris, swelling, or exudate.  EOMI, PERRLA. Nose: no drainage or turbinate edema/swelling.  No injection or focal lesion.  Mouth: lips without lesion/swelling.  Oral mucosa pink and moist.  Dentition intact and without obvious caries or gingival swelling.  Oropharynx without erythema, exudate, or swelling.  Neck: supple/nontender.  No LAD, mass, or TM.  Carotid pulses 2+ bilaterally, without bruits. CV: RRR, no m/r/g.   LUNGS: CTA bilat, nonlabored resps, good aeration in all lung fields. ABD: soft, NT, ND, BS normal.  No hepatospenomegaly or mass.  No bruits. EXT: no clubbing, cyanosis, or edema.  Musculoskeletal: no joint swelling, erythema, warmth, or tenderness.  ROM of all joints intact. Skin - no sores or suspicious lesions or rashes or color changes  Pertinent labs:  None today  ASSESSMENT AND PLAN:   NEW PT; obtain old records.  Health maintenance examination Reviewed age and gender appropriate health maintenance issues (prudent diet, regular exercise, health risks of tobacco and excessive alcohol, use of seatbelts, fire alarms in home, use of sunscreen).  Also reviewed age and gender appropriate health screening as well as vaccine recommendations. Flu vaccine IM today. We set pt up with [facility] today for her GI eval/colon cancer screening. [place] labs ordered-to be done at [facility] [place]. She reports she got last pap/pelvic 2012 w/PCP (no GYN) and she reports all normal paps in the past (including HPV testing per pt). Will obtain old records, but based off of this info, her next pap can be anywhere in the time frame of 2015-2017.  Eustachian tube dysfunction Discussed dx, typical waxing and waning sx's. Encouraged pt to use her nasonex on a daily basis and supplement with OTC saline nasal spray prn.  Encouraged total smoking cessation today.  An After Visit Summary was  printed and given to the patient.  Return in about 1 year (around 09/05/2014) for CPE (fasting).

## 2013-09-07 LAB — HM COLONOSCOPY

## 2013-09-18 ENCOUNTER — Other Ambulatory Visit: Payer: Self-pay | Admitting: Family Medicine

## 2013-09-18 LAB — LIPID PANEL
Cholesterol: 194 mg/dL (ref 0–200)
HDL: 75 mg/dL (ref >39)
Total CHOL/HDL Ratio: 2.6 Ratio
Triglycerides: 75 mg/dL (ref <150)
VLDL: 15 mg/dL (ref 0–40)

## 2013-09-18 LAB — CBC WITH DIFFERENTIAL/PLATELET
Basophils Absolute: 0 10*3/uL (ref 0.0–0.1)
Basophils Relative: 0 % (ref 0–1)
Eosinophils Relative: 2 % (ref 0–5)
HCT: 39.6 % (ref 36.0–46.0)
MCHC: 32.8 g/dL (ref 30.0–36.0)
MCV: 89.6 fL (ref 78.0–100.0)
Monocytes Absolute: 0.7 10*3/uL (ref 0.1–1.0)
Platelets: 325 10*3/uL (ref 150–400)
RDW: 13.5 % (ref 11.5–15.5)

## 2013-09-18 LAB — COMPREHENSIVE METABOLIC PANEL
AST: 13 U/L (ref 0–37)
BUN: 11 mg/dL (ref 6–23)
Calcium: 9 mg/dL (ref 8.4–10.5)
Chloride: 103 mEq/L (ref 96–112)
Creat: 0.64 mg/dL (ref 0.50–1.10)

## 2013-09-18 LAB — TSH: TSH: 3.123 u[IU]/mL (ref 0.350–4.500)

## 2016-04-02 ENCOUNTER — Encounter: Payer: Self-pay | Admitting: Genetic Counselor

## 2016-10-18 HISTORY — PX: BRAIN TUMOR EXCISION: SHX577

## 2017-02-09 ENCOUNTER — Other Ambulatory Visit: Payer: Self-pay | Admitting: Internal Medicine

## 2017-02-09 DIAGNOSIS — R42 Dizziness and giddiness: Secondary | ICD-10-CM

## 2017-10-07 DIAGNOSIS — D329 Benign neoplasm of meninges, unspecified: Secondary | ICD-10-CM | POA: Insufficient documentation

## 2017-10-19 HISTORY — PX: CRANIECTOMY: SHX331

## 2017-11-09 ENCOUNTER — Encounter: Payer: Self-pay | Admitting: Radiation Oncology

## 2017-11-10 ENCOUNTER — Other Ambulatory Visit: Payer: Self-pay | Admitting: Radiation Therapy

## 2017-11-10 ENCOUNTER — Ambulatory Visit
Admission: RE | Admit: 2017-11-10 | Discharge: 2017-11-10 | Disposition: A | Discharge status: Home / Self Care | Payer: Self-pay | Source: Ambulatory Visit | Attending: Radiation Oncology | Admitting: Radiation Oncology

## 2017-11-10 DIAGNOSIS — D329 Benign neoplasm of meninges, unspecified: Secondary | ICD-10-CM

## 2017-11-10 NOTE — Progress Notes (Signed)
Location/Histology of Brain Tumor:  10/19/17 Biopsy at [facility].  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   Past or anticipated interventions, if any, per neurosurgery:  10/19/17 Dr. Placido Sou Procedures:  Right craniotomy for resection of tumor (10/19/17)   Past or anticipated interventions, if any, per medical oncology:  None  Dose of Decadron, if applicable: She has completed her decadron as ordered.    Recent neurologic symptoms, if any:   Seizures: No  Headaches: She reports a slight headache since surgery  Nausea: No  Dizziness/ataxia: No   Difficulty with hand coordination: No  Focal numbness/weakness: She reports numbness to her hands and tongue.   Visual deficits/changes: She report vision changes close up. She is unable to use a computer or fill out forms. It has not improved since surgery.   Confusion/Memory deficits: No  She reports her right ear "feels full of fluid"       she reports taste changes.    SAFETY ISSUES: Prior radiation? Yes,  Radiation treatment dates:  02/24/2012-04/11/2012 Site/dose:    Right breast / 45 Gray at 1.8 [place] Cables per fraction x 25 fractions  Right breast boost / 16 Gray at [employer] per fraction x 8 fractions  Pacemaker/ICD? No  Possible current pregnancy? No  Is the patient on methotrexate? No  Additional Complaints / other details:  11/09/17 Radiation consult with Dr. Crisoforo Oxford at [facility]  BP 125/87   Pulse 88   Temp 98.1 F (36.7 C)   Ht 5\' 3"  (1.6 m)   Wt 158 lb 6.4 oz (71.8 kg)   SpO2 98% Comment: room air  BMI 28.06 kg/m    Wt Readings from Last 3 Encounters:  11/16/17 158 lb 6.4 oz (71.8 kg)  09/05/13 124  lb (56.2 kg)  03/20/13 129 lb 6.4 oz (58.7 kg)

## 2017-11-16 ENCOUNTER — Ambulatory Visit
Admission: RE | Admit: 2017-11-16 | Discharge: 2017-11-16 | Disposition: A | Discharge status: Home / Self Care | Payer: Self-pay | Source: Ambulatory Visit | Attending: Radiation Oncology | Admitting: Radiation Oncology

## 2017-11-16 ENCOUNTER — Encounter: Payer: Self-pay | Admitting: Radiation Oncology

## 2017-11-16 VITALS — BP 125/87 | HR 88 | Temp 98.1°F | Ht 63.0 in | Wt 158.4 lb

## 2017-11-16 DIAGNOSIS — D420 Neoplasm of uncertain behavior of cerebral meninges: Secondary | ICD-10-CM | POA: Insufficient documentation

## 2017-11-16 DIAGNOSIS — K1379 Other lesions of oral mucosa: Secondary | ICD-10-CM

## 2017-11-16 DIAGNOSIS — H6981 Other specified disorders of Eustachian tube, right ear: Secondary | ICD-10-CM

## 2017-11-16 NOTE — Progress Notes (Signed)
Radiation Oncology         [phone] ________________________________  Initial outpatient Consultation  Name: Lori Mcmillan MRN: [id number]  Date: 11/16/2017  DOB: July 19, 1961  CC:McGowen, Adrian Blackwater, MD  Clare Gandy, MD   REFERRING PHYSICIAN: Clare Gandy, MD  DIAGNOSIS:    ICD-10-CM   1. 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 Mouth sore K13.79 Ambulatory referral to ENT  3. Dysfunction of right eustachian tube H69.81 Ambulatory referral to ENT    CHIEF COMPLAINT: Here to discuss management of meningioma - right temporal  HISTORY OF PRESENT ILLNESS::Lori Mcmillan is a 56 y.o. female who presented with headaches and visual issues initially and presented to her eye doctor for further evaluation. She notes that on 10/06/17, her left eye vision was pronouncedly curved and not linear. She was evaluated by her opthamalogist, Dr. Satira Mccallum at [facility] and was then sent immediately to [facility] for further evaluation and admittance. She was informed that they wanted to keep her. She was then discharged upon her request with Rx steroids and pain medications. She was informed to come back on 10/17/17 for admittance to the hospital for surgery. She reports that she was on keppra post-operatively. She is not on any hormone replacement therapy. Her mother and several of her aunts had breast cancer, but she denies there being a family member with meningioma.   She had a CT head without contrast on 10/07/17 with results showing: Large right parietal lobe hypodensity mass with compression of the right lateral ventricle, leftward midline shift, and left ventricular hydrocephalus. Recommend MRI brain with and without contrast brain tumor protocol for further evaluation. Following that she had a MRI Brain on 10/07/17 with results of: Large right parietal mass with compression of the right lateral ventricle, leftward midline  shift, and left ventricular acute hydrocephalus. Favor high-grade glioma. Attending note: This large tumor appears extra-axial, arises from right petrous ridge and partly fed by engorged right middle meningeal artery. Meningioma is more favored than high-grade glioma. The mass effect of this tumor causes mild transtentorial herniation of right uncus and mesial temporal region. The adjacent right transverse-sigmoid sinuses are patent and not involved. Swelling bilateral optic discs, suggesting increased intracranial pressure. I have viewed pre and post op images.  IR Embolization Intracranial Spinal on 10/17/17 with results of: Significant tumor blush with main supply from the right middle meningeal artery and to lesser extent from the tentorial branches of the right posterior cerebral artery and the right meningohypophyseal trunk. Successful pre-operative embolization using PVA and coils in the right middle meningeal artery with significant reduction of the tumor blush. Attempted embolization of the residual supply from tentorial branches of the right posterior cerebral artery.  She had a craniotomy for excision of a meningoma-supratentorial completed on 10/19/17 by Dr. Shirlean Mylar at [facility] 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 Microscopic examination of specimens A through C demonstrates a pleomorphic spindle cell neoplasm that diffusely infiltrates in the surrounding dense fibrous tissue consistent with dura and also, focally, invades in the surrounding brain parenchyma.  Overall, the tumor cells show a range of architectural patterns from more fascicular architecture to whorls to loose haphazard collections of tumor cells.  The tumor cells show moderate nuclear atypia including  occasional giant nuclei.  Focal "small cell change," is present, but no definitive necrosis is seen.  Rare mitoses identified (1 per 10 high-power fields). Immunohistochemical stains performed on block C3 and demonstrate the tumor is diffusely positive for progesterone receptor with patchy positivity for EMA.  Immunohistochemical stain for CD34 outlines blood vessels while immunohistochemical stain for S100 and GFAP are negative.  Overall the appearance of the tumor, combined with the radiographic findings, and immunohistochemical results are most consistent with an atypical meningioma (WHO grade II).  MRI Brain on 10/22/17 showed: Postsurgical sequela status post right craniotomy for meningioma resection. Improved midline shift measuring 0.7 cm to the left. Small acute left cerebellar infarcts.  She finished dexamethasone post-operatively and completed her dose on 1/15. She had a Radiation Oncology consult with Dr. Crisoforo Oxford at [facility] on 11/09/17. She was previously treated for right breast cancer at [facility] approximately 5 years ago with Dr. Lance Morin. She had radiation recall to her breast that healed on its own without prescription steroid. She gets her mammograms yearly and she is not on any anti-estrogen therapy. She didn't get chemotherapy nor did she use tamoxifen.  Her Neurosurgeon, Dr. Pati Gallo recommended that the patient follow up with her opthamalogist post-operatively. Dr. Pati Gallo also recommended follow up on 01/19/18 with a MRI Brain W Wo Contrast. She doesn't have a follow up appointment with her opthamalogist at this time because she wanted to follow up in the office and discuss if there would be potential vision loss.   On review of systems, she reports numbness to her teeth and tongue entirely, tingling sensation to her face bilaterally, right ear congestion with mild hearing loss, visual impairment (some letters run on and no near sighted vision) since 10/06/17, she feels "foggy"  within her head, bilateral hand numbness since surgery, mild HA since surgery, anxiety-related central CP sensation, short-term memory loss, and ulcer to inner mouth. Her visual impairment is not alleviated with closing one eye, and her far sighted vision is improved. She reports that she doesn't typically have headaches, but reports that majority of her pain is due to the external incision. She had her staples removed on 11/09/17. She denies SOB, weakness, abnormal gait, long-term memory loss, rash, ankle/arm swelling, urinary issues, constipation, diarrhea, and any other symptoms. She denies prior hx of seizures. She quit smoking on 10/17/17.     PREVIOUS RADIATION THERAPY: Yes; 02/24/2012-04/11/2012 Site/dose: Right breast / 45 Gray at 1.8 Gray per fraction x 25 fractions Right breast boost / 16 Gray at [employer] per fraction x 8 fractions NO chemotherapy and she declined anti-estrogen tx, though tumor was 100%ER+/100% PR+    PAST MEDICAL HISTORY:  has a past medical history of Anxiety, Breast cancer ([place]), Depression, H/O mammogram (11/21/12), History of radiation therapy (02/24/12-04/11/12), Palpitations, Taste impairment, and Tobacco dependence (09/05/2013).    PAST SURGICAL HISTORY: Past Surgical History:  Procedure Laterality Date  . APPENDECTOMY  2008   emergency  . BREAST LUMPECTOMY  01/04/12   right breast  . CRANIECTOMY  10/19/2017   at [place]: family history includes Anesthesia problems in her mother; Cancer in her maternal aunt, mother, and paternal grandfather.  SOCIAL HISTORY:  reports that she quit smoking about 4 weeks ago. Her smoking use included cigarettes. She smoked 0.50 packs per day. she has never used smokeless tobacco. She reports that she drinks about 3.0 oz of alcohol per week. She reports  that she does not use drugs.  ALLERGIES: Patient has no known allergies.  MEDICATIONS:  Current Outpatient Medications    Medication Sig Dispense Refill  . acetaminophen (TYLENOL) 325 MG tablet Take by mouth.    . levETIRAcetam (KEPPRA) 500 MG tablet Take 500 mg by mouth.     No current facility-administered medications for this encounter.     REVIEW OF SYSTEMS: A 10+ POINT REVIEW OF SYSTEMS WAS OBTAINED including neurology, dermatology, psychiatry, cardiac, respiratory, lymph, extremities, GI, GU, Musculoskeletal, constitutional, breasts, endocrine, HEENT.  All pertinent positives are noted in the HPI.  All others are negative.    PHYSICAL EXAM:  height is 5\' 3"  (1.6 m) and weight is 158 lb 6.4 oz (71.8 kg). Her temperature is 98.1 F (36.7 C). Her blood pressure is 125/87 and her pulse is 88. Her oxygen saturation is 98%.   General: Alert and oriented, in no acute distress HEENT: Head is normocephalic. Extraocular movements are intact. Oropharynx is clear. 1 cm ulcer on the inner right mandibular alveolar ridge posteriorly. Hearing grossly intact. Right TM is slightly erythematous. Left TM clear.  Scar over her right pariotemporal scalp is healing well. No staples.  Neck: Neck is supple, no palpable cervical or supraclavicular lymphadenopathy. Heart: Regular in rate and rhythm with no murmurs, rubs, or gallops. Chest: Clear to auscultation bilaterally, with no rhonchi, wheezes, or rales. Abdomen: Soft, nontender, nondistended, with no rigidity or guarding. Extremities: No cyanosis or edema. Lymphatics: see Neck Exam Skin: No concerning lesions. Musculoskeletal: symmetric strength and muscle tone throughout. No edema in her wrist bilaterally.  Neurologic: Cranial nerves II through XII are grossly intact. No obvious focalities. Speech is fluent. Coordination is intact.  Finger to nose test intact. Grossly all of her visual quadrants are intact. Although her vision is subjectively blurred, she is able to read all of the words on my nametag.  Object recall 3/3 at 59min Psychiatric: Judgment and insight are intact.  Affect is appropriate.   KPS= 80  100 - Normal; no complaints; no evidence of disease. 90   - Able to carry on normal activity; minor signs or symptoms of disease. 80   - Normal activity with effort; some signs or symptoms of disease. 8   - Cares for self; unable to carry on normal activity or to do active work. 60   - Requires occasional assistance, but is able to care for most of his personal needs. 50   - Requires considerable assistance and frequent medical care. 61   - Disabled; requires special care and assistance. 57   - Severely disabled; hospital admission is indicated although death not imminent. 51   - Very sick; hospital admission necessary; active supportive treatment necessary. 10   - Moribund; fatal processes progressing rapidly. 0     - Dead  Karnofsky DA, Abelmann [place], Craver LS and [place] JH [phone]) The use of the nitrogen mustards in the palliative treatment of carcinoma: with particular reference to bronchogenic carcinoma Cancer 1 634-56    LABORATORY DATA:  Lab Results  Component Value Date   WBC 7.5 09/18/2013   HGB 13.0 09/18/2013   HCT 39.6 09/18/2013   MCV 89.6 09/18/2013   PLT 325 09/18/2013   CMP     Component Value Date/Time   NA 136 09/18/2013 0900   K 4.0 09/18/2013 0900   CL 103 09/18/2013 0900   CO2 26 09/18/2013 0900   GLUCOSE 82 09/18/2013 0900   BUN 11 09/18/2013 0900  CREATININE 0.64 09/18/2013 0900   CALCIUM 9.0 09/18/2013 0900   PROT 6.6 09/18/2013 0900   ALBUMIN 4.1 09/18/2013 0900   AST 13 09/18/2013 0900   ALT 10 09/18/2013 0900   ALKPHOS 68 09/18/2013 0900   BILITOT 0.6 09/18/2013 0900   GFRNONAA >90 05/30/2013 1535   GFRAA >90 05/30/2013 1535         RADIOGRAPHY:  As above    IMPRESSION/PLAN:   Lori Mcmillan is a lovely woman presenting with Grade 2 post-op meningioma.   Today, I talked to the patient about the findings and work-up thus far. We discussed the patient's diagnosis of grade 2 post-op meningioma  and general treatment for this, highlighting the role of radiotherapy in the management. We discussed the available radiation techniques, and focused on the details of logistics and delivery.    I recommend adjuvant radiotherapy to the tumor bed with margin in order to decrease her risk of locoregional recurrence. We discussed that radiation would take approximately 6-6.5 weeks to complete.   We discussed the risks, benefits, and side effects of radiotherapy. Side effects may include but not necessarily be limited to: fatigue, change in taste, headaches, itchy/redness of scalp, Seizure,  temporary and possibly permanent hair loss,  hearing loss, and cataracts, brain injury, cognitive changes. No guarantees of treatment were given. A consent form was signed and placed in the patient's medical record.  The patient was encouraged to ask questions that I answered to the best of my ability.She is enthusiastic about proceeding with treatment. I look forward to participating in her care.  I discussed the need for the opthmalogist follow up appointment with Dr. Sarina Ser to obtain baseline visual acuity prior to treatment. The patient will make a follow up appointment with Dr. Noel Journey and then have Dr. Noel Journey fax over pertinent information.   I will refer the patient to an ENT specialist for baseline audiology test prior to treatment, mouth sore(smoking history), and right eustachian tube dysfunction post craniotomy.  I will refer the patient to Neuro Oncologist, Dr. Mickeal Skinner, for further neurological exam and evaluation prior to treatment with symptom management as needed.   Will schedule the patient for a follow up CT simulation treatment within the next 1 week..   I will present the patient's case at the next Multidisciplinary Tumor Board meeting.     I spent 60 minutes  face to face with the patient and more than 50% of that time was spent in counseling and/or coordination of care.     __________________________________________   Eppie Gibson, MD  This document serves as a record of services personally performed by Eppie Gibson, MD. It was created on her behalf by Steva Colder, a trained medical scribe. The creation of this record is based on the scribe's personal observations and the provider's statements to them. This document has been checked and approved by the attending provider.

## 2017-11-17 ENCOUNTER — Telehealth: Payer: Self-pay | Admitting: *Deleted

## 2017-11-17 NOTE — Telephone Encounter (Signed)
CALLED PATIENT TO INFORM OF ENT APPT. WITH DR. [place] ON 11-22-17- ARRIVALTIME- 12:45 PM , ADDRESS - [address], PHONE - [phone], LVM FOR A RETURN CALL

## 2017-11-18 ENCOUNTER — Telehealth: Payer: Self-pay | Admitting: *Deleted

## 2017-11-18 NOTE — Telephone Encounter (Signed)
Patient is declining appt. with the ENT Dr. due to lack of Insurance, cancelled appt. Per Dr. Isidore Moos

## 2017-11-21 ENCOUNTER — Encounter: Payer: Self-pay | Admitting: Genetic Counselor

## 2017-11-21 ENCOUNTER — Telehealth: Payer: Self-pay | Admitting: Radiation Oncology

## 2017-11-21 ENCOUNTER — Telehealth: Payer: Self-pay | Admitting: Internal Medicine

## 2017-11-21 ENCOUNTER — Inpatient Hospital Stay: Payer: Medicaid Other | Attending: Internal Medicine | Admitting: Internal Medicine

## 2017-11-21 ENCOUNTER — Other Ambulatory Visit: Payer: Self-pay | Admitting: Radiation Oncology

## 2017-11-21 ENCOUNTER — Ambulatory Visit
Admission: RE | Admit: 2017-11-21 | Discharge: 2017-11-21 | Disposition: A | Discharge status: Home / Self Care | Payer: Medicaid Other | Source: Ambulatory Visit | Attending: Radiation Oncology | Admitting: Radiation Oncology

## 2017-11-21 VITALS — BP 158/71 | HR 66 | Temp 98.1°F | Resp 18 | Ht 63.0 in | Wt 161.7 lb

## 2017-11-21 DIAGNOSIS — D420 Neoplasm of uncertain behavior of cerebral meninges: Secondary | ICD-10-CM

## 2017-11-21 DIAGNOSIS — Z510 Encounter for antineoplastic radiation therapy: Secondary | ICD-10-CM | POA: Insufficient documentation

## 2017-11-21 DIAGNOSIS — R200 Anesthesia of skin: Secondary | ICD-10-CM

## 2017-11-21 DIAGNOSIS — Z923 Personal history of irradiation: Secondary | ICD-10-CM

## 2017-11-21 DIAGNOSIS — C50012 Malignant neoplasm of nipple and areola, left female breast: Secondary | ICD-10-CM

## 2017-11-21 DIAGNOSIS — Z853 Personal history of malignant neoplasm of breast: Secondary | ICD-10-CM

## 2017-11-21 DIAGNOSIS — R51 Headache: Secondary | ICD-10-CM

## 2017-11-21 DIAGNOSIS — C50011 Malignant neoplasm of nipple and areola, right female breast: Secondary | ICD-10-CM

## 2017-11-21 DIAGNOSIS — D320 Benign neoplasm of cerebral meninges: Secondary | ICD-10-CM | POA: Diagnosis present

## 2017-11-21 NOTE — Progress Notes (Signed)
[place] [facility] [place], [place] [address] [phone]   New Patient Evaluation  Date of Service: 11/21/17 Patient Name: Lori Mcmillan Patient MRN: [id number] Patient DOB: 06/13/1961 Provider: Ventura Sellers, MD  Identifying Statement:  Lori Mcmillan is a 57 y.o. 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    Referring Provider: Tammi Sou, MD [address] [place], [place] [address]  Oncologic History: 10/06/17: MRI demonstrates large dural based mass, right temporal 10/19/17: Craniotomy, resection at [facility].  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  Lori Mcmillan presented with headaches and visual issues to her eye doctor on 10/06/17, who noted left optic disc edema. She was then referred for MRI which demonstrated very large dural based mass in the right hemisphere, presumed meningioma.  She underwent craniotomy and resection on 10/19/17 by Dr. Shirlean Mylar at [facility]. Pathology showed atypical meningioma (WHO grade II).  Post-operatively she describes some facial numbness on the right, and some "foggy" feeling.  She finished dexamethasone and post-op keppra on 1/15.  Plan for treatment is radiation therapy with Dr. Isidore Moos, who is simming her today.  She has noted history of grade I breast cancer treated with radiation several years ago.  Medications: Current Outpatient Medications on File Prior to Visit  Medication Sig Dispense Refill  . acetaminophen (TYLENOL) 325 MG tablet Take by mouth.    . levETIRAcetam (KEPPRA) 500 MG tablet Take 500 mg by mouth.     No current facility-administered medications on file prior to visit.     Allergies: No Known Allergies Past Medical History:    Past Medical History:  Diagnosis Date  . Anxiety    occ lorazepam  . 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  Marland Kitchen Depression    zoloft in past--?wt gain.  . H/O mammogram 11/21/12   B/L   . History of radiation therapy 02/24/12-04/11/12   right breast/ 45Gy@1 .8Gyx45fx/boost=16Gy@2  [id number].  Latest mammo and u/s 03/2013--normal.  . Palpitations    has taken Metoprolol for palpitations in the past.  . Taste impairment    s/p radiation therapy  . Tobacco dependence 09/05/2013   Past Surgical History:  Past Surgical History:  Procedure Laterality Date  . APPENDECTOMY  2008   emergency  . BREAST LUMPECTOMY  01/04/12   right breast  . CRANIECTOMY  10/19/2017   at [facility]   Social History:  Social History   Socioeconomic History  . Marital status: Single    Spouse name: Not on file  . Number of children: Not on file  . Years of education: Not on file  . Highest education level: Not on file  Social Needs  . Financial resource strain: Not on file  . Food insecurity - worry: Not on file  . Food insecurity - inability: Not on file  . Transportation needs - medical: Not on file  . Transportation needs - non-medical: Not on file  Occupational History  . Not on file  Tobacco Use  . Smoking status: Former Smoker    Packs/day: 0.50    Types: Cigarettes    Last  attempt to quit: 10/17/2017    Years since quitting: 0.0  . Smokeless tobacco: Never Used  Substance and Sexual Activity  . Alcohol use: Yes    Alcohol/week: 3.0 oz    Types: 5 Cans of beer per week  . Drug use: No  . Sexual activity: Not Currently  Other Topics Concern  . Not on file  Social History Narrative   Single, no children.   Has a cat and a dog.   Orig from [place] in [place].   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   Tob: 20 pack-yr hx, current as of 09/05/13.   Alc: beer  1-2 per day avg, whisky with water 1 glass hs.   High protein/low carb diet.   Walks 3 x/week with dog.  Some biking.   Family History:  Family History  Problem Relation Age of Onset  . Anesthesia problems Mother   . Cancer Mother        breast  . Cancer Maternal Aunt        breast  . Cancer Paternal Grandfather        colon    Review of Systems: Constitutional: Denies fevers, chills or abnormal weight loss Eyes: Denies blurriness of vision Ears, nose, mouth, throat, and face: Denies mucositis or sore throat Respiratory: Denies cough, dyspnea or wheezes Cardiovascular: Denies palpitation, chest discomfort or lower extremity swelling Gastrointestinal:  Denies nausea, constipation, diarrhea GU: Denies dysuria or incontinence Skin: Denies abnormal skin rashes Neurological: Per HPI Musculoskeletal: Denies joint pain, back or neck discomfort. No decrease in ROM Behavioral/Psych: Denies anxiety, disturbance in thought content, and mood instability  Physical Exam: Vitals:   11/21/17 1041  BP: (!) 158/71  Pulse: 66  Resp: 18  Temp: 98.1 F (36.7 C)  SpO2: 100%   KPS: 90. General: Alert, cooperative, pleasant, in no acute distress Head: Craniotomy scar noted, dry and intact. EENT: No conjunctival injection or scleral icterus. Oral mucosa moist Lungs: Resp effort normal Cardiac: Regular rate and rhythm Abdomen: Soft, non-distended abdomen Skin: No rashes cyanosis or petechiae. Extremities: No clubbing or edema  Neurologic Exam: Mental Status: Awake, alert, attentive to examiner. Oriented to self and environment. Language is fluent with intact comprehension.  Cranial Nerves: Visual acuity is grossly normal. Visual fields are full. Extra-ocular movements intact. No ptosis. Face is symmetric, tongue midline.  Some sensory loss right>left face Motor: Tone and bulk are normal. Power is full in both arms and legs. Reflexes are symmetric, no pathologic reflexes present. Intact  finger to nose bilaterally Sensory: Intact to light touch and temperature Gait: Normal and tandem gait is normal.   Labs: I have reviewed the data as listed    Component Value Date/Time   NA 136 09/18/2013 0900   K 4.0 09/18/2013 0900   CL 103 09/18/2013 0900   CO2 26 09/18/2013 0900   GLUCOSE 82 09/18/2013 0900   BUN 11 09/18/2013 0900   CREATININE 0.64 09/18/2013 0900   CALCIUM 9.0 09/18/2013 0900   PROT 6.6 09/18/2013 0900   ALBUMIN 4.1 09/18/2013 0900   AST 13 09/18/2013 0900   ALT 10 09/18/2013 0900   ALKPHOS 68 09/18/2013 0900   BILITOT 0.6 09/18/2013 0900   GFRNONAA >90 05/30/2013 1535   GFRAA >90 05/30/2013 1535   Lab Results  Component Value Date   WBC 7.5 09/18/2013   NEUTROABS 4.8 09/18/2013   HGB 13.0 09/18/2013   HCT 39.6 09/18/2013   MCV 89.6 09/18/2013   PLT 325 09/18/2013  Imaging: [place] Clinician Interpretation: I have personally reviewed the outside brain MRI dated 11/10/17.  My interpretation, in the context of the patient's clinical presentation, is post surgical changes  EXAM: Magnetic resonance imaging, brain, without and with contrast material. DATE: 10/21/2017 10:54 PM ACCESSION: [id number] UN DICTATED: 10/21/2017 11:22 PM INTERPRETATION LOCATION: [place]  CLINICAL INDICATION: 57 years old Female with -brain tumor protocol-  COMPARISON: MRI brain dated 10/06/2017  TECHNIQUE: Multiplanar, multisequence MR imaging of the brain was performed without and with I.V. contrast.  FINDINGS:Interval right-sided craniotomy with right temporal meningioma resection. Expected postsurgical findings including blood and partially loculated hematomas in the resection bed, extra-axial gas and fluid containing collection and small volume pneumocephalus. Midline shift measures roughly 0.7 cm to the left, previously 1.1 cm. Decreased left lateral ventricular dilatation as compared to prior.  Small foci of restricted diffusion are present in the left  cerebellum. Minimal rim of ischemia marginating the resection bed.  There is no abnormal enhancement.  Other Result Information  Interface, Rad Results In - 10/22/2017  9:45 AM EST EXAM: Magnetic resonance imaging, brain, without and with contrast material. DATE: 10/21/2017 10:54 PM ACCESSION: [id number] UN DICTATED: 10/21/2017 11:22 PM INTERPRETATION LOCATION: [place]  CLINICAL INDICATION: 57 years old Female with -brain tumor protocol-    COMPARISON: MRI brain dated 10/06/2017  TECHNIQUE: Multiplanar, multisequence MR imaging of the brain was performed without and with I.V. contrast.  FINDINGS:  Interval right-sided craniotomy with right temporal meningioma resection. Expected postsurgical findings including blood and partially loculated hematomas in the resection bed, extra-axial gas and fluid containing collection and small volume pneumocephalus. Midline shift measures roughly 0.7 cm to the left, previously 1.1 cm. Decreased left lateral ventricular dilatation as compared to prior.  Small foci of restricted diffusion are present in the left cerebellum. Minimal rim of ischemia marginating the resection bed.  There is no abnormal enhancement.   IMPRESSION:  -Postsurgical sequela status post right craniotomy for meningioma resection. Improved midline shift measuring 0.7 cm to the left.  - Small acute left cerebellar infarcts.     Pathology:   Assessment/Plan Atypical meningioma of brain [facility])  Ms. Havlik is clinically and radiographically stable following surgery. Today we extensively reviewed her pathology and clinical history.  She does have some minor cranial nerve deficits likely from surgery.  Her visual fields are intact.  Next step will be intensity modulated radiation therapy with Dr. Isidore Moos.     We appreciate the opportunity to participate in the care of APURVA REILY.   She should return to clinic ~2 months following radiation with an MRI for  review.  Screening for potential clinical trials was performed and discussed using eligibility criteria for active protocols at [facility], loco-regional tertiary centers, as well as national database available on directyarddecor.com.    The patient is not a candidate for a research protocol at this time due to no suitable study identified.   We spent twenty additional minutes teaching regarding the natural history, biology, and historical experience in the treatment of brain tumors. We then discussed in detail the current recommendations for therapy focusing on the mode of administration, mechanism of action, anticipated toxicities, and quality of life issues associated with this plan. We also provided teaching sheets for the patient to take home as an additional resource.  All questions were answered. The patient knows to call the clinic with any problems, questions or concerns. No barriers to learning were detected.  The total time spent in the encounter was 60  minutes and more than 50% was on counseling and review of test results   Ventura Sellers, MD Medical Director of Neuro-Oncology [facility] at [place] 11/21/17 10:41 AM

## 2017-11-21 NOTE — Telephone Encounter (Signed)
Scheduled appt per 2/4 sch message - referral from Dr. Isidore Moos - reminder letter sent in the mail.

## 2017-11-21 NOTE — Telephone Encounter (Signed)
Scheduled appt per 2/4 los - Gave patient AVS and calender per los.  

## 2017-11-21 NOTE — Progress Notes (Signed)
Lori Mcmillan [id number] 09/25/1961   SIMULATION AND TREATMENT PLANNING NOTE   OUTPATIENT  DIAGNOSIS:    ICD-10-CM   1. Atypical meningioma of brain ([place]) D42.0     NARRATIVE:  The patient was brought to the [facility].  Identity was confirmed.  All relevant records and images related to the planned course of therapy were reviewed.  The patient freely provided informed written consent to proceed with treatment after reviewing the details related to the planned course of therapy. The consent form was witnessed and verified by the simulation staff.    Then, the patient was set-up in a stable reproducible  supine position for radiation therapy. Aquaplast head mask was made for immobilization.  CT images were obtained.  Surface markings were placed.  The CT images were loaded into the planning software.    TREATMENT PLANNING NOTE: Treatment planning then occurred.  The radiation prescription was entered and confirmed.    A total of 1 medically necessary complex treatment devices were fabricated and supervised by me, in the form of Aquaplast mask.   MULTIPLE FIELDS WITH MLCs MAY DESIGNED IN DOSIMETRY for dose homogeneity.  I have requested : Intensity Modulated Radiotherapy (IMRT) is medically necessary for this case for the following reason:  Critical CNS structure avoidance - brainstem, optic chiasm, optic nerves,  brain.  The patient will receive 55.8 Gy at 1.8Gy per fraction to the tumor bed with margin. -----------------------------------  Eppie Gibson, MD

## 2017-11-23 DIAGNOSIS — Z510 Encounter for antineoplastic radiation therapy: Secondary | ICD-10-CM | POA: Diagnosis not present

## 2017-11-24 ENCOUNTER — Encounter: Payer: Self-pay | Admitting: Family Medicine

## 2017-11-30 ENCOUNTER — Ambulatory Visit
Admission: RE | Admit: 2017-11-30 | Discharge: 2017-11-30 | Disposition: A | Discharge status: Home / Self Care | Payer: Medicaid Other | Source: Ambulatory Visit | Attending: Radiation Oncology | Admitting: Radiation Oncology

## 2017-11-30 DIAGNOSIS — Z510 Encounter for antineoplastic radiation therapy: Secondary | ICD-10-CM | POA: Diagnosis not present

## 2017-12-01 ENCOUNTER — Ambulatory Visit
Admission: RE | Admit: 2017-12-01 | Discharge: 2017-12-01 | Disposition: A | Discharge status: Home / Self Care | Payer: Medicaid Other | Source: Ambulatory Visit | Attending: Radiation Oncology | Admitting: Radiation Oncology

## 2017-12-01 DIAGNOSIS — Z510 Encounter for antineoplastic radiation therapy: Secondary | ICD-10-CM | POA: Diagnosis not present

## 2017-12-01 DIAGNOSIS — D420 Neoplasm of uncertain behavior of cerebral meninges: Secondary | ICD-10-CM

## 2017-12-01 MED ORDER — SONAFINE EX EMUL
1.0000 "application " | Freq: Once | CUTANEOUS | Status: AC
  Administered 2017-12-01: 1 via TOPICAL

## 2017-12-01 NOTE — Progress Notes (Signed)
Pt here for patient teaching.  Pt given Radiation and You booklet, skin care instructions and Sonafine.  Reviewed areas of pertinence such as fatigue, hair loss, nausea and vomiting, skin changes, headache and taste changes . Pt able to give teach back of to pat skin, use unscented/gentle soap and drink plenty of water,apply Sonafine bid, avoid applying anything to skin within 4 hours of treatment and to use an electric razor if they must shave. Pt verbalizes understanding of information given and will contact nursing with any questions or concerns.     Http://rtanswers.org/treatmentinformation/whattoexpect/index

## 2017-12-02 ENCOUNTER — Ambulatory Visit
Admission: RE | Admit: 2017-12-02 | Discharge: 2017-12-02 | Disposition: A | Discharge status: Home / Self Care | Payer: Medicaid Other | Source: Ambulatory Visit | Attending: Radiation Oncology | Admitting: Radiation Oncology

## 2017-12-02 DIAGNOSIS — Z510 Encounter for antineoplastic radiation therapy: Secondary | ICD-10-CM | POA: Diagnosis not present

## 2017-12-05 ENCOUNTER — Ambulatory Visit
Admission: RE | Admit: 2017-12-05 | Discharge: 2017-12-05 | Disposition: A | Discharge status: Home / Self Care | Payer: Medicaid Other | Source: Ambulatory Visit | Attending: Radiation Oncology | Admitting: Radiation Oncology

## 2017-12-05 DIAGNOSIS — Z510 Encounter for antineoplastic radiation therapy: Secondary | ICD-10-CM | POA: Diagnosis not present

## 2017-12-06 ENCOUNTER — Ambulatory Visit
Admission: RE | Admit: 2017-12-06 | Discharge: 2017-12-06 | Disposition: A | Discharge status: Home / Self Care | Payer: Medicaid Other | Source: Ambulatory Visit | Attending: Radiation Oncology | Admitting: Radiation Oncology

## 2017-12-06 DIAGNOSIS — Z510 Encounter for antineoplastic radiation therapy: Secondary | ICD-10-CM | POA: Diagnosis not present

## 2017-12-07 ENCOUNTER — Ambulatory Visit
Admission: RE | Admit: 2017-12-07 | Discharge: 2017-12-07 | Disposition: A | Discharge status: Home / Self Care | Payer: Medicaid Other | Source: Ambulatory Visit | Attending: Radiation Oncology | Admitting: Radiation Oncology

## 2017-12-07 DIAGNOSIS — Z510 Encounter for antineoplastic radiation therapy: Secondary | ICD-10-CM | POA: Diagnosis not present

## 2017-12-08 ENCOUNTER — Ambulatory Visit
Admission: RE | Admit: 2017-12-08 | Discharge: 2017-12-08 | Disposition: A | Discharge status: Home / Self Care | Payer: Medicaid Other | Source: Ambulatory Visit | Attending: Radiation Oncology | Admitting: Radiation Oncology

## 2017-12-08 DIAGNOSIS — Z510 Encounter for antineoplastic radiation therapy: Secondary | ICD-10-CM | POA: Diagnosis not present

## 2017-12-09 ENCOUNTER — Ambulatory Visit
Admission: RE | Admit: 2017-12-09 | Discharge: 2017-12-09 | Disposition: A | Discharge status: Home / Self Care | Payer: Medicaid Other | Source: Ambulatory Visit | Attending: Radiation Oncology | Admitting: Radiation Oncology

## 2017-12-09 DIAGNOSIS — Z510 Encounter for antineoplastic radiation therapy: Secondary | ICD-10-CM | POA: Diagnosis not present

## 2017-12-12 ENCOUNTER — Ambulatory Visit
Admission: RE | Admit: 2017-12-12 | Discharge: 2017-12-12 | Disposition: A | Discharge status: Home / Self Care | Payer: Medicaid Other | Source: Ambulatory Visit | Attending: Radiation Oncology | Admitting: Radiation Oncology

## 2017-12-12 DIAGNOSIS — Z510 Encounter for antineoplastic radiation therapy: Secondary | ICD-10-CM | POA: Diagnosis not present

## 2017-12-13 ENCOUNTER — Ambulatory Visit
Admission: RE | Admit: 2017-12-13 | Discharge: 2017-12-13 | Disposition: A | Discharge status: Home / Self Care | Payer: Medicaid Other | Source: Ambulatory Visit | Attending: Radiation Oncology | Admitting: Radiation Oncology

## 2017-12-13 DIAGNOSIS — Z510 Encounter for antineoplastic radiation therapy: Secondary | ICD-10-CM | POA: Diagnosis not present

## 2017-12-14 ENCOUNTER — Ambulatory Visit
Admission: RE | Admit: 2017-12-14 | Discharge: 2017-12-14 | Disposition: A | Discharge status: Home / Self Care | Payer: Medicaid Other | Source: Ambulatory Visit | Attending: Radiation Oncology | Admitting: Radiation Oncology

## 2017-12-14 DIAGNOSIS — Z510 Encounter for antineoplastic radiation therapy: Secondary | ICD-10-CM | POA: Diagnosis not present

## 2017-12-15 ENCOUNTER — Ambulatory Visit
Admission: RE | Admit: 2017-12-15 | Discharge: 2017-12-15 | Disposition: A | Discharge status: Home / Self Care | Payer: Medicaid Other | Source: Ambulatory Visit | Attending: Radiation Oncology | Admitting: Radiation Oncology

## 2017-12-15 DIAGNOSIS — Z510 Encounter for antineoplastic radiation therapy: Secondary | ICD-10-CM | POA: Diagnosis not present

## 2017-12-16 ENCOUNTER — Ambulatory Visit
Admission: RE | Admit: 2017-12-16 | Discharge: 2017-12-16 | Disposition: A | Discharge status: Home / Self Care | Payer: Medicaid Other | Source: Ambulatory Visit | Attending: Radiation Oncology | Admitting: Radiation Oncology

## 2017-12-16 DIAGNOSIS — D320 Benign neoplasm of cerebral meninges: Secondary | ICD-10-CM | POA: Diagnosis not present

## 2017-12-16 DIAGNOSIS — Z510 Encounter for antineoplastic radiation therapy: Secondary | ICD-10-CM | POA: Diagnosis not present

## 2017-12-19 ENCOUNTER — Ambulatory Visit
Admission: RE | Admit: 2017-12-19 | Discharge: 2017-12-19 | Disposition: A | Discharge status: Home / Self Care | Payer: Medicaid Other | Source: Ambulatory Visit | Attending: Radiation Oncology | Admitting: Radiation Oncology

## 2017-12-19 DIAGNOSIS — Z510 Encounter for antineoplastic radiation therapy: Secondary | ICD-10-CM | POA: Diagnosis not present

## 2017-12-20 ENCOUNTER — Ambulatory Visit
Admission: RE | Admit: 2017-12-20 | Discharge: 2017-12-20 | Disposition: A | Discharge status: Home / Self Care | Payer: Medicaid Other | Source: Ambulatory Visit | Attending: Radiation Oncology | Admitting: Radiation Oncology

## 2017-12-20 DIAGNOSIS — Z510 Encounter for antineoplastic radiation therapy: Secondary | ICD-10-CM | POA: Diagnosis not present

## 2017-12-21 ENCOUNTER — Ambulatory Visit
Admission: RE | Admit: 2017-12-21 | Discharge: 2017-12-21 | Disposition: A | Discharge status: Home / Self Care | Payer: Medicaid Other | Source: Ambulatory Visit | Attending: Radiation Oncology | Admitting: Radiation Oncology

## 2017-12-21 DIAGNOSIS — Z510 Encounter for antineoplastic radiation therapy: Secondary | ICD-10-CM | POA: Diagnosis not present

## 2017-12-22 ENCOUNTER — Ambulatory Visit
Admission: RE | Admit: 2017-12-22 | Discharge: 2017-12-22 | Disposition: A | Discharge status: Home / Self Care | Payer: Medicaid Other | Source: Ambulatory Visit | Attending: Radiation Oncology | Admitting: Radiation Oncology

## 2017-12-22 DIAGNOSIS — Z510 Encounter for antineoplastic radiation therapy: Secondary | ICD-10-CM | POA: Diagnosis not present

## 2017-12-23 ENCOUNTER — Ambulatory Visit
Admission: RE | Admit: 2017-12-23 | Discharge: 2017-12-23 | Disposition: A | Discharge status: Home / Self Care | Payer: Medicaid Other | Source: Ambulatory Visit | Attending: Radiation Oncology | Admitting: Radiation Oncology

## 2017-12-23 DIAGNOSIS — Z510 Encounter for antineoplastic radiation therapy: Secondary | ICD-10-CM | POA: Diagnosis not present

## 2017-12-26 ENCOUNTER — Telehealth: Payer: Self-pay | Admitting: *Deleted

## 2017-12-26 ENCOUNTER — Ambulatory Visit
Admission: RE | Admit: 2017-12-26 | Discharge: 2017-12-26 | Disposition: A | Discharge status: Home / Self Care | Payer: Medicaid Other | Source: Ambulatory Visit | Attending: Radiation Oncology | Admitting: Radiation Oncology

## 2017-12-26 ENCOUNTER — Other Ambulatory Visit: Payer: Self-pay | Admitting: Radiation Oncology

## 2017-12-26 DIAGNOSIS — Z853 Personal history of malignant neoplasm of breast: Secondary | ICD-10-CM

## 2017-12-26 DIAGNOSIS — Z510 Encounter for antineoplastic radiation therapy: Secondary | ICD-10-CM | POA: Diagnosis not present

## 2017-12-26 NOTE — Telephone Encounter (Signed)
CALLED PATIENT TO INFORM OF MAMMOGRAM AND [place] FOR 12-29-17 - ARRIVAL TIME- 1:45 PM @ [facility], LVM FOR A RETURN CALL

## 2017-12-27 ENCOUNTER — Ambulatory Visit
Admission: RE | Admit: 2017-12-27 | Discharge: 2017-12-27 | Disposition: A | Discharge status: Home / Self Care | Payer: Medicaid Other | Source: Ambulatory Visit | Attending: Radiation Oncology | Admitting: Radiation Oncology

## 2017-12-27 DIAGNOSIS — Z510 Encounter for antineoplastic radiation therapy: Secondary | ICD-10-CM | POA: Diagnosis not present

## 2017-12-28 ENCOUNTER — Ambulatory Visit
Admission: RE | Admit: 2017-12-28 | Discharge: 2017-12-28 | Disposition: A | Discharge status: Home / Self Care | Payer: Medicaid Other | Source: Ambulatory Visit | Attending: Radiation Oncology | Admitting: Radiation Oncology

## 2017-12-28 DIAGNOSIS — Z510 Encounter for antineoplastic radiation therapy: Secondary | ICD-10-CM | POA: Diagnosis not present

## 2017-12-29 ENCOUNTER — Ambulatory Visit
Admission: RE | Admit: 2017-12-29 | Discharge: 2017-12-29 | Disposition: A | Discharge status: Home / Self Care | Payer: Medicaid Other | Source: Ambulatory Visit | Attending: Radiation Oncology | Admitting: Radiation Oncology

## 2017-12-29 DIAGNOSIS — Z510 Encounter for antineoplastic radiation therapy: Secondary | ICD-10-CM | POA: Diagnosis not present

## 2017-12-30 ENCOUNTER — Ambulatory Visit
Admission: RE | Admit: 2017-12-30 | Discharge: 2017-12-30 | Disposition: A | Discharge status: Home / Self Care | Payer: Medicaid Other | Source: Ambulatory Visit | Attending: Radiation Oncology | Admitting: Radiation Oncology

## 2017-12-30 DIAGNOSIS — Z510 Encounter for antineoplastic radiation therapy: Secondary | ICD-10-CM | POA: Diagnosis not present

## 2018-01-02 ENCOUNTER — Encounter: Payer: Self-pay | Admitting: Genetics

## 2018-01-02 ENCOUNTER — Ambulatory Visit
Admission: RE | Admit: 2018-01-02 | Discharge: 2018-01-02 | Disposition: A | Discharge status: Home / Self Care | Payer: Medicaid Other | Source: Ambulatory Visit | Attending: Radiation Oncology | Admitting: Radiation Oncology

## 2018-01-02 ENCOUNTER — Other Ambulatory Visit: Payer: Self-pay | Admitting: Radiation Oncology

## 2018-01-02 DIAGNOSIS — D496 Neoplasm of unspecified behavior of brain: Secondary | ICD-10-CM

## 2018-01-02 DIAGNOSIS — R110 Nausea: Secondary | ICD-10-CM

## 2018-01-02 DIAGNOSIS — Z510 Encounter for antineoplastic radiation therapy: Secondary | ICD-10-CM | POA: Diagnosis not present

## 2018-01-02 MED ORDER — PROMETHAZINE HCL 25 MG PO TABS: 25.0000 mg | ORAL_TABLET | Freq: Four times a day (QID) | ORAL | 3 refills | Status: DC | PRN

## 2018-01-02 MED ORDER — ONDANSETRON HCL 8 MG PO TABS: 8.0000 mg | ORAL_TABLET | Freq: Three times a day (TID) | ORAL | 3 refills | Status: DC | PRN

## 2018-01-03 ENCOUNTER — Ambulatory Visit
Admission: RE | Admit: 2018-01-03 | Discharge: 2018-01-03 | Disposition: A | Discharge status: Home / Self Care | Payer: Medicaid Other | Source: Ambulatory Visit | Attending: Radiation Oncology | Admitting: Radiation Oncology

## 2018-01-03 DIAGNOSIS — Z510 Encounter for antineoplastic radiation therapy: Secondary | ICD-10-CM | POA: Diagnosis not present

## 2018-01-04 ENCOUNTER — Ambulatory Visit
Admission: RE | Admit: 2018-01-04 | Discharge: 2018-01-04 | Disposition: A | Discharge status: Home / Self Care | Payer: Medicaid Other | Source: Ambulatory Visit | Attending: Radiation Oncology | Admitting: Radiation Oncology

## 2018-01-04 DIAGNOSIS — Z510 Encounter for antineoplastic radiation therapy: Secondary | ICD-10-CM | POA: Diagnosis not present

## 2018-01-05 ENCOUNTER — Ambulatory Visit
Admission: RE | Admit: 2018-01-05 | Discharge: 2018-01-05 | Disposition: A | Discharge status: Home / Self Care | Payer: Medicaid Other | Source: Ambulatory Visit | Attending: Radiation Oncology | Admitting: Radiation Oncology

## 2018-01-05 DIAGNOSIS — Z510 Encounter for antineoplastic radiation therapy: Secondary | ICD-10-CM | POA: Diagnosis not present

## 2018-01-06 ENCOUNTER — Ambulatory Visit
Admission: RE | Admit: 2018-01-06 | Discharge: 2018-01-06 | Disposition: A | Discharge status: Home / Self Care | Payer: Medicaid Other | Source: Ambulatory Visit | Attending: Radiation Oncology | Admitting: Radiation Oncology

## 2018-01-06 DIAGNOSIS — Z510 Encounter for antineoplastic radiation therapy: Secondary | ICD-10-CM | POA: Diagnosis not present

## 2018-01-09 ENCOUNTER — Ambulatory Visit
Admission: RE | Admit: 2018-01-09 | Discharge: 2018-01-09 | Disposition: A | Discharge status: Home / Self Care | Payer: Medicaid Other | Source: Ambulatory Visit | Attending: Radiation Oncology | Admitting: Radiation Oncology

## 2018-01-09 DIAGNOSIS — D420 Neoplasm of uncertain behavior of cerebral meninges: Secondary | ICD-10-CM

## 2018-01-09 DIAGNOSIS — Z510 Encounter for antineoplastic radiation therapy: Secondary | ICD-10-CM | POA: Diagnosis not present

## 2018-01-09 MED ORDER — SONAFINE EX EMUL
1.0000 "application " | Freq: Two times a day (BID) | CUTANEOUS | Status: DC
  Administered 2018-01-09: 1 via TOPICAL

## 2018-01-10 ENCOUNTER — Ambulatory Visit
Admission: RE | Admit: 2018-01-10 | Discharge: 2018-01-10 | Disposition: A | Discharge status: Home / Self Care | Payer: Medicaid Other | Source: Ambulatory Visit | Attending: Radiation Oncology | Admitting: Radiation Oncology

## 2018-01-10 DIAGNOSIS — Z510 Encounter for antineoplastic radiation therapy: Secondary | ICD-10-CM | POA: Diagnosis not present

## 2018-01-11 ENCOUNTER — Ambulatory Visit
Admission: RE | Admit: 2018-01-11 | Discharge: 2018-01-11 | Disposition: A | Discharge status: Home / Self Care | Payer: Medicaid Other | Source: Ambulatory Visit | Attending: Radiation Oncology | Admitting: Radiation Oncology

## 2018-01-11 DIAGNOSIS — Z510 Encounter for antineoplastic radiation therapy: Secondary | ICD-10-CM | POA: Diagnosis not present

## 2018-01-17 ENCOUNTER — Encounter: Payer: Self-pay | Admitting: Radiation Oncology

## 2018-01-17 NOTE — Progress Notes (Signed)
  Radiation Oncology         (336) [phone] ________________________________  Name: Lori Mcmillan MRN: [id number]  Date: 01/17/2018  DOB: 1961/08/23  End of Treatment Note  Diagnosis:   Atypical Meningioma of right temporal lobe     Indication for treatment:  Curative       Radiation treatment dates:   11/30/17 - 01/11/18  Site/dose:   Right temporal lobe treated to 55.8 Gy with 31 fx of 1.8 Gy  Beams/energy:   IMRT / 6X  Narrative: The patient tolerated radiation treatment relatively well.   The patient endorsed persistent visual deficits, plus some mild nausea and fatigue during treatment but was able to nap often. She endorsed sonafine on her skin throughout treatment.  Hair thinned over scalp.   Plan: The patient has completed radiation treatment. The patient will return to radiation oncology clinic for routine followup in one month. I advised them to call or return sooner if they have any questions or concerns related to their recovery or treatment.  -----------------------------------  Eppie Gibson, MD  This document serves as a record of services personally performed by Eppie Gibson, MD. It was created on his behalf by Linward Natal, a trained medical scribe. The creation of this record is based on the scribe's personal observations and the provider's statements to them. This document has been checked and approved by the attending provider.

## 2018-02-15 ENCOUNTER — Ambulatory Visit: Payer: Self-pay | Admitting: Radiation Oncology

## 2018-02-22 ENCOUNTER — Encounter: Payer: Self-pay | Admitting: Radiation Oncology

## 2018-02-22 NOTE — Progress Notes (Signed)
Ms. Doig presents for follow up of radiation completed 01/11/18 to her Right Temporal Lobe. She reports pain at her incision site. She is taking Tylenol with some relief. She will see Dr. Mickeal Skinner on 02/27/18. She reports alterations in taste and no appetite. She has lost weight since February. She reports a decreased mood. Her right ear has fullness, pressure present which at times affects her balance.  She has left eye edema and blind spots in her left peripheral vision. 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   BP 131/79   Pulse 81   Temp 98.6 F (37 C)   Resp 18   Ht 5\' 3"  (1.6 m)   Wt 143 lb 3.2 oz (65 kg)   SpO2 99% Comment: roomair  BMI 25.37 kg/m   Wt Readings from Last 3 Encounters:  02/24/18 143 lb 3.2 oz (65 kg)  11/21/17 161 lb 11.2 oz (73.3 kg)  11/16/17 158 lb 6.4 oz (71.8 kg)

## 2018-02-24 ENCOUNTER — Telehealth: Payer: Self-pay | Admitting: Internal Medicine

## 2018-02-24 ENCOUNTER — Other Ambulatory Visit: Payer: Self-pay

## 2018-02-24 ENCOUNTER — Encounter: Payer: Self-pay | Admitting: Radiation Oncology

## 2018-02-24 ENCOUNTER — Ambulatory Visit
Admission: RE | Admit: 2018-02-24 | Discharge: 2018-02-24 | Disposition: A | Discharge status: Home / Self Care | Payer: Medicaid Other | Source: Ambulatory Visit | Attending: Radiation Oncology | Admitting: Radiation Oncology

## 2018-02-24 VITALS — BP 131/79 | HR 81 | Temp 98.6°F | Resp 18 | Ht 63.0 in | Wt 143.2 lb

## 2018-02-24 DIAGNOSIS — Z79899 Other long term (current) drug therapy: Secondary | ICD-10-CM | POA: Insufficient documentation

## 2018-02-24 DIAGNOSIS — H9209 Otalgia, unspecified ear: Secondary | ICD-10-CM | POA: Diagnosis not present

## 2018-02-24 DIAGNOSIS — R630 Anorexia: Secondary | ICD-10-CM | POA: Insufficient documentation

## 2018-02-24 DIAGNOSIS — D420 Neoplasm of uncertain behavior of cerebral meninges: Secondary | ICD-10-CM

## 2018-02-24 DIAGNOSIS — R439 Unspecified disturbances of smell and taste: Secondary | ICD-10-CM | POA: Diagnosis not present

## 2018-02-24 DIAGNOSIS — D320 Benign neoplasm of cerebral meninges: Secondary | ICD-10-CM | POA: Diagnosis present

## 2018-02-24 NOTE — Telephone Encounter (Signed)
Patient called to cancel °

## 2018-02-24 NOTE — Progress Notes (Signed)
Radiation Oncology         (336) [phone] ________________________________  Name: Lori Mcmillan MRN: [id number]  Date: 02/24/2018  DOB: August 23, 1961  Follow-Up Visit Note  Outpatient  CC: McGowen, Adrian Blackwater, MD  Clare Gandy, MD  Diagnosis:   Meningioma of right temporal lobe    ICD-10-CM   1. Atypical meningioma of brain ([place]) D42.0    CHIEF COMPLAINT: Here for follow-up and surveillance of temporal lobe meningioma.  Interval Since Last Radiation:  6 weeks  Right temporal lobe treated with 55.8 Gy in 31 fractions of 1.8 Gy on 11/30/17 to 01/11/18.  Narrative:  The patient returns today for routine follow-up.  She reports pain at her incision site. She is taking Tylenol with some relief. She was to see Dr. Mickeal Skinner on 02/27/18. She cancelled this appt, however. She reports alterations in taste and no appetite. She has lost weight since February. She reports a decreased mood. Her right ear has fullness, pressure present which at times affects her balance.  She has  persistent blind spots in her left peripheral vision. 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   ALLERGIES:  has No Known Allergies.  Meds: Current Outpatient Medications  Medication Sig Dispense Refill  . acetaminophen (TYLENOL) 325 MG tablet Take by mouth.    . ondansetron (ZOFRAN) 8 MG tablet Take 1 tablet (8 mg total) by mouth every 8 (eight) hours as needed for nausea or vomiting. (Patient not taking: Reported on 02/24/2018) 30 tablet 3  . promethazine (PHENERGAN) 25 MG tablet Take 1 tablet (25 mg total) by mouth every 6 (six) hours as needed for nausea or vomiting. (Patient not taking: Reported on 02/24/2018) 30 tablet 3   No current facility-administered medications for this encounter.     Physical Findings: The patient is in no acute distress. Patient is alert and oriented.  height is 5\' 3"  (1.6 m) and weight is 143 lb 3.2 oz (65 kg). Her temperature is 98.6 F (37 C). Her blood pressure is 131/79 and  her pulse is 81. Her respiration is 18 and oxygen saturation is 99%. Caleen Essex are clear. Alopecia in the occipital portion of her scalp.  No palpable masses in her neck. No numbness in her arms and legs.  She has blurred vision left peripheral vision. There is some fluid in the right tympanic membrane.  Lab Findings: Lab Results  Component Value Date   WBC 7.5 09/18/2013   HGB 13.0 09/18/2013   HCT 39.6 09/18/2013   MCV 89.6 09/18/2013   PLT 325 09/18/2013    Radiographic Findings: No results found.  Impression/Plan:  Patient expressed not feeling her self and being less decisive. I talked with her about ways she can approach her overall healing process. I recommended her to the [place] program at the [facility] and information about a seminar here [facility]) to help emotionally. I recommended for her to take one prenatal vitamin daily, to potentially help with nutritional supplementation and hair growth.  Patient will followup as scheduled at [facility] for MRI brain imaging and surveillance. I will see her this summer after imaging is done at [facility]. I will make a re-referral for patient to see Dr. Mickeal Skinner of neurooncology. And a referral to see social work and physical therapist for emotional and physical healing.   She would also like to see a ENT but intends to request a referral at [facility]; she will also request a Therapist, nutritional. She made try OTC Pseudoephedrine for  her inner ear until seeing ENT.  I spent 20 minutes face to face with the patient and more than 50% of that time was spent in counseling and/or coordination of care.    _____________________________________   Eppie Gibson, MD  This document serves as a record of services personally performed by Eppie Gibson MD. It was created on her behalf by Delton Coombes, a trained medical scribe. The creation of this record is based on the scribe's personal observations and the provider's statements to them.

## 2018-02-27 ENCOUNTER — Inpatient Hospital Stay: Payer: Medicaid Other | Admitting: Internal Medicine

## 2018-02-28 ENCOUNTER — Other Ambulatory Visit: Payer: Self-pay | Admitting: Radiation Oncology

## 2018-02-28 ENCOUNTER — Encounter: Payer: Self-pay | Admitting: Radiation Oncology

## 2018-02-28 DIAGNOSIS — D420 Neoplasm of uncertain behavior of cerebral meninges: Secondary | ICD-10-CM

## 2018-03-14 ENCOUNTER — Telehealth: Payer: Self-pay | Admitting: Radiation Therapy

## 2018-03-14 NOTE — Telephone Encounter (Signed)
Dr. Isidore Moos asked that I call Ms. Warren to follow-up on the details of their last visit.   ----------------------------------    - [facility] imaging is scheduled for 7/11. I will set her up for a follow-up with both Dr. Isidore Moos and Dr. Mickeal Skinner soon after as requested.    - Ms. Marchese has heard a response from the physical therapy referral placed by Dr. Isidore Moos, but not social work, so I will reach out to [facility] and [employer] in social work about this.   - Ms. Marland confirmed that she wants to see a [facility] ENT and ophthalmologist. Both of these referrals need to be placed by her [facility] MD when she sees him on 7/11, so there is no action required by this office to fulfill this task.   - Ms. Dimperio has also confirmed that she has been using OTC Pseudoephedrine for several months with very little improvement. She understands that the ENT visit will be most beneficial for this issue.    Mont Dutton R.T.(R)(T) Special Procedures Navigator Radiation Oncology

## 2018-03-17 ENCOUNTER — Encounter: Payer: Self-pay | Admitting: *Deleted

## 2018-03-17 NOTE — Progress Notes (Signed)
[place] Work  Clinical Social Work was referred by Pension scheme manager for assessment of psychosocial needs.  Clinical Social Worker contacted patient by phone  to offer support and assess for needs.  CSW left voicemail requesting patient return call.      Kennith Center, LCSW  Clinical Social Worker [facility]

## 2018-03-23 ENCOUNTER — Encounter: Payer: Self-pay | Admitting: Family Medicine

## 2018-04-24 NOTE — Progress Notes (Addendum)
Ms. Lori Mcmillan presents for follow up of radiation completed 01/11/18 to her Right temporal lobe. She will see Dr. Mickeal Skinner today after this appointment with Dr. Isidore Moos. She had a brain MRI at [facility] on 04/27/18. She continues to have vision changes to her Left Eye. She saw an opthamologist. She has a superior deficit in her left eye vision. There is edema present. She reports pain to her incision site. It feels like "someone is trying to pry it open with a hammer". The area is numb and tingling. She does experience headaches as well. She has pressure and pain to her Right ear due to fluid. It does cause her to be off balance at times. At times she has the sensation of "something biting her on any part of her body". She describes it as a stinging/ biting sensation.   MRI Brain 04/27/18 FINDINGS:  There has been interval evolution of postsurgical change related to previous right-sided craniotomy for resection of right temporal meningioma. There is a small amount of gliosis and chronic blood products surrounding the resection cavity. There is postoperative pachymeningeal dural thickening and enhancement adjacent to the right-sided cranioplasty. No abnormal nodular or masslike enhancement is demonstrated at the resection site to suggest residual/recurrent neoplasm. There is a small extra-axial fluid collection adjacent to the cranioplasty which measures up to 10 mm in diameter. There is no midline shift or evidence of hydrocephalus. There is no evidence of acute infarction. There are scattered foci of T2/FLAIR hyperintensity within the bilateral cerebral white matter, nonspecific, although commonly seen in setting of chronic small vessel ischemic disease. There are small chronic infarcts within the inferior left cerebellum. There is a right mastoid effusion.  IMPRESSION: nterval evolution of postoperative change related to prior right temporal lobe meningioma resection. No evidence of residual/recurrent  neoplasm  BP 131/89 (BP Location: Left Arm, Patient Position: Sitting, Cuff Size: Normal)   Pulse 79   Temp 98.4 F (36.9 C)   Resp 18   Ht 5\' 4"  (1.626 m)   Wt 145 lb 3.2 oz (65.9 kg)   SpO2 97%   BMI 24.92 kg/m    Wt Readings from Last 3 Encounters:  05/03/18 145 lb 3.2 oz (65.9 kg)  02/24/18 143 lb 3.2 oz (65 kg)  11/21/17 161 lb 11.2 oz (73.3 kg)

## 2018-04-26 ENCOUNTER — Ambulatory Visit
Admission: RE | Admit: 2018-04-26 | Discharge: 2018-04-26 | Disposition: A | Discharge status: Home / Self Care | Payer: Self-pay | Source: Ambulatory Visit | Attending: Radiation Oncology | Admitting: Radiation Oncology

## 2018-04-26 ENCOUNTER — Other Ambulatory Visit: Payer: Self-pay | Admitting: Radiation Oncology

## 2018-04-26 DIAGNOSIS — D329 Benign neoplasm of meninges, unspecified: Secondary | ICD-10-CM

## 2018-04-28 ENCOUNTER — Other Ambulatory Visit: Payer: Self-pay | Admitting: Radiation Oncology

## 2018-04-28 ENCOUNTER — Ambulatory Visit
Admission: RE | Admit: 2018-04-28 | Discharge: 2018-04-28 | Disposition: A | Discharge status: Home / Self Care | Payer: Self-pay | Source: Ambulatory Visit | Attending: Radiation Oncology | Admitting: Radiation Oncology

## 2018-04-28 DIAGNOSIS — D329 Benign neoplasm of meninges, unspecified: Secondary | ICD-10-CM

## 2018-05-03 ENCOUNTER — Inpatient Hospital Stay: Payer: Medicaid Other | Attending: Internal Medicine | Admitting: Internal Medicine

## 2018-05-03 ENCOUNTER — Encounter: Payer: Self-pay | Admitting: Internal Medicine

## 2018-05-03 ENCOUNTER — Ambulatory Visit
Admission: RE | Admit: 2018-05-03 | Discharge: 2018-05-03 | Disposition: A | Discharge status: Home / Self Care | Payer: Medicaid Other | Source: Ambulatory Visit | Attending: Radiation Oncology | Admitting: Radiation Oncology

## 2018-05-03 ENCOUNTER — Encounter: Payer: Self-pay | Admitting: Radiation Oncology

## 2018-05-03 ENCOUNTER — Other Ambulatory Visit: Payer: Self-pay

## 2018-05-03 ENCOUNTER — Telehealth: Payer: Self-pay

## 2018-05-03 VITALS — BP 136/80 | HR 70 | Temp 97.5°F | Resp 17 | Ht 64.0 in | Wt 144.7 lb

## 2018-05-03 VITALS — BP 131/89 | HR 79 | Temp 98.4°F | Resp 18 | Ht 64.0 in | Wt 145.2 lb

## 2018-05-03 DIAGNOSIS — R51 Headache: Secondary | ICD-10-CM

## 2018-05-03 DIAGNOSIS — Z79899 Other long term (current) drug therapy: Secondary | ICD-10-CM | POA: Insufficient documentation

## 2018-05-03 DIAGNOSIS — H532 Diplopia: Secondary | ICD-10-CM

## 2018-05-03 DIAGNOSIS — D320 Benign neoplasm of cerebral meninges: Secondary | ICD-10-CM | POA: Insufficient documentation

## 2018-05-03 DIAGNOSIS — D420 Neoplasm of uncertain behavior of cerebral meninges: Secondary | ICD-10-CM

## 2018-05-03 DIAGNOSIS — R200 Anesthesia of skin: Secondary | ICD-10-CM | POA: Diagnosis not present

## 2018-05-03 DIAGNOSIS — H9201 Otalgia, right ear: Secondary | ICD-10-CM | POA: Diagnosis not present

## 2018-05-03 DIAGNOSIS — Z923 Personal history of irradiation: Secondary | ICD-10-CM

## 2018-05-03 DIAGNOSIS — Z853 Personal history of malignant neoplasm of breast: Secondary | ICD-10-CM | POA: Diagnosis not present

## 2018-05-03 NOTE — Progress Notes (Signed)
Radiation Oncology         [phone] ________________________________  Name: ZAIA CARRE MRN: [id number]  Date: 05/03/2018  DOB: 17-Jun-1961  Follow-Up Visit Note  Outpatient  CC: McGowen, Adrian Blackwater, MD  Clare Gandy, MD  Diagnosis:   Meningioma of right temporal lobe    ICD-10-CM   1. 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  Interval Since Last Radiation:  4 months  Right temporal lobe treated with 55.8 Gy in 31 fractions of 1.8 Gy on 11/30/17 to 01/11/18.  Narrative:  The patient returns today for routine follow-up. She is accompanied by a "very dear" friend. She reports that she only feels good about 2 days a week. She continues to experience issues with vision in her left eye. She experiences pressure and pain to her right ear caused by fluid related to the surgery, which can cause her to be unbalanced. She notes that Dr. Talbot Grumbling told her an ENT may not provide much help, and she should continue to wait it out. He has ordered an MRI in another year. She reports pain, numbness, and tingling to the incision site, fatigue, headaches, and stinging/biting sensations.  MRI brain with and without contrast on 04/27/18 shows interval evolution of postoperative change related to prior right temporal lobe meningioma resection. No evidence of residual/recurrent neoplasm. IMAGES Reviewed by me and the CNS board this week.  ALLERGIES:  has No Known Allergies.  Meds: Current Outpatient Medications  Medication Sig Dispense Refill  . acetaminophen (TYLENOL) 325 MG tablet Take by mouth.    . ondansetron (ZOFRAN) 8 MG tablet Take 1 tablet (8 mg total) by mouth every 8 (eight) hours as needed for nausea or vomiting. (Patient not taking: Reported on 02/24/2018) 30 tablet 3  . promethazine (PHENERGAN) 25 MG tablet Take 1 tablet (25 mg total) by mouth every 6 (six) hours as needed for nausea or  vomiting. (Patient not taking: Reported on 02/24/2018) 30 tablet 3   No current facility-administered medications for this encounter.     Physical Findings:  Her height is 5\' 4"  (1.626 m) and weight is 145 lb 3.2 oz (65.9 kg). Her temperature is 98.4 F (36.9 C). Her blood pressure is 131/89 and her pulse is 79. Her respiration is 18 and oxygen saturation is 97%.   The patient is in no acute distress. Patient is alert and oriented.  Ambulatory. Resolving alopecia on the right side of her scalp with residual alopecia along the surgical scar. Further exam pending appointment with neurooncologist today - she is running late for that.  Lab Findings: Lab Results  Component Value Date   WBC 7.5 09/18/2013   HGB 13.0 09/18/2013   HCT 39.6 09/18/2013   MCV 89.6 09/18/2013   PLT 325 09/18/2013    Radiographic Findings: No results found.  Impression/Plan: Atypical meningioma of brain - NED  I personally reviewed her imaging with the CNS tumor board. Consensus is that she has no evidence of disease but warrants continued follow up. She has an appointment today with neuro-oncology, Dr. Mickeal Skinner.  I'll see her back PRN, and she will follow with Dr. Mickeal Skinner for symptom managment and surveillance. She is pleased with this plan.  I spent 15 minutes face to face with the patient and more than 50% of that time was spent in counseling and/or coordination of care.  _____________________________________   Eppie Gibson, MD  This document  serves as a record of services personally performed by Eppie Gibson, MD. It was created on his behalf by Wilburn Mylar, a trained medical scribe. The creation of this record is based on the scribe's personal observations and the provider's statements to them. This document has been checked and approved by the attending provider.

## 2018-05-03 NOTE — Telephone Encounter (Signed)
Printed avs and calender of upcoming appointment. Per 7/17 los PATIENT REQUESTED [facility]

## 2018-05-03 NOTE — Progress Notes (Signed)
[facility] [facility] [place], [place] [address] [phone]   Interval Evaluation  Date of Service: 05/03/18 Patient Name: Lori Mcmillan Patient MRN: [id number] Patient DOB: 12/11/1960 Provider: Ventura Sellers, MD  Identifying Statement:  Lori Mcmillan is a 57 y.o. female with right temporal meningioma WHO grade II   Oncologic History: 10/06/17: MRI demonstrates large dural based mass, right temporal 10/19/17: Craniotomy, resection at [facility].  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 Isidore Moos  Interval History:  Lori Mcmillan presents today for follow up after completing radiation at the end of March.  She has MRI brain for review today.  She describes no new neurologic deficits.  Does have frequent moderate headaches, has been taking Tylenol daily for the past 3 months.  Continues to have double vision which limits her ability to drive, read, perform some ADLs.  She has noted history of grade I breast cancer treated with radiation several years ago.  Medications: Current Outpatient Medications on File Prior to Visit  Medication Sig Dispense Refill  . acetaminophen (TYLENOL) 325 MG tablet Take by mouth.    . ondansetron (ZOFRAN) 8 MG tablet Take 1 tablet (8 mg total) by mouth every 8 (eight) hours as needed for nausea or vomiting. (Patient not taking: Reported on 02/24/2018) 30 tablet 3  . promethazine (PHENERGAN) 25 MG tablet Take 1 tablet (25 mg total) by mouth every 6 (six) hours as needed for nausea or vomiting. (Patient not taking: Reported on 02/24/2018) 30 tablet 3   No current facility-administered medications on file prior to visit.     Allergies: No Known Allergies Past Medical History:  Past Medical History:  Diagnosis Date  . Anxiety    occ lorazepam  . 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  Marland Kitchen Depression    zoloft in past--?wt gain.  . H/O mammogram 11/21/12   B/L   . History of radiation therapy 02/24/12-04/11/12   right breast/ 45Gy@1 .8Gyx81fx/boost=16Gy@2  Gya40fx.  Latest mammo and u/s 03/2013--normal.  . History of radiation therapy 11/30/17- 01/11/18   Right temporal lobe treated to 55.8 Gy with 31 fx of 1.8 Gy  . Palpitations    has taken Metoprolol for palpitations in the past.  . Taste impairment    s/p radiation therapy  . Tobacco dependence 09/05/2013   Past Surgical History:  Past Surgical History:  Procedure Laterality Date  . APPENDECTOMY  2008   emergency  . BRAIN TUMOR EXCISION  2018   Meningioma  . BREAST LUMPECTOMY  01/04/12   right breast  . CRANIECTOMY  10/19/2017   at [place]   Social History:  Social History   Socioeconomic History  . Marital status: Single    Spouse name: Not on file  . Number of children: Not on file  . Years of education: Not on file  . Highest education level: Not on file  Occupational History  . Not on file  Social Needs  . Financial resource strain: Not on file  . Food insecurity:    Worry: Not on file    Inability: Not on file  . Transportation needs:    Medical: Not on file    Non-medical: Not  on file  Tobacco Use  . Smoking status: Former Smoker    Packs/day: 0.50    Types: Cigarettes    Last attempt to quit: 10/17/2017    Years since quitting: 0.5  . Smokeless tobacco: Never Used  Substance and Sexual Activity  . Alcohol use: Yes    Alcohol/week: 3.0 oz    Types: 5 Cans of beer per week  . Drug use: No  . Sexual activity: Not Currently  Lifestyle  . Physical activity:    Days per week: Not on file    Minutes per session: Not on file  . Stress: Not on file  Relationships  . Social connections:    Talks on phone: Not on file    Gets together: Not on file     Attends religious service: Not on file    Active member of club or organization: Not on file    Attends meetings of clubs or organizations: Not on file    Relationship status: Not on file  . Intimate partner violence:    Fear of current or ex partner: Not on file    Emotionally abused: Not on file    Physically abused: Not on file    Forced sexual activity: Not on file  Other Topics Concern  . Not on file  Social History Narrative   Single, no children.   Has a cat and a dog.   Orig from [place] in [place].   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   Tob: 20 pack-yr hx, current as of 09/05/13.   Alc: beer 1-2 per day avg, whisky with water 1 glass hs.   High protein/low carb diet.   Walks 3 x/week with dog.  Some biking.   Family History:  Family History  Problem Relation Age of Onset  . Anesthesia problems Mother   . Cancer Mother        breast  . Cancer Maternal Aunt        breast  . Cancer Paternal Grandfather        colon    Review of Systems: Constitutional: Denies fevers, chills or abnormal weight loss Eyes: Double vision Ears, nose, mouth, throat, and face: Denies mucositis or sore throat Respiratory: Denies cough, dyspnea or wheezes Cardiovascular: Denies palpitation, chest discomfort or lower extremity swelling Gastrointestinal:  Denies nausea, constipation, diarrhea GU: Denies dysuria or incontinence Skin: Denies abnormal skin rashes Neurological: Per HPI Musculoskeletal: Denies joint pain, back or neck discomfort. No decrease in ROM Behavioral/Psych: Denies anxiety, disturbance in thought content, and mood instability  Physical Exam: Vitals:   05/03/18 1156  BP: 136/80  Pulse: 70  Resp: 17  Temp: (!) 97.5 F (36.4 C)  SpO2: 100%   KPS: 90. General: Alert, cooperative, pleasant, in no acute distress Head: Craniotomy scar noted, dry and intact. EENT: No conjunctival injection or scleral icterus. Oral mucosa moist Lungs: Resp effort normal Cardiac:  Regular rate and rhythm Abdomen: Soft, non-distended abdomen Skin: No rashes cyanosis or petechiae. Extremities: No clubbing or edema  Neurologic Exam: Mental Status: Awake, alert, attentive to examiner. Oriented to self and environment. Language is fluent with intact comprehension. Age advanced pyschomotor slowing with 2/3 object recall at 5 minutes. Cranial Nerves: Visual acuity is grossly normal. Visual fields are full. Extra-ocular movements intact with horizontal diplopia on left gaze. No ptosis. Face is symmetric, tongue midline.  Some sensory loss right>left face Motor: Tone and bulk are normal. Power is full in both arms and legs. Reflexes  are symmetric, no pathologic reflexes present. Intact finger to nose bilaterally Sensory: Intact to light touch and temperature Gait: Normal and tandem gait is normal.   Labs: I have reviewed the data as listed    Component Value Date/Time   NA 136 09/18/2013 0900   K 4.0 09/18/2013 0900   CL 103 09/18/2013 0900   CO2 26 09/18/2013 0900   GLUCOSE 82 09/18/2013 0900   BUN 11 09/18/2013 0900   CREATININE 0.64 09/18/2013 0900   CALCIUM 9.0 09/18/2013 0900   PROT 6.6 09/18/2013 0900   ALBUMIN 4.1 09/18/2013 0900   AST 13 09/18/2013 0900   ALT 10 09/18/2013 0900   ALKPHOS 68 09/18/2013 0900   BILITOT 0.6 09/18/2013 0900   GFRNONAA >90 05/30/2013 1535   GFRAA >90 05/30/2013 1535   Lab Results  Component Value Date   WBC 7.5 09/18/2013   NEUTROABS 4.8 09/18/2013   HGB 13.0 09/18/2013   HCT 39.6 09/18/2013   MCV 89.6 09/18/2013   PLT 325 09/18/2013    Imaging:  [place] Clinician Interpretation: I have personally reviewed the external CNS images obtained VIA powershare from [facility].  My interpretation, in the context of the patient's clinical presentation, is stable disease   Assessment/Plan Atypical meningioma of brain [facility])   Ms. Drost is clinically and radiographically stable following radiation.  Her neurologic deficits include  sensory impairment, diplopia, and mild short term memory deficits.  For headaches, we recommended dose limiting analgesia.  Offered short term decadron or solumedrol for help with decreasing analgesia.  Additionally may benefit from preventative headache medication such as amytryptilline if headache syndrome persists.  We appreciate the opportunity to participate in the care of Lori Mcmillan.   She should return to clinic in 6 months with an MRI for review.  All questions were answered. The patient knows to call the clinic with any problems, questions or concerns. No barriers to learning were detected.  The total time spent in the encounter was 25 minutes and more than 50% was on counseling and review of test results   Ventura Sellers, MD Medical Director of Neuro-Oncology [facility] 05/03/18 11:49 AM

## 2018-05-05 ENCOUNTER — Encounter: Payer: Self-pay | Admitting: Radiation Oncology

## 2018-05-08 ENCOUNTER — Encounter: Payer: Self-pay | Admitting: *Deleted

## 2018-05-08 NOTE — Progress Notes (Signed)
Patient is due to have MRI 6 months from 05/03/2018.  Patient requests to continue to get her scans completed @ [facility] and we will need to have them imported in.  No MRI ordered at this time.

## 2018-05-30 ENCOUNTER — Encounter: Payer: Self-pay | Admitting: Family Medicine

## 2018-07-10 NOTE — Progress Notes (Signed)
FMLA successfully faxed to [facility] Medicaid at [phone]. Mailed copy to patient address on file.

## 2018-08-10 ENCOUNTER — Telehealth: Payer: Self-pay | Admitting: *Deleted

## 2018-08-10 ENCOUNTER — Telehealth: Payer: Self-pay | Admitting: Radiation Therapy

## 2018-08-10 ENCOUNTER — Encounter: Payer: Self-pay | Admitting: Internal Medicine

## 2018-08-10 NOTE — Progress Notes (Signed)
Spoke to [employer] at [employer] office at [phone]. She is requesting office visit notes from last visit on 05/03/18. Successfully faxed records to [phone].

## 2018-08-10 NOTE — Telephone Encounter (Signed)
Patient called requesting letter from Dr Mickeal Skinner to be sent to Disability Determination Services supporting her disability claim.  Fax to [phone] Attn: Case Manager Melania Kirks Robert.    Patient would appreciate a returned call once completed and if we are unable to complete request.

## 2018-08-10 NOTE — Telephone Encounter (Signed)
Ms. Hornik called stating that a Smith Robert from [place] claims, said to her that they have tried multiple times to contact the cancer center in reference to Kateryna's claim and have not gotten a reply. Ms. Trotti is very concerned about this and has asked that we follow-up on this ASAP since she cannot afford to lose her disability.   Smith Robert- [phone] OR [phone] ext [phone]   This message was sent to Ivin Poot, who works specifically with disability claims.     Mont Dutton R.T.(R)(T) Special Procedures Navigator

## 2018-08-14 ENCOUNTER — Telehealth: Payer: Self-pay | Admitting: *Deleted

## 2018-08-14 NOTE — Telephone Encounter (Signed)
Failed attempts to fax to Disability Determination # provided.  Corrected number [phone] included case # [id number]

## 2018-09-04 ENCOUNTER — Telehealth: Payer: Self-pay | Admitting: *Deleted

## 2018-09-04 NOTE — Telephone Encounter (Signed)
Spoke with patient over phone.  She advised she is having issues with Medicaid and her insurance will expire come Nov 27,2019.  She reports having severe headaches and odd pulling sensations on her scalp.  Got patient scheduled to follow up with Dr Mickeal Skinner before her insurance expires to discuss options for HA management and further mgmt if insurance does not get renewed.

## 2018-09-04 NOTE — Telephone Encounter (Signed)
Spoke with patients sister and confirmed that patient is doing ok.  She will relay message to patient to have her call us back if she should need anything regarding her afterhours phone call.

## 2018-09-04 NOTE — Telephone Encounter (Signed)
Received [facility] [employer] telephone call report from 08/30/2018 where patient had called in to report she needed appointment with Dr Mickeal Skinner ASAP.  Reports having severe head pain.  Dizziness, double vision and nausea.    3 attempts to call the patient back made by oncall nurse Alger Simons RN with no answer and message left on 08/30/2018 @ 6:08 pm, 6:15 pm, 6:33 pm   On 09/01/2018 message left by nurse from [facility].  I again called today to reach the patient.  No Answer, Machine message left.

## 2018-09-08 ENCOUNTER — Encounter: Payer: Self-pay | Admitting: Internal Medicine

## 2018-09-08 ENCOUNTER — Inpatient Hospital Stay: Payer: Medicaid Other | Attending: Internal Medicine | Admitting: Internal Medicine

## 2018-09-08 DIAGNOSIS — C712 Malignant neoplasm of temporal lobe: Secondary | ICD-10-CM | POA: Insufficient documentation

## 2018-09-08 DIAGNOSIS — R51 Headache: Secondary | ICD-10-CM | POA: Diagnosis not present

## 2018-09-08 DIAGNOSIS — H532 Diplopia: Secondary | ICD-10-CM | POA: Diagnosis not present

## 2018-09-08 DIAGNOSIS — R519 Headache, unspecified: Secondary | ICD-10-CM | POA: Insufficient documentation

## 2018-09-08 MED ORDER — AMITRIPTYLINE HCL 50 MG PO TABS: 50.0000 mg | ORAL_TABLET | Freq: Every day | ORAL | 3 refills | Status: DC

## 2018-09-08 NOTE — Progress Notes (Signed)
[facility] at [place], [place] [address] [phone]   Interval Evaluation  Date of Service: 09/08/18 Patient Name: Lori Mcmillan Patient MRN: [id number] Patient DOB: 1961-09-06 Provider: Ventura Sellers, MD  Identifying Statement:  Lori Mcmillan is a 57 y.o. 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 Isidore Moos  Interval History:  Lori Mcmillan presents today for follow up.  She describes recent worsening of headache frequency.  She describes unliateral throbbing pain with photophobia, phonophobia, and nausea.  There is no aura or warning.  Headaches last several hours and occur near daily.  She acknowledges very poor sleep with frequent awakenings.  Double vision is persistent and chronic, not improved but also not worsening. Not frequently using pain medicine at this time.    Prior (05/03/18): Completed radiation at the end of March.  She has MRI brain for review today.  She describes no new neurologic deficits.  Does have frequent moderate headaches, has been taking Tylenol daily for the past 3 months.  Continues to have double vision which limits her ability to drive, read, perform some ADLs.  She has noted history of grade I breast cancer treated with radiation several years ago.  Medications: Current Outpatient Medications on File Prior to Visit  Medication Sig Dispense Refill  . acetaminophen (TYLENOL) 325 MG tablet Take by mouth.    . ondansetron (ZOFRAN) 8 MG tablet Take 1 tablet (8 mg total) by mouth every 8 (eight) hours as needed for nausea or vomiting. (Patient not taking: Reported on 02/24/2018) 30 tablet 3  . promethazine (PHENERGAN) 25 MG tablet Take 1 tablet (25 mg total) by mouth every 6 (six) hours  as needed for nausea or vomiting. (Patient not taking: Reported on 02/24/2018) 30 tablet 3   No current facility-administered medications on file prior to visit.     Allergies: No Known Allergies Past Medical History:  Past Medical History:  Diagnosis Date  . Anxiety    occ lorazepam  . Atypical meningioma of brain ([place]) 2018/2019   Resected, then RT Feb/Mar 2019.  No sign of residual dz at 04/2018 rad onc f/u.  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  Marland Kitchen Depression    zoloft in past--?wt gain.  . H/O mammogram 11/21/12   B/L   . History of radiation therapy 02/24/12-04/11/12   right breast/ 45Gy@1 .8Gyx88fx/boost=16Gy@2  Gya45fx.  Latest mammo and u/s 03/2013--normal.  . History of radiation therapy 11/30/17- 01/11/18   Right temporal lobe treated to 55.8 Gy with 31 fx of 1.8 Gy  . Palpitations    has taken Metoprolol for palpitations in the past.  . Taste impairment    s/p radiation therapy  . Tobacco dependence 09/05/2013   Past Surgical History:  Past Surgical History:  Procedure Laterality Date  . APPENDECTOMY  2008   emergency  . BRAIN TUMOR EXCISION  2018   Meningioma  . BREAST LUMPECTOMY  01/04/12   right breast  . CRANIECTOMY  10/19/2017   at [place]   Social History:  Social History   Socioeconomic History  . Marital status: Single    Spouse name:  Not on file  . Number of children: Not on file  . Years of education: Not on file  . Highest education level: Not on file  Occupational History  . Not on file  Social Needs  . Financial resource strain: Not on file  . Food insecurity:    Worry: Not on file    Inability: Not on file  . Transportation needs:    Medical: Not on file    Non-medical: Not on file  Tobacco Use  . Smoking status: Former Smoker    Packs/day: 0.50    Types: Cigarettes    Last attempt to quit:  10/17/2017    Years since quitting: 0.8  . Smokeless tobacco: Never Used  Substance and Sexual Activity  . Alcohol use: Yes    Alcohol/week: 5.0 standard drinks    Types: 5 Cans of beer per week  . Drug use: No  . Sexual activity: Not Currently  Lifestyle  . Physical activity:    Days per week: Not on file    Minutes per session: Not on file  . Stress: Not on file  Relationships  . Social connections:    Talks on phone: Not on file    Gets together: Not on file    Attends religious service: Not on file    Active member of club or organization: Not on file    Attends meetings of clubs or organizations: Not on file    Relationship status: Not on file  . Intimate partner violence:    Fear of current or ex partner: Not on file    Emotionally abused: Not on file    Physically abused: Not on file    Forced sexual activity: Not on file  Other Topics Concern  . Not on file  Social History Narrative   Single, no children.   Has a cat and a dog.   Orig from [place] in [place].   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   Tob: 20 pack-yr hx, current as of 09/05/13.   Alc: beer 1-2 per day avg, whisky with water 1 glass hs.   High protein/low carb diet.   Walks 3 x/week with dog.  Some biking.   Family History:  Family History  Problem Relation Age of Onset  . Anesthesia problems Mother   . Cancer Mother        breast  . Cancer Maternal Aunt        breast  . Cancer Paternal Grandfather        colon    Review of Systems: Constitutional: Denies fevers, chills or abnormal weight loss Eyes: Double vision Ears, nose, mouth, throat, and face: Denies mucositis or sore throat Respiratory: Denies cough, dyspnea or wheezes Cardiovascular: Denies palpitation, chest discomfort or lower extremity swelling Gastrointestinal:  Denies nausea, constipation, diarrhea GU: Denies dysuria or incontinence Skin: Denies abnormal skin rashes Neurological: Per HPI Musculoskeletal: Denies joint pain,  back or neck discomfort. No decrease in ROM Behavioral/Psych: +anxiety  Physical Exam: Vitals:   09/08/18 0902  BP: 108/72  Pulse: 69  Resp: 18  Temp: 97.7 F (36.5 C)  SpO2: 99%   KPS: 90. General: Alert, cooperative, pleasant, in no acute distress Head: Craniotomy scar noted, dry and intact. EENT: No conjunctival injection or scleral icterus. Oral mucosa moist Lungs: Resp effort normal Cardiac: Regular rate and rhythm Abdomen: Soft, non-distended abdomen Skin: No rashes cyanosis or petechiae. Extremities: No clubbing or edema  Neurologic Exam: Mental Status: Awake, alert, attentive  to examiner. Oriented to self and environment. Language is fluent with intact comprehension. Age advanced pyschomotor slowing with 2/3 object recall at 5 minutes. Cranial Nerves: Visual acuity is grossly normal. Visual fields are full. Extra-ocular movements intact with horizontal diplopia on left gaze. No ptosis. Face is symmetric, tongue midline.  Some sensory loss right>left face Motor: Tone and bulk are normal. Power is full in both arms and legs. Reflexes are symmetric, no pathologic reflexes present. Intact finger to nose bilaterally Sensory: Intact to light touch and temperature Gait: Normal and tandem gait is normal.   Labs: I have reviewed the data as listed    Component Value Date/Time   NA 136 09/18/2013 0900   K 4.0 09/18/2013 0900   CL 103 09/18/2013 0900   CO2 26 09/18/2013 0900   GLUCOSE 82 09/18/2013 0900   BUN 11 09/18/2013 0900   CREATININE 0.64 09/18/2013 0900   CALCIUM 9.0 09/18/2013 0900   PROT 6.6 09/18/2013 0900   ALBUMIN 4.1 09/18/2013 0900   AST 13 09/18/2013 0900   ALT 10 09/18/2013 0900   ALKPHOS 68 09/18/2013 0900   BILITOT 0.6 09/18/2013 0900   GFRNONAA >90 05/30/2013 1535   GFRAA >90 05/30/2013 1535   Lab Results  Component Value Date   WBC 7.5 09/18/2013   NEUTROABS 4.8 09/18/2013   HGB 13.0 09/18/2013   HCT 39.6 09/18/2013   MCV 89.6 09/18/2013    PLT 325 09/18/2013      Assessment/Plan Chronic daily headache  Diplopia Meningioma WHO grade II   Lori Mcmillan presents with headache syndrome that has evolved into fulminant migraines.  Poor sleep and chronic eye strain from diplopia are likely underlying exacerbating factors.    Recommending initiating preventive therapy with Elavil 50mg  HS.  Discussed side effects of dry mouth, constipation, fatigue/sleepiness.    For breakthrough headaches recommended naprosyn 500mg  PRN; she understands to take analgesia early in the headache and to avoid daily use.    Also recommended eye patch use to decrease eye strain, and referral to neuro-ophthalmology in [place] when insurance coverage is re-obtained.  We appreciate the opportunity to participate in the care of Lori Mcmillan.   She should return to clinic as scheduled with an MRI for review.  All questions were answered. The patient knows to call the clinic with any problems, questions or concerns. No barriers to learning were detected.  The total time spent in the encounter was 25 minutes and more than 50% was on counseling and review of test results   Ventura Sellers, MD Medical Director of Neuro-Oncology [facility] at [place] 09/08/18 8:59 AM

## 2018-09-11 ENCOUNTER — Telehealth: Payer: Self-pay

## 2018-09-11 NOTE — Telephone Encounter (Signed)
Per 11/22 no los °

## 2018-10-06 ENCOUNTER — Telehealth: Payer: Self-pay

## 2018-10-06 ENCOUNTER — Other Ambulatory Visit: Payer: Self-pay | Admitting: Internal Medicine

## 2018-10-06 DIAGNOSIS — D420 Neoplasm of uncertain behavior of cerebral meninges: Secondary | ICD-10-CM

## 2018-10-06 NOTE — Telephone Encounter (Signed)
Late entry TC from Pt. To inform Dr. Mickeal Skinner that her insurance was reinstated and she can be scheduled for MRI before her January appointment. Dr. Mickeal Skinner informed. MRI referral ordered.

## 2018-10-26 ENCOUNTER — Telehealth: Payer: Self-pay | Admitting: Internal Medicine

## 2018-10-26 NOTE — Telephone Encounter (Signed)
ZV PAL 1/15 - moved f/u to 1/16. Spoke with patient.

## 2018-10-30 ENCOUNTER — Ambulatory Visit
Admission: RE | Admit: 2018-10-30 | Discharge: 2018-10-30 | Disposition: A | Discharge status: Home / Self Care | Payer: Medicaid Other | Source: Ambulatory Visit | Attending: Internal Medicine | Admitting: Internal Medicine

## 2018-10-30 DIAGNOSIS — D420 Neoplasm of uncertain behavior of cerebral meninges: Secondary | ICD-10-CM

## 2018-10-30 MED ORDER — GADOBENATE DIMEGLUMINE 529 MG/ML IV SOLN
13.0000 mL | Freq: Once | INTRAVENOUS | Status: AC | PRN
  Administered 2018-10-30: 13 mL via INTRAVENOUS

## 2018-11-01 ENCOUNTER — Ambulatory Visit: Payer: Medicaid Other | Admitting: Internal Medicine

## 2018-11-02 ENCOUNTER — Inpatient Hospital Stay: Payer: Medicaid Other | Attending: Internal Medicine | Admitting: Internal Medicine

## 2018-11-02 ENCOUNTER — Encounter: Payer: Self-pay | Admitting: Internal Medicine

## 2018-11-02 ENCOUNTER — Telehealth: Payer: Self-pay

## 2018-11-02 VITALS — BP 133/83 | HR 71 | Temp 98.4°F | Resp 18 | Ht 64.0 in | Wt 149.2 lb

## 2018-11-02 DIAGNOSIS — F329 Major depressive disorder, single episode, unspecified: Secondary | ICD-10-CM | POA: Diagnosis not present

## 2018-11-02 DIAGNOSIS — Z923 Personal history of irradiation: Secondary | ICD-10-CM

## 2018-11-02 DIAGNOSIS — D320 Benign neoplasm of cerebral meninges: Secondary | ICD-10-CM | POA: Insufficient documentation

## 2018-11-02 DIAGNOSIS — F419 Anxiety disorder, unspecified: Secondary | ICD-10-CM | POA: Diagnosis not present

## 2018-11-02 DIAGNOSIS — H532 Diplopia: Secondary | ICD-10-CM | POA: Diagnosis not present

## 2018-11-02 DIAGNOSIS — Z79899 Other long term (current) drug therapy: Secondary | ICD-10-CM

## 2018-11-02 DIAGNOSIS — D420 Neoplasm of uncertain behavior of cerebral meninges: Secondary | ICD-10-CM

## 2018-11-02 DIAGNOSIS — Z87891 Personal history of nicotine dependence: Secondary | ICD-10-CM

## 2018-11-02 NOTE — Telephone Encounter (Signed)
Printed avs and calender of upcoming per 1/16 los

## 2018-11-02 NOTE — Progress Notes (Signed)
[facility] at [place], Lori Mcmillan [phone]   Interval Evaluation  Date of Service: 11/02/18 Lori Mcmillan Lori MRN: [id number] Lori DOB: Apr 06, 1961 Provider: Ventura Sellers, MD  Identifying Statement:  Lori Mcmillan is a 58 y.o. 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 Isidore Moos  Interval History:  Lori Mcmillan presents today for follow up.  She describes significant improvement in headaches.  Double vision is persistent and chronic, not improved but also not worsening. Not frequently using pain medicine at this time.    Prior (05/03/18): Completed radiation at the end of March.  She has MRI brain for review today.  She describes no new neurologic deficits.  Does have frequent moderate headaches, has been taking Tylenol daily for the past 3 months.  Continues to have double vision which limits her ability to drive, read, perform some ADLs.  She has noted history of grade I breast cancer treated with radiation several years ago.  Medications: Current Outpatient Medications on File Prior to Visit  Medication Sig Dispense Refill  . acetaminophen (TYLENOL) 325 MG tablet Take by mouth.    Marland Kitchen amitriptyline (ELAVIL) 50 MG tablet Take 1 tablet (50 mg total) by mouth at bedtime. 30 tablet 3  . ondansetron (ZOFRAN) 8 MG tablet Take 1 tablet (8 mg total) by mouth every 8 (eight) hours as needed for nausea or vomiting. (Lori not taking: Reported on 02/24/2018) 30 tablet 3  . promethazine (PHENERGAN) 25 MG tablet Take 1 tablet (25 mg total) by mouth every 6 (six) hours as needed for nausea or vomiting. (Lori not taking: Reported on 02/24/2018) 30 tablet 3   No current facility-administered  medications on file prior to visit.     Allergies: No Known Allergies Past Medical History:  Past Medical History:  Diagnosis Date  . Anxiety    occ lorazepam  . Atypical meningioma of brain ([place]) 2018/2019   Resected, then RT Feb/Mar 2019.  No sign of residual dz at 04/2018 rad onc f/u.  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  Marland Kitchen Depression    zoloft in past--?wt gain.  . H/O mammogram 11/21/12   B/L   . History of radiation therapy 02/24/12-04/11/12   right breast/ 45Gy@1 .8Gyx71fx/boost=16Gy@2  Gya20fx.  Latest mammo and u/s 03/2013--normal.  . History of radiation therapy 11/30/17- 01/11/18   Right temporal lobe treated to 55.8 Gy with 31 fx of 1.8 Gy  . Palpitations    has taken Metoprolol for palpitations in the past.  . Taste impairment    s/p radiation therapy  . Tobacco dependence 09/05/2013   Past Surgical History:  Past Surgical History:  Procedure Laterality Date  . APPENDECTOMY  2008   emergency  . BRAIN TUMOR EXCISION  2018   Meningioma  . BREAST LUMPECTOMY  01/04/12   right breast  . CRANIECTOMY  10/19/2017   at [place]   Social History:  Social History   Socioeconomic History  . Marital status: Single    Spouse name: Not on file  . Number of children: Not on file  . Years of education: Not  on file  . Highest education level: Not on file  Occupational History  . Not on file  Social Needs  . Financial resource strain: Not on file  . Food insecurity:    Worry: Not on file    Inability: Not on file  . Transportation needs:    Medical: Not on file    Non-medical: Not on file  Tobacco Use  . Smoking status: Former Smoker    Packs/day: 0.50    Types: Cigarettes    Last attempt to quit: 10/17/2017    Years since quitting: 1.0  . Smokeless tobacco: Never Used  Substance and Sexual Activity  . Alcohol use: Yes     Alcohol/week: 5.0 standard drinks    Types: 5 Cans of beer per week  . Drug use: No  . Sexual activity: Not Currently  Lifestyle  . Physical activity:    Days per week: Not on file    Minutes per session: Not on file  . Stress: Not on file  Relationships  . Social connections:    Talks on phone: Not on file    Gets together: Not on file    Attends religious service: Not on file    Active member of club or organization: Not on file    Attends meetings of clubs or organizations: Not on file    Relationship status: Not on file  . Intimate partner violence:    Fear of current or ex partner: Not on file    Emotionally abused: Not on file    Physically abused: Not on file    Forced sexual activity: Not on file  Other Topics Concern  . Not on file  Social History Narrative   Single, no children.   Has a cat and a dog.   Orig from [place] in [place].   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   Tob: 20 pack-yr hx, current as of 09/05/13.   Alc: beer 1-2 per day avg, whisky with water 1 glass hs.   High protein/low carb diet.   Walks 3 x/week with dog.  Some biking.   Family History:  Family History  Problem Relation Age of Onset  . Anesthesia problems Mother   . Cancer Mother        breast  . Cancer Maternal Aunt        breast  . Cancer Paternal Grandfather        colon    Review of Systems: Constitutional: Denies fevers, chills or abnormal weight loss Eyes: Double vision Ears, nose, mouth, throat, and face: Denies mucositis or sore throat Respiratory: Denies cough, dyspnea or wheezes Cardiovascular: Denies palpitation, chest discomfort or lower extremity swelling Gastrointestinal:  Denies nausea, constipation, diarrhea GU: Denies dysuria or incontinence Skin: Denies abnormal skin rashes Neurological: Per HPI Musculoskeletal: Denies joint pain, back or neck discomfort. No decrease in ROM Behavioral/Psych: +anxiety  Physical Exam: Vitals:   11/02/18 1144  BP: 133/83    Pulse: 71  Resp: 18  Temp: 98.4 F (36.9 C)  SpO2: 99%   KPS: 90. General: Alert, cooperative, pleasant, in no acute distress Head: Craniotomy scar noted, dry and intact. EENT: No conjunctival injection or scleral icterus. Oral mucosa moist Lungs: Resp effort normal Cardiac: Regular rate and rhythm Abdomen: Soft, non-distended abdomen Skin: No rashes cyanosis or petechiae. Extremities: No clubbing or edema  Neurologic Exam: Mental Status: Awake, alert, attentive to examiner. Oriented to self and environment. Language is fluent with intact comprehension. Age advanced pyschomotor  slowing with 2/3 object recall at 5 minutes. Cranial Nerves: Visual acuity is grossly normal. Visual fields are full. Extra-ocular movements intact with horizontal diplopia on left gaze. No ptosis. Face is symmetric, tongue midline.  Some sensory loss right>left face Motor: Tone and bulk are normal. Power is full in both arms and legs. Reflexes are symmetric, no pathologic reflexes present. Intact finger to nose bilaterally Sensory: Intact to light touch and temperature Gait: Normal and tandem gait is normal.   Labs: I have reviewed the data as listed    Component Value Date/Time   NA 136 09/18/2013 0900   K 4.0 09/18/2013 0900   CL 103 09/18/2013 0900   CO2 26 09/18/2013 0900   GLUCOSE 82 09/18/2013 0900   BUN 11 09/18/2013 0900   CREATININE 0.64 09/18/2013 0900   CALCIUM 9.0 09/18/2013 0900   PROT 6.6 09/18/2013 0900   ALBUMIN 4.1 09/18/2013 0900   AST 13 09/18/2013 0900   ALT 10 09/18/2013 0900   ALKPHOS 68 09/18/2013 0900   BILITOT 0.6 09/18/2013 0900   GFRNONAA >90 05/30/2013 1535   GFRAA >90 05/30/2013 1535   Lab Results  Component Value Date   WBC 7.5 09/18/2013   NEUTROABS 4.8 09/18/2013   HGB 13.0 09/18/2013   HCT 39.6 09/18/2013   MCV 89.6 09/18/2013   PLT 325 09/18/2013    Imaging:  Toast Clinician Interpretation: I have personally reviewed the CNS images as listed.  My  interpretation, in the context of the Lori's clinical presentation, is stable disease  Mr Jeri Cos Wo Contrast  Result Date: 10/30/2018 CLINICAL DATA:  History of right temporal atypical meningioma WHO grade II resected in 10/2017. Completed radiation therapy in 12/2017. EXAM: MRI HEAD WITHOUT AND WITH CONTRAST TECHNIQUE: Multiplanar, multiecho pulse sequences of the brain and surrounding structures were obtained without and with intravenous contrast. CONTRAST:  56mL MULTIHANCE GADOBENATE DIMEGLUMINE 529 MG/ML IV SOLN COMPARISON:  04/27/2018 brain MRI from [facility] FINDINGS: Brain: A resection cavity in the a right temporal lobe is unchanged in size with chronic blood products again noted. A small amount of surrounding nonenhancing T2 hyperintensity, most notably posterior to the resection cavity, is stable to slightly increased in likely reflects post treatment gliosis. A small amount of curvilinear enhancement along the superolateral margin of the resection cavity is slightly less prominent than on the prior study. Mild dural thickening and enhancement are again seen over the right cerebral convexity subjacent to the craniotomy, likely postoperative. No masslike enhancement is seen. An 11 mm thick extra-axial fluid collection subjacent to the craniotomy is unchanged and does not result in significant mass effect. There is no acute infarct. Scattered punctate foci of T2 hyperintensity in the cerebral white matter bilaterally are unchanged and nonspecific though most often seen with chronic small vessel ischemia. Aside from the postoperative encephalomalacia in the right temporal lobe, cerebral volume appears to be within normal limits for age. A developmental venous anomaly is incidentally noted in the right basal ganglia. Vascular: Major intracranial vascular flow voids are preserved. Skull and upper cervical spine: Right pterional craniotomy. Sinuses/Orbits: Unremarkable orbits. Clear paranasal sinuses.  Persistent moderately large right mastoid effusion. Other: None. IMPRESSION: Post treatment changes without evidence of recurrent meningioma. Electronically Signed   By: Logan Bores M.D.   On: 10/30/2018 16:04     Assessment/Plan Atypical meningioma of brain (HCC)  Diplopia Meningioma WHO grade II   Ms. Koman is clinically and radiographically stable today.    We recommend she return in  6 months with an MRI brain for review, or sooner as needed for clinical signs/symptoms.  We appreciate the opportunity to participate in the care of CAEDENCE SNOWDEN.   All questions were answered. The Lori knows to call the clinic with any problems, questions or concerns. No barriers to learning were detected.  The total time spent in the encounter was 25 minutes and more than 50% was on counseling and review of test results   Ventura Sellers, MD Medical Director of Neuro-Oncology [facility] at [place] 11/02/18 11:35 AM

## 2018-11-07 ENCOUNTER — Other Ambulatory Visit: Payer: Self-pay | Admitting: *Deleted

## 2018-11-07 DIAGNOSIS — D420 Neoplasm of uncertain behavior of cerebral meninges: Secondary | ICD-10-CM

## 2018-12-04 ENCOUNTER — Encounter: Payer: Self-pay | Admitting: Internal Medicine

## 2019-01-01 ENCOUNTER — Ambulatory Visit: Payer: Medicaid Other

## 2019-03-29 ENCOUNTER — Other Ambulatory Visit: Payer: Self-pay | Admitting: *Deleted

## 2019-03-29 ENCOUNTER — Telehealth: Payer: Self-pay | Admitting: *Deleted

## 2019-03-29 ENCOUNTER — Encounter: Payer: Self-pay | Admitting: Internal Medicine

## 2019-03-29 DIAGNOSIS — D420 Neoplasm of uncertain behavior of cerebral meninges: Secondary | ICD-10-CM

## 2019-03-29 NOTE — Telephone Encounter (Signed)
Patient reported that for 1 week she has had headache and dizziness similar to initial diagnosis for brain tumor.  She states she has been taking decongestant OTC with no relief from pressure and pain on right side of head.  Per Dr. Mickeal Skinner ok to move up MRI that was expected in July.  Rescheduled and authorized to early next week.    Patient given other headache management options (tylenol/ice/continue decongestant/flonase) to see if she can get relief prior to scan.  If she develops any sudden visual changes directed to go to ER and if she the dizziness worsens please call back for additional guidance.  No further questions from the patient at this time.

## 2019-04-02 ENCOUNTER — Other Ambulatory Visit: Payer: Self-pay

## 2019-04-02 ENCOUNTER — Ambulatory Visit (HOSPITAL_COMMUNITY)
Admission: RE | Admit: 2019-04-02 | Discharge: 2019-04-02 | Disposition: A | Discharge status: Home / Self Care | Payer: Medicaid Other | Source: Ambulatory Visit | Attending: Internal Medicine | Admitting: Internal Medicine

## 2019-04-02 DIAGNOSIS — D420 Neoplasm of uncertain behavior of cerebral meninges: Secondary | ICD-10-CM | POA: Insufficient documentation

## 2019-04-02 MED ORDER — GADOBUTROL 1 MMOL/ML IV SOLN
6.0000 mL | Freq: Once | INTRAVENOUS | Status: AC | PRN
  Administered 2019-04-02: 6 mL via INTRAVENOUS

## 2019-04-05 ENCOUNTER — Other Ambulatory Visit: Payer: Self-pay

## 2019-04-05 ENCOUNTER — Inpatient Hospital Stay: Payer: Medicaid Other | Attending: Internal Medicine | Admitting: Internal Medicine

## 2019-04-05 VITALS — BP 120/80 | HR 75 | Temp 98.5°F | Resp 17 | Ht 64.0 in | Wt 159.5 lb

## 2019-04-05 DIAGNOSIS — D420 Neoplasm of uncertain behavior of cerebral meninges: Secondary | ICD-10-CM

## 2019-04-05 DIAGNOSIS — Z853 Personal history of malignant neoplasm of breast: Secondary | ICD-10-CM | POA: Diagnosis not present

## 2019-04-05 DIAGNOSIS — D320 Benign neoplasm of cerebral meninges: Secondary | ICD-10-CM | POA: Insufficient documentation

## 2019-04-05 DIAGNOSIS — H532 Diplopia: Secondary | ICD-10-CM | POA: Diagnosis not present

## 2019-04-05 DIAGNOSIS — R51 Headache: Secondary | ICD-10-CM

## 2019-04-05 MED ORDER — DEXAMETHASONE 4 MG PO TABS: 4.0000 mg | ORAL_TABLET | Freq: Every day | ORAL | 0 refills | Status: DC

## 2019-04-05 NOTE — Progress Notes (Signed)
[facility] [place], [place] [address] [phone]   Interval Evaluation  Date of Service: 04/05/19 Patient Name: Lori Mcmillan Patient MRN: [id number] Patient DOB: Sep 26, 1961 Provider: Ventura Sellers, MD  Identifying Statement:  Lori Mcmillan is a 58 y.o. 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 Isidore Moos  Interval History:  Lori Mcmillan presents today for follow up after MRI brain.  She describes recurrence in headaches over the past week.  Double vision is persistent and chronic, not improved but also not worsening. Been using ibuprofen, benadryl and meclizine which has helped considerably.   Prior (05/03/18): Completed radiation at the end of March.  She has MRI brain for review today.  She describes no new neurologic deficits.  Does have frequent moderate headaches, has been taking Tylenol daily for the past 3 months.  Continues to have double vision which limits her ability to drive, read, perform some ADLs.  She has noted history of grade I breast cancer treated with radiation several years ago.  Medications: Current Outpatient Medications on File Prior to Visit  Medication Sig Dispense Refill   diphenhydrAMINE (BENADRYL) 25 MG tablet Take 25 mg by mouth every 6 (six) hours as needed.     ibuprofen (ADVIL) 200 MG tablet Take 200 mg by mouth every 6 (six) hours as needed.     meclizine (ANTIVERT) 25 MG tablet Take 25 mg by mouth 3 (three) times daily as needed for dizziness.     acetaminophen (TYLENOL) 325 MG tablet Take by mouth.     amitriptyline (ELAVIL) 50 MG tablet Take 1 tablet (50 mg total) by mouth at bedtime. 30 tablet 3   ondansetron (ZOFRAN) 8 MG tablet Take 1 tablet (8 mg total) by  mouth every 8 (eight) hours as needed for nausea or vomiting. (Patient not taking: Reported on 02/24/2018) 30 tablet 3   promethazine (PHENERGAN) 25 MG tablet Take 1 tablet (25 mg total) by mouth every 6 (six) hours as needed for nausea or vomiting. (Patient not taking: Reported on 02/24/2018) 30 tablet 3   No current facility-administered medications on file prior to visit.     Allergies: No Known Allergies Past Medical History:  Past Medical History:  Diagnosis Date   Anxiety    occ lorazepam   Atypical meningioma of brain ([place]) 2018/2019   Resected, then RT Feb/Mar 2019.  No sign of residual dz at 04/2018 rad onc f/u.   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   Depression    zoloft in past--?wt gain.   H/O mammogram 11/21/12   B/L    History of radiation therapy 02/24/12-04/11/12   right breast/ 45Gy@1 .8Gyx2fx/boost=16Gy@2  Gya16fx.  Latest mammo and u/s 03/2013--normal.   History of radiation therapy 11/30/17- 01/11/18   Right temporal lobe treated to 55.8 Gy with 31 fx of 1.8 Gy   Palpitations    has taken Metoprolol for palpitations in the past.   Taste impairment    s/p radiation therapy   Tobacco dependence 09/05/2013   Past Surgical History:  Past Surgical History:  Procedure Laterality Date   APPENDECTOMY  2008   emergency   BRAIN TUMOR EXCISION  2018   Meningioma   BREAST LUMPECTOMY  01/04/12   right breast   CRANIECTOMY  10/19/2017   at [facility]   Social History:  Social History   Socioeconomic History   Marital status: Single    Spouse name: Not on file   Number of children: Not on file   Years of education: Not on file   Highest education level: Not on file  Occupational History   Not on file  Social Needs   Financial resource strain: Not on file   Food insecurity    Worry: Not on file    Inability:  Not on file   Transportation needs    Medical: Not on file    Non-medical: Not on file  Tobacco Use   Smoking status: Former Smoker    Packs/day: 0.50    Types: Cigarettes    Quit date: 10/17/2017    Years since quitting: 1.4   Smokeless tobacco: Never Used  Substance and Sexual Activity   Alcohol use: Yes    Alcohol/week: 5.0 standard drinks    Types: 5 Cans of beer per week   Drug use: No   Sexual activity: Not Currently  Lifestyle   Physical activity    Days per week: Not on file    Minutes per session: Not on file   Stress: Not on file  Relationships   Social connections    Talks on phone: Not on file    Gets together: Not on file    Attends religious service: Not on file    Active member of club or organization: Not on file    Attends meetings of clubs or organizations: Not on file    Relationship status: Not on file   Intimate partner violence    Fear of current or ex partner: Not on file    Emotionally abused: Not on file    Physically abused: Not on file    Forced sexual activity: Not on file  Other Topics Concern   Not on file  Social History Narrative   Single, no children.   Has a cat and a dog.   Orig from [place] in [place].   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   Tob: 20 pack-yr hx, current as of 09/05/13.   Alc: beer 1-2 per day avg, whisky with water 1 glass hs.   High protein/low carb diet.   Walks 3 x/week with dog.  Some biking.   Family History:  Family History  Problem Relation Age of Onset   Anesthesia problems Mother    Cancer Mother        breast   Cancer Maternal Aunt        breast   Cancer Paternal Grandfather        colon    Review of Systems: Constitutional: Denies fevers, chills or abnormal weight loss Eyes: Double vision Ears, nose, mouth, throat, and face: Denies mucositis or sore throat Respiratory: Denies cough, dyspnea or wheezes Cardiovascular: Denies palpitation, chest discomfort or lower extremity  swelling Gastrointestinal:  Denies nausea, constipation, diarrhea GU: Denies dysuria or incontinence Skin: Denies abnormal skin rashes Neurological: Per HPI Musculoskeletal: Denies joint pain, back or neck discomfort. No decrease in ROM Behavioral/Psych: +anxiety  Physical Exam: Vitals:   04/05/19 0952  BP: 120/80  Pulse: 75  Resp: 17  Temp: 98.5 F (36.9 C)  SpO2: 100%   KPS: 90. General: Alert, cooperative, pleasant, in no acute  distress Head: Craniotomy scar noted, dry and intact. EENT: No conjunctival injection or scleral icterus. Oral mucosa moist Lungs: Resp effort normal Cardiac: Regular rate and rhythm Abdomen: Soft, non-distended abdomen Skin: No rashes cyanosis or petechiae. Extremities: No clubbing or edema  Neurologic Exam: Mental Status: Awake, alert, attentive to examiner. Oriented to self and environment. Language is fluent with intact comprehension. Age advanced pyschomotor slowing with 2/3 object recall at 5 minutes. Cranial Nerves: Visual acuity is grossly normal. Visual fields are full. Extra-ocular movements intact with horizontal diplopia on left gaze. No ptosis. Face is symmetric, tongue midline.  Some sensory loss right>left face Motor: Tone and bulk are normal. Power is full in both arms and legs. Reflexes are symmetric, no pathologic reflexes present. Intact finger to nose bilaterally Sensory: Intact to light touch and temperature Gait: Normal and tandem gait is normal.   Labs: I have reviewed the data as listed    Component Value Date/Time   NA 136 09/18/2013 0900   K 4.0 09/18/2013 0900   CL 103 09/18/2013 0900   CO2 26 09/18/2013 0900   GLUCOSE 82 09/18/2013 0900   BUN 11 09/18/2013 0900   CREATININE 0.64 09/18/2013 0900   CALCIUM 9.0 09/18/2013 0900   PROT 6.6 09/18/2013 0900   ALBUMIN 4.1 09/18/2013 0900   AST 13 09/18/2013 0900   ALT 10 09/18/2013 0900   ALKPHOS 68 09/18/2013 0900   BILITOT 0.6 09/18/2013 0900   GFRNONAA >90  05/30/2013 1535   GFRAA >90 05/30/2013 1535   Lab Results  Component Value Date   WBC 7.5 09/18/2013   NEUTROABS 4.8 09/18/2013   HGB 13.0 09/18/2013   HCT 39.6 09/18/2013   MCV 89.6 09/18/2013   PLT 325 09/18/2013    Imaging:  [facility] Clinician Interpretation: I have personally reviewed the CNS images as listed.  My interpretation, in the context of the patient's clinical presentation, is stable disease  Mr Jeri Cos Wo Contrast  Result Date: 04/02/2019 CLINICAL DATA:  Dizziness, pain and pressure. Breast cancer. Meningioma. EXAM: MRI HEAD WITHOUT AND WITH CONTRAST TECHNIQUE: Multiplanar, multiecho pulse sequences of the brain and surrounding structures were obtained without and with intravenous contrast. CONTRAST:  Gadavist 6 mL. COMPARISON:  10/30/2018. FINDINGS: Brain: No acute infarction, hemorrhage, hydrocephalus, or mass lesion. Chronic changes of RIGHT temporal encephalomalacia, gliosis, and blood products related to an old meningioma resection. Post infusion, no abnormal enhancement of the brain or meninges aside from postsurgical change related to the craniotomy/cranioplasty. Previously identified enhancement along the superior margin of the resection cavity continues to show involution, now minimal. No midline shift. No vasogenic edema. No recurrent tumor or new lesions elsewhere.Chronic epidural seroma. Elsewhere, mild small vessel disease. Incidental developmental venous anomaly RIGHT basal ganglia. Vascular: Normal flow voids. Skull and upper cervical spine: Normal marrow signal. Unremarkable craniotomy. Sinuses/Orbits: Negative. Other: RIGHT mastoid effusion continues to involute. IMPRESSION: Satisfactory postoperative appearance. No evidence of recurrent tumor, or acute intracranial abnormality. Chronic changes related to RIGHT temporal meningioma resection. Electronically Signed   By: Staci Righter M.D.   On: 04/02/2019 16:35     Assessment/Plan Atypical meningioma of brain [facility])  [D42.0]  Lori Mcmillan is clinically and radiographically stable today.  We provided script for 5 day course of dexamethasone (4mg  daily) to help bridge off of analgesia for recent breakthrough headaches.    We recommend she return in 6 months with an MRI brain for review, or sooner as needed for clinical signs/symptoms.  We appreciate the opportunity to  participate in the care of Lori Mcmillan.   All questions were answered. The patient knows to call the clinic with any problems, questions or concerns. No barriers to learning were detected.  The total time spent in the encounter was 25 minutes and more than 50% was on counseling and review of test results   Ventura Sellers, MD Medical Director of Neuro-Oncology [facility] at [place] 04/05/19 10:00 AM

## 2019-04-06 ENCOUNTER — Telehealth: Payer: Self-pay | Admitting: Internal Medicine

## 2019-04-06 NOTE — Telephone Encounter (Signed)
No los per 6/18. °

## 2019-04-30 ENCOUNTER — Other Ambulatory Visit: Payer: Medicaid Other

## 2019-04-30 ENCOUNTER — Other Ambulatory Visit: Payer: Self-pay | Admitting: *Deleted

## 2019-05-09 ENCOUNTER — Ambulatory Visit: Payer: Medicaid Other | Admitting: Internal Medicine

## 2019-06-17 ENCOUNTER — Encounter: Payer: Self-pay | Admitting: Family Medicine

## 2019-10-09 ENCOUNTER — Other Ambulatory Visit: Payer: Self-pay | Admitting: *Deleted

## 2019-10-09 DIAGNOSIS — D420 Neoplasm of uncertain behavior of cerebral meninges: Secondary | ICD-10-CM

## 2019-10-15 ENCOUNTER — Telehealth: Payer: Self-pay | Admitting: Internal Medicine

## 2019-10-15 NOTE — Telephone Encounter (Signed)
Scheduled apt per 12/28 sch message - pt is aware of appt date and time

## 2019-10-23 ENCOUNTER — Ambulatory Visit (HOSPITAL_COMMUNITY): Admission: RE | Admit: 2019-10-23 | Payer: Medicaid Other | Source: Ambulatory Visit

## 2019-10-23 ENCOUNTER — Other Ambulatory Visit: Payer: Self-pay | Admitting: Radiation Therapy

## 2019-10-23 ENCOUNTER — Telehealth: Payer: Self-pay | Admitting: Internal Medicine

## 2019-10-23 NOTE — Telephone Encounter (Signed)
Returned patient's phone call regarding rescheduling 01/11 appointment, per patient's request appointment has moved to 01/19.

## 2019-10-29 ENCOUNTER — Ambulatory Visit: Payer: Medicaid Other | Admitting: Internal Medicine

## 2019-10-31 ENCOUNTER — Other Ambulatory Visit: Payer: Self-pay

## 2019-10-31 ENCOUNTER — Ambulatory Visit (HOSPITAL_COMMUNITY)
Admission: RE | Admit: 2019-10-31 | Discharge: 2019-10-31 | Disposition: A | Discharge status: Home / Self Care | Payer: Medicaid Other | Source: Ambulatory Visit | Attending: Internal Medicine | Admitting: Internal Medicine

## 2019-10-31 DIAGNOSIS — D420 Neoplasm of uncertain behavior of cerebral meninges: Secondary | ICD-10-CM | POA: Diagnosis not present

## 2019-10-31 MED ORDER — GADOBUTROL 1 MMOL/ML IV SOLN
7.0000 mL | Freq: Once | INTRAVENOUS | Status: AC | PRN
  Administered 2019-10-31: 7 mL via INTRAVENOUS

## 2019-11-06 ENCOUNTER — Inpatient Hospital Stay: Payer: Medicaid Other | Attending: Internal Medicine | Admitting: Internal Medicine

## 2019-11-06 ENCOUNTER — Other Ambulatory Visit: Payer: Self-pay

## 2019-11-06 VITALS — BP 122/74 | HR 85 | Temp 98.2°F | Resp 17 | Ht 64.0 in | Wt 167.6 lb

## 2019-11-06 DIAGNOSIS — H938X1 Other specified disorders of right ear: Secondary | ICD-10-CM | POA: Diagnosis not present

## 2019-11-06 DIAGNOSIS — Z853 Personal history of malignant neoplasm of breast: Secondary | ICD-10-CM | POA: Diagnosis not present

## 2019-11-06 DIAGNOSIS — Z923 Personal history of irradiation: Secondary | ICD-10-CM | POA: Diagnosis not present

## 2019-11-06 DIAGNOSIS — Z87891 Personal history of nicotine dependence: Secondary | ICD-10-CM | POA: Insufficient documentation

## 2019-11-06 DIAGNOSIS — Z7952 Long term (current) use of systemic steroids: Secondary | ICD-10-CM | POA: Diagnosis not present

## 2019-11-06 DIAGNOSIS — R002 Palpitations: Secondary | ICD-10-CM | POA: Insufficient documentation

## 2019-11-06 DIAGNOSIS — R519 Headache, unspecified: Secondary | ICD-10-CM | POA: Diagnosis not present

## 2019-11-06 DIAGNOSIS — Z79899 Other long term (current) drug therapy: Secondary | ICD-10-CM | POA: Insufficient documentation

## 2019-11-06 DIAGNOSIS — D420 Neoplasm of uncertain behavior of cerebral meninges: Secondary | ICD-10-CM | POA: Insufficient documentation

## 2019-11-06 DIAGNOSIS — H532 Diplopia: Secondary | ICD-10-CM | POA: Diagnosis not present

## 2019-11-06 DIAGNOSIS — F419 Anxiety disorder, unspecified: Secondary | ICD-10-CM | POA: Insufficient documentation

## 2019-11-06 DIAGNOSIS — F329 Major depressive disorder, single episode, unspecified: Secondary | ICD-10-CM | POA: Insufficient documentation

## 2019-11-06 NOTE — Progress Notes (Signed)
[facility] at [place], [place] [address] [phone]   Interval Evaluation  Date of Service: 11/06/19 Patient Name: Lori Mcmillan Patient MRN: [id number] Patient DOB: [birth year] Provider: Ventura Sellers, MD  Identifying Statement:  Lori Mcmillan is a 59 y.o. 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 Isidore Moos  Interval History:  Lori Mcmillan presents today for follow up after MRI brain.  She describes overall stability of head pain symptoms.  Does complain of ear fullness at times associated with he sensory symptom. Double vision is persistent and chronic.  Otherwise no new or progressive neurologic deficits.  Prior (05/03/18): Completed radiation at the end of March.  She has MRI brain for review today.  She describes no new neurologic deficits.  Does have frequent moderate headaches, has been taking Tylenol daily for the past 3 months.  Continues to have double vision which limits her ability to drive, read, perform some ADLs.  She has noted history of grade I breast cancer treated with radiation several years ago.  Medications: Current Outpatient Medications on File Prior to Visit  Medication Sig Dispense Refill  . acetaminophen (TYLENOL) 325 MG tablet Take by mouth.    . dexamethasone (DECADRON) 4 MG tablet Take 1 tablet (4 mg total) by mouth daily. 5 tablet 0  . diphenhydrAMINE (BENADRYL) 25 MG tablet Take 25 mg by mouth every 6 (six) hours as needed.    Marland Kitchen ibuprofen (ADVIL) 200 MG tablet Take 200 mg by mouth every 6 (six) hours as needed.    . meclizine (ANTIVERT) 25 MG tablet Take 25 mg by mouth 3 (three) times daily as needed for dizziness.    Marland Kitchen amitriptyline (ELAVIL) 50 MG tablet Take 1 tablet (50 mg  total) by mouth at bedtime. 30 tablet 3  . ondansetron (ZOFRAN) 8 MG tablet Take 1 tablet (8 mg total) by mouth every 8 (eight) hours as needed for nausea or vomiting. (Patient not taking: Reported on 02/24/2018) 30 tablet 3  . promethazine (PHENERGAN) 25 MG tablet Take 1 tablet (25 mg total) by mouth every 6 (six) hours as needed for nausea or vomiting. (Patient not taking: Reported on 02/24/2018) 30 tablet 3   No current facility-administered medications on file prior to visit.    Allergies: No Known Allergies Past Medical History:  Past Medical History:  Diagnosis Date  . Anxiety    occ lorazepam  . Atypical meningioma of brain ([facility]) 2018/2019   Resected, then RT Feb/Mar 2019.  No sign of residual dz at 04/2018 rad onc f/u and 10/2018 neuro-onc f/u. 03/2019 MRI brain->no resid/no recurrence.  . Breast cancer ([place] 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  Marland Kitchen Depression    zoloft in past--?wt gain.  . Diplopia    chronic (meningioma-related)  . H/O mammogram 11/21/12   B/L   . History of radiation therapy 02/24/12-04/11/12   right breast/ 45Gy@1 .8Gyx5fx/boost=16Gy@2  Gya86fx.  Latest mammo and u/s 03/2013--normal.  . History of radiation therapy 11/30/17- 01/11/18   Right temporal lobe treated to 55.8 Gy with 31 fx of 1.8 Gy  . Palpitations    has taken Metoprolol for palpitations in the past.  . Taste impairment  s/p radiation therapy  . Tobacco dependence 09/05/2013   Past Surgical History:  Past Surgical History:  Procedure Laterality Date  . APPENDECTOMY  2008   emergency  . BRAIN TUMOR EXCISION  2018   Meningioma  . BREAST LUMPECTOMY  01/04/12   right breast  . CRANIECTOMY  10/19/2017   at [facility]   Social History:  Social History   Socioeconomic History  . Marital status: Single    Spouse name: Not on file  . Number of children: Not on file  . Years of  education: Not on file  . Highest education level: Not on file  Occupational History  . Not on file  Tobacco Use  . Smoking status: Former Smoker    Packs/day: 0.50    Types: Cigarettes    Quit date: 10/17/2017    Years since quitting: 2.0  . Smokeless tobacco: Never Used  Substance and Sexual Activity  . Alcohol use: Yes    Alcohol/week: 5.0 standard drinks    Types: 5 Cans of beer per week  . Drug use: No  . Sexual activity: Not Currently  Other Topics Concern  . Not on file  Social History Narrative   Single, no children.   Has a cat and a dog.   Orig from [place] in [place].   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   Tob: 20 pack-yr hx, current as of 09/05/13.   Alc: beer 1-2 per day avg, whisky with water 1 glass hs.   High protein/low carb diet.   Walks 3 x/week with dog.  Some biking.   Social Determinants of Health   Financial Resource Strain:   . Difficulty of Paying Living Expenses: Not on file  Food Insecurity:   . Worried About Charity fundraiser in the Last Year: Not on file  . Ran Out of Food in the Last Year: Not on file  Transportation Needs:   . Lack of Transportation (Medical): Not on file  . Lack of Transportation (Non-Medical): Not on file  Physical Activity:   . Days of Exercise per Week: Not on file  . Minutes of Exercise per Session: Not on file  Stress:   . Feeling of Stress : Not on file  Social Connections:   . Frequency of Communication with Friends and Family: Not on file  . Frequency of Social Gatherings with Friends and Family: Not on file  . Attends Religious Services: Not on file  . Active Member of Clubs or Organizations: Not on file  . Attends Archivist Meetings: Not on file  . Marital Status: Not on file  Intimate Partner Violence:   . Fear of Current or Ex-Partner: Not on file  . Emotionally Abused: Not on file  . Physically Abused: Not on file  . Sexually Abused: Not on file   Family History:  Family History   Problem Relation Age of Onset  . Anesthesia problems Mother   . Cancer Mother        breast  . Cancer Maternal Aunt        breast  . Cancer Paternal Grandfather        colon    Review of Systems: Constitutional: Denies fevers, chills or abnormal weight loss Eyes: Double vision Ears, nose, mouth, throat, and face: Denies mucositis or sore throat Respiratory: Denies cough, dyspnea or wheezes Cardiovascular: Denies palpitation, chest discomfort or lower extremity swelling Gastrointestinal:  Denies nausea, constipation,  diarrhea GU: Denies dysuria or incontinence Skin: Denies abnormal skin rashes Neurological: Per HPI Musculoskeletal: Denies joint pain, back or neck discomfort. No decrease in ROM Behavioral/Psych: +anxiety  Physical Exam: Vitals:   11/06/19 1225  BP: 122/74  Pulse: 85  Resp: 17  Temp: 98.2 F (36.8 C)  SpO2: 100%   KPS: 90. General: Alert, cooperative, pleasant, in no acute distress Head: Craniotomy scar noted, dry and intact. EENT: No conjunctival injection or scleral icterus. Oral mucosa moist Lungs: Resp effort normal Cardiac: Regular rate and rhythm Abdomen: Soft, non-distended abdomen Skin: No rashes cyanosis or petechiae. Extremities: No clubbing or edema  Neurologic Exam: Mental Status: Awake, alert, attentive to examiner. Oriented to self and environment. Language is fluent with intact comprehension. Age advanced pyschomotor slowing with 2/3 object recall at 5 minutes. Cranial Nerves: Visual acuity is grossly normal. Visual fields are full. Extra-ocular movements intact with horizontal diplopia on left gaze. No ptosis. Face is symmetric, tongue midline.  Some sensory loss right>left face Motor: Tone and bulk are normal. Power is full in both arms and legs. Reflexes are symmetric, no pathologic reflexes present. Intact finger to nose bilaterally Sensory: Intact to light touch and temperature Gait: Normal and tandem gait is normal.   Labs: I  have reviewed the data as listed    Component Value Date/Time   NA 136 09/18/2013 0900   K 4.0 09/18/2013 0900   CL 103 09/18/2013 0900   CO2 26 09/18/2013 0900   GLUCOSE 82 09/18/2013 0900   BUN 11 09/18/2013 0900   CREATININE 0.64 09/18/2013 0900   CALCIUM 9.0 09/18/2013 0900   PROT 6.6 09/18/2013 0900   ALBUMIN 4.1 09/18/2013 0900   AST 13 09/18/2013 0900   ALT 10 09/18/2013 0900   ALKPHOS 68 09/18/2013 0900   BILITOT 0.6 09/18/2013 0900   GFRNONAA >90 05/30/2013 1535   GFRAA >90 05/30/2013 1535   Lab Results  Component Value Date   WBC 7.5 09/18/2013   NEUTROABS 4.8 09/18/2013   HGB 13.0 09/18/2013   HCT 39.6 09/18/2013   MCV 89.6 09/18/2013   PLT 325 09/18/2013    Imaging:  [facility] Clinician Interpretation: I have personally reviewed the CNS images as listed.  My interpretation, in the context of the patient's clinical presentation, is stable disease  MR Brain W Wo Contrast  Result Date: 10/31/2019 CLINICAL DATA:  Previous resection of right middle cranial fossa meningioma. History of breast cancer. EXAM: MRI HEAD WITHOUT AND WITH CONTRAST TECHNIQUE: Multiplanar, multiecho pulse sequences of the brain and surrounding structures were obtained without and with intravenous contrast. CONTRAST:  30mL GADAVIST GADOBUTROL 1 MMOL/ML IV SOLN COMPARISON:  04/02/2019.  10/30/2018. FINDINGS: Brain: No change. Previous right temporal craniotomy for resection of meningioma. 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 Post resection space is stable. Surrounding brain gliosis is stable. After contrast administration, no abnormal enhancement occurs. Persistent extra-axial fluid at the operative site is stable. Persistent right mastoid effusion. Vascular: Major vessels at the base of the brain show flow. Skull and upper cervical spine: Otherwise negative Sinuses/Orbits: Clear/normal Other: None IMPRESSION: Stable examination. No evidence of residual or recurrent  meningioma. Postsurgical changes in the right temporal lobe. Persistent right mastoid effusion. Electronically Signed   By: Nelson Chimes M.D.   On: 10/31/2019 13:01     Assessment/Plan Atypical meningioma of brain [facility]) [D42.0]  Lori Mcmillan is clinically and radiographically stable today.    We recommend she return in 6 months with an MRI brain  for review, or sooner as needed for clinical signs/symptoms.  For mononeuropathy we are happy to provide neuropathic pain medication such as gabapentin if desired.  Recommended ENT evaluation for chronic ear fullness.  We appreciate the opportunity to participate in the care of Lori Mcmillan.   All questions were answered. The patient knows to call the clinic with any problems, questions or concerns. No barriers to learning were detected.  The total time spent in the encounter was 30 minutes and more than 50% was on counseling and review of test results   Ventura Sellers, MD Medical Director of Neuro-Oncology [facility] at [place] 11/06/19 12:29 PM

## 2019-11-07 ENCOUNTER — Telehealth: Payer: Self-pay | Admitting: Internal Medicine

## 2019-11-07 NOTE — Telephone Encounter (Signed)
Scheduled appt per 1/19 los.  Sent a message to HIM pool to get a calendar mailed out. 

## 2019-11-13 ENCOUNTER — Telehealth: Payer: Self-pay | Admitting: *Deleted

## 2019-11-13 NOTE — Telephone Encounter (Signed)
Received call from Dr. Pollie Friar office ENT advising that they do not accept this patients insurance and would proceed with the referral if the patient wanted to pay out of pocket.    Processing referral to [facility] [place] [facility] [phone].  Sent request and office notes to [phone].

## 2019-11-21 ENCOUNTER — Encounter: Payer: Self-pay | Admitting: *Deleted

## 2019-11-23 LAB — COMPREHENSIVE METABOLIC PANEL
Albumin: 4.6 (ref 3.5–5.0)
Calcium: 9.3 (ref 8.7–10.7)
GFR calc Af Amer: 113
GFR calc non Af Amer: 98
Globulin: 2.7

## 2019-11-23 LAB — LIPID PANEL
Cholesterol: 222 — AB (ref 0–200)
HDL: 68 (ref 35–70)
LDL Cholesterol: 130
LDl/HDL Ratio: 1.9
Triglycerides: 135 (ref 40–160)

## 2019-11-23 LAB — BASIC METABOLIC PANEL
BUN: 9 (ref 4–21)
CO2: 19 (ref 13–22)
Chloride: 104 (ref 99–108)
Creatinine: 0.7 (ref 0.5–1.1)
Glucose: 89
Potassium: 4.3 (ref 3.4–5.3)
Sodium: 140 (ref 137–147)

## 2019-11-23 LAB — HEPATIC FUNCTION PANEL
ALT: 14 (ref 7–35)
AST: 17 (ref 13–35)
Alkaline Phosphatase: 122 (ref 25–125)
Bilirubin, Total: 0.3

## 2019-11-23 LAB — CBC AND DIFFERENTIAL
HCT: 40 (ref 36–46)
Hemoglobin: 13.1 (ref 12.0–16.0)
Neutrophils Absolute: 4.3
Platelets: 323 (ref 150–399)
WBC: 6.6

## 2019-11-23 LAB — CBC: RBC: 4.52 (ref 3.87–5.11)

## 2019-12-19 ENCOUNTER — Telehealth: Payer: Medicaid Other | Admitting: Family

## 2019-12-19 DIAGNOSIS — Z20822 Contact with and (suspected) exposure to covid-19: Secondary | ICD-10-CM

## 2019-12-19 NOTE — Progress Notes (Signed)
E-Visit for Corona Virus Screening  Your current symptoms could be consistent with the coronavirus.  Many health care providers can now test patients at their office but not all are.  [facility] has multiple testing sites. For information on our COVID testing locations and hours go to [email or web address]  We are enrolling you in our MyChart Home Monitoring for COVID19 . Daily you will receive a questionnaire within the MyChart website. Our COVID 19 response team will be monitoring your responses daily.  Testing Information: The COVID-19 Community Testing sites will begin testing BY APPOINTMENT ONLY.  You can schedule online at [email or web address]  If you do not have access to a smart phone or computer you may call [phone] for an appointment.   Additional testing sites in the Community:  . For [facility] Testing sites in [place]  https://www.cvs.com/minuteclinic/covid-19-testing  . For Pop-up testing sites in [place]  https://covid19.ncdhhs.gov/about-covid-19/testing/find-my-testing-place/pop-testing-sites  . For Testing sites with regular hours https://onsms.org/[place]/  . For [facility] https://tapmedicine.com/covid-19-community-outreach-testing/  . For [facility] https://www.[place].gov[place]/human-services/health-department/coronavirus-covid-19-info/covid-19-testing  . For [place] testing in [place] and [place] https://www.[place].gov/[place]/human-services/health-department/coronavirus-covid-19-info/covid-19-testing  . For [facility] testing in [place]   https://lhi.care/covidtesting  For  more information about community testing call [phone]   Please quarantine yourself while awaiting your test results. Please stay home for a minimum of 10 days from the first day of illness with improving symptoms and you have had 24 hours of no fever (without the use of Tylenol (Acetaminophen)  Motrin (Ibuprofen) or any fever reducing medication).  Also - Do not get tested prior to returning to work because once you have had a positive test the test can stay positive for more then a month in some cases.   You should wear a mask or cloth face covering over your nose and mouth if you must be around other people or animals, including pets (even at home). Try to stay at least 6 feet away from other people. This will protect the people around you.  Please continue good preventive care measures, including:  frequent hand-washing, avoid touching your face, cover coughs/sneezes, stay out of crowds and keep a 6 foot distance from others.  COVID-19 is a respiratory illness with symptoms that are similar to the flu. Symptoms are typically mild to moderate, but there have been cases of severe illness and death due to the virus.   The following symptoms may appear 2-14 days after exposure: . Fever . Cough . Shortness of breath or difficulty breathing . Chills . Repeated shaking with chills . Muscle pain . Headache . Sore throat . New loss of taste or smell . Fatigue . Congestion or runny nose . Nausea or vomiting . Diarrhea  Go to the nearest hospital ED for assessment if fever/cough/breathlessness are severe or illness seems like a threat to life.  It is vitally important that if you feel that you have an infection such as this virus or any other virus that you stay home and away from places where you may spread it to others.  You should avoid contact with people age 65 and older.   You can use medication such as A prescription cough medication called Tessalon Perles 100 mg. You may take 1-2 capsules every 8 hours as needed for cough.  You may also take acetaminophen (Tylenol) as needed for fever.  Reduce your risk of any infection by using the same precautions used for avoiding the common cold or flu:  . Wash your hands   often with soap and warm water for at least 20 seconds.  If soap and  water are not readily available, use an alcohol-based hand sanitizer with at least 60% alcohol.  . If coughing or sneezing, cover your mouth and nose by coughing or sneezing into the elbow areas of your shirt or coat, into a tissue or into your sleeve (not your hands). . Avoid shaking hands with others and consider head nods or verbal greetings only. . Avoid touching your eyes, nose, or mouth with unwashed hands.  . Avoid close contact with people who are sick. . Avoid places or events with large numbers of people in one location, like concerts or sporting events. . Carefully consider travel plans you have or are making. . If you are planning any travel outside or inside the [place], visit the CDC's Travelers' Health webpage for the latest health notices. . If you have some symptoms but not all symptoms, continue to monitor at home and seek medical attention if your symptoms worsen. . If you are having a medical emergency, call 911.  HOME CARE . Only take medications as instructed by your medical team. . Drink plenty of fluids and get plenty of rest. . A steam or ultrasonic humidifier can help if you have congestion.   GET HELP RIGHT AWAY IF YOU HAVE EMERGENCY WARNING SIGNS** FOR COVID-19. If you or someone is showing any of these signs seek emergency medical care immediately. Call 911 or proceed to your closest emergency facility if: . You develop worsening high fever. . Trouble breathing . Bluish lips or face . Persistent pain or pressure in the chest . New confusion . Inability to wake or stay awake . You cough up blood. . Your symptoms become more severe  **This list is not all possible symptoms. Contact your medical provider for any symptoms that are sever or concerning to you.  MAKE SURE YOU   Understand these instructions.  Will watch your condition.  Will get help right away if you are not doing well or get worse.  Your e-visit answers were reviewed by a board certified  advanced clinical practitioner to complete your personal care plan.  Depending on the condition, your plan could have included both over the counter or prescription medications.  If there is a problem please reply once you have received a response from your provider.  Your safety is important to us.  If you have drug allergies check your prescription carefully.    You can use MyChart to ask questions about today's visit, request a non-urgent call back, or ask for a work or school excuse for 24 hours related to this e-Visit. If it has been greater than 24 hours you will need to follow up with your provider, or enter a new e-Visit to address those concerns. You will get an e-mail in the next two days asking about your experience.  I hope that your e-visit has been valuable and will speed your recovery. Thank you for using e-visits.  Approximately 5 minutes was spent documenting and reviewing patient's chart.    

## 2020-04-17 ENCOUNTER — Other Ambulatory Visit: Payer: Self-pay | Admitting: Radiation Therapy

## 2020-05-02 ENCOUNTER — Other Ambulatory Visit: Payer: Self-pay

## 2020-05-02 ENCOUNTER — Ambulatory Visit (HOSPITAL_COMMUNITY)
Admission: RE | Admit: 2020-05-02 | Discharge: 2020-05-02 | Disposition: A | Discharge status: Home / Self Care | Payer: Medicaid Other | Source: Ambulatory Visit | Attending: Internal Medicine | Admitting: Internal Medicine

## 2020-05-02 DIAGNOSIS — D420 Neoplasm of uncertain behavior of cerebral meninges: Secondary | ICD-10-CM | POA: Diagnosis present

## 2020-05-02 MED ORDER — GADOBUTROL 1 MMOL/ML IV SOLN
7.0000 mL | Freq: Once | INTRAVENOUS | Status: AC | PRN
  Administered 2020-05-02: 7 mL via INTRAVENOUS

## 2020-05-05 ENCOUNTER — Inpatient Hospital Stay: Payer: Medicaid Other | Attending: Internal Medicine

## 2020-05-05 DIAGNOSIS — D420 Neoplasm of uncertain behavior of cerebral meninges: Secondary | ICD-10-CM | POA: Insufficient documentation

## 2020-05-05 DIAGNOSIS — Z923 Personal history of irradiation: Secondary | ICD-10-CM | POA: Insufficient documentation

## 2020-05-05 DIAGNOSIS — Z87891 Personal history of nicotine dependence: Secondary | ICD-10-CM | POA: Insufficient documentation

## 2020-05-05 DIAGNOSIS — F419 Anxiety disorder, unspecified: Secondary | ICD-10-CM | POA: Insufficient documentation

## 2020-05-05 DIAGNOSIS — F329 Major depressive disorder, single episode, unspecified: Secondary | ICD-10-CM | POA: Insufficient documentation

## 2020-05-05 DIAGNOSIS — Z7952 Long term (current) use of systemic steroids: Secondary | ICD-10-CM | POA: Insufficient documentation

## 2020-05-05 DIAGNOSIS — Z79899 Other long term (current) drug therapy: Secondary | ICD-10-CM | POA: Insufficient documentation

## 2020-05-05 DIAGNOSIS — D320 Benign neoplasm of cerebral meninges: Secondary | ICD-10-CM | POA: Insufficient documentation

## 2020-05-06 ENCOUNTER — Other Ambulatory Visit: Payer: Self-pay

## 2020-05-06 ENCOUNTER — Inpatient Hospital Stay (HOSPITAL_BASED_OUTPATIENT_CLINIC_OR_DEPARTMENT_OTHER): Payer: Medicaid Other | Admitting: Internal Medicine

## 2020-05-06 VITALS — BP 132/75 | HR 76 | Temp 98.1°F | Resp 16 | Wt 157.2 lb

## 2020-05-06 DIAGNOSIS — Z79899 Other long term (current) drug therapy: Secondary | ICD-10-CM | POA: Diagnosis not present

## 2020-05-06 DIAGNOSIS — F329 Major depressive disorder, single episode, unspecified: Secondary | ICD-10-CM | POA: Diagnosis not present

## 2020-05-06 DIAGNOSIS — Z923 Personal history of irradiation: Secondary | ICD-10-CM | POA: Diagnosis not present

## 2020-05-06 DIAGNOSIS — R519 Headache, unspecified: Secondary | ICD-10-CM

## 2020-05-06 DIAGNOSIS — D320 Benign neoplasm of cerebral meninges: Secondary | ICD-10-CM | POA: Diagnosis not present

## 2020-05-06 DIAGNOSIS — D420 Neoplasm of uncertain behavior of cerebral meninges: Secondary | ICD-10-CM

## 2020-05-06 DIAGNOSIS — Z87891 Personal history of nicotine dependence: Secondary | ICD-10-CM | POA: Diagnosis not present

## 2020-05-06 DIAGNOSIS — Z7952 Long term (current) use of systemic steroids: Secondary | ICD-10-CM | POA: Diagnosis not present

## 2020-05-06 DIAGNOSIS — F419 Anxiety disorder, unspecified: Secondary | ICD-10-CM | POA: Diagnosis not present

## 2020-05-06 MED ORDER — LIDOCAINE 0.5 % EX GEL: 2.0000 g | CUTANEOUS | 2 refills | Status: DC | PRN

## 2020-05-06 NOTE — Progress Notes (Signed)
[facility] [address], [place] [address] [phone]   Interval Evaluation  Date of Service: 05/06/20 Patient Name: Lori Mcmillan Patient MRN: [id number] Patient DOB: 31-Jan-1961 Provider: Ventura Sellers, MD  Identifying Statement:  Lori Mcmillan is a 59 y.o. 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 Isidore Moos  Interval History:  Lori Mcmillan presents today for follow up after recent MRI brain.  She describes no new or progressive neurologic deficits.  Does describe episodes of scalp pain and neuropathy at the surgical site, at times severe. Double vision remains as prior.  Otherwise no frank headaches.  Prior (05/03/18): Completed radiation at the end of March.  She has MRI brain for review today.  She describes no new neurologic deficits.  Does have frequent moderate headaches, has been taking Tylenol daily for the past 3 months.  Continues to have double vision which limits her ability to drive, read, perform some ADLs.  She has noted history of grade I breast cancer treated with radiation several years ago.  Medications: Current Outpatient Medications on File Prior to Visit  Medication Sig Dispense Refill  . acetaminophen (TYLENOL) 325 MG tablet Take by mouth.    Marland Kitchen amitriptyline (ELAVIL) 50 MG tablet Take 1 tablet (50 mg total) by mouth at bedtime. 30 tablet 3  . dexamethasone (DECADRON) 4 MG tablet Take 1 tablet (4 mg total) by mouth daily. (Patient not taking: Reported on 11/06/2019) 5 tablet 0  . diphenhydrAMINE (BENADRYL) 25 MG tablet Take 25 mg by mouth every 6 (six) hours as needed.    Marland Kitchen ibuprofen (ADVIL) 200 MG tablet Take 200 mg by mouth every 6 (six) hours as needed.    . meclizine (ANTIVERT) 25 MG  tablet Take 25 mg by mouth 3 (three) times daily as needed for dizziness.    . ondansetron (ZOFRAN) 8 MG tablet Take 1 tablet (8 mg total) by mouth every 8 (eight) hours as needed for nausea or vomiting. (Patient not taking: Reported on 02/24/2018) 30 tablet 3  . promethazine (PHENERGAN) 25 MG tablet Take 1 tablet (25 mg total) by mouth every 6 (six) hours as needed for nausea or vomiting. (Patient not taking: Reported on 02/24/2018) 30 tablet 3   No current facility-administered medications on file prior to visit.    Allergies: No Known Allergies Past Medical History:  Past Medical History:  Diagnosis Date  . Anxiety    occ lorazepam  . Atypical meningioma of brain ([facility]) 2018/2019   Resected, then RT Feb/Mar 2019.  No sign of residual dz at 04/2018 rad onc f/u and 10/2018 neuro-onc f/u. 03/2019 MRI brain->no resid/no recurrence.  . 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  Marland Kitchen Depression    zoloft in past--?wt gain.  . Diplopia    chronic (meningioma-related)  . H/O mammogram 11/21/12   B/L   . History of radiation therapy 02/24/12-04/11/12   right breast/ 45Gy@1 .8Gyx20fx/boost=16Gy@2  Gya51fx.  Latest mammo and u/s 03/2013--normal.  . History of radiation therapy 11/30/17- 01/11/18   Right temporal lobe treated to 55.8 Gy with 31 fx of 1.8 Gy  . Palpitations    has taken Metoprolol for palpitations in the  past.  . Taste impairment    s/p radiation therapy  . Tobacco dependence 09/05/2013   Past Surgical History:  Past Surgical History:  Procedure Laterality Date  . APPENDECTOMY  2008   emergency  . BRAIN TUMOR EXCISION  2018   Meningioma  . BREAST LUMPECTOMY  01/04/12   right breast  . CRANIECTOMY  10/19/2017   at [facility]   Social History:  Social History   Socioeconomic History  . Marital status: Single    Spouse name: Not on file  . Number of  children: Not on file  . Years of education: Not on file  . Highest education level: Not on file  Occupational History  . Not on file  Tobacco Use  . Smoking status: Former Smoker    Packs/day: 0.50    Types: Cigarettes    Quit date: 10/17/2017    Years since quitting: 2.5  . Smokeless tobacco: Never Used  Substance and Sexual Activity  . Alcohol use: Yes    Alcohol/week: 5.0 standard drinks    Types: 5 Cans of beer per week  . Drug use: No  . Sexual activity: Not Currently  Other Topics Concern  . Not on file  Social History Narrative   Single, no children.   Has a cat and a dog.   Orig from [place] in [place].   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   Tob: 20 pack-yr hx, current as of 09/05/13.   Alc: beer 1-2 per day avg, whisky with water 1 glass hs.   High protein/low carb diet.   Walks 3 x/week with dog.  Some biking.   Social Determinants of Health   Financial Resource Strain:   . Difficulty of Paying Living Expenses:   Food Insecurity:   . Worried About Charity fundraiser in the Last Year:   . Arboriculturist in the Last Year:   Transportation Needs:   . Film/video editor (Medical):   Marland Kitchen Lack of Transportation (Non-Medical):   Physical Activity:   . Days of Exercise per Week:   . Minutes of Exercise per Session:   Stress:   . Feeling of Stress :   Social Connections:   . Frequency of Communication with Friends and Family:   . Frequency of Social Gatherings with Friends and Family:   . Attends Religious Services:   . Active Member of Clubs or Organizations:   . Attends Archivist Meetings:   Marland Kitchen Marital Status:   Intimate Partner Violence:   . Fear of Current or Ex-Partner:   . Emotionally Abused:   Marland Kitchen Physically Abused:   . Sexually Abused:    Family History:  Family History  Problem Relation Age of Onset  . Anesthesia problems Mother   . Cancer Mother        breast  . Cancer Maternal Aunt        breast  . Cancer Paternal Grandfather         colon    Review of Systems: Constitutional: Denies fevers, chills or abnormal weight loss Eyes: Double vision Ears, nose, mouth, throat, and face: Denies mucositis or sore throat Respiratory: Denies cough, dyspnea or wheezes Cardiovascular: Denies palpitation, chest discomfort or lower extremity swelling Gastrointestinal:  Denies nausea, constipation, diarrhea GU: Denies dysuria or incontinence Skin: Denies abnormal skin rashes Neurological: Per HPI Musculoskeletal: Denies joint pain, back or neck discomfort. No decrease in ROM Behavioral/Psych: +anxiety  Physical Exam: Vitals:   05/06/20 1158  BP: 132/75  Pulse: 76  Resp: 16  Temp: 98.1 F (36.7 C)  SpO2: 99%   KPS: 90. General: Alert, cooperative, pleasant, in no acute distress Head: Craniotomy scar noted, dry and intact. EENT: No conjunctival injection or scleral icterus. Oral mucosa moist Lungs: Resp effort normal Cardiac: Regular rate and rhythm Abdomen: Soft, non-distended abdomen Skin: No rashes cyanosis or petechiae. Extremities: No clubbing or edema  Neurologic Exam: Mental Status: Awake, alert, attentive to examiner. Oriented to self and environment. Language is fluent with intact comprehension. Age advanced pyschomotor slowing with 2/3 object recall at 5 minutes. Cranial Nerves: Visual acuity is grossly normal. Visual fields are full. Extra-ocular movements intact with horizontal diplopia on left gaze. No ptosis. Face is symmetric, tongue midline.  Some sensory loss right>left face Motor: Tone and bulk are normal. Power is full in both arms and legs. Reflexes are symmetric, no pathologic reflexes present. Intact finger to nose bilaterally Sensory: Intact to light touch and temperature Gait: Normal and tandem gait is normal.   Labs: I have reviewed the data as listed    Component Value Date/Time   NA 136 09/18/2013 0900   K 4.0 09/18/2013 0900   CL 103 09/18/2013 0900   CO2 26 09/18/2013 0900    GLUCOSE 82 09/18/2013 0900   BUN 11 09/18/2013 0900   CREATININE 0.64 09/18/2013 0900   CALCIUM 9.0 09/18/2013 0900   PROT 6.6 09/18/2013 0900   ALBUMIN 4.1 09/18/2013 0900   AST 13 09/18/2013 0900   ALT 10 09/18/2013 0900   ALKPHOS 68 09/18/2013 0900   BILITOT 0.6 09/18/2013 0900   GFRNONAA >90 05/30/2013 1535   GFRAA >90 05/30/2013 1535   Lab Results  Component Value Date   WBC 7.5 09/18/2013   NEUTROABS 4.8 09/18/2013   HGB 13.0 09/18/2013   HCT 39.6 09/18/2013   MCV 89.6 09/18/2013   PLT 325 09/18/2013    Imaging:  [facility] Clinician Interpretation: I have personally reviewed the CNS images as listed.  My interpretation, in the context of the patient's clinical presentation, is stable disease  MR Brain W Wo Contrast  Result Date: 05/03/2020 CLINICAL DATA:  History of resection of right middle cranial fossa meningioma. Also history of breast cancer. EXAM: MRI HEAD WITHOUT AND WITH CONTRAST TECHNIQUE: Multiplanar, multiecho pulse sequences of the brain and surrounding structures were obtained without and with intravenous contrast. CONTRAST:  85mL GADAVIST GADOBUTROL 1 MMOL/ML IV SOLN COMPARISON:  MRI head 10/31/2019 FINDINGS: Brain: Stable post resection of right posterior temporal meningioma. Moderately large area of encephalomalacia in the right posterior temporal lobe is stable. FLAIR hyperintensity surrounding the surgical cavity is stable. No recurrent enhancing mass lesion. Extra-axial fluid collection in the right temporal lobe is stable. Ventricle size normal. No midline shift. No acute infarct. Negative for metastatic disease Vascular: Normal arterial flow voids Skull and upper cervical spine: Right temporal craniotomy. Sinuses/Orbits: Paranasal sinuses clear.  Negative orbit. Right mastoid effusion with interval improvement. Other: None IMPRESSION: Stable MRI post resection of right posterior temporal meningioma. No recurrent tumor. Electronically Signed   By: Franchot Gallo M.D.    On: 05/03/2020 10:01     Assessment/Plan Atypical meningioma of brain [facility]) [D42.0]  Ms. Dault is clinically and radiographically stable today.   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  She is not interested in oral medications.  We appreciate the opportunity to participate in the care  of Lori Mcmillan.   We ask that she return to clinic in 12 months following next brain MRI, or sooner as needed.  All questions were answered. The patient knows to call the clinic with any problems, questions or concerns. No barriers to learning were detected.  I have spent a total of 30 minutes of face-to-face and non-face-to-face time, excluding clinical staff time, preparing to see patient, ordering tests and/or medications, counseling the patient, and independently interpreting results and communicating results to the patient/family/caregiver    Ventura Sellers, MD Medical Director of Neuro-Oncology [facility] [place] 05/06/20 11:55 AM

## 2020-05-07 ENCOUNTER — Telehealth: Payer: Self-pay | Admitting: Internal Medicine

## 2020-05-07 NOTE — Telephone Encounter (Signed)
Scheduled per 7/20 los. Pt is aware of appt time and date.

## 2020-06-23 IMAGING — MR MRI HEAD WITHOUT AND WITH CONTRAST
8 of 13 series · 33 of 48 positions shown · IV contrast (Yes)
Comparison: 10/30/2018.

CLINICAL DATA: Dizziness, pain and pressure. Breast cancer.
Meningioma.

EXAM:
MRI HEAD WITHOUT AND WITH CONTRAST
TECHNIQUE: Multiplanar, multiecho pulse sequences of the brain and surrounding
structures were obtained without and with intravenous contrast.
CONTRAST:  Gadavist 6 mL.

[Series 3: DWI · axial · 3.0mm · 1.09mm/px · z∈[-61,+72]mm · 7 of 94 slices shown (1 of 4)]
[im 1/94]
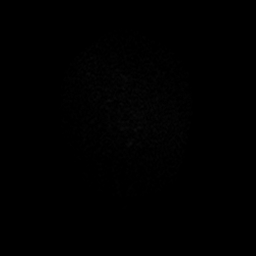
[im 16/94]
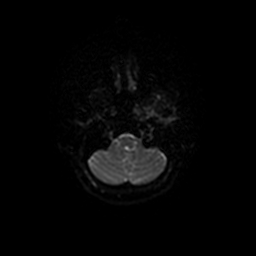
[im 32/94]
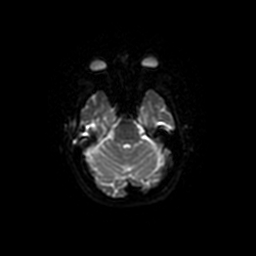
[im 47/94]
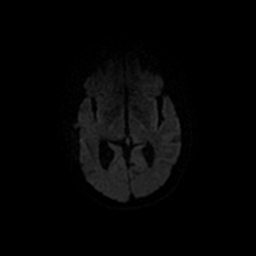
[im 63/94]
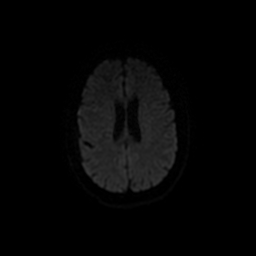
[im 78/94]
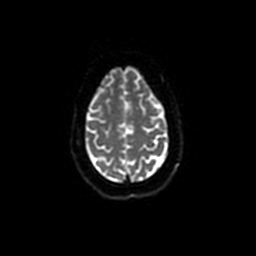
[im 94/94]
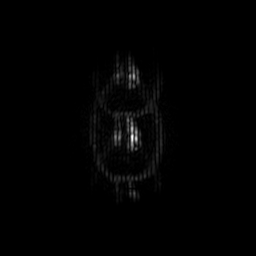

[Series 6: FLAIR · axial · 3.0mm · 0.43mm/px · z∈[-65,+79]mm · 2 of 26 slices shown]
[im 1/26]
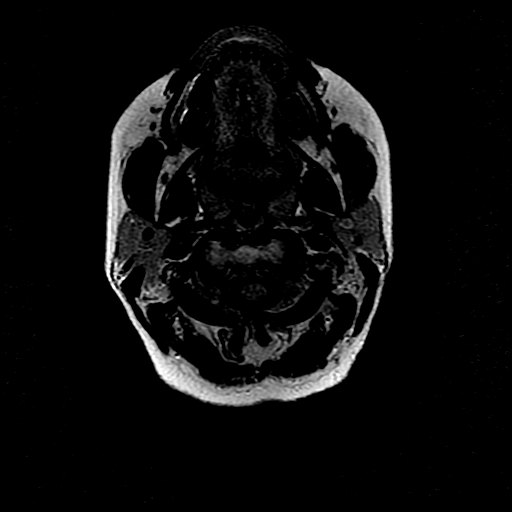
[im 26/26]
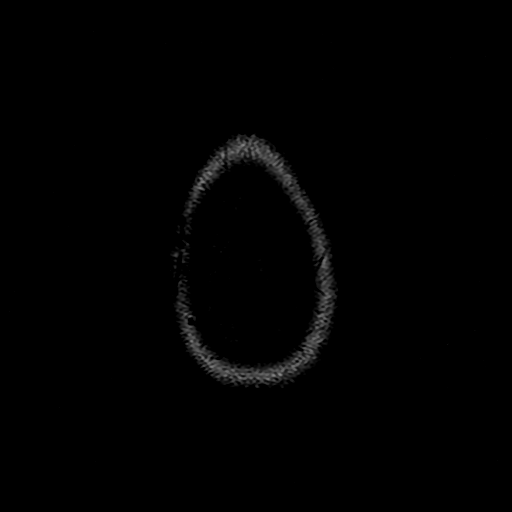

[Series 9: DWI · coronal · 4.0mm · 1.09mm/px · 6 of 78 slices shown (2 of 4)]
[im 1/78]
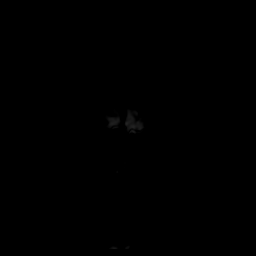
[im 16/78]
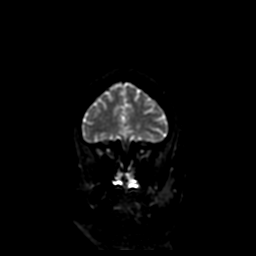
[im 31/78]
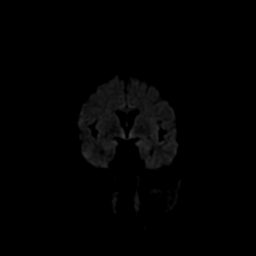
[im 47/78]
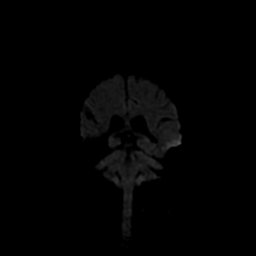
[im 62/78]
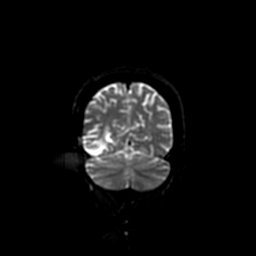
[im 78/78]
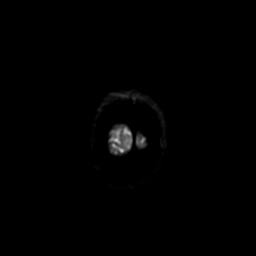

[Series 11: T1 post-contrast · axial · 1.6mm · 0.47mm/px · z∈[-60,+77]mm · 7 of 90 slices shown (1 of 3)]
[im 1/90]
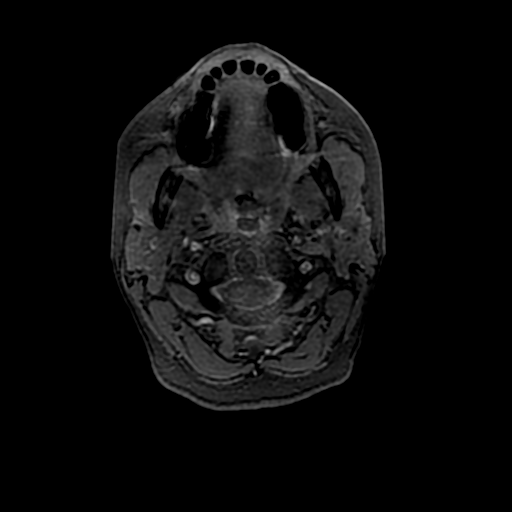
[im 15/90]
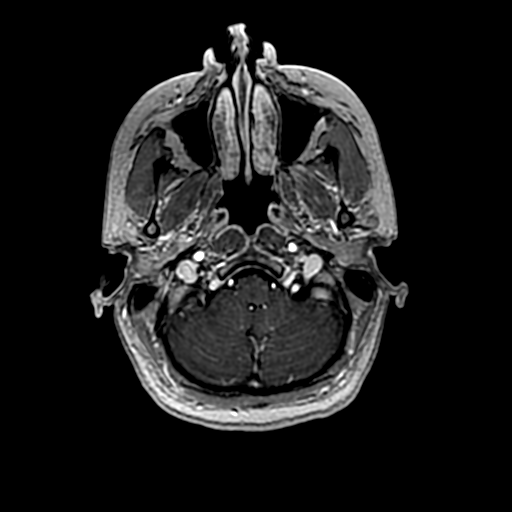
[im 30/90]
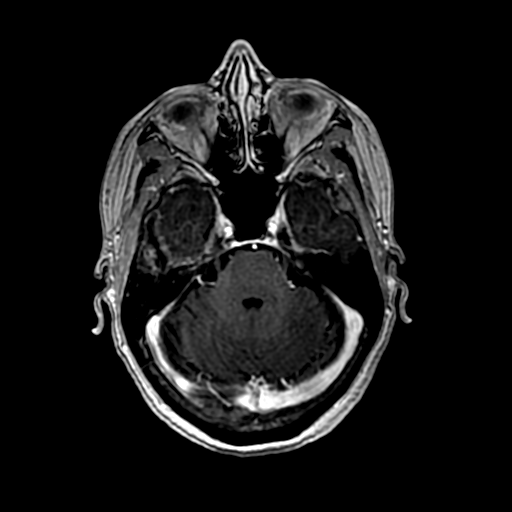
[im 45/90]
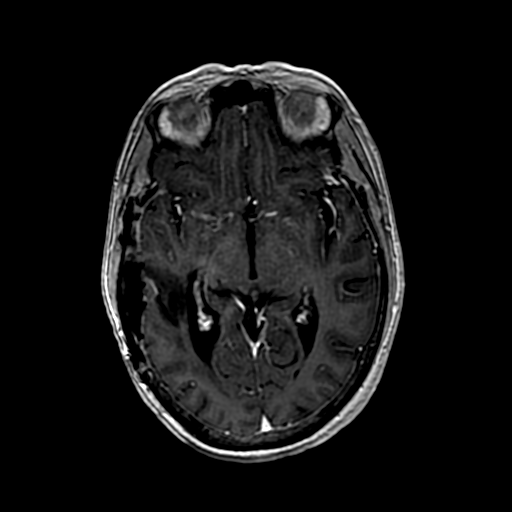
[im 60/90]
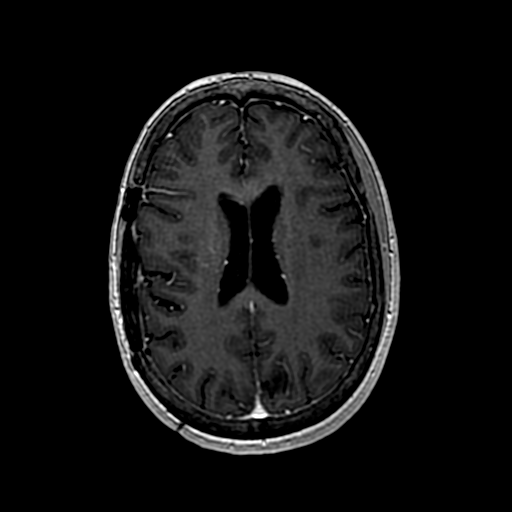
[im 75/90]
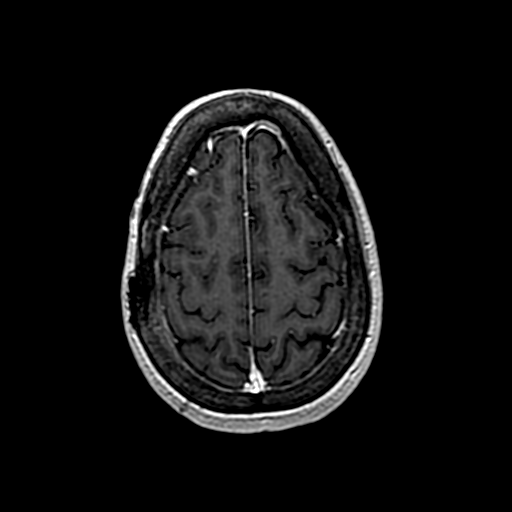
[im 90/90]
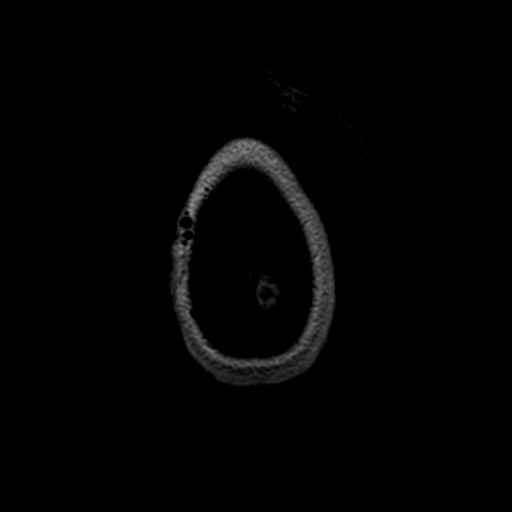

[Series 12: T1 post-contrast · coronal · 5.0mm · 0.47mm/px · 2 of 24 slices shown (2 of 3)]
[im 1/24]
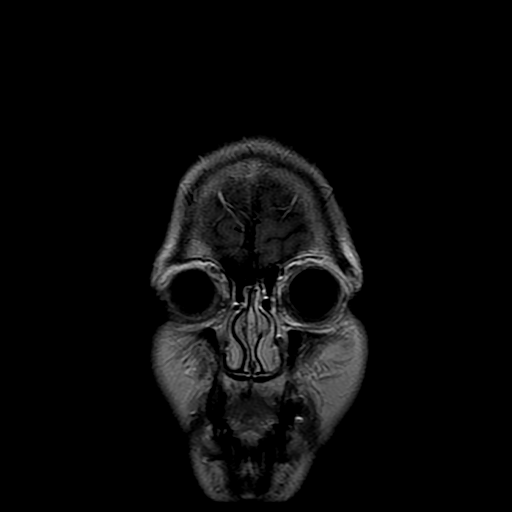
[im 24/24]
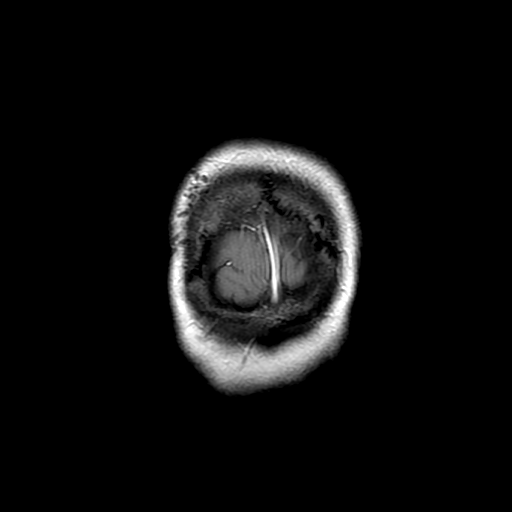

[Series 13: T1 post-contrast · sagittal · 5.0mm · 0.47mm/px · 2 of 20 slices shown (3 of 3)]
[im 1/20]
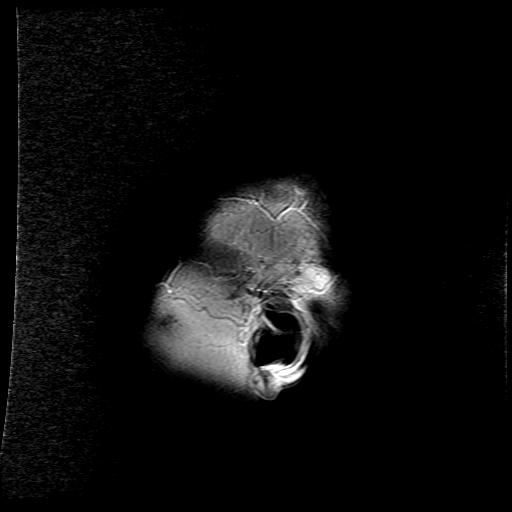
[im 20/20]
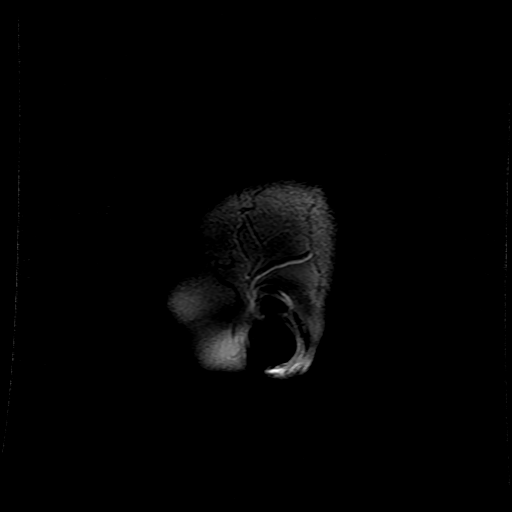

[Series 301: DWI · axial · 3.0mm · 1.09mm/px · z∈[-61,+72]mm · 4 of 47 slices shown (3 of 4)]
[im 1/47]
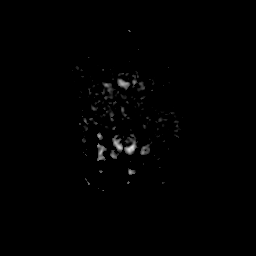
[im 16/47]
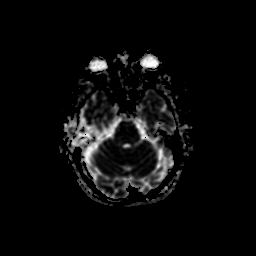
[im 31/47]
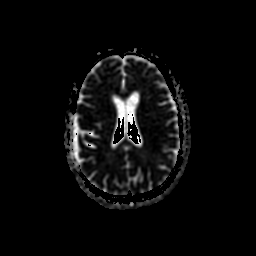
[im 47/47]
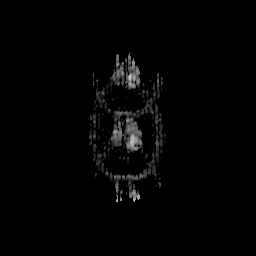

[Series 900: DWI · coronal · 4.0mm · 1.09mm/px · 3 of 39 slices shown (4 of 4)]
[im 1/39]
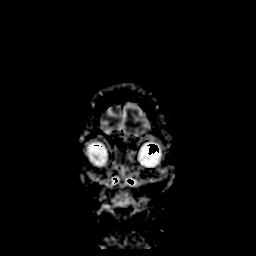
[im 20/39]
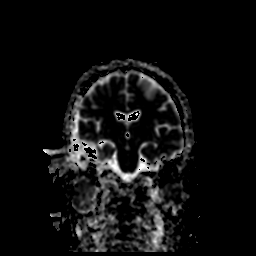
[im 39/39]
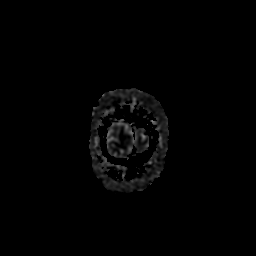

[33 of 48 positions shown; findings below may reference images not displayed]

FINDINGS: Brain: No acute infarction, hemorrhage, hydrocephalus, or mass
lesion. Chronic changes of RIGHT temporal encephalomalacia, gliosis,
and blood products related to an old meningioma resection. Post
infusion, no abnormal enhancement of the brain or meninges aside
from postsurgical change related to the craniotomy/cranioplasty.
Previously identified enhancement along the superior margin of the
resection cavity continues to show involution, now minimal. No
midline shift. No vasogenic edema. No recurrent tumor or new lesions
elsewhere.Chronic epidural seroma.

Elsewhere, mild small vessel disease. Incidental developmental
venous anomaly RIGHT basal ganglia.

Vascular: Normal flow voids.

Skull and upper cervical spine: Normal marrow signal. Unremarkable
craniotomy.

Sinuses/Orbits: Negative.

Other: RIGHT mastoid effusion continues to involute.
IMPRESSION: Satisfactory postoperative appearance. No evidence of recurrent
tumor, or acute intracranial abnormality. Chronic changes related to
RIGHT temporal meningioma resection.

## 2020-09-24 ENCOUNTER — Other Ambulatory Visit (HOSPITAL_COMMUNITY): Payer: Self-pay | Admitting: Otolaryngology

## 2020-09-24 DIAGNOSIS — R42 Dizziness and giddiness: Secondary | ICD-10-CM

## 2020-09-24 DIAGNOSIS — D329 Benign neoplasm of meninges, unspecified: Secondary | ICD-10-CM

## 2020-09-30 ENCOUNTER — Telehealth: Payer: Self-pay | Admitting: *Deleted

## 2020-09-30 ENCOUNTER — Other Ambulatory Visit: Payer: Self-pay | Admitting: Radiation Therapy

## 2020-09-30 NOTE — Telephone Encounter (Signed)
Patient called with sudden onset of vision change in left eye.  She describes it as seeing multiple thousands of the same image in the left eye.  She is concerned because this is the same symptom she had when she was first diagnosed.  ENT Dr. Lind Guest has ordered a MRI for 12/20.    Per Dr. Mickeal Skinner move up f/u after MRI to discuss results.

## 2020-10-06 ENCOUNTER — Telehealth: Payer: Self-pay | Admitting: Internal Medicine

## 2020-10-06 ENCOUNTER — Encounter (HOSPITAL_COMMUNITY): Payer: Self-pay

## 2020-10-06 ENCOUNTER — Ambulatory Visit (HOSPITAL_COMMUNITY): Payer: Medicaid Other

## 2020-10-06 NOTE — Telephone Encounter (Signed)
Received call from patient today to reschedule OV with Dr. Mickeal Skinner for after patient's MRI. Rescheduled appointment accordingly and patient is aware of updated time and date.

## 2020-10-09 ENCOUNTER — Inpatient Hospital Stay: Payer: Medicaid Other | Admitting: Internal Medicine

## 2020-10-20 ENCOUNTER — Encounter (HOSPITAL_COMMUNITY): Payer: Self-pay

## 2020-10-20 ENCOUNTER — Telehealth: Payer: Self-pay | Admitting: Internal Medicine

## 2020-10-20 ENCOUNTER — Ambulatory Visit (HOSPITAL_COMMUNITY): Payer: Medicaid Other

## 2020-10-20 NOTE — Telephone Encounter (Signed)
Rescheduled appointment per 1/3 schedule message. Patient is aware of changes. 

## 2020-10-23 ENCOUNTER — Inpatient Hospital Stay: Payer: Medicaid Other | Admitting: Internal Medicine

## 2020-10-28 ENCOUNTER — Ambulatory Visit (HOSPITAL_COMMUNITY)
Admission: RE | Admit: 2020-10-28 | Discharge: 2020-10-28 | Disposition: A | Discharge status: Home / Self Care | Payer: Medicare Other | Source: Ambulatory Visit | Attending: Otolaryngology | Admitting: Otolaryngology

## 2020-10-28 ENCOUNTER — Other Ambulatory Visit: Payer: Self-pay

## 2020-10-28 ENCOUNTER — Ambulatory Visit (HOSPITAL_COMMUNITY): Payer: Medicaid Other

## 2020-10-28 DIAGNOSIS — R42 Dizziness and giddiness: Secondary | ICD-10-CM | POA: Insufficient documentation

## 2020-10-28 DIAGNOSIS — D329 Benign neoplasm of meninges, unspecified: Secondary | ICD-10-CM | POA: Diagnosis present

## 2020-10-28 MED ORDER — GADOBUTROL 1 MMOL/ML IV SOLN
7.0000 mL | Freq: Once | INTRAVENOUS | Status: AC | PRN
  Administered 2020-10-28: 7 mL via INTRAVENOUS

## 2020-10-31 ENCOUNTER — Other Ambulatory Visit: Payer: Self-pay

## 2020-10-31 ENCOUNTER — Inpatient Hospital Stay: Payer: Medicare Other | Attending: Internal Medicine | Admitting: Internal Medicine

## 2020-10-31 VITALS — BP 143/82 | HR 79 | Temp 97.5°F | Resp 18 | Ht 64.0 in | Wt 161.6 lb

## 2020-10-31 DIAGNOSIS — D420 Neoplasm of uncertain behavior of cerebral meninges: Secondary | ICD-10-CM | POA: Insufficient documentation

## 2020-10-31 DIAGNOSIS — R55 Syncope and collapse: Secondary | ICD-10-CM | POA: Insufficient documentation

## 2020-10-31 DIAGNOSIS — Z87891 Personal history of nicotine dependence: Secondary | ICD-10-CM | POA: Insufficient documentation

## 2020-10-31 DIAGNOSIS — H532 Diplopia: Secondary | ICD-10-CM | POA: Diagnosis not present

## 2020-10-31 DIAGNOSIS — Z853 Personal history of malignant neoplasm of breast: Secondary | ICD-10-CM | POA: Insufficient documentation

## 2020-10-31 DIAGNOSIS — Z923 Personal history of irradiation: Secondary | ICD-10-CM | POA: Insufficient documentation

## 2020-10-31 DIAGNOSIS — R519 Headache, unspecified: Secondary | ICD-10-CM | POA: Diagnosis not present

## 2020-10-31 MED ORDER — LEVETIRACETAM 500 MG PO TABS: 500.0000 mg | ORAL_TABLET | Freq: Two times a day (BID) | ORAL | 3 refills | Status: DC

## 2020-10-31 NOTE — Progress Notes (Signed)
[facility] [place], Lori Mcmillan [id number] [id number]   Interval Evaluation  Date of Service: 10/31/20 Patient Name: Lori Mcmillan Patient MRN: [id number] Patient DOB: 20-Apr-1961 Provider: Ventura Sellers, MD  Identifying Statement:  Lori Mcmillan is a 60 y.o. 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 Isidore Moos  Interval History:  Lori Mcmillan presents today for follow up after recent MRI brain.  She describes 4 episodes on the past 2 months of "found down".  She describes finding herself on the floor or ground, in two instances with mild soft tissue injuries, with no clear understanding of how she ended up there.  Denies any prodrome of lightheaded feeling aura/warning prior to the falls.  She does not have any recollection of tripping.  There is little to no confusion upon awakening, she feels essentially at baseline.  Otherwise, no new focal symptoms. Double vision remains as prior.  Otherwise no frank headaches.  Prior (05/03/18): Completed radiation at the end of March.  She has MRI brain for review today.  She describes no new neurologic deficits.  Does have frequent moderate headaches, has been taking Tylenol daily for the past 3 months.  Continues to have double vision which limits her ability to drive, read, perform some ADLs.  She has noted history of grade I breast cancer treated with radiation several years ago.  Medications: Current Outpatient Medications on File Prior to Visit  Medication Sig Dispense Refill  . diphenhydrAMINE (BENADRYL) 25 MG tablet Take 25 mg by mouth every 6 (six) hours as needed. (Patient not taking: Reported on 05/06/2020)    . ibuprofen (ADVIL) 200 MG tablet Take 200 mg by mouth  every 6 (six) hours as needed. (Patient not taking: Reported on 05/06/2020)    . Lidocaine 0.5 % GEL Apply 2 g topically as needed. 170 g 2  . meclizine (ANTIVERT) 25 MG tablet Take 25 mg by mouth 3 (three) times daily as needed for dizziness. (Patient not taking: Reported on 05/06/2020)     No current facility-administered medications on file prior to visit.    Allergies: No Known Allergies Past Medical History:  Past Medical History:  Diagnosis Date  . Anxiety    occ lorazepam  . 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  No sign of residual dz at 04/2018 rad onc f/u and 10/2018 neuro-onc f/u. 03/2019 MRI brain->no resid/no recurrence.  . 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  Marland Kitchen Depression    zoloft in past--?wt gain.  . Diplopia    chronic (meningioma-related)  . H/O mammogram 11/21/12   B/L   . History of radiation therapy 02/24/12-04/11/12   right breast/ 45Gy@1 .8Gyx73fx/boost=16Gy@2  Gya37fx.  Latest mammo and u/s 03/2013--normal.  . History of radiation therapy 11/30/17- 01/11/18   Right temporal lobe treated to 55.8 Gy with 31 fx of 1.8 Gy  . Palpitations    has taken Metoprolol for palpitations in the past.  . Taste impairment    s/p radiation therapy  . Tobacco dependence 09/05/2013   Past Surgical History:  Past Surgical History:  Procedure Laterality Date  . APPENDECTOMY  2008  emergency  . BRAIN TUMOR EXCISION  2018   Meningioma  . BREAST LUMPECTOMY  01/04/12   right breast  . CRANIECTOMY  10/19/2017   at [facility]   Social History:  Social History   Socioeconomic History  . Marital status: Single    Spouse name: Not on file  . Number of children: Not on file  . Years of education: Not on file  . Highest education level: Not on file  Occupational History  . Not on file  Tobacco Use  .  Smoking status: Former Smoker    Packs/day: 0.50    Types: Cigarettes    Quit date: 10/17/2017    Years since quitting: 3.0  . Smokeless tobacco: Never Used  Substance and Sexual Activity  . Alcohol use: Yes    Alcohol/week: 5.0 standard drinks    Types: 5 Cans of beer per week  . Drug use: No  . Sexual activity: Not Currently  Other Topics Concern  . Not on file  Social History Narrative   Single, no children.   Has a cat and a dog.   Orig from [place] in [place].   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   Tob: 20 pack-yr hx, current as of 09/05/13.   Alc: beer 1-2 per day avg, whisky with water 1 glass hs.   High protein/low carb diet.   Walks 3 x/week with dog.  Some biking.   Social Determinants of Health   Financial Resource Strain: Not on file  Food Insecurity: Not on file  Transportation Needs: Not on file  Physical Activity: Not on file  Stress: Not on file  Social Connections: Not on file  Intimate Partner Violence: Not on file   Family History:  Family History  Problem Relation Age of Onset  . Anesthesia problems Mother   . Cancer Mother        breast  . Cancer Maternal Aunt        breast  . Cancer Paternal Grandfather        colon    Review of Systems: Constitutional: Denies fevers, chills or abnormal weight loss Eyes: Double vision Ears, nose, mouth, throat, and face: Denies mucositis or sore throat Respiratory: Denies cough, dyspnea or wheezes Cardiovascular: Denies palpitation, chest discomfort or lower extremity swelling Gastrointestinal:  Denies nausea, constipation, diarrhea GU: Denies dysuria or incontinence Skin: Denies abnormal skin rashes Neurological: Per HPI Musculoskeletal: Denies joint pain, back or neck discomfort. No decrease in ROM Behavioral/Psych: +anxiety  Physical Exam: Vitals:   10/31/20 1128  BP: (!) 143/82  Pulse: 79  Resp: 18  Temp: (!) 97.5 F (36.4 C)  SpO2: 98%   KPS: 90. General: Alert, cooperative, pleasant,  in no acute distress Head: Craniotomy scar noted, dry and intact. EENT: No conjunctival injection or scleral icterus. Oral mucosa moist Lungs: Resp effort normal Cardiac: Regular rate and rhythm Abdomen: Soft, non-distended abdomen Skin: No rashes cyanosis or petechiae. Extremities: No clubbing or edema  Neurologic Exam: Mental Status: Awake, alert, attentive to examiner. Oriented to self and environment. Language is fluent with intact comprehension. Age advanced pyschomotor slowing with 2/3 object recall at 5 minutes. Cranial Nerves: Visual acuity is grossly normal. Visual fields are full. Extra-ocular movements intact with horizontal diplopia on left gaze. No ptosis. Face is symmetric, tongue midline.  Some sensory loss right>left face Motor: Tone and bulk are normal. Power is full in both arms and legs. Reflexes are symmetric,  no pathologic reflexes present. Intact finger to nose bilaterally Sensory: Intact to light touch and temperature Gait: Normal and tandem gait is normal.   Labs: I have reviewed the data as listed    Component Value Date/Time   NA 136 09/18/2013 0900   K 4.0 09/18/2013 0900   CL 103 09/18/2013 0900   CO2 26 09/18/2013 0900   GLUCOSE 82 09/18/2013 0900   BUN 11 09/18/2013 0900   CREATININE 0.64 09/18/2013 0900   CALCIUM 9.0 09/18/2013 0900   PROT 6.6 09/18/2013 0900   ALBUMIN 4.1 09/18/2013 0900   AST 13 09/18/2013 0900   ALT 10 09/18/2013 0900   ALKPHOS 68 09/18/2013 0900   BILITOT 0.6 09/18/2013 0900   GFRNONAA >90 05/30/2013 1535   GFRAA >90 05/30/2013 1535   Lab Results  Component Value Date   WBC 7.5 09/18/2013   NEUTROABS 4.8 09/18/2013   HGB 13.0 09/18/2013   HCT 39.6 09/18/2013   MCV 89.6 09/18/2013   PLT 325 09/18/2013    Imaging:  [facility] Clinician Interpretation: I have personally reviewed the CNS images as listed.  My interpretation, in the context of the patient's clinical presentation, is stable disease  MR BRAIN W WO  CONTRAST  Result Date: 10/28/2020 CLINICAL DATA:  Vertigo.  Meningioma. EXAM: MRI HEAD WITHOUT AND WITH CONTRAST TECHNIQUE: Multiplanar, multiecho pulse sequences of the brain and surrounding structures were obtained without and with intravenous contrast. CONTRAST:  75mL GADAVIST GADOBUTROL 1 MMOL/ML IV SOLN COMPARISON:  MRI of the brain May 02, 2020. FINDINGS: Brain: No acute infarction, hemorrhage, hydrocephalus, extra-axial collection or mass lesion. Stable appearance of postsurgical changes in the right temporal region with large area of encephalomalacia and gliosis in the right temporal lobe. T2 hyperintense signal within the surrounding temporal occipital white matter is also unchanged. Right temporal epidural fluid collection measuring up to 8 mm in thickness appear stable. No focus of abnormal contrast enhancement to suggest residual/recurrent lesion. Vascular: Normal flow voids. Skull and upper cervical spine: Postsurgical changes from right temporoparietal. Craniotomy Sinuses/Orbits: Negative. Other: Right mastoid effusion. IMPRESSION: Stable appearance of postsurgical changes in the right temporal region with large area of encephalomalacia and gliosis in the right temporal lobe. No focus of abnormal contrast enhancement to suggest residual/recurrent lesion. Electronically Signed   By: Pedro Earls M.D.   On: 10/28/2020 16:06     Assessment/Plan Atypical meningioma of brain [facility]) [D42.0]  Ms. Gaschler is radiographically stable today.  Clinical syndrome discussed, however, is concerning for seizures.  The lack of prodrome, period of loss of awareness, and of course brain injury/tumor/resection cavity as a risk factor are important variables.   The lack of post-ictal changes is less fitting with seizures, but doesn't rule it out.  There is not a good clinical story for syncope, either.    We recommended a trial of an anti-epileptic, Keppra 500mg  BID.  Because of frequency of  events, we should be able to tell if this affects the clinical syndrome or not.   Reviewed epilepsy precuations for 'suspected seizures', including driving precautions and restrictions.    We appreciate the opportunity to participate in the care of Lori Mcmillan.   We will touch base with her in 2 months via phone to assess response to this intervention.  All questions were answered. The patient knows to call the clinic with any problems, questions or concerns. No barriers to learning were detected.  I have spent a total of 40 minutes of face-to-face  and non-face-to-face time, excluding clinical staff time, preparing to see patient, ordering tests and/or medications, counseling the patient, and independently interpreting results and communicating results to the patient/family/caregiver    Ventura Sellers, MD Medical Director of Neuro-Oncology [facility] at [place] 10/31/20 10:53 AM

## 2020-11-10 ENCOUNTER — Inpatient Hospital Stay: Payer: Medicare Other

## 2020-12-10 ENCOUNTER — Other Ambulatory Visit: Payer: Self-pay

## 2021-01-01 ENCOUNTER — Inpatient Hospital Stay: Payer: Medicare Other | Attending: Internal Medicine | Admitting: Internal Medicine

## 2021-01-01 DIAGNOSIS — D329 Benign neoplasm of meninges, unspecified: Secondary | ICD-10-CM

## 2021-01-01 DIAGNOSIS — R569 Unspecified convulsions: Secondary | ICD-10-CM | POA: Insufficient documentation

## 2021-01-01 NOTE — Progress Notes (Signed)
I connected with Lori Mcmillan on 01/01/21 at 12:00 PM EDT by telephone visit and verified that I am speaking with the correct person using two identifiers.  I discussed the limitations, risks, security and privacy concerns of performing an evaluation and management service by telemedicine and the availability of in-person appointments. I also discussed with the patient that there may be a patient responsible charge related to this service. The patient expressed understanding and agreed to proceed.  Other persons participating in the visit and their role in the encounter:  n/a  Patient's location:  Home  Provider's location:  Office  Chief Complaint:  Meningioma ([place])  Seizures ([place])  History of Present Ilness: Lori Mcmillan describes resolution of events of altered or loss of consciousness, since starting the Keppra 500mg  twice per day.  She had one episode of vertigo, but this was different semiology and also position related.  She has noticed some mild mood effects (irritability) since starting the Keppra, otherwise has been well tolerated.  Otherwise no new or progressive deficits. Observations: Language and cognition at baseline Assessment and Plan: Meningioma ([place])  Seizures ([place])  Recommend continuing Keppra 500mg  BID for suspected seizure activity.    Reviewed seizure precautions.    Can consider reducing dose at next visit in July if seizure-free, or switching to alternate agent if poorly tolerated with mood effects.  Follow Up Instructions: We ask that Lori Mcmillan return to clinic in 4 months following next brain MRI, or sooner as needed.  I discussed the assessment and treatment plan with the patient.  The patient was provided an opportunity to ask questions and all were answered.  The patient agreed with the plan and demonstrated understanding of the instructions.    The patient was advised to call back or seek an in-person evaluation if the symptoms worsen or  if the condition fails to improve as anticipated.   Ventura Sellers, MD   I provided 15 minutes of non face-to-face telephone visit time during this encounter, and > 50% was spent counseling as documented under my assessment & plan.

## 2021-01-12 ENCOUNTER — Telehealth: Payer: Self-pay | Admitting: Internal Medicine

## 2021-01-12 NOTE — Telephone Encounter (Signed)
Scheduled per 3/17 los. Called and spoke with patient. Confirmed appt

## 2021-01-15 ENCOUNTER — Emergency Department (INDEPENDENT_AMBULATORY_CARE_PROVIDER_SITE_OTHER)
Admission: EM | Admit: 2021-01-15 | Discharge: 2021-01-15 | Disposition: A | Discharge status: Home / Self Care | Payer: Medicare Other | Source: Home / Self Care | Attending: Family Medicine | Admitting: Family Medicine

## 2021-01-15 ENCOUNTER — Other Ambulatory Visit: Payer: Self-pay

## 2021-01-15 ENCOUNTER — Emergency Department (INDEPENDENT_AMBULATORY_CARE_PROVIDER_SITE_OTHER): Payer: Medicare Other

## 2021-01-15 DIAGNOSIS — R0789 Other chest pain: Secondary | ICD-10-CM

## 2021-01-15 DIAGNOSIS — R0781 Pleurodynia: Secondary | ICD-10-CM

## 2021-01-15 LAB — POCT URINALYSIS DIP (MANUAL ENTRY)
Bilirubin, UA: NEGATIVE
Glucose, UA: NEGATIVE mg/dL
Ketones, POC UA: NEGATIVE mg/dL
Leukocytes, UA: NEGATIVE
Nitrite, UA: NEGATIVE
Protein Ur, POC: NEGATIVE mg/dL
Spec Grav, UA: 1.025 (ref 1.010–1.025)
Urobilinogen, UA: 0.2 E.U./dL
pH, UA: 5.5 (ref 5.0–8.0)

## 2021-01-15 MED ORDER — HYDROCODONE-ACETAMINOPHEN 5-325 MG PO TABS: 1.0000 | ORAL_TABLET | Freq: Four times a day (QID) | ORAL | 0 refills | Status: DC | PRN

## 2021-01-15 MED ORDER — NAPROXEN 500 MG PO TABS: 500.0000 mg | ORAL_TABLET | Freq: Two times a day (BID) | ORAL | 0 refills | Status: DC

## 2021-01-15 NOTE — ED Provider Notes (Signed)
[facility]    CSN: [id number] Arrival date & time: 01/15/21  1736      History   Chief Complaint Chief Complaint  Patient presents with  . Flank Pain    RT    HPI Lori Mcmillan is a 60 y.o. female.   HPI   Patient is here for right flank pain.  She thinks it might be a kidney stone.  She thinks she might of had a kidney stone before but is never been diagnosed with this.  She is having flank pain for 5 days.  Is getting worse.  It hurts to move.  Hurts to cough or sneeze.  She has had no fever cough or respiratory infection.  She has had no fall or injury.  She has had no urinary symptoms.  Nausea or vomiting.  No fever or chills.  Patient was found to have a meningioma in 2018.  This was resected.  Now she is suffering from vertigo and seizures.  She is reluctant to take medication for this. Was previously a smoker and states that she has quit.   Past Medical History:  Diagnosis Date  . Anxiety    occ lorazepam  . Atypical meningioma of brain ([place]) 2018/2019   Resected, then RT Feb/Mar 2019.  No sign of residual dz at 04/2018 rad onc f/u and 10/2018 neuro-onc f/u. 03/2019 MRI brain->no resid/no recurrence.  . 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  Marland Kitchen Depression    zoloft in past--?wt gain.  . Diplopia    chronic (meningioma-related)  . H/O mammogram 11/21/12   B/L   . History of radiation therapy 02/24/12-04/11/12   right breast/ 45Gy@1 .8Gyx59fx/boost=16Gy@2  Gya13fx.  Latest mammo and u/s 03/2013--normal.  . History of radiation therapy 11/30/17- 01/11/18   Right temporal lobe treated to 55.8 Gy with 31 fx of 1.8 Gy  . Palpitations    has taken Metoprolol for palpitations in the past.  . Taste impairment    s/p radiation therapy  . Tobacco dependence 09/05/2013    Patient Active Problem List   Diagnosis Date Noted  . Seizures ([place]) 01/01/2021  . Chronic  daily headache 09/08/2018  . Atypical meningioma of brain ([place]) 11/16/2017  . Meningioma ([place]) 10/07/2017  . Health maintenance examination 09/05/2013  . Eustachian tube dysfunction 09/05/2013  . Colon cancer screening 09/05/2013  . Tobacco dependence 09/05/2013  . Cancer of upper-outer quadrant of female breast ([place]) 12/02/2011    Past Surgical History:  Procedure Laterality Date  . APPENDECTOMY  2008   emergency  . BRAIN TUMOR EXCISION  2018   Meningioma  . BREAST LUMPECTOMY  01/04/12   right breast  . CRANIECTOMY  10/19/2017   at [facility]    OB History   No obstetric history on file.      Home Medications    Prior to Admission medications   Medication Sig Start Date End Date Taking? Authorizing Provider  HYDROcodone-acetaminophen (NORCO/VICODIN) 5-325 MG tablet Take 1-2 tablets by mouth every 6 (six) hours as needed. 01/15/21  Yes Raylene Everts, MD  naproxen (NAPROSYN) 500 MG tablet Take 1 tablet (500 mg total) by mouth 2 (two) times daily. 01/15/21  Yes Raylene Everts, MD  acetaminophen (TYLENOL) 325 MG tablet Take by mouth.    [provider]  diazepam (VALIUM) 2 MG tablet Take by  mouth. 09/24/20   [provider]  levETIRAcetam (KEPPRA) 500 MG tablet Take 1 tablet (500 mg total) by mouth 2 (two) times daily. 10/31/20   Ventura Sellers, MD  Lidocaine 0.5 % GEL Apply 2 g topically as needed. 05/06/20   Ventura Sellers, MD  terbinafine (LAMISIL) 250 MG tablet Take by mouth. 10/02/20 10/02/21  [provider]  diphenhydrAMINE (BENADRYL) 25 MG tablet Take 25 mg by mouth every 6 (six) hours as needed. Patient not taking: Reported on 05/06/2020  01/15/21  [provider]    Family History Family History  Problem Relation Age of Onset  . Anesthesia problems Mother   . Cancer Mother        breast  . Cancer Maternal Aunt        breast  . Cancer Paternal Grandfather        colon    Social  History Social History   Tobacco Use  . Smoking status: Former Smoker    Packs/day: 0.50    Types: Cigarettes    Quit date: 10/17/2017    Years since quitting: 3.2  . Smokeless tobacco: Never Used  Vaping Use  . Vaping Use: Never used  Substance Use Topics  . Alcohol use: Yes    Alcohol/week: 5.0 standard drinks    Types: 5 Cans of beer per week  . Drug use: No     Allergies   Patient has no known allergies.   Review of Systems Review of Systems See HPI  Physical Exam Triage Vital Signs ED Triage Vitals  Enc Vitals Group     BP 01/15/21 1752 137/79     Pulse Rate 01/15/21 1752 (!) 105     Resp 01/15/21 1752 18     Temp 01/15/21 1752 98.1 F (36.7 C)     Temp Source 01/15/21 1752 Oral     SpO2 01/15/21 1752 97 %     Weight --      Height --      Head Circumference --      Peak Flow --      Pain Score 01/15/21 1753 6     Pain Loc --      Pain Edu? --      Excl. in [place]? --    No data found.  Updated Vital Signs BP 137/79 (BP Location: Right Arm)   Pulse (!) 105   Temp 98.1 F (36.7 C) (Oral)   Resp 18   LMP 05/11/2013   SpO2 97%      Physical Exam Constitutional:      General: She is not in acute distress.    Appearance: She is well-developed and normal weight.  HENT:     Head: Normocephalic and atraumatic.     Mouth/Throat:     Comments: Mask is in place Eyes:     Conjunctiva/sclera: Conjunctivae normal.     Pupils: Pupils are equal, round, and reactive to light.  Cardiovascular:     Rate and Rhythm: Normal rate and regular rhythm.     Heart sounds: Normal heart sounds.  Pulmonary:     Effort: Pulmonary effort is normal. No respiratory distress.     Breath sounds: Normal breath sounds.     Comments: Patient has chest wall tenderness at the lower rib border in the posterior and mid axillary line on the right.  Lungs are clear.  No abdominal pain.  No CVAT Chest:     Chest wall: Tenderness present.  Abdominal:  General: Abdomen is flat.  There is no distension.     Palpations: Abdomen is soft.     Tenderness: There is no abdominal tenderness.  Musculoskeletal:        General: Normal range of motion.     Cervical back: Normal range of motion and neck supple.  Skin:    General: Skin is warm and dry.  Neurological:     Mental Status: She is alert.  Psychiatric:        Behavior: Behavior normal.      UC Treatments / Results  Labs (all labs ordered are listed, but only abnormal results are displayed) Labs Reviewed  POCT URINALYSIS DIP (MANUAL ENTRY) - Abnormal; Notable for the following components:      Result Value   Blood, UA trace-lysed (*)    All other components within normal limits    EKG   Radiology DG Ribs Unilateral W/Chest Right  Result Date: 01/15/2021 CLINICAL DATA:  Pain in the right ribs EXAM: RIGHT RIBS AND CHEST - 3+ VIEW COMPARISON:  Chest x-ray 05/30/2013 FINDINGS: Single-view chest demonstrates no focal opacity or pleural effusion. Normal heart size. No pneumothorax. Clips in the right axilla. Right rib series demonstrates no acute displaced right rib fracture. IMPRESSION: Negative. Electronically Signed   By: Donavan Foil M.D.   On: 01/15/2021 18:37    Procedures Procedures (including critical care time)  Medications Ordered in UC Medications - No data to display  Initial Impression / Assessment and Plan / UC Course  I have reviewed the triage vital signs and the nursing notes.  Pertinent labs & imaging results that were available during my care of the patient were reviewed by me and considered in my medical decision making (see chart for details).     Patient has new onset of acute rib pain on the right with no injury.  She does have a history of breast cancer.  Out of concern for her cancer diagnosis I am adding imaging.  X-rays are negative.  We will treat conservatively Final Clinical Impressions(s) / UC Diagnoses   Final diagnoses:  Right-sided chest wall pain     Discharge  Instructions     Activity as tolerated Try ice or heat to area  take naproxen 2 times a day with food Take hydrocodone if needed severe pain.  This is useful at bedtime.  Do not take hydrocodone and drive Call your doctor if not improving by Monday      ED Prescriptions    Medication Sig Dispense Auth. Provider   naproxen (NAPROSYN) 500 MG tablet Take 1 tablet (500 mg total) by mouth 2 (two) times daily. 30 tablet Raylene Everts, MD   HYDROcodone-acetaminophen (NORCO/VICODIN) 5-325 MG tablet Take 1-2 tablets by mouth every 6 (six) hours as needed. 10 tablet Raylene Everts, MD     I have reviewed the PDMP during this encounter.   Raylene Everts, MD 01/15/21 Pauline Aus

## 2021-01-15 NOTE — ED Triage Notes (Signed)
Pt c/o intermittent RT sided flank pain X 5 days. Started as dull ache. Worsening in last 24 hours. Advil and gasX prn. Kidney stone last year.

## 2021-01-15 NOTE — Discharge Instructions (Signed)
Activity as tolerated Try ice or heat to area  take naproxen 2 times a day with food Take hydrocodone if needed severe pain.  This is useful at bedtime.  Do not take hydrocodone and drive Call your doctor if not improving by Monday

## 2021-01-18 ENCOUNTER — Telehealth: Payer: Self-pay | Admitting: Emergency Medicine

## 2021-01-18 ENCOUNTER — Other Ambulatory Visit: Payer: Self-pay

## 2021-01-18 ENCOUNTER — Encounter (HOSPITAL_COMMUNITY): Payer: Self-pay

## 2021-01-18 ENCOUNTER — Emergency Department (HOSPITAL_COMMUNITY): Payer: Medicare Other

## 2021-01-18 ENCOUNTER — Emergency Department (HOSPITAL_COMMUNITY)
Admission: EM | Admit: 2021-01-18 | Discharge: 2021-01-18 | Disposition: A | Discharge status: Home / Self Care | Payer: Medicare Other | Attending: Emergency Medicine | Admitting: Emergency Medicine

## 2021-01-18 DIAGNOSIS — R109 Unspecified abdominal pain: Secondary | ICD-10-CM | POA: Diagnosis present

## 2021-01-18 DIAGNOSIS — Z853 Personal history of malignant neoplasm of breast: Secondary | ICD-10-CM | POA: Diagnosis not present

## 2021-01-18 DIAGNOSIS — Z87891 Personal history of nicotine dependence: Secondary | ICD-10-CM | POA: Diagnosis not present

## 2021-01-18 LAB — CBC WITH DIFFERENTIAL/PLATELET
Abs Immature Granulocytes: 0.02 10*3/uL (ref 0.00–0.07)
Basophils Absolute: 0.1 10*3/uL (ref 0.0–0.1)
Basophils Relative: 1 %
Eosinophils Absolute: 0.1 10*3/uL (ref 0.0–0.5)
Eosinophils Relative: 2 %
HCT: 39.2 % (ref 36.0–46.0)
Hemoglobin: 12.5 g/dL (ref 12.0–15.0)
Immature Granulocytes: 0 %
Lymphocytes Relative: 21 %
Lymphs Abs: 1.4 10*3/uL (ref 0.7–4.0)
MCH: 29.5 pg (ref 26.0–34.0)
MCHC: 31.9 g/dL (ref 30.0–36.0)
MCV: 92.5 fL (ref 80.0–100.0)
Monocytes Absolute: 0.7 10*3/uL (ref 0.1–1.0)
Monocytes Relative: 11 %
Neutro Abs: 4.4 10*3/uL (ref 1.7–7.7)
Neutrophils Relative %: 65 %
Platelets: 243 10*3/uL (ref 150–400)
RBC: 4.24 MIL/uL (ref 3.87–5.11)
RDW: 13 % (ref 11.5–15.5)
WBC: 6.7 10*3/uL (ref 4.0–10.5)
nRBC: 0 % (ref 0.0–0.2)

## 2021-01-18 LAB — COMPREHENSIVE METABOLIC PANEL
ALT: 18 U/L (ref 0–44)
AST: 18 U/L (ref 15–41)
Albumin: 4.2 g/dL (ref 3.5–5.0)
Alkaline Phosphatase: 76 U/L (ref 38–126)
Anion gap: 8 (ref 5–15)
BUN: 15 mg/dL (ref 6–20)
CO2: 23 mmol/L (ref 22–32)
Calcium: 8.9 mg/dL (ref 8.9–10.3)
Chloride: 106 mmol/L (ref 98–111)
Creatinine, Ser: 0.57 mg/dL (ref 0.44–1.00)
GFR, Estimated: >60 mL/min (ref >60)
Glucose, Bld: 99 mg/dL (ref 70–99)
Potassium: 3.9 mmol/L (ref 3.5–5.1)
Sodium: 137 mmol/L (ref 135–145)
Total Bilirubin: 0.7 mg/dL (ref 0.3–1.2)
Total Protein: 7.1 g/dL (ref 6.5–8.1)

## 2021-01-18 LAB — URINALYSIS, ROUTINE W REFLEX MICROSCOPIC
Bilirubin Urine: NEGATIVE
Glucose, UA: NEGATIVE mg/dL
Hgb urine dipstick: NEGATIVE
Ketones, ur: NEGATIVE mg/dL
Leukocytes,Ua: NEGATIVE
Nitrite: NEGATIVE
Protein, ur: NEGATIVE mg/dL
Specific Gravity, Urine: 1.003 — ABNORMAL LOW (ref 1.005–1.030)
pH: 6 (ref 5.0–8.0)

## 2021-01-18 MED ORDER — HYDROCODONE-ACETAMINOPHEN 5-325 MG PO TABS: 1.0000 | ORAL_TABLET | Freq: Four times a day (QID) | ORAL | 0 refills | Status: DC | PRN

## 2021-01-18 NOTE — ED Provider Notes (Signed)
[facility] DEPT Provider Note   CSN: [id number] Arrival date & time: 01/18/21  1151     History Chief Complaint  Patient presents with  . Flank Pain    Lori Mcmillan is a 60 y.o. female.  The history is provided by the patient. No language interpreter was used.  Flank Pain This is a new problem. The problem occurs constantly. The problem has not changed since onset.Nothing aggravates the symptoms. Nothing relieves the symptoms. She has tried nothing for the symptoms. The treatment provided no relief.  Pt complains of right flank pain.  Pt reports area is painful and itchy.  Pt is taking hydrocodone and naprosyn.       Past Medical History:  Diagnosis Date  . Anxiety    occ lorazepam  . 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  No sign of residual dz at 04/2018 rad onc f/u and 10/2018 neuro-onc f/u. 03/2019 MRI brain->no resid/no recurrence.  . 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  Marland Kitchen Depression    zoloft in past--?wt gain.  . Diplopia    chronic (meningioma-related)  . H/O mammogram 11/21/12   B/L   . History of radiation therapy 02/24/12-04/11/12   right breast/ 45Gy@1 .8Gyx61fx/boost=16Gy@2  Gya54fx.  Latest mammo and u/s 03/2013--normal.  . History of radiation therapy 11/30/17- 01/11/18   Right temporal lobe treated to 55.8 Gy with 31 fx of 1.8 Gy  . Palpitations    has taken Metoprolol for palpitations in the past.  . Taste impairment    s/p radiation therapy  . Tobacco dependence 09/05/2013    Patient Active Problem List   Diagnosis Date Noted  . Seizures ([place]) 01/01/2021  . Chronic daily headache 09/08/2018  . 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 Meningioma ([place]) 10/07/2017  . Health maintenance examination 09/05/2013  . Eustachian tube dysfunction 09/05/2013  . Colon cancer  screening 09/05/2013  . Tobacco dependence 09/05/2013  . Cancer of upper-outer quadrant of female breast ([place]) 12/02/2011    Past Surgical History:  Procedure Laterality Date  . APPENDECTOMY  2008   emergency  . BRAIN TUMOR EXCISION  2018   Meningioma  . BREAST LUMPECTOMY  01/04/12   right breast  . CRANIECTOMY  10/19/2017   at [place]     OB History   No obstetric history on file.     Family History  Problem Relation Age of Onset  . Anesthesia problems Mother   . Cancer Mother        breast  . Cancer Maternal Aunt        breast  . Cancer Paternal Grandfather        colon    Social History   Tobacco Use  . Smoking status: Former Smoker    Packs/day: 0.50    Types: Cigarettes    Quit date: 10/17/2017    Years since quitting: 3.2  . Smokeless tobacco: Never Used  Vaping Use  . Vaping Use: Never used  Substance Use Topics  . Alcohol use: Yes    Alcohol/week: 5.0 standard drinks    Types: 5 Cans of beer per week  . Drug use: No    Home Medications Prior to Admission medications   Medication Sig Start Date End Date Taking? Authorizing Provider  acetaminophen (TYLENOL) 325 MG  tablet Take by mouth.    [provider]  diazepam (VALIUM) 2 MG tablet Take by mouth. 09/24/20   [provider]  HYDROcodone-acetaminophen (NORCO/VICODIN) 5-325 MG tablet Take 1-2 tablets by mouth every 6 (six) hours as needed. 01/18/21   Fransico Meadow, PA-C  levETIRAcetam (KEPPRA) 500 MG tablet Take 1 tablet (500 mg total) by mouth 2 (two) times daily. 10/31/20   Ventura Sellers, MD  Lidocaine 0.5 % GEL Apply 2 g topically as needed. 05/06/20   Vaslow, Acey Lav, MD  naproxen (NAPROSYN) 500 MG tablet Take 1 tablet (500 mg total) by mouth 2 (two) times daily. 01/15/21   Raylene Everts, MD  terbinafine (LAMISIL) 250 MG tablet Take by mouth. 10/02/20 10/02/21  [provider]  diphenhydrAMINE (BENADRYL) 25 MG tablet Take 25  mg by mouth every 6 (six) hours as needed. Patient not taking: Reported on 05/06/2020  01/15/21  [provider]    Allergies    Patient has no known allergies.  Review of Systems   Review of Systems  Genitourinary: Positive for flank pain.  All other systems reviewed and are negative.   Physical Exam Updated Vital Signs BP (!) 141/86   Pulse 68   Temp 98.6 F (37 C) (Oral)   Resp 18   LMP 05/11/2013   SpO2 100%   Physical Exam Vitals and nursing note reviewed.  Constitutional:      Appearance: She is well-developed.  HENT:     Head: Normocephalic.     Nose: Nose normal.     Mouth/Throat:     Mouth: Mucous membranes are moist.  Cardiovascular:     Rate and Rhythm: Normal rate.     Pulses: Normal pulses.  Pulmonary:     Effort: Pulmonary effort is normal.  Abdominal:     General: Abdomen is flat. There is no distension.     Palpations: Abdomen is soft.  Musculoskeletal:        General: Normal range of motion.     Cervical back: Normal range of motion.     Comments: Pain with movement,  Severe pain to touch,  No rash,  Pt has several scratch marks.   Skin:    General: Skin is warm.  Neurological:     Mental Status: She is alert and oriented to person, place, and time.  Psychiatric:        Mood and Affect: Mood normal.     ED Results / Procedures / Treatments   Labs (all labs ordered are listed, but only abnormal results are displayed) Labs Reviewed  URINALYSIS, ROUTINE W REFLEX MICROSCOPIC - Abnormal; Notable for the following components:      Result Value   Color, Urine STRAW (*)    Specific Gravity, Urine 1.003 (*)    All other components within normal limits  CBC WITH DIFFERENTIAL/PLATELET  COMPREHENSIVE METABOLIC PANEL    EKG None  Radiology CT RENAL STONE STUDY  Result Date: 01/18/2021 CLINICAL DATA:  Right flank pain 4 days.  Possible kidney stone. EXAM: CT ABDOMEN AND PELVIS WITHOUT CONTRAST TECHNIQUE: Multidetector CT imaging of the  abdomen and pelvis was performed following the standard protocol without IV contrast. COMPARISON:  None. FINDINGS: Lower chest: Lung bases are clear. Hepatobiliary: Liver, gallbladder and biliary tree are normal. Pancreas: Normal. Spleen: Normal. Adrenals/Urinary Tract: Adrenal glands are normal. Kidneys are normal size with a few punctate stones over the left kidney mostly over the lower pole. No right renal calculi. No  evidence of hydronephrosis or perinephric inflammation/fluid. Ureters and bladder are normal. Stomach/Bowel: The stomach and small bowel are normal. Previous appendectomy. Colon is normal. Vascular/Lymphatic: Mild calcified plaque over the abdominal aorta which is normal in caliber. No evidence of adenopathy. Reproductive: Normal. Other: No free fluid or focal inflammatory change. Musculoskeletal: No focal abnormality. IMPRESSION: 1. No acute findings in the abdomen/pelvis. 2. Few punctate nonobstructing left renal stones. No ureteral stones or obstruction. 3. Aortic atherosclerosis. Aortic Atherosclerosis (ICD10-I70.0). Electronically Signed   By: Marin Olp M.D.   On: 01/18/2021 13:44    Procedures Procedures   Medications Ordered in ED Medications - No data to display  ED Course  I have reviewed the triage vital signs and the nursing notes.  Pertinent labs & imaging results that were available during my care of the patient were reviewed by me and considered in my medical decision making (see chart for details).    MDM Rules/Calculators/A&P                          MDM:  No obvious cause for r sided pain,UA/Cbc and Cmet are normal  Pain sounds like shingles but I would expect pt to have a rash.  Pt advised to have her MD recheck here when she is seen.  Pt advised she may need further testing/evaluation Final Clinical Impression(s) / ED Diagnoses Final diagnoses:  Right flank pain    Rx / DC Orders ED Discharge Orders         Ordered    HYDROcodone-acetaminophen  (NORCO/VICODIN) 5-325 MG tablet  Every 6 hours PRN        01/18/21 1445        An After Visit Summary was printed and given to the patient.    Fransico Meadow, [place] 01/18/21 1452    Wyvonnia Dusky, MD 01/18/21 [phone]

## 2021-01-18 NOTE — Discharge Instructions (Addendum)
See your Physician for recheck next week.  Continue current medications

## 2021-01-18 NOTE — Telephone Encounter (Signed)
Call back to patient regarding increased pain - took 2 hydrocodone last night w/ minimal relief. Took naproxen w/ 1 hydrocodone at 0900- min relief. Took another hydrocodone at 1100- pain is easing off. Pt states she had less pain w/ appendicitis. RN spoke w/ provider here today  Faustino Congress, NP) who directed pt to go to ED for further evaluation. Patient stated her sister would drive her to [facility] ED today.

## 2021-01-18 NOTE — ED Triage Notes (Signed)
Pt reports right flank pain that began 3/28. Pt denies N/V/D. Pt reports taking two hydrocodone and a naproxen this morning without relief.

## 2021-01-20 ENCOUNTER — Encounter: Payer: Self-pay | Admitting: Family Medicine

## 2021-01-20 ENCOUNTER — Ambulatory Visit (INDEPENDENT_AMBULATORY_CARE_PROVIDER_SITE_OTHER): Payer: Medicare Other | Admitting: Family Medicine

## 2021-01-20 ENCOUNTER — Other Ambulatory Visit: Payer: Self-pay

## 2021-01-20 ENCOUNTER — Telehealth: Payer: Self-pay

## 2021-01-20 VITALS — BP 124/74 | HR 71 | Temp 97.6°F | Ht 63.0 in | Wt 165.0 lb

## 2021-01-20 DIAGNOSIS — R569 Unspecified convulsions: Secondary | ICD-10-CM | POA: Diagnosis not present

## 2021-01-20 DIAGNOSIS — D420 Neoplasm of uncertain behavior of cerebral meninges: Secondary | ICD-10-CM

## 2021-01-20 DIAGNOSIS — C50411 Malignant neoplasm of upper-outer quadrant of right female breast: Secondary | ICD-10-CM

## 2021-01-20 DIAGNOSIS — B029 Zoster without complications: Secondary | ICD-10-CM | POA: Diagnosis not present

## 2021-01-20 MED ORDER — GABAPENTIN 100 MG PO CAPS: 100.0000 mg | ORAL_CAPSULE | Freq: Three times a day (TID) | ORAL | 0 refills | Status: DC

## 2021-01-20 MED ORDER — VALACYCLOVIR HCL 1 G PO TABS: 1000.0000 mg | ORAL_TABLET | Freq: Three times a day (TID) | ORAL | 0 refills | Status: DC

## 2021-01-20 NOTE — Telephone Encounter (Signed)
Please advise patient the only medication that will come close to giving "immediate "relief is the topical gels or lidocaine patches.  The other oral medication, gabapentin will take time to be effective.  In which she can take 1-2 tabs every 8 hours.

## 2021-01-20 NOTE — Telephone Encounter (Signed)
Spoke with pt who was inquiring in medication to give immediate relief. Pt was informed to give the medication time to work for the next couple of days to help treat her shingles. Pt was reminded that the strongest medication was already once prescribed during the ED and she admitted that the medication did not help during OV and reminded of that statement. Pt verbalized understanding

## 2021-01-20 NOTE — Progress Notes (Signed)
Patient ID: Lori Mcmillan, female  DOB: [birth year], 60 y.o.   MRN: [id number] Patient Care Team    Relationship Specialty Notifications Start End  Ma Hillock, DO PCP - General Family Medicine  01/20/21   Eppie Gibson, MD Consulting Physician Radiation Oncology  03/23/18   Ventura Sellers, MD Consulting Physician Oncology  06/07/18   [facility], [facility]  Ophthalmology  01/22/21   Denzil Magnuson, MD Referring Physician Dermatology  01/22/21   Helayne Seminole, MD Referring Physician Otolaryngology  01/22/21     Chief Complaint  Patient presents with  . Establish Care    Acute: pt c/o sharp RUQ pain x 1 week; pt states that pain radiates to back; pt presents rash in same location as pain with redness and itching;     Subjective:  Lori Mcmillan is a 60 y.o.  female present for new patient establishment/pain/ED follow-up. All past medical history, surgical history, allergies, family history, immunizations, medications and social history were updated in the electronic medical record today. All recent labs, ED visits and hospitalizations within the last year were reviewed.  Flank pain: Patient presents today for new patient establishment with right-sided flank pain/abdominal pain.  She was seen in the ED/12/2020 for this condition.  Is a new problem for her and she has not had pain like this in the past at this location.  She reports the pain is rather severe and even the hydrocodone she received in the emergency room is not helping at all.  Denies fevers, chills, changes in bowel habits except for some mild constipation, dysuria or blood per rectum.  She denies rash.  Patient had a CT renal stone study in the emergency room which showed no acute findings in the abdomen/pelvis.  No ureteral stones or obstruction.  There was a few punctate nonobstructing left renal stones.  Lab work was essentially unremarkable with a CMP, CBC normal urinalysis.  Patient states the pain is  more severe today.  SHe describes the pain as a stinging and burning sensation.  Depression screen [facility] 2/9 01/20/2021 05/03/2018 02/24/2018 11/16/2017  Decreased Interest 0 0 2 0  Down, Depressed, Hopeless 0 0 1 0  PHQ - 2 Score 0 0 3 0   No flowsheet data found.      Fall Risk  05/03/2018 02/24/2018 11/16/2017  Falls in the past year? No No No    Immunization History  Administered Date(s) Administered  . Influenza,inj,Quad PF,6+ Mos 09/05/2013, 10/22/2017, 11/23/2019  . PFIZER(Purple Top)SARS-COV-2 Vaccination 01/07/2020, 01/28/2020  . Tdap 03/06/2019    No exam data present  Past Medical History:  Diagnosis Date  . Allergy   . Anxiety    occ lorazepam  . 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  No sign of residual dz at 04/2018 rad onc f/u and 10/2018 neuro-onc f/u. 03/2019 MRI brain->no resid/no recurrence.  . Brain embolism and thrombosis   . 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  Marland Kitchen Chicken pox   . Depression    zoloft in past--?wt gain.  . Diplopia    chronic (meningioma-related)  . Eustachian tube dysfunction 09/05/2013  . History of radiation therapy 02/24/12-04/11/12   right breast/ 45Gy@1 .8Gyx82fx/boost=16Gy@2  Gya36fx.  Latest mammo and u/s 03/2013--normal.  . History of radiation therapy 11/30/17- 01/11/18   Right temporal lobe treated to 55.8  Gy with 31 fx of 1.8 Gy  . Migraine   . Palpitations    has taken Metoprolol for palpitations in the past.  . Seizure ([place])   . Taste impairment    s/p radiation therapy  . Tobacco dependence 09/05/2013   No Known Allergies Past Surgical History:  Procedure Laterality Date  . APPENDECTOMY  2008   emergency  . BRAIN TUMOR EXCISION  2018   Meningioma  . BREAST LUMPECTOMY  01/04/12   right breast  . CRANIECTOMY  10/19/2017   at [place]   Family  History  Problem Relation Age of Onset  . Anesthesia problems Mother   . Cancer Mother        breast  . Breast cancer Mother   . Cancer Maternal Aunt        breast  . Cancer Paternal Grandfather        colon  . Pancreatic cancer Paternal Grandmother    Social History   Social History Narrative   Marital status/children/pets: Single.  No children.  Lives alone.  Has pets.Orig from [place] in [place].   Education/employment: Bachelor's degree, retired   Engineer, materials:      -smoke alarm in the home:Yes     - wears seatbelt: Yes                Allergies as of 01/20/2021   No Known Allergies     Medication List       Accurate as of January 20, 2021 11:59 PM. If you have any questions, ask your nurse or doctor.        STOP taking these medications   acetaminophen 325 MG tablet Commonly known as: TYLENOL Stopped by: Howard Pouch, DO   HYDROcodone-acetaminophen 5-325 MG tablet Commonly known as: NORCO/VICODIN Stopped by: Howard Pouch, DO   Lidocaine 0.5 % Gel Stopped by: Howard Pouch, DO   naproxen 500 MG tablet Commonly known as: NAPROSYN Stopped by: Howard Pouch, DO   terbinafine 250 MG tablet Commonly known as: LAMISIL Stopped by: Howard Pouch, DO     TAKE these medications   diazepam 2 MG tablet Commonly known as: VALIUM Take by mouth.   gabapentin 100 MG capsule Commonly known as: NEURONTIN Take 1-2 capsules (100-200 mg total) by mouth 3 (three) times daily. Started by: Howard Pouch, DO   levETIRAcetam 500 MG tablet Commonly known as: KEPPRA Take 1 tablet (500 mg total) by mouth 2 (two) times daily.   simethicone 125 MG chewable tablet Commonly known as: MYLICON Chew 403 mg by mouth every 6 (six) hours as needed for flatulence.   valACYclovir 1000 MG tablet Commonly known as: VALTREX Take 1 tablet (1,000 mg total) by mouth 3 (three) times daily. Started by: Howard Pouch, DO       All past medical history, surgical history, allergies, family history,  immunizations andmedications were updated in the EMR today and reviewed under the history and medication portions of their EMR.    Recent Results (from the past 2160 hour(s))  POCT urinalysis dipstick     Status: Abnormal   Collection Time: 01/15/21  6:03 PM  Result Value Ref Range   Color, UA yellow yellow   Clarity, UA clear clear   Glucose, UA negative negative mg/dL   Bilirubin, UA negative negative   Ketones, POC UA negative negative mg/dL   Spec Grav, UA 1.025 1.010 - 1.025   Blood, UA trace-lysed (A) negative  pH, UA 5.5 5.0 - 8.0   Protein Ur, POC negative negative mg/dL   Urobilinogen, UA 0.2 0.2 or 1.0 E.U./dL   Nitrite, UA Negative Negative   Leukocytes, UA Negative Negative  CBC with Differential/Platelet     Status: None   Collection Time: 01/18/21 12:46 PM  Result Value Ref Range   WBC 6.7 4.0 - 10.5 K/uL   RBC 4.24 3.87 - 5.11 MIL/uL   Hemoglobin 12.5 12.0 - 15.0 g/dL   HCT 39.2 36.0 - 46.0 %   MCV 92.5 80.0 - 100.0 fL   MCH 29.5 26.0 - 34.0 pg   MCHC 31.9 30.0 - 36.0 g/dL   RDW 13.0 11.5 - 15.5 %   Platelets 243 150 - 400 K/uL   nRBC 0.0 0.0 - 0.2 %   Neutrophils Relative % 65 %   Neutro Abs 4.4 1.7 - 7.7 K/uL   Lymphocytes Relative 21 %   Lymphs Abs 1.4 0.7 - 4.0 K/uL   Monocytes Relative 11 %   Monocytes Absolute 0.7 0.1 - 1.0 K/uL   Eosinophils Relative 2 %   Eosinophils Absolute 0.1 0.0 - 0.5 K/uL   Basophils Relative 1 %   Basophils Absolute 0.1 0.0 - 0.1 K/uL   Immature Granulocytes 0 %   Abs Immature Granulocytes 0.02 0.00 - 0.07 K/uL    Comment: Performed at [facility], [place] [address], [place], [place] [address]  Comprehensive metabolic panel     Status: None   Collection Time: 01/18/21 12:46 PM  Result Value Ref Range   Sodium 137 135 - 145 mmol/L   Potassium 3.9 3.5 - 5.1 mmol/L   Chloride 106 98 - 111 mmol/L   CO2 23 22 - 32 mmol/L   Glucose, Bld 99 70 - 99 mg/dL    Comment: Glucose reference range applies only to  samples taken after fasting for at least 8 hours.   BUN 15 6 - 20 mg/dL   Creatinine, Ser 0.57 0.44 - 1.00 mg/dL   Calcium 8.9 8.9 - 10.3 mg/dL   Total Protein 7.1 6.5 - 8.1 g/dL   Albumin 4.2 3.5 - 5.0 g/dL   AST 18 15 - 41 U/L   ALT 18 0 - 44 U/L   Alkaline Phosphatase 76 38 - 126 U/L   Total Bilirubin 0.7 0.3 - 1.2 mg/dL   GFR, Estimated >60 >60 mL/min    Comment: (NOTE) Calculated using the CKD-EPI Creatinine Equation (2021)    Anion gap 8 5 - 15    Comment: Performed at [facility], [place] [address], [place], [place] [address]  Urinalysis, Routine w reflex microscopic Urine, Clean Catch     Status: Abnormal   Collection Time: 01/18/21 12:52 PM  Result Value Ref Range   Color, Urine STRAW (A) YELLOW   APPearance CLEAR CLEAR   Specific Gravity, Urine 1.003 (L) 1.005 - 1.030   pH 6.0 5.0 - 8.0   Glucose, UA NEGATIVE NEGATIVE mg/dL   Hgb urine dipstick NEGATIVE NEGATIVE   Bilirubin Urine NEGATIVE NEGATIVE   Ketones, ur NEGATIVE NEGATIVE mg/dL   Protein, ur NEGATIVE NEGATIVE mg/dL   Nitrite NEGATIVE NEGATIVE   Leukocytes,Ua NEGATIVE NEGATIVE    Comment: Performed at [facility] [address], [place], [place] [address]    CT RENAL STONE STUDY  Result Date: 01/18/2021 CLINICAL DATA:  Right flank pain 4 days.  Possible kidney stone. EXAM: CT ABDOMEN AND PELVIS WITHOUT CONTRAST TECHNIQUE: Multidetector CT  imaging of the abdomen and pelvis was performed following the standard protocol without IV contrast. COMPARISON:  None. FINDINGS: Lower chest: Lung bases are clear. Hepatobiliary: Liver, gallbladder and biliary tree are normal. Pancreas: Normal. Spleen: Normal. Adrenals/Urinary Tract: Adrenal glands are normal. Kidneys are normal size with a few punctate stones over the left kidney mostly over the lower pole. No right renal calculi. No evidence of hydronephrosis or perinephric inflammation/fluid. Ureters and bladder are normal. Stomach/Bowel:  The stomach and small bowel are normal. Previous appendectomy. Colon is normal. Vascular/Lymphatic: Mild calcified plaque over the abdominal aorta which is normal in caliber. No evidence of adenopathy. Reproductive: Normal. Other: No free fluid or focal inflammatory change. Musculoskeletal: No focal abnormality. IMPRESSION: 1. No acute findings in the abdomen/pelvis. 2. Few punctate nonobstructing left renal stones. No ureteral stones or obstruction. 3. Aortic atherosclerosis. Aortic Atherosclerosis (ICD10-I70.0). Electronically Signed   By: Marin Olp M.D.   On: 01/18/2021 13:44     ROS: 14 pt review of systems performed and negative (unless mentioned in an HPI)  Objective: BP 124/74   Pulse 71   Temp 97.6 F (36.4 C) (Oral)   Ht 5\' 3"  (1.6 m)   Wt 165 lb (74.8 kg)   LMP 05/11/2013   SpO2 97%   BMI 29.23 kg/m  Gen: Afebrile. No acute distress. Nontoxic in appearance, well-developed, well-nourished, pleasant female HENT: AT. [address].  Eyes:Pupils Equal Round Reactive to light, Extraocular movements intact,  Conjunctiva without redness, discharge or icterus. Neck/lymp/endocrine: Supple, no lymphadenopathy CV: RRR Chest: CTAB Abd: Soft.. NTND. BS present.  No masses palpated. No hepatosplenomegaly. No rebound tenderness or guarding. Skin: Red maculopapular rash present midline thoracic along right dermatome to midline abdomen present. Neuro/Msk: Normal gait. PERLA. EOMi. Alert. Oriented x3.  Assessment/plan: JUNICE FEI is a 60 y.o. female present for est care Atypical meningioma of brain (H[facility])/seizures Managed by neuro oncology Dr. Mickeal Skinner. Of note, she is not taking her Keppra because she does not like the side effects  Shingles infection: Patient has signs and symptoms of early shingles infection on exam today.  Encouraged her to make sure she is following up with all her specialty teams concerning her cancer diagnoses and preventative measures. Start Valtrex 3 times daily x10  days Start gabapentin 100-200 mg 3 times daily OTC lidocaine patches and creams recommended. Follow-up as needed   No follow-ups on file.  No orders of the defined types were placed in this encounter.  Meds ordered this encounter  Medications  . valACYclovir (VALTREX) 1000 MG tablet    Sig: Take 1 tablet (1,000 mg total) by mouth 3 (three) times daily.    Dispense:  30 tablet    Refill:  0  . gabapentin (NEURONTIN) 100 MG capsule    Sig: Take 1-2 capsules (100-200 mg total) by mouth 3 (three) times daily.    Dispense:  180 capsule    Refill:  0   Referral Orders  No referral(s) requested today     Note is dictated utilizing voice recognition software. Although note has been proof read prior to signing, occasional typographical errors still can be missed. If any questions arise, please do not hesitate to call for verification.  Electronically signed by: Howard Pouch, DO [place]

## 2021-01-20 NOTE — Patient Instructions (Addendum)
Nice to meet you today. You have shingles.   Start gabapentin 1-2 tabs every 8 hours.  Start valtrex 1 tab every 8 hours for 10 days.   Buy over the counter lidocaine gel and patches.  Gas: buy over the counter continue gasx, start senakot before bed and 1 cap miralax in 8 ounces water daily.   Stop hydrocodone and naproxen.    Shingles  Shingles is an infection. It gives you a painful skin rash and blisters that have fluid in them. Shingles is caused by the same germ (virus) that causes chickenpox. Shingles only happens in people who:  Have had chickenpox.  Have been given a shot of medicine (vaccine) to protect against chickenpox. Shingles is rare in this group. The first symptoms of shingles may be itching, tingling, or pain in an area on your skin. A rash will show on your skin a few days or weeks later. The rash is likely to be on one side of your body. The rash usually has a shape like a belt or a band. Over time, the rash turns into fluid-filled blisters. The blisters will break open, change into scabs, and dry up. Medicines may:  Help with pain and itching.  Help you get better sooner.  Help to prevent long-term problems. Follow these instructions at home: Medicines  Take over-the-counter and prescription medicines only as told by your doctor.  Put on an anti-itch cream or numbing cream where you have a rash, blisters, or scabs. Do this as told by your doctor. Helping with itching and discomfort  Put cold, wet cloths (cold compresses) on the area of the rash or blisters as told by your doctor.  Cool baths can help you feel better. Try adding baking soda or dry oatmeal to the water to lessen itching. Do not bathe in hot water.   Blister and rash care  Keep your rash covered with a loose bandage (dressing).  Wear loose clothing that does not rub on your rash.  Keep your rash and blisters clean. To do this, wash the area with mild soap and cool water as told by your  doctor.  Check your rash every day for signs of infection. Check for: ? More redness, swelling, or pain. ? Fluid or blood. ? Warmth. ? Pus or a bad smell.  Do not scratch your rash. Do not pick at your blisters. To help you to not scratch: ? Keep your fingernails clean and cut short. ? Wear gloves or mittens when you sleep, if scratching is a problem. General instructions  Rest as told by your doctor.  Keep all follow-up visits as told by your doctor. This is important.  Wash your hands often with soap and water. If soap and water are not available, use hand sanitizer. Doing this lowers your chance of getting a skin infection caused by germs (bacteria).  Your infection can cause chickenpox in people who have never had chickenpox or never got a shot of chickenpox vaccine. If you have blisters that did not change into scabs yet, try not to touch other people or be around other people, especially: ? Babies. ? Pregnant women. ? Children who have areas of red, itchy, or rough skin (eczema). ? Very old people who have transplants. ? People who have a long-term (chronic) sickness, like cancer or AIDS. Contact a doctor if:  Your pain does not get better with medicine.  Your pain does not get better after the rash heals.  You have any signs  of infection in the rash area. These signs include: ? More redness, swelling, or pain around the rash. ? Fluid or blood coming from the rash. ? The rash area feeling warm to the touch. ? Pus or a bad smell coming from the rash. Get help right away if:  The rash is on your face or nose.  You have pain in your face or pain by your eye.  You lose feeling on one side of your face.  You have trouble seeing.  You have ear pain, or you have ringing in your ear.  You have a loss of taste.  Your condition gets worse. Summary  Shingles gives you a painful skin rash and blisters that have fluid in them.  Shingles is an infection. It is caused by  the same germ (virus) that causes chickenpox.  Keep your rash covered with a loose bandage (dressing). Wear loose clothing that does not rub on your rash.  If you have blisters that did not change into scabs yet, try not to touch other people or be around people. This information is not intended to replace advice given to you by your health care provider. Make sure you discuss any questions you have with your health care provider. Document Revised: 01/26/2019 Document Reviewed: 06/08/2017 Elsevier Patient Education  2021 [employer].   Postherpetic Neuralgia Postherpetic neuralgia (PHN) is nerve pain that occurs after a shingles infection. Shingles is a painful rash that appears on one area of the body, usually on the trunk or face. Shingles is caused by the varicella-zoster virus. This is the same virus that causes chickenpox. In people who have had chickenpox, the virus can resurface years later and cause shingles. You may have PHN if you continue to have pain for 4 months after your shingles rash has gone away. PHN appears in the same area where you had the shingles rash. The pain usually goes away after the rash disappears. Getting a vaccination for shingles can prevent PHN. This vaccine is recommended for people older than 60. It may prevent shingles, and may also lower your risk of PHN if you do get shingles. What are the causes? This condition is caused by damage to your nerves from the varicella-zoster virus. The damage makes your nerves overly sensitive. What increases the risk? The following factors may make you more likely to develop this condition:  Being older than 58 years of age.  Having severe pain before your shingles rash starts.  Having a severe rash.  Having shingles in and around the eye area.  Having a disease that makes your body unable to fight infections (weak immune system). What are the signs or symptoms? The main symptom of this condition is pain. The pain  may:  Often be very bad and may be described as stabbing, burning, or feeling like an electric shock.  Come and go or may be there all the time.  Be triggered by light touches on the skin or changes in temperature. You may have itching along with the pain. How is this diagnosed? This condition may be diagnosed based on your symptoms and your history of shingles. Lab studies and other diagnostic tests are usually not needed. How is this treated? There is no cure for this condition. Treatment for PHN will focus on pain relief. Over-the-counter pain relievers do not usually relieve PHN pain. You may need to work with a pain specialist. Treatment may include:  Antidepressant medicines to help with pain and improve sleep.  Anti-seizure medicines to relieve nerve pain.  Strong pain relievers (opioids).  A numbing patch worn on the skin (lidocaine patch).  Botox (botulinum toxin) injections to block pain signals between nerves and muscles.  Injections of numbing medicine or anti-inflammatory medicines around irritated nerves. Follow these instructions at home:  It may take a long time to recover from PHN. Work closely with your health care provider and develop a good support system at home.  Take over-the-counter and prescription medicines only as told by your health care provider.  Do not drive or use heavy machinery while taking prescription pain medicine.  Wear loose, comfortable clothing.  Cover sensitive areas with a dressing to reduce friction from clothing rubbing on the area.  If directed, put ice on the painful area: ? Put ice in a plastic bag. ? Place a towel between your skin and the bag. ? Leave the ice on for 20 minutes, 2-3 times a day.  Talk to your health care provider if you feel depressed or desperate. Living with long-term pain can be depressing.  Keep all follow-up visits as told by your health care provider. This is important.   Contact a health care provider  if:  Your medicine is not helping.  You are struggling to manage your pain at home. Summary  Postherpetic neuralgia is a very painful disorder that can occur after an episode of shingles.  The pain is often severe, burning, electric, or stabbing.  Prescription medicines can be helpful in managing persistent pain.  Getting a vaccination for shingles can prevent PHN. This vaccine is recommended for people older than 60. This information is not intended to replace advice given to you by your health care provider. Make sure you discuss any questions you have with your health care provider. Document Revised: 09/16/2017 Document Reviewed: 12/21/2016 Elsevier Patient Education  2021 [employer].  Please help [place] help you:  We are honored you have chosen [facility] for your Primary Care home. Below you will find basic instructions that you may need to access in the future. Please help [place] help you by reading the instructions, which cover many of the frequent questions we experience.   Prescription refills and request:  -In order to allow more efficient response time, please call your pharmacy for all refills. They will forward the request electronically to [place]. This allows for the quickest possible response. Request left on a nurse line can take longer to refill, since these are checked as time allows between office patients and other phone calls.  - refill request can take up to 3-5 working days to complete.  - If request is sent electronically and request is appropiate, it is usually completed in 1-2 business days.  - all patients will need to be seen routinely for all chronic medical conditions requiring prescription medications (see follow-up below). If you are overdue for follow up on your condition, you will be asked to make an appointment and we will call in enough medication to cover you until your appointment (up to 30 days).  - all controlled substances will require a face to face  visit to request/refill.  - if you desire your prescriptions to go through a new pharmacy, and have an active script at original pharmacy, you will need to call your pharmacy and have scripts transferred to new pharmacy. This is completed between the pharmacy locations and not by your provider.    Results: Our office handles many outgoing and incoming calls daily. If we  have not contacted you within 1 week about your results, please check your mychart to see if there is a message first and if not, then contact our office.  In helping with this matter, you help decrease call volume, and therefore allow [place] to be able to respond to patients needs more efficiently.  We will always attempt to call you with results,  normal or abnormal. However, if we are unable to reach you we will send a message in your my chart with results.   Acute office visits (sick visit):  An acute visit is intended for a new problem and are scheduled in shorter time slots to allow schedule openings for patients with new problems. This is the appropriate visit to discuss a new problem. Problems will not be addressed by phone call or Echart message. Appointment is needed if requesting treatment. In order to provide you with excellent quality medical care with proper time for you to explain your problem, have an exam and receive treatment with instructions, these appointments should be limited to one new problem per visit. If you experience a new problem, in which you desire to be addressed, please make an acute office visit, we save openings on the schedule to accommodate you. Please do not save your new problem for any other type of visit, let us take care of it properly and quickly for you.   Follow up visits:  Depending on your condition(s) your provider will need to see you routinely in order to provide you with quality care and prescribe medication(s). Most chronic conditions (Example: hypertension, Diabetes, depression/anxiety...  etc), require visits a couple times a year. Your provider will instruct you on proper follow up for your personal medical conditions and history. Please make certain to make follow up appointments for your condition as instructed. Failing to do so could result in lapse in your medication treatment/refills. If you request a refill, and are overdue to be seen on a condition, we will always provide you with a 30 day script (once) to allow you time to schedule.    Medicare wellness (well visit): - we have a wonderful Nurse Maudie Mercury), that will meet with you and provide you will yearly medicare wellness visits. These visits should occur yearly (can not be scheduled less than 1 calendar year apart) and cover preventive health, immunizations, advance directives and screenings you are entitled to yearly through your medicare benefits. Do not miss out on your entitled benefits, this is when medicare will pay for these benefits to be ordered for you.  These are strongly encouraged by your provider and is the appropriate type of visit to make certain you are up to date with all preventive health benefits. If you have not had your medicare wellness exam in the last 12 months, please make certain to schedule one by calling the office and schedule your medicare wellness with Maudie Mercury as soon as possible.   Yearly physical (well visit):  - Adults are recommended to be seen yearly for physicals. Check with your insurance and date of your last physical, most insurances require one calendar year between physicals. Physicals include all preventive health topics, screenings, medical exam and labs that are appropriate for gender/age and history. You may have fasting labs needed at this visit. This is a well visit (not a sick visit), new problems should not be covered during this visit (see acute visit).  - Pediatric patients are seen more frequently when they are younger. Your provider will advise you on  well child visit timing that is  appropriate for your their age. - This is not a medicare wellness visit. Medicare wellness exams do not have an exam portion to the visit. Some medicare companies allow for a physical, some do not allow a yearly physical. If your medicare allows a yearly physical you can schedule the medicare wellness with our nurse Maudie Mercury and have your physical with your provider after, on the same day. Please check with insurance for your full benefits.   Late Policy/No Shows:  - all new patients should arrive 15-30 minutes earlier than appointment to allow [place] time  to  obtain all personal demographics,  insurance information and for you to complete office paperwork. - All established patients should arrive 10-15 minutes earlier than appointment time to update all information and be checked in .  - In our best efforts to run on time, if you are late for your appointment you will be asked to either reschedule or if able, we will work you back into the schedule. There will be a wait time to work you back in the schedule,  depending on availability.  - If you are unable to make it to your appointment as scheduled, please call 24 hours ahead of time to allow [place] to fill the time slot with someone else who needs to be seen. If you do not cancel your appointment ahead of time, you may be charged a no show fee.

## 2021-01-20 NOTE — Telephone Encounter (Signed)
Patient states she was here this morning to see Dr. Raoul Pitch, and pain medication she prescribed for her is not touching her pain.  She is crying.  She asked to speak with Toria and I told her that Adolm Joseph was not available, and I could have her call her. She said okay, while crying.  Please call [phone]

## 2021-01-21 NOTE — Telephone Encounter (Signed)
Spoke with pt and stated that the gel and patches gives no relief. Pt states that she will start taking 2 of the gabapentin.   FYI

## 2021-01-21 NOTE — Telephone Encounter (Signed)
Patient states she is screaming and crying because she is in so much pain.  She would like to know if there is something she can take in the interim until Gabapentin starts working  Please call [phone]

## 2021-01-22 ENCOUNTER — Encounter: Payer: Self-pay | Admitting: Family Medicine

## 2021-01-23 ENCOUNTER — Telehealth: Payer: Self-pay | Admitting: Internal Medicine

## 2021-01-23 ENCOUNTER — Encounter: Payer: Self-pay | Admitting: Internal Medicine

## 2021-01-23 NOTE — Telephone Encounter (Signed)
Scheduled per sch msg. Called and spoke with patient. Confirmed date and time ° °

## 2021-01-29 ENCOUNTER — Other Ambulatory Visit: Payer: Self-pay

## 2021-01-29 ENCOUNTER — Inpatient Hospital Stay: Payer: Medicare Other | Attending: Internal Medicine | Admitting: Internal Medicine

## 2021-01-29 VITALS — BP 128/80 | HR 81 | Temp 97.7°F | Resp 17 | Ht 63.0 in | Wt 160.4 lb

## 2021-01-29 DIAGNOSIS — B0229 Other postherpetic nervous system involvement: Secondary | ICD-10-CM | POA: Diagnosis not present

## 2021-01-29 DIAGNOSIS — D420 Neoplasm of uncertain behavior of cerebral meninges: Secondary | ICD-10-CM | POA: Diagnosis not present

## 2021-01-29 DIAGNOSIS — Z923 Personal history of irradiation: Secondary | ICD-10-CM | POA: Insufficient documentation

## 2021-01-29 DIAGNOSIS — Z79899 Other long term (current) drug therapy: Secondary | ICD-10-CM | POA: Diagnosis not present

## 2021-01-29 MED ORDER — CAPSAICIN 0.033 % EX CREA: 1.0000 "application " | TOPICAL_CREAM | Freq: Four times a day (QID) | CUTANEOUS | 0 refills | Status: DC | PRN

## 2021-01-29 MED ORDER — GABAPENTIN 300 MG PO CAPS: 600.0000 mg | ORAL_CAPSULE | Freq: Three times a day (TID) | ORAL | 3 refills | Status: DC

## 2021-01-29 MED ORDER — AMITRIPTYLINE HCL 25 MG PO TABS: 25.0000 mg | ORAL_TABLET | Freq: Every day | ORAL | 2 refills | Status: DC

## 2021-01-29 NOTE — Progress Notes (Signed)
[facility] [place], [place] [address] [phone]   Interval Evaluation  Date of Service: 01/29/21 Patient Name: Lori Mcmillan Patient MRN: [id number] Patient DOB: 11-17-1960 Provider: Ventura Sellers, MD  Identifying Statement:  Lori Mcmillan is a 60 y.o. 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 Isidore Moos  Interval History:  Lori Mcmillan presents today for follow up after new pain symptoms.  She describes new onset rash along left lateral aspect of her chest, starting couple of weeks ago.  She was diagnosed with shingles and treated successfully with Valtrex.  Although rash has improved (not totally resolved), she has experienced burning and stabbing pain at the site of the rash.  This pain is consistent, but also "flares" at times.  It is keeping her awake "all night long".  She has been dosing Gabapentin 300mg  three times per day with little or no improvement.  Otherwise, no new focal symptoms. Double vision remains as prior.  Otherwise no frank headaches.  Prior (05/03/18): Completed radiation at the end of March.  She has MRI brain for review today.  She describes no new neurologic deficits.  Does have frequent moderate headaches, has been taking Tylenol daily for the past 3 months.  Continues to have double vision which limits her ability to drive, read, perform some ADLs.  She has noted history of grade I breast cancer treated with radiation several years ago.  Medications: Current Outpatient Medications on File Prior to Visit  Medication Sig Dispense Refill  . diazepam (VALIUM) 2 MG tablet Take by mouth. (Patient not taking: Reported on 01/20/2021)    . gabapentin (NEURONTIN) 100 MG capsule Take 1-2  capsules (100-200 mg total) by mouth 3 (three) times daily. 180 capsule 0  . levETIRAcetam (KEPPRA) 500 MG tablet Take 1 tablet (500 mg total) by mouth 2 (two) times daily. (Patient not taking: Reported on 01/20/2021) 60 tablet 3  . simethicone (MYLICON) 631 MG chewable tablet Chew 125 mg by mouth every 6 (six) hours as needed for flatulence.    . valACYclovir (VALTREX) 1000 MG tablet Take 1 tablet (1,000 mg total) by mouth 3 (three) times daily. 30 tablet 0  . [DISCONTINUED] diphenhydrAMINE (BENADRYL) 25 MG tablet Take 25 mg by mouth every 6 (six) hours as needed. (Patient not taking: Reported on 05/06/2020)     No current facility-administered medications on file prior to visit.    Allergies: No Known Allergies Past Medical History:  Past Medical History:  Diagnosis Date  . Allergy   . Anxiety    occ lorazepam  . Atypical meningioma of brain ([place]) 2018/2019   Resected, then RT Feb/Mar 2019.  No sign of residual dz at 04/2018 rad onc f/u and 10/2018 neuro-onc f/u. 03/2019 MRI brain->no resid/no recurrence.  . Brain embolism and thrombosis   . 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  Mar[place] Kitchen Chicken pox   . Depression    zoloft in past--?wt gain.  . Diplopia    chronic (meningioma-related)  . Eustachian tube dysfunction 09/05/2013  . History of radiation therapy 02/24/12-04/11/12   right breast/ 45Gy@1 .8Gyx59fx/boost=16Gy@2  Gya58fx.  Latest mammo and u/s 03/2013--normal.  . History of  radiation therapy 11/30/17- 01/11/18   Right temporal lobe treated to 55.8 Gy with 31 fx of 1.8 Gy  . Migraine   . Palpitations    has taken Metoprolol for palpitations in the past.  . Seizure ([place])   . Taste impairment    s/p radiation therapy  . Tobacco dependence 09/05/2013   Past Surgical History:  Past Surgical History:  Procedure Laterality Date  . APPENDECTOMY  2008   emergency  . BRAIN TUMOR EXCISION   2018   Meningioma  . BREAST LUMPECTOMY  01/04/12   right breast  . CRANIECTOMY  10/19/2017   at [place]   Social History:  Social History   Socioeconomic History  . Marital status: Single    Spouse name: Not on file  . Number of children: Not on file  . Years of education: Not on file  . Highest education level: Not on file  Occupational History  . Not on file  Tobacco Use  . Smoking status: Former Smoker    Packs/day: 0.50    Types: Cigarettes    Quit date: 10/17/2017    Years since quitting: 3.2  . Smokeless tobacco: Never Used  Vaping Use  . Vaping Use: Never used  Substance and Sexual Activity  . Alcohol use: Yes    Alcohol/week: 12.0 standard drinks    Types: 12 Cans of beer per week  . Drug use: Not Currently  . Sexual activity: Not Currently    Partners: Male  Other Topics Concern  . Not on file  Social History Narrative   Marital status/children/pets: Single.  No children.  Lives alone.  Has pets.Orig from [place] in [place].   Education/employment: Bachelor's degree, retired   Engineer, materials:      -smoke alarm in the home:Yes     - wears seatbelt: Yes               Social Determinants of Health   Financial Resource Strain: Not on file  Food Insecurity: Not on file  Transportation Needs: Not on file  Physical Activity: Not on file  Stress: Not on file  Social Connections: Not on file  Intimate Partner Violence: Not on file   Family History:  Family History  Problem Relation Age of Onset  . Anesthesia problems Mother   . Cancer Mother        breast  . Breast cancer Mother   . Cancer Maternal Aunt        breast  . Cancer Paternal Grandfather        colon  . Pancreatic cancer Paternal Grandmother     Review of Systems: Constitutional: Denies fevers, chills or abnormal weight loss Eyes: Double vision Ears, nose, mouth, throat, and face: Denies mucositis or sore throat Respiratory: Denies cough, dyspnea or  wheezes Cardiovascular: Denies palpitation, chest discomfort or lower extremity swelling Gastrointestinal:  Denies nausea, constipation, diarrhea GU: Denies dysuria or incontinence Skin: Denies abnormal skin rashes Neurological: Per HPI Musculoskeletal: Denies joint pain, back or neck discomfort. No decrease in ROM Behavioral/Psych: +anxiety  Physical Exam: Vitals:   01/29/21 0904  BP: 128/80  Pulse: 81  Resp: 17  Temp: 97.7 F (36.5 C)  SpO2: 97%   KPS: 90. General: Alert, cooperative, pleasant, in no acute distress Head: Craniotomy scar noted, dry and intact. EENT: No conjunctival injection or scleral icterus. Oral mucosa moist Lungs: Resp effort normal Cardiac: Regular rate and rhythm Abdomen: Soft, non-distended abdomen Skin:  R T9 dermatomal rash Extremities: No clubbing or edema  Neurologic Exam: Mental Status: Awake, alert, attentive to examiner. Oriented to self and environment. Language is fluent with intact comprehension. Age advanced pyschomotor slowing with 2/3 object recall at 5 minutes. Cranial Nerves: Visual acuity is grossly normal. Visual fields are full. Extra-ocular movements intact with horizontal diplopia on left gaze. No ptosis. Face is symmetric, tongue midline.  Some sensory loss right>left face Motor: Tone and bulk are normal. Power is full in both arms and legs. Reflexes are symmetric, no pathologic reflexes present. Intact finger to nose bilaterally Sensory: Intact to light touch and temperature Gait: Normal and tandem gait is normal.   Labs: I have reviewed the data as listed    Component Value Date/Time   NA 137 01/18/2021 1246   NA 140 11/23/2019 0000   K 3.9 01/18/2021 1246   CL 106 01/18/2021 1246   CO2 23 01/18/2021 1246   GLUCOSE 99 01/18/2021 1246   BUN 15 01/18/2021 1246   BUN 9 11/23/2019 0000   CREATININE 0.57 01/18/2021 1246   CREATININE 0.64 09/18/2013 0900   CALCIUM 8.9 01/18/2021 1246   PROT 7.1 01/18/2021 1246   ALBUMIN  4.2 01/18/2021 1246   AST 18 01/18/2021 1246   ALT 18 01/18/2021 1246   ALKPHOS 76 01/18/2021 1246   BILITOT 0.7 01/18/2021 1246   GFRNONAA >60 01/18/2021 1246   GFRAA 113 11/23/2019 0000   Lab Results  Component Value Date   WBC 6.7 01/18/2021   NEUTROABS 4.4 01/18/2021   HGB 12.5 01/18/2021   HCT 39.2 01/18/2021   MCV 92.5 01/18/2021   PLT 243 01/18/2021     Assessment/Plan Atypical meningioma of brain (HCC)  Post herpetic neuralgia  Ms. Ferryman presents today with neuropathic pain syndrome, consistent with post-herpetic neuralgia.  She has not experienced relief with 900mg  daily gabapentin.   We recommended continuing up-titration of Gabapentin, increasing by 300mg  daily every 3 days until 1800mg  dose level is reached.    Given nocturnal predilection and sleep issues, we also recommended trial of Elavil 50mg  HS.   She may also use capsaicin cream as needed for breakthrough localized pain.  If the above is ineffective we will continue by titrating in alterate agents.  We will touch base with her via phone in ~10 days to assess response to therapy.   We appreciate the opportunity to participate in the care of Lori Mcmillan.   All questions were answered. The patient knows to call the clinic with any problems, questions or concerns. No barriers to learning were detected.  I have spent a total of 30 minutes of face-to-face and non-face-to-face time, excluding clinical staff time, preparing to see patient, ordering tests and/or medications, counseling the patient, and independently interpreting results and communicating results to the patient/family/caregiver    Ventura Sellers, MD Medical Director of Neuro-Oncology [facility] at [place] 01/29/21 9:04 AM

## 2021-02-09 ENCOUNTER — Inpatient Hospital Stay (HOSPITAL_BASED_OUTPATIENT_CLINIC_OR_DEPARTMENT_OTHER): Payer: Medicare Other | Admitting: Internal Medicine

## 2021-02-09 DIAGNOSIS — B0229 Other postherpetic nervous system involvement: Secondary | ICD-10-CM | POA: Diagnosis not present

## 2021-02-09 NOTE — Progress Notes (Signed)
I connected with Lori Mcmillan on 02/09/21 at 11:30 AM EDT by telephone visit and verified that I am speaking with the correct person using two identifiers.  I discussed the limitations, risks, security and privacy concerns of performing an evaluation and management service by telemedicine and the availability of in-person appointments. I also discussed with the patient that there may be a patient responsible charge related to this service. The patient expressed understanding and agreed to proceed.  Other persons participating in the visit and their role in the encounter:  n/a  Patient's location:  Home  Provider's location:  Office  Chief Complaint:  Post herpetic neuralgia  History of Present Ilness: Lori Mcmillan describes considerable improvement in neuropathic symptoms since increasing the gabapentin and starting the nightly Elavil.  She had to decrease the dose down to 25mg  as it made her a little groggy in the morning.  Overall she does have some discomfort remaining, but it is much better tolerated.  Her sleep has improved considerably due to pain relief. Observations: Language and cognition at baseline Assessment and Plan: Post herpetic neuralgia  She may increase Gabapentin to 900/600/900 if amenable, desiring more pain control.  If pain continues to improve, however, she can begin weaning the dose.  Ok to con't Elavil 25mg  HS for now.  Follow Up Instructions: RTC as scheduled in July  I discussed the assessment and treatment plan with the patient.  The patient was provided an opportunity to ask questions and all were answered.  The patient agreed with the plan and demonstrated understanding of the instructions.    The patient was advised to call back or seek an in-person evaluation if the symptoms worsen or if the condition fails to improve as anticipated.  I provided 5-10 minutes of non-face-to-face time during this enocunter.  Ventura Sellers, MD   I provided 15  minutes of non face-to-face telephone visit time during this encounter, and > 50% was spent counseling as documented under my assessment & plan.

## 2021-02-18 ENCOUNTER — Ambulatory Visit: Payer: Medicare Other | Admitting: Family Medicine

## 2021-02-19 ENCOUNTER — Ambulatory Visit: Payer: Medicare Other | Admitting: Family Medicine

## 2021-02-20 ENCOUNTER — Other Ambulatory Visit: Payer: Self-pay | Admitting: Internal Medicine

## 2021-02-23 NOTE — Telephone Encounter (Signed)
Requesting 90 day refill

## 2021-04-08 ENCOUNTER — Other Ambulatory Visit: Payer: Self-pay | Admitting: *Deleted

## 2021-04-22 ENCOUNTER — Ambulatory Visit (HOSPITAL_COMMUNITY)
Admission: RE | Admit: 2021-04-22 | Discharge: 2021-04-22 | Disposition: A | Discharge status: Home / Self Care | Payer: Medicare Other | Source: Ambulatory Visit | Attending: Internal Medicine | Admitting: Internal Medicine

## 2021-04-22 ENCOUNTER — Other Ambulatory Visit: Payer: Self-pay

## 2021-04-22 DIAGNOSIS — D420 Neoplasm of uncertain behavior of cerebral meninges: Secondary | ICD-10-CM

## 2021-04-22 MED ORDER — GADOBUTROL 1 MMOL/ML IV SOLN
7.0000 mL | Freq: Once | INTRAVENOUS | Status: AC | PRN
  Administered 2021-04-22: 7 mL via INTRAVENOUS

## 2021-04-27 ENCOUNTER — Inpatient Hospital Stay: Payer: Medicare Other | Attending: Internal Medicine

## 2021-04-27 DIAGNOSIS — Z923 Personal history of irradiation: Secondary | ICD-10-CM | POA: Insufficient documentation

## 2021-04-27 DIAGNOSIS — Z79899 Other long term (current) drug therapy: Secondary | ICD-10-CM | POA: Insufficient documentation

## 2021-04-27 DIAGNOSIS — Z853 Personal history of malignant neoplasm of breast: Secondary | ICD-10-CM | POA: Insufficient documentation

## 2021-04-27 DIAGNOSIS — G40909 Epilepsy, unspecified, not intractable, without status epilepticus: Secondary | ICD-10-CM | POA: Insufficient documentation

## 2021-04-27 DIAGNOSIS — D420 Neoplasm of uncertain behavior of cerebral meninges: Secondary | ICD-10-CM | POA: Insufficient documentation

## 2021-04-28 ENCOUNTER — Ambulatory Visit: Payer: Medicaid Other | Admitting: Internal Medicine

## 2021-04-28 ENCOUNTER — Other Ambulatory Visit: Payer: Self-pay

## 2021-04-28 ENCOUNTER — Inpatient Hospital Stay (HOSPITAL_BASED_OUTPATIENT_CLINIC_OR_DEPARTMENT_OTHER): Payer: Medicare Other | Admitting: Internal Medicine

## 2021-04-28 ENCOUNTER — Inpatient Hospital Stay: Payer: Medicare Other | Admitting: Internal Medicine

## 2021-04-28 VITALS — BP 149/83 | HR 75 | Temp 97.4°F | Resp 19 | Ht 63.0 in | Wt 159.7 lb

## 2021-04-28 DIAGNOSIS — Z853 Personal history of malignant neoplasm of breast: Secondary | ICD-10-CM | POA: Diagnosis not present

## 2021-04-28 DIAGNOSIS — R569 Unspecified convulsions: Secondary | ICD-10-CM | POA: Diagnosis not present

## 2021-04-28 DIAGNOSIS — Z79899 Other long term (current) drug therapy: Secondary | ICD-10-CM | POA: Diagnosis not present

## 2021-04-28 DIAGNOSIS — D420 Neoplasm of uncertain behavior of cerebral meninges: Secondary | ICD-10-CM | POA: Diagnosis present

## 2021-04-28 DIAGNOSIS — Z923 Personal history of irradiation: Secondary | ICD-10-CM | POA: Diagnosis not present

## 2021-04-28 DIAGNOSIS — G40909 Epilepsy, unspecified, not intractable, without status epilepticus: Secondary | ICD-10-CM | POA: Diagnosis not present

## 2021-04-28 MED ORDER — LAMOTRIGINE 25 MG PO TABS: ORAL_TABLET | ORAL | 0 refills | Status: DC

## 2021-04-28 MED ORDER — LAMOTRIGINE 100 MG PO TABS: 100.0000 mg | ORAL_TABLET | Freq: Every day | ORAL | 3 refills | Status: DC

## 2021-04-28 NOTE — Progress Notes (Signed)
[facility] at [place], [place] [address] [phone]   Interval Evaluation  Date of Service: 04/28/21 Patient Name: Lori Mcmillan Patient MRN: [id number] Patient DOB: 1961/02/07 Provider: Ventura Sellers, MD  Identifying Statement:  Lori Mcmillan is a 60 y.o. 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 Isidore Moos  Interval History:  Lori Mcmillan presents today for follow up after recent MRI brain.  She feels considerably improved with regards to her neuropathic pain syndrome, post herpetic.  No longer using or requiring gabapentin.  She has also stopped Keppra, as she wasn't clear this needed to be continued, and it caused agitation.  Otherwise no new or progressive deficits.  Fortunately no seizure events despite lack of drug over past few weeks.  Prior (05/03/18): Completed radiation at the end of March.  She has MRI brain for review today.  She describes no new neurologic deficits.  Does have frequent moderate headaches, has been taking Tylenol daily for the past 3 months.  Continues to have double vision which limits her ability to drive, read, perform some ADLs.  She has noted history of grade I breast cancer treated with radiation several years ago.  Medications: Current Outpatient Medications on File Prior to Visit  Medication Sig Dispense Refill   amitriptyline (ELAVIL) 25 MG tablet TAKE 1 TABLET BY MOUTH EVERYDAY AT BEDTIME 90 tablet 1   Capsaicin 0.033 % CREA Apply 1 application topically 4 (four) times daily as needed. 56.6 g 0   diazepam (VALIUM) 2 MG tablet Take by mouth.     gabapentin (NEURONTIN) 300 MG capsule Take 2 capsules (600 mg total) by mouth 3 (three) times daily. 90 capsule 3    levETIRAcetam (KEPPRA) 500 MG tablet Take 1 tablet (500 mg total) by mouth 2 (two) times daily. 60 tablet 3   simethicone (MYLICON) 527 MG chewable tablet Chew 125 mg by mouth every 6 (six) hours as needed for flatulence.     valACYclovir (VALTREX) 1000 MG tablet Take 1 tablet (1,000 mg total) by mouth 3 (three) times daily. 30 tablet 0   [DISCONTINUED] diphenhydrAMINE (BENADRYL) 25 MG tablet Take 25 mg by mouth every 6 (six) hours as needed. (Patient not taking: Reported on 05/06/2020)     No current facility-administered medications on file prior to visit.    Allergies:  Allergies  Allergen Reactions   Lidocaine    Past Medical History:  Past Medical History:  Diagnosis Date   Allergy    Anxiety    occ lorazepam   Atypical meningioma of brain ([place]) 2018/2019   Resected, then RT Feb/Mar 2019.  No sign of residual dz at 04/2018 rad onc f/u and 10/2018 neuro-onc f/u. 03/2019 MRI brain->no resid/no recurrence.   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   Chicken pox    Depression    zoloft in past--?wt gain.   Diplopia    chronic (meningioma-related)   Eustachian tube dysfunction 09/05/2013   History of radiation therapy 02/24/12-04/11/12   right breast/ 45Gy@1 .8Gyx6fx/boost=16Gy@2  Gya53fx.  Latest mammo and u/s 03/2013--normal.   History of radiation therapy 11/30/17- 01/11/18   Right temporal  lobe treated to 55.8 Gy with 31 fx of 1.8 Gy   Migraine    Palpitations    has taken Metoprolol for palpitations in the past.   Seizure ([place])    Taste impairment    s/p radiation therapy   Tobacco dependence 09/05/2013   Past Surgical History:  Past Surgical History:  Procedure Laterality Date   APPENDECTOMY  2008   emergency   BRAIN TUMOR EXCISION  2018   Meningioma   BREAST LUMPECTOMY  01/04/12   right breast   CRANIECTOMY  10/19/2017   at [facility]   Social History:  Social History   Socioeconomic History   Marital status: Single    Spouse name: Not on file   Number of children: Not on file   Years of education: Not on file   Highest education level: Not on file  Occupational History   Not on file  Tobacco Use   Smoking status: Former    Packs/day: 0.50    Pack years: 0.00    Types: Cigarettes    Quit date: 10/17/2017    Years since quitting: 3.5   Smokeless tobacco: Never  Vaping Use   Vaping Use: Never used  Substance and Sexual Activity   Alcohol use: Yes    Alcohol/week: 12.0 standard drinks    Types: 12 Cans of beer per week   Drug use: Not Currently   Sexual activity: Not Currently    Partners: Male  Other Topics Concern   Not on file  Social History Narrative   Marital status/children/pets: Single.  No children.  Lives alone.  Has pets.Orig from [place] in [place].   Education/employment: Bachelor's degree, retired   Engineer, materials:      -smoke alarm in the home:Yes     - wears seatbelt: Yes               Social Determinants of Health   Financial Resource Strain: Not on file  Food Insecurity: Not on file  Transportation Needs: Not on file  Physical Activity: Not on file  Stress: Not on file  Social Connections: Not on file  Intimate Partner Violence: Not on file   Family History:  Family History  Problem Relation Age of Onset   Anesthesia problems Mother    Cancer Mother        breast   Breast cancer Mother    Cancer Maternal Aunt        breast   Cancer Paternal Grandfather        colon   Pancreatic cancer Paternal Grandmother     Review of Systems: Constitutional: Denies fevers, chills or abnormal weight loss Eyes: Double vision Ears, nose, mouth, throat, and face: Denies mucositis or sore throat Respiratory: Denies cough, dyspnea or wheezes Cardiovascular: Denies palpitation, chest discomfort or lower extremity swelling Gastrointestinal:  Denies nausea,  constipation, diarrhea GU: Denies dysuria or incontinence Skin: Denies abnormal skin rashes Neurological: Per HPI Musculoskeletal: Denies joint pain, back or neck discomfort. No decrease in ROM Behavioral/Psych: +anxiety  Physical Exam: Vitals:   04/28/21 1034  BP: (!) 149/83  Pulse: 75  Resp: 19  Temp: (!) 97.4 F (36.3 C)  SpO2: 100%    KPS: 90. General: Alert, cooperative, pleasant, in no acute distress Head: Craniotomy scar noted, dry and intact. EENT: No conjunctival injection or scleral icterus. Oral mucosa moist Lungs: Resp effort normal Cardiac: Regular rate and rhythm Abdomen: Soft, non-distended abdomen Skin: R  T9 dermatomal rash Extremities: No clubbing or edema  Neurologic Exam: Mental Status: Awake, alert, attentive to examiner. Oriented to self and environment. Language is fluent with intact comprehension. Age advanced pyschomotor slowing with 2/3 object recall at 5 minutes. Cranial Nerves: Visual acuity is grossly normal. Visual fields are full. Extra-ocular movements intact with horizontal diplopia on left gaze. No ptosis. Face is symmetric, tongue midline.  Some sensory loss right>left face Motor: Tone and bulk are normal. Power is full in both arms and legs. Reflexes are symmetric, no pathologic reflexes present. Intact finger to nose bilaterally Sensory: Intact to light touch and temperature Gait: Normal and tandem gait is normal.   Labs: I have reviewed the data as listed    Component Value Date/Time   NA 137 01/18/2021 1246   NA 140 11/23/2019 0000   K 3.9 01/18/2021 1246   CL 106 01/18/2021 1246   CO2 23 01/18/2021 1246   GLUCOSE 99 01/18/2021 1246   BUN 15 01/18/2021 1246   BUN 9 11/23/2019 0000   CREATININE 0.57 01/18/2021 1246   CREATININE 0.64 09/18/2013 0900   CALCIUM 8.9 01/18/2021 1246   PROT 7.1 01/18/2021 1246   ALBUMIN 4.2 01/18/2021 1246   AST 18 01/18/2021 1246   ALT 18 01/18/2021 1246   ALKPHOS 76 01/18/2021 1246   BILITOT 0.7  01/18/2021 1246   GFRNONAA >60 01/18/2021 1246   GFRAA 113 11/23/2019 0000   Lab Results  Component Value Date   WBC 6.7 01/18/2021   NEUTROABS 4.4 01/18/2021   HGB 12.5 01/18/2021   HCT 39.2 01/18/2021   MCV 92.5 01/18/2021   PLT 243 01/18/2021   Imaging:  [facility] Clinician Interpretation: I have personally reviewed the CNS images as listed.  My interpretation, in the context of the patient's clinical presentation, is stable disease  MR BRAIN W WO CONTRAST  Result Date: 04/22/2021 CLINICAL DATA:  Atypical meningioma of brain. Brain/CNS neoplasm, assess treatment response. Additional history provided by scanning technologist: History of meningioma with resection. Patient reports recent headaches and unsteadiness. EXAM: MRI HEAD WITHOUT AND WITH CONTRAST TECHNIQUE: Multiplanar, multiecho pulse sequences of the brain and surrounding structures were obtained without and with intravenous contrast. CONTRAST:  38mL GADAVIST GADOBUTROL 1 MMOL/ML IV SOLN COMPARISON:  Prior brain MRI examinations 10/28/2020 and earlier. FINDINGS: Brain: Mild generalized cerebral and cerebellar atrophy. Stable postoperative changes in the right temporal region from prior meningioma resection. Unchanged appearance of a large chronic region of encephalomalacia and gliosis within the right temporal lobe and surrounding chronic blood products. T2/FLAIR hyperintensity within the surrounding right temporal, occipital and parietal white matter has also not significantly changed. No nodular or masslike enhancement at the resection site to suggest recurrent tumor. Unchanged extradural fluid collection deep to the right-sided cranioplasty measuring up to 13 mm in greatest thickness (remeasured on prior). A few small scattered foci of T2/FLAIR hyperintensity elsewhere within the cerebral white matter are nonspecific, but likely reflect minimal chronic small vessel ischemic disease. There is no acute infarct. No midline shift. Vascular:  Expected proximal arterial flow voids. Skull and upper cervical spine: Right temporal cranioplasty. No focal suspicious marrow lesion. Incompletely assessed cervical spondylosis. Sinuses/Orbits: Visualized orbits show no acute finding. Trace bilateral ethmoid sinus mucosal thickening. Other: Right mastoid effusion. IMPRESSION: Stable postoperative changes from prior right temporal region meningioma resection. No evidence of recurrent tumor. Persistent right mastoid effusion. Electronically Signed   By: Kellie Simmering DO   On: 04/22/2021 15:58     Assessment/Plan Atypical meningioma  of brain ([place])  Seizures ([place])  Lori Mcmillan is clinically and radiographically stable today.    Because of poor tolerance of Keppra, we recommended alternated anti-epilepsy agent.    She will begin therapy with Lamictal 25mg  daily x7 days, then 50mg  daily x7 days, then 100mg  daily thereafter.  We counseled her on side effects, she will be vigilant for drug rash.   We will follow up with her via phone in ~6 weeks to assess response to this intervention.  Next MRI scan can be in 1 year.  We appreciate the opportunity to participate in the care of Lori Mcmillan.   All questions were answered. The patient knows to call the clinic with any problems, questions or concerns. No barriers to learning were detected.  I have spent a total of 30 minutes of face-to-face and non-face-to-face time, excluding clinical staff time, preparing to see patient, ordering tests and/or medications, counseling the patient, and independently interpreting results and communicating results to the patient/family/caregiver    Ventura Sellers, MD Medical Director of Neuro-Oncology [facility] at [place] 04/28/21 10:03 AM

## 2021-04-30 ENCOUNTER — Encounter: Payer: Self-pay | Admitting: Family Medicine

## 2021-04-30 ENCOUNTER — Ambulatory Visit (INDEPENDENT_AMBULATORY_CARE_PROVIDER_SITE_OTHER): Payer: Medicare Other | Admitting: Family Medicine

## 2021-04-30 ENCOUNTER — Other Ambulatory Visit: Payer: Self-pay

## 2021-04-30 VITALS — BP 115/74 | HR 75 | Ht 63.0 in | Wt 158.0 lb

## 2021-04-30 DIAGNOSIS — N63 Unspecified lump in unspecified breast: Secondary | ICD-10-CM | POA: Insufficient documentation

## 2021-04-30 NOTE — Progress Notes (Signed)
Lori Mcmillan - 60 y.o. female MRN [id number]  Date of birth: 13-Nov-1960  Subjective No chief complaint on file.   HPI Lori Mcmillan is a 60 year old female here today for initial visit to establish care.  She has a history of breast cancer as well as seizure disorder.  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  She continues to have annual MRIs for surveillance of this.  She is concerned about lump in her right breast.  This area is a little tender.  She noticed this a couple weeks ago.  She did try to schedule a diagnostic mammogram with her regular mammogram center however was told that she needed a an order from a physician.  She denies any fever, chills, axillary pain nipple pain or discharge.  ROS:  A comprehensive ROS was completed and negative except as noted per HPI  Allergies  Allergen Reactions   Lidocaine     Past Medical History:  Diagnosis Date   Allergy    Anxiety    occ lorazepam   Atypical meningioma of brain ([place]) 2018/2019   Resected, then RT Feb/Mar 2019.  No sign of residual dz at 04/2018 rad onc f/u and 10/2018 neuro-onc f/u. 03/2019 MRI brain->no resid/no recurrence.   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   Chicken pox    Depression    zoloft in past--?wt gain.   Diplopia    chronic (meningioma-related)   Eustachian tube dysfunction 09/05/2013   History of radiation therapy 02/24/12-04/11/12   right breast/ 45Gy@1 .8Gyx75fx/boost=16Gy@2  Gya80fx.  Latest mammo and u/s 03/2013--normal.   History of radiation therapy 11/30/17- 01/11/18   Right temporal lobe treated to 55.8 Gy with 31 fx of 1.8 Gy   Migraine    Palpitations    has taken Metoprolol for palpitations in the past.   Seizure ([place])    Taste impairment    s/p radiation therapy   Tobacco dependence  09/05/2013    Past Surgical History:  Procedure Laterality Date   APPENDECTOMY  2008   emergency   BRAIN TUMOR EXCISION  2018   Meningioma   BREAST LUMPECTOMY  01/04/12   right breast   CRANIECTOMY  10/19/2017   at [facility]    Social History   Socioeconomic History   Marital status: Single    Spouse name: Not on file   Number of children: Not on file   Years of education: Not on file   Highest education level: Not on file  Occupational History   Not on file  Tobacco Use   Smoking status: Former    Packs/day: 0.50    Types: Cigarettes    Quit date: 10/17/2017    Years since quitting: 3.5   Smokeless tobacco: Never  Vaping Use   Vaping Use: Never used  Substance and Sexual Activity   Alcohol use: Yes    Alcohol/week: 12.0 standard drinks    Types: 12 Cans of beer per week   Drug use: Not Currently   Sexual activity: Not Currently    Partners: Male  Other Topics Concern   Not on file  Social History Narrative   Marital status/children/pets: Single.  No children.  Lives alone.  Has pets.Orig from [place] in [place].  Education/employment: Bachelor's degree, retired   Engineer, materials:      -smoke alarm in the home:Yes     - wears seatbelt: Yes               Social Determinants of Health   Financial Resource Strain: Not on file  Food Insecurity: Not on file  Transportation Needs: Not on file  Physical Activity: Not on file  Stress: Not on file  Social Connections: Not on file    Family History  Problem Relation Age of Onset   Anesthesia problems Mother    Cancer Mother        breast   Breast cancer Mother    Cancer Maternal Aunt        breast   Cancer Paternal Grandfather        colon   Pancreatic cancer Paternal Grandmother     Health Maintenance  Topic Date Due   Pneumococcal Vaccine 65-54 Years old (1 - PCV) Never done   HIV Screening  Never done   Zoster Vaccines- Shingrix (1 of 2) Never done   PAP  SMEAR-Modifier  Never done   COLONOSCOPY (Pts 45-72yrs Insurance coverage will need to be confirmed)  09/07/2018   COVID-19 Vaccine (3 - Pfizer risk series) 02/25/2020   INFLUENZA VACCINE  05/18/2021   MAMMOGRAM  11/19/2021   TETANUS/TDAP  03/05/2029   Hepatitis C Screening  Completed   HPV VACCINES  Aged Out     ----------------------------------------------------------------------------------------------------------------------------------------------------------------------------------------------------------------- Physical Exam BP 115/74   Pulse 75   Ht 5\' 3"  (1.6 m)   Wt 158 lb (71.7 kg)   LMP 05/11/2013   BMI 27.99 kg/m   Physical Exam Constitutional:      Appearance: Normal appearance.  Eyes:     General: No scleral icterus. Cardiovascular:     Rate and Rhythm: Normal rate and regular rhythm.  Pulmonary:     Effort: Pulmonary effort is normal.     Breath sounds: Normal breath sounds.  Chest:       Comments: Palpable area along the right upper inner quadrant of the breast with mild tenderness. Neurological:     General: No focal deficit present.     Mental Status: She is alert.  Psychiatric:        Mood and Affect: Mood normal.        Behavior: Behavior normal.    ------------------------------------------------------------------------------------------------------------------------------------------------------------------------------------------------------------------- Assessment and Plan  Breast lump in upper inner quadrant Palpable breast lump noted in the upper inner quadrant of the right breast.  Diagnostic mammogram ordered.  Can add ultrasound if needed.   No orders of the defined types were placed in this encounter.   No follow-ups on file.    This visit occurred during the SARS-CoV-2 public health emergency.  Safety protocols were in place, including screening questions prior to the visit, additional usage of staff PPE, and extensive cleaning  of exam room while observing appropriate contact time as indicated for disinfecting solutions.

## 2021-04-30 NOTE — Patient Instructions (Signed)
Great to meet you today! We'll get order for mammogram faxed to [facility].  Schedule a visit for a physical with pap.

## 2021-04-30 NOTE — Assessment & Plan Note (Signed)
Palpable breast lump noted in the upper inner quadrant of the right breast.  Diagnostic mammogram ordered.  Can add ultrasound if needed.

## 2021-05-12 ENCOUNTER — Ambulatory Visit: Payer: Medicare Other | Admitting: Family Medicine

## 2021-05-13 ENCOUNTER — Other Ambulatory Visit: Payer: Self-pay

## 2021-05-19 ENCOUNTER — Ambulatory Visit: Payer: Self-pay | Admitting: Surgery

## 2021-05-21 ENCOUNTER — Encounter: Payer: Medicare Other | Admitting: Family Medicine

## 2021-05-21 ENCOUNTER — Inpatient Hospital Stay
Admission: RE | Admit: 2021-05-21 | Discharge: 2021-05-21 | Disposition: A | Discharge status: Home / Self Care | Payer: Self-pay | Source: Ambulatory Visit | Attending: Radiation Oncology | Admitting: Radiation Oncology

## 2021-05-21 ENCOUNTER — Telehealth: Payer: Self-pay | Admitting: Hematology and Oncology

## 2021-05-21 ENCOUNTER — Other Ambulatory Visit: Payer: Self-pay | Admitting: Radiation Oncology

## 2021-05-21 ENCOUNTER — Ambulatory Visit
Admission: RE | Admit: 2021-05-21 | Discharge: 2021-05-21 | Disposition: A | Discharge status: Home / Self Care | Payer: Self-pay | Source: Ambulatory Visit | Attending: Radiation Oncology | Admitting: Radiation Oncology

## 2021-05-21 DIAGNOSIS — Z171 Estrogen receptor negative status [ER-]: Secondary | ICD-10-CM

## 2021-05-21 DIAGNOSIS — C50411 Malignant neoplasm of upper-outer quadrant of right female breast: Secondary | ICD-10-CM

## 2021-05-21 NOTE — Telephone Encounter (Signed)
Received a new pt referral from Dr. Brantley Stage for genetics and medonnc. Ms. Lori Mcmillan has been cld and scheduled to see Raquel Sarna on 8/9 at 2pm and Dr. Lindi Adie on 8/11 at 345pm.

## 2021-05-22 NOTE — Progress Notes (Signed)
Location of Breast Cancer:  Malignant neoplasm of upper-outer quadrant of RIGHT breast, estrogen receptor negative   Histology per Pathology Report:  (Definitive pathology pending upcoming surgery) 05/13/2021 Diagnosis 1. Breast, right, needle core biopsy, 1 o'clock, 5cmfn - INVASIVE DUCTAL CARCINOMA. SEE NOTE 2. Lymph node, needle/core biopsy, right axilla - BENIGN FIBROADIPOSE TISSUE - LYMPHOID TISSUE IS NOT IDENTIFIED  Receptor Status: ER(0%), PR (0%), Her2-neu (Positive), Ki-67(30%)  Did patient present with symptoms (if so, please note symptoms) or was this found on screening mammography?:  Patient has a history of right breast cancer stage I diagnosed in 2013 treated with lumpectomy, sentinel lymph node mapping and radiation therapy. She received tamoxifen but did not stay on it. 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 (History of a brain tumor and has had radiation therapy to that. She has a strong family history of breast cancer as well. Also strong family history of melanoma and pancreatic cancer she states)  Past/Anticipated interventions by surgeon, if any:  Dr. Erroll Luna (office visit) 05/19/2021 --Discussed mastectomy with reconstruction, breast conserving surgery and the pros and cons of each.  --Standard of care would be completion mastectomy on the right which I discussed with her but also reviewed data on repeat lumpectomy after radiation therapy for recurrent and or new breast cancer --It is small and amendable to lumpectomy but I discussed potential recurrent rates given the fact she said previous radiation without any additional radiation therapy --She is very strongly motivated to keep her breast. May be best to consider neoadjuvant chemotherapy given her HER2/neu positive status if breast conserving surgery is  still something she feels strongly about after discussion with medical and radiation oncology  Past/Anticipated interventions by medical oncology, if any:  **Scheduled for consult with Dr. Nicholas Lose on 05/28/2021  Under care of Dr. 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   --No longer using or requiring gabapentin.   --She has also stopped Keppra, as she wasn't clear this needed to be continued, and it caused agitation.   --Otherwise no new or progressive deficits.   --Fortunately no seizure events despite lack of drug over past few weeks. --Because of poor tolerance of Keppra, we recommended alternated anti-epilepsy agent **She will begin therapy with Lamictal $RemoveBefor'25mg'diqJCsMIpUHo$  daily x7 days, then $RemoveBe'50mg'CorXMHfjI$  daily x7 days, then $RemoveBe'100mg'vTMPasuHs$  daily thereafter (current dose) --We will follow up with her via phone in ~6 weeks to assess response to this intervention.  Lymphedema issues, if any:  Patient denies    Pain issues, if any:  Reports manageable soreness/tenderness to right breast. Denies any range of motion limitations.   SAFETY ISSUES: Prior radiation? Yes  Right temporal lobe treated with 55.8 Gy in 31 fractions of 1.8 Gy on 11/30/17 to 01/11/18 Right breast / 45 Gray at 1.8 [place] per fraction x 25 fractions Right breast boost / 16 Gray at [employer] per fraction x 8 fractions Pacemaker/ICD? No Possible current pregnancy? No--postmenopausal Is the patient on methotrexate? No  Current Complaints / other details:  Patient has received the first 2 Pfizer vaccines

## 2021-05-25 NOTE — Progress Notes (Signed)
Radiation Oncology         [phone] ________________________________  Outpatient Re-Consultation by telephone.  The patient opted for telemedicine to maximize safety during the pandemic.  MyChart video was not obtainable.   Name: Lori Mcmillan MRN: [id number]  Date: 05/26/2021  DOB: 1961-02-09  HT:[id number], [name], DO  Erroll Luna, MD  Lauree Chandler, MD Nicholas Lose MD  REFERRING PHYSICIAN: Erroll Luna, MD  DIAGNOSIS:    ICD-10-CM   1. Malignant neoplasm of upper-outer quadrant of right breast in female, estrogen receptor negative ([place])  C50.411    Z17.1     Cancer Staging Cancer of upper-outer quadrant of female breast [facility]) Staging form: Breast, AJCC 7th Edition - Pathologic: No stage assigned - Unsigned Specimen type: Core Needle Biopsy Laterality: Right - Clinical stage from 05/26/2021: Stage Unknown (T1c, NX, cM0) - Signed by Eppie Gibson, MD on 05/28/2021 Specimen type: Core Needle Biopsy Stage prefix: Initial diagnosis Laterality: Right Tumor grade (Scarff-Bloom-Richardson system): G3 Estrogen receptor status: Negative Progesterone receptor status: Negative HER2 status: Positive Staging comments: Staged at Breast Cancer Conference 12/08/11     CHIEF COMPLAINT: Here to discuss management of new right breast cancer   HISTORY OF PRESENT ILLNESS::Lori Mcmillan is a 60 y.o. female treated by Dr. Remonia Richter with post lumpectomy RT in 2013 for ER+ right breast cancer previously. She was treated by me for Grade II right temporal meningioma in 2019.  --Stage I right breast cancer diagnosed in 2013; 02/24/2012-04/11/2012 Site/dose:    Right breast / 45 Gray at 1.8 [place] per fraction x 25 fractions Right breast boost / 16 Gray at [employer] per fraction x 8 fractions NO chemotherapy and she declined anti-estrogen tx, though tumor was 100% ER+/100% PR+  --Meningioma of right temporal lobe; 11/30/17 to 01/11/18.  Site/dose: Right temporal lobe treated with 55.8 Gy in 31  fractions of 1.8 Gy    The patient recently presented with breast abnormality on the following imaging: diagnostic bilateral mammogram on the date of 05/04/21 which revealed an indeterminate 0.8 cm mass in the right breast. Symptoms, if any, at that time were: a palpable right breast lump.   Right breast ultrasound on 05/04/21 demonstrated a 0.9 x 1.1 x 0.8 cm irregular mass in the right breast at 1 o'clock, middle depth; noted to be highly suggestive of malignancy. Suspicious axillary node also noted.      Right breast and right axillary node biopsy on 05/13/21 revealed: grade 3 right breast invasive ductal carcinoma (1 o'clock, 5cmfn), and benign fibroadipose tissue from biopsy of right axillary node biopsy. Carcinoma noted to measure 0.6 cm in the greatest linear dimension. Prognostic indicators significant for: ER & PR 0%, negative; Her2 status positive; Ki67 30%.  (Note that lymphoid tissue was not obtained from the axillary bx and I am not convinced this is diagnostic).  Since her last follow-up visit here (05/03/2018), she underwent the following imaging (dates and results as follows):  --MRI of the brain on 04/02/2019 which demonstrated no evidence of recurrent tumor, or acute intracranial abnormality (chronic changes were noted related to right temporal meningioma resection).  --Diagnostic bilateral mammogram on 04/04/2019 at [facility] which showed a 0.6 cm group of round and vascular calcifications in the right breast; noted to be likely benign. --MRI of the brain on 10/28/20 which revealed the stable appearance of postsurgical changes in the right temporal region with a large area of encephalomalacia and gliosis in the right temporal lobe; no evidence of residual/ recurrent lesion was  otherwise noted. --MRI of the brain on 04/22/21 which again demonstrated no evidence of recurrent tumor.  The patient was referred to Dr. Brantley Stage for consultation on 05/19/21. During this visit, surgical  interventions were discussed with the patient. Per the visit note, patient stated that she is very strongly motivated to keep her breast. Considering this and the patients history of prior radiation therapies and Her2 status, Dr. Brantley Stage stated that it may be best to peruse neoadjuvant chemotherapy if breast conserving surgery is still something she feels strongly about after discussion with medical and radiation oncology.   PREVIOUS RADIATION THERAPY: Yes --Meningioma of right temporal lobe; 11/30/17 to 01/11/18.  Site/dose: Right temporal lobe treated with 55.8 Gy in 31 fractions of 1.8 Gy  --Stage I right breast cancer diagnosed in 2013; 02/24/2012-04/11/2012 Site/dose:    Right breast / 57 Gray at 1.8 [place] per fraction x 25 fractions Right breast boost / 16 Gray at [employer] per fraction x 8 fractions NO chemotherapy and she declined anti-estrogen tx, though tumor was 100%ER+/100% PR+  PAST MEDICAL HISTORY:  has a past medical history of Allergy, Anxiety, Atypical meningioma of brain ([place]) (2018/2019), Brain embolism and thrombosis, Breast cancer ([place]), Chicken pox, Depression, Diplopia, Eustachian tube dysfunction (09/05/2013), Family history of breast cancer, Family history of colon cancer, Family history of melanoma, Family history of ovarian cancer, Family history of pancreatic cancer, History of radiation therapy (02/24/12-04/11/12), History of radiation therapy (11/30/17- 01/11/18), basal cell carcinoma, Migraine, Palpitations, Seizure ([place]), Taste impairment, and Tobacco dependence (09/05/2013).    PAST SURGICAL HISTORY: Past Surgical History:  Procedure Laterality Date   APPENDECTOMY  2008   emergency   BRAIN TUMOR EXCISION  2018   Meningioma   BREAST LUMPECTOMY  01/04/12   right breast   CRANIECTOMY  10/19/2017   at [place]: family history includes Anesthesia problems in her mother; Breast cancer in her cousin; Breast cancer (age of  onset: 65) in her maternal aunt; Breast cancer (age of onset: 85) in her mother; Breast cancer (age of onset: 16) in her maternal aunt; Cancer in her maternal aunt and paternal uncle; Colon cancer (age of onset: 56) in her cousin; Dementia in her maternal grandmother; Heart Problems in her paternal grandfather; Lung cancer in her maternal grandfather and paternal uncle; Melanoma in her paternal grandmother; Ovarian cancer (age of onset: 92) in her maternal aunt; Pancreatic cancer in her maternal uncle; Pancreatic cancer (age of onset: 44) in her paternal grandmother; Stomach cancer in her paternal uncle.  SOCIAL HISTORY:  reports that she quit smoking about 3 years ago. Her smoking use included cigarettes. She smoked an average of .5 packs per day. She has never used smokeless tobacco. She reports current alcohol use of about 6.0 standard drinks per week. She reports that she does not currently use drugs.  ALLERGIES: Lidocaine  MEDICATIONS:  Current Outpatient Medications  Medication Sig Dispense Refill   diazepam (VALIUM) 2 MG tablet Take by mouth.     lamoTRIgine (LAMICTAL) 100 MG tablet Take 1 tablet (100 mg total) by mouth daily. 60 tablet 3   lamoTRIgine (LAMICTAL) 25 MG tablet Take $RemoveBef'25mg'veLciXCZPg$  by mouth in AM x7 days, then take $RemoveB'50mg'eplBZsmv$  by mouth in AM x7 days 21 tablet 0   No current facility-administered medications for this encounter.    REVIEW OF SYSTEMS: As above    PHYSICAL EXAM:  vitals were not taken for this visit.  General: Alert and oriented, in no acute distress     LABORATORY DATA:  Lab Results  Component Value Date   WBC 6.7 01/18/2021   HGB 12.5 01/18/2021   HCT 39.2 01/18/2021   MCV 92.5 01/18/2021   PLT 243 01/18/2021   CMP     Component Value Date/Time   NA 137 01/18/2021 1246   NA 140 11/23/2019 0000   K 3.9 01/18/2021 1246   CL 106 01/18/2021 1246   CO2 23 01/18/2021 1246   GLUCOSE 99 01/18/2021 1246   BUN 15 01/18/2021 1246   BUN 9 11/23/2019 0000    CREATININE 0.57 01/18/2021 1246   CREATININE 0.64 09/18/2013 0900   CALCIUM 8.9 01/18/2021 1246   PROT 7.1 01/18/2021 1246   ALBUMIN 4.2 01/18/2021 1246   AST 18 01/18/2021 1246   ALT 18 01/18/2021 1246   ALKPHOS 76 01/18/2021 1246   BILITOT 0.7 01/18/2021 1246   GFRNONAA >60 01/18/2021 1246   GFRAA 113 11/23/2019 0000         RADIOGRAPHY: as above     IMPRESSION/PLAN:   This is a very nice 60 yo woman with a second case of right breast cancer 9 years after breast conserving surgery and RT.  Previous cancer was ER+, current is ER negative, PR negative, HER2 positive, high grade.  I recommend salvage mastectomy but she is reluctant about this, citing fears that she won't heal from it. I encouraged her to discuss this more w/ her surgeon for reassurance. She is not keen on reconstruction and I think she may be overestimating the risks of healing issues if she undergoes mastectomy.  Given her reluctance to undergo mastectomy I am referring her to [facility] for second opinion.  I spoke w Dr. Ronal Fear who conducts research on breast cancer and is a great resource for atypical cases. I think another insight will help Lori Mcmillan and she was enthusiastic to run her case by [facility].  In summary: I told Lori Mcmillan that mastectomy is standard and recommended by her team, but I would recommend re-irradiation to the breast if she preserves it, given the aggressive histology of her new breast cancer. And, I told her that post mastectomy RT might be indicated if her disease is locally advanced on final staging.  She understands there are heightened risks for fibrosis and injury of normal tissues in the setting of re-irradiation. She appreciated this discussion and will continue to consider her options. She looks forward to seeing Dr. Ronal Fear.  Please note that lymph node biopsy may not be diagnostic given that specimen did not contain lymphoid tissue. Patient and I discussed this, and I will circle back to  Dr. Lindi Adie and [place] about this.  This encounter was provided by telemedicine platform; patient desired telemedicine during pandemic precautions.  MyChart video was not available and therefore telephone was used. The patient has given verbal consent for this type of encounter and has been advised to only accept a meeting of this type in a secure network environment. On date of service, in total, I spent 60 minutes on this encounter.   The attendants for this meeting include Eppie Gibson  and Kathyrn Sheriff During the encounter, Eppie Gibson was located at [facility] Radiation Oncology Department.  Lori Mcmillan was located at home.    __________________________________________   Eppie Gibson, MD  This document serves as a record of services personally performed by Eppie Gibson, MD. It was created on her behalf  by Roney Mans, a trained medical scribe. The creation of this record is based on the scribe's personal observations and the provider's statements to them. This document has been checked and approved by the attending provider.

## 2021-05-26 ENCOUNTER — Inpatient Hospital Stay: Payer: Medicare Other | Attending: Internal Medicine | Admitting: Genetic Counselor

## 2021-05-26 ENCOUNTER — Encounter: Payer: Self-pay | Admitting: Genetic Counselor

## 2021-05-26 ENCOUNTER — Other Ambulatory Visit: Payer: Self-pay

## 2021-05-26 ENCOUNTER — Ambulatory Visit
Admission: RE | Admit: 2021-05-26 | Discharge: 2021-05-26 | Disposition: A | Discharge status: Home / Self Care | Payer: Medicare Other | Source: Ambulatory Visit | Attending: Radiation Oncology | Admitting: Radiation Oncology

## 2021-05-26 ENCOUNTER — Inpatient Hospital Stay: Payer: Medicare Other

## 2021-05-26 ENCOUNTER — Other Ambulatory Visit: Payer: Self-pay | Admitting: Genetic Counselor

## 2021-05-26 ENCOUNTER — Encounter: Payer: Self-pay | Admitting: Radiation Oncology

## 2021-05-26 DIAGNOSIS — Z803 Family history of malignant neoplasm of breast: Secondary | ICD-10-CM | POA: Insufficient documentation

## 2021-05-26 DIAGNOSIS — C50411 Malignant neoplasm of upper-outer quadrant of right female breast: Secondary | ICD-10-CM

## 2021-05-26 DIAGNOSIS — Z853 Personal history of malignant neoplasm of breast: Secondary | ICD-10-CM | POA: Insufficient documentation

## 2021-05-26 DIAGNOSIS — D420 Neoplasm of uncertain behavior of cerebral meninges: Secondary | ICD-10-CM | POA: Insufficient documentation

## 2021-05-26 DIAGNOSIS — Z85828 Personal history of other malignant neoplasm of skin: Secondary | ICD-10-CM | POA: Insufficient documentation

## 2021-05-26 DIAGNOSIS — Z171 Estrogen receptor negative status [ER-]: Secondary | ICD-10-CM | POA: Insufficient documentation

## 2021-05-26 DIAGNOSIS — Z923 Personal history of irradiation: Secondary | ICD-10-CM | POA: Insufficient documentation

## 2021-05-26 DIAGNOSIS — Z800 Family history of malignant neoplasm of digestive organs: Secondary | ICD-10-CM | POA: Insufficient documentation

## 2021-05-26 DIAGNOSIS — Z87891 Personal history of nicotine dependence: Secondary | ICD-10-CM | POA: Insufficient documentation

## 2021-05-26 DIAGNOSIS — Z8041 Family history of malignant neoplasm of ovary: Secondary | ICD-10-CM | POA: Insufficient documentation

## 2021-05-26 DIAGNOSIS — Z79899 Other long term (current) drug therapy: Secondary | ICD-10-CM | POA: Insufficient documentation

## 2021-05-26 DIAGNOSIS — F419 Anxiety disorder, unspecified: Secondary | ICD-10-CM | POA: Insufficient documentation

## 2021-05-26 DIAGNOSIS — Z808 Family history of malignant neoplasm of other organs or systems: Secondary | ICD-10-CM | POA: Insufficient documentation

## 2021-05-26 LAB — GENETIC SCREENING ORDER

## 2021-05-26 NOTE — Progress Notes (Signed)
REFERRING PROVIDER: Erroll Luna, MD [address] [place],  [place] [address]  PRIMARY PROVIDER:  Luetta Nutting, DO  PRIMARY REASON FOR VISIT:  1. Malignant neoplasm of upper-outer quadrant of right female breast, unspecified estrogen receptor status ([facility])   2. 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 Family history of breast cancer   4. Family history of pancreatic cancer   5. Family history of ovarian cancer   6. Family history of colon cancer   7. Family history of melanoma      HISTORY OF PRESENT ILLNESS:   Ms. Dysart, a 60 y.o. female, was seen for a [facility] cancer genetics consultation at the request of Dr. Brantley Stage due to a personal and family history of cancer.  Ms. Ahmad presents to clinic today to discuss the possibility of a hereditary predisposition to cancer, genetic testing, and to further clarify her future cancer risks, as well as potential cancer risks for family members.   In February of 2013, at the age of 51, Ms. 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 The tumor was ER+/PR+/Her2-. The treatment plan included lumpectomy, oncotype, radiation therapy, and antiestrogen therapy. She had negative genetic testing of the BRCA1 and BRCA2 genes in 2013.  In December of 2018, at the age of 89, Ms. Faubert was diagnosed with an atypical meningioma which was surgically resected and treated with radiation therapy.  In 2020, Ms. Reppert had a basal cell carcinoma removed from her left shin.  Most recently, in July of 2022 at the age of 63, Ms. Mcevoy was diagnosed with invasive ductal carcinoma of the right breast. The tumor is ER-/PR-/Her2+.    RISK FACTORS:  Menarche was at age 36.  No live births.  OCP use for approximately  30  years.  Ovaries intact: yes.  Hysterectomy: no.  Menopausal status: postmenopausal.  HRT use: 0 years. Colonoscopy: yes;  2015 . Mammogram within the  last year: yes. Any excessive radiation exposure in the past: radiation therapy for right breast cancer in 2013, radiation therapy for meningioma in 2019.   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  No sign of residual dz at 04/2018 rad onc f/u and 10/2018 neuro-onc f/u. 03/2019 MRI brain->no resid/no recurrence.   Brain embolism and thrombosis    Breast cancer ([facility])    Rt breast; 1.5 cm low grade invasive ductal carcinoma status post lumpectomy with sentinel node biopsy on 01/04/2012.   Chicken pox    Depression    zoloft in past--?wt gain.   Diplopia    chronic (meningioma-related)   Eustachian tube dysfunction 09/05/2013   Family history of breast cancer    Family history of colon cancer    Family history of melanoma    Family history of ovarian cancer    Family history of pancreatic cancer    History of radiation therapy 02/24/12-04/11/12   right breast/ 45Gy$RemoveBeforeDEI'@1'qFhrltMbBeZELmes$ .8Gyx47fx/boost=16Gy$RemoveBef'@2'zrXtIolnMD$  Gya35fx.  Latest mammo and u/s 03/2013--normal.   History of radiation therapy 11/30/17- 01/11/18   Right temporal lobe treated to 55.8 Gy with 31 fx of 1.8 Gy   Hx of basal cell carcinoma    Migraine    Palpitations    has taken Metoprolol for palpitations in the past.   Seizure ([facility])    Taste impairment  s/p radiation therapy   Tobacco dependence 09/05/2013    Past Surgical History:  Procedure Laterality Date   APPENDECTOMY  2008   emergency   BRAIN TUMOR EXCISION  2018   Meningioma   BREAST LUMPECTOMY  01/04/12   right breast   CRANIECTOMY  10/19/2017   at [facility]    Social History   Socioeconomic History   Marital status: Single    Spouse name: Not on file   Number of children: Not on file   Years of education: Not on file   Highest education level: Not on file  Occupational History   Not on file  Tobacco Use   Smoking status:  Former    Packs/day: 0.50    Types: Cigarettes    Quit date: 10/17/2017    Years since quitting: 3.6   Smokeless tobacco: Never  Vaping Use   Vaping Use: Never used  Substance and Sexual Activity   Alcohol use: Yes    Alcohol/week: 6.0 standard drinks    Types: 3 Glasses of wine, 3 Cans of beer per week   Drug use: Not Currently   Sexual activity: Not Currently    Partners: Male  Other Topics Concern   Not on file  Social History Narrative   Marital status/children/pets: Single.  No children.  Lives alone.  Has pets.Orig from [place] in [place].   Education/employment: Bachelor's degree, retired   Engineer, materials:      -smoke alarm in the home:Yes     - wears seatbelt: Yes               Social Determinants of Health   Financial Resource Strain: Not on file  Food Insecurity: Not on file  Transportation Needs: Not on file  Physical Activity: Not on file  Stress: Not on file  Social Connections: Not on file     FAMILY HISTORY:  We obtained a detailed, 4-generation family history.  Significant diagnoses are listed below: Family History  Problem Relation Age of Onset   Anesthesia problems Mother    Breast cancer Mother 62       lumpectomy, chemo, radiation   Ovarian cancer Maternal Aunt 70   Cancer Maternal Aunt        unknown type   Breast cancer Maternal Aunt 80   Breast cancer Maternal Aunt 40       bilateral   Pancreatic cancer Maternal Uncle        dx 31s   Stomach cancer Paternal Uncle    Cancer Paternal Uncle        unknown type   Lung cancer Paternal Uncle        hx smoking   Dementia Maternal Grandmother    Lung cancer Maternal Grandfather        hx smoking   Pancreatic cancer Paternal Grandmother 75   Melanoma Paternal Grandmother        dx >50, shin   Heart Problems Paternal Grandfather    Colon cancer Cousin 6       maternal first cousin   Breast cancer Cousin        dx 107s, paternal first cousin   Ms. Kleinsasser does not have children. She has one  brother (age 84) and one sister (age 5). Neither of her siblings have had cancer.  Ms. Stepanek's mother is alive at age 34 and had breast cancer at age 28. There were five maternal aunts and two maternal  uncles. One aunt had ovarian cancer at age 23, one aunt died from an unknown cancer in her 57s, two aunts had breast cancer - one at age 56 and the other in her 88s. One uncle died from pancreatic cancer in his 59s. A maternal cousin had colon cancer at age 41. Ms. Courtney's maternal grandmother died in her 58s without cancer. Her maternal grandfather died older than 33 with lung cancer.  Ms. Oyervides's father died at age 30 without cancer. There was one paternal aunt and five paternal uncles (all half-brothers to her father). One uncle had stomach cancer, one uncle had lung cancer, and one uncle had an unknown cancer. One paternal cousin had breast cancer in her 50s. Ms. Mott's paternal grandmother died at age 26 from pancreatic cancer and also had melanoma diagnosed older than 53. Her paternal grandfather died older than 14 without cancer.   Ms. Billard is unaware of previous family history of genetic testing for hereditary cancer risks. Patient's maternal ancestors are of [place] and [place] descent, and paternal ancestors are of [place] and [place] descent. There is no reported Ashkenazi Jewish ancestry. There is no known consanguinity.  GENETIC COUNSELING ASSESSMENT: Ms. Bugay is a 60 y.o. female with a personal and family history of cancer which is somewhat suggestive of a hereditary cancer syndrome and predisposition to cancer. We, therefore, discussed and recommended the following at today's visit.   DISCUSSION:  We discussed that, in general, most cancer is not inherited in families, but instead is sporadic or familial. Sporadic cancers occur by chance and typically happen at older ages (>50 years) as this type of cancer is caused by genetic changes acquired during an individual's  lifetime. Some families have more cancers than would be expected by chance; however, the ages or types of cancer are not consistent with a known genetic mutation or known genetic mutations have been ruled out. This type of familial cancer is thought to be due to a combination of multiple genetic, environmental, hormonal, and lifestyle factors. While this combination of factors likely increases the risk of cancer, the exact source of this risk is not currently identifiable or testable.    We discussed that approximately 5-10% of cancer is hereditary, meaning that it is due to a mutation in a single gene that is passed down from generation to generation in a family. Most hereditary cases of bresat cancer are associated with the BRCA1 and BRCA2 genes. Ms. Ivie had negative genetic testing of the BRCA1/2 genes in 2013; however, there are other genes that can be associated an increased risk for breast cancer, including ATM, CHEK2, PALB2, etc. We discussed that testing can be beneficial for several reasons, including knowing about other cancer risks, identifying potential screening and risk-reduction options that may be appropriate, and to understand if other family members could be at risk for cancer and allow them to undergo genetic testing.   We reviewed the characteristics, features and inheritance patterns of hereditary cancer syndromes. We also discussed genetic testing, including the appropriate family members to test, the process of testing, insurance coverage and turn-around-time for results. We discussed the implications of a negative, positive and/or variant of uncertain significant result. We recommended Ms. Meriwether pursue genetic testing for the Multi-Cancer + RNA panel.   The Multi-Cancer + RNA Panel offered by [facility] includes sequencing and/or deletion/duplication analysis of the following 84 genes:  AIP*, ALK, APC*, ATM*, AXIN2*, BAP1*, BARD1*, BLM*, BMPR1A*, BRCA1*, BRCA2*, BRIP1*, CASR,  CDC73*, CDH1*, CDK4, CDKN1B*, CDKN1C*, CDKN2A,  CEBPA, CHEK2*, CTNNA1*, DICER1*, DIS3L2*, EGFR, EPCAM, FH*, FLCN*, GATA2*, GPC3, GREM1, HOXB13, HRAS, KIT, MAX*, MEN1*, MET, MITF, MLH1*, MSH2*, MSH3*, MSH6*, MUTYH*, NBN*, NF1*, NF2*, NTHL1*, PALB2*, PDGFRA, PHOX2B, PMS2*, POLD1*, POLE*, POT1*, PRKAR1A*, PTCH1*, PTEN*, RAD50*, RAD51C*, RAD51D*, RB1*, RECQL4, RET, RUNX1*, SDHA*, SDHAF2*, SDHB*, SDHC*, SDHD*, SMAD4*, SMARCA4*, SMARCB1*, SMARCE1*, STK11*, SUFU*, TERC, TERT, TMEM127*, Tp53*, TSC1*, TSC2*, VHL*, WRN*, and WT1.  RNA analysis is performed for * genes.   Based on Ms. Witherington's personal and family history of cancer, she meets medical criteria for genetic testing. Despite that she meets criteria, there may still be an out of pocket cost.   PLAN: After considering the risks, benefits, and limitations, Ms. Cayson provided informed consent to pursue genetic testing and the blood sample was sent to [facility] for analysis of the Multi-Cancer + RNA panel. Results should be available within approximately two-three weeks' time, at which point they will be disclosed by telephone to Ms. Putman, as will any additional recommendations warranted by these results. Ms. Perham will receive a summary of her genetic counseling visit and a copy of her results once available. This information will also be available in Epic.   Ms. Erck questions were answered to her satisfaction today. Our contact information was provided should additional questions or concerns arise. Thank you for the referral and allowing [place] to share in the care of your patient.   Clint Guy, [place], [facility] Licensed, Certified Dispensing [email or web address] Phone: [phone]  The patient was seen for a total of 40 minutes in face-to-face genetic counseling. Patient was seen alone. This patient was discussed with Drs. Magrinat, Lindi Adie and/or Burr Medico who agrees with the above.     _______________________________________________________________________ For Office Staff:  Number of people involved in session: 1 Was an Intern/ student involved with case: no

## 2021-05-27 NOTE — Progress Notes (Signed)
[facility] NOTE  Patient Care Team: Luetta Nutting, DO as PCP - General (Family Medicine) Eppie Gibson, MD as Consulting Physician (Radiation Oncology) Mickeal Skinner Acey Lav, MD as Consulting Physician (Oncology) [facility] [place] (Ophthalmology) Bridget Hartshorn, Noel Journey, MD as Referring Physician (Dermatology) Helayne Seminole, MD as Referring Physician (Otolaryngology)  CHIEF COMPLAINTS/PURPOSE OF CONSULTATION:  Newly diagnosed right breast cancer  HISTORY OF PRESENTING ILLNESS:  Lori Mcmillan 60 y.o. female is here because of recent diagnosis of invasive ductal carcinoma of the right breast. She has a history of right breast cancer treated with lumpectomy in 2013. She palpated an abnormality in her right breast. Diagnostic mammogram and [place] on 05/04/21 showed two small asymmetries in the left breast, and a 0.8 cm irregular mass in the right breast. Biopsy on 05/13/21 showed invasive ductal carcinoma in the right breast with benign fibroadipose tissue in the right axillary lymph node; Her2+ (3+), ER/PR-. She presents to the clinic today for initial evaluation and discussion of treatment options.   I reviewed her records extensively and collaborated the history with the patient.  SUMMARY OF ONCOLOGIC HISTORY: Oncology History  Cancer of upper-outer quadrant of female breast ([facility])  2013 Initial Diagnosis   Right breast cancer: Stage Ia ER/PR positive HER2 negative status postlumpectomy, radiation (low risk Oncotype) could not tolerate tamoxifen for more than 30 days.   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  Biopsy revealed grade 3 IDC ER/PR negative HER2 positive with a Ki-67 of 30%   05/26/2021 Cancer Staging   Staging form: Breast,  AJCC 7th Edition - Clinical stage from 05/26/2021: Stage IA (T1c, N0, cM0) - Signed by Eppie Gibson, MD on 05/28/2021 Specimen type: Core Needle Biopsy Stage prefix: Initial diagnosis Laterality: Right Tumor grade (Scarff-Bloom-Richardson system): G3 Estrogen receptor status: Negative Progesterone receptor status: Negative HER2 status: Positive Staging comments: Staged at Breast Cancer Conference 12/08/11        MEDICAL HISTORY:  Past Medical History:  Diagnosis Date   Allergy    Anxiety    occ lorazepam   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  No sign of residual dz at 04/2018 rad onc f/u and 10/2018 neuro-onc f/u. 03/2019 MRI brain->no resid/no recurrence.   Brain embolism and thrombosis    Breast cancer ([facility])    Rt breast; 1.5 cm low grade invasive ductal carcinoma status post lumpectomy with sentinel node biopsy on 01/04/2012.   Chicken pox    Depression    zoloft in past--?wt gain.   Diplopia    chronic (meningioma-related)   Eustachian tube dysfunction 09/05/2013   Family history of breast cancer    Family history of colon cancer    Family history of melanoma    Family history of ovarian cancer    Family history of pancreatic cancer    History of radiation therapy 02/24/12-04/11/12   right breast/ 45Gy$RemoveBeforeDEI'@1'iwsagrkKlBvFaauW$ .8Gyx39fx/boost=16Gy$RemoveBef'@2'xSLKwgLlvj$  Gya51fx.  Latest mammo and u/s 03/2013--normal.   History of radiation therapy 11/30/17- 01/11/18   Right temporal lobe treated to 55.8 Gy with 31 fx of 1.8 Gy   Hx of basal cell carcinoma    Migraine    Palpitations    has taken Metoprolol for palpitations in the past.  Seizure ([facility])    Taste impairment    s/p radiation therapy   Tobacco dependence 09/05/2013    SURGICAL HISTORY: Past Surgical History:  Procedure Laterality Date   APPENDECTOMY  2008   emergency   BRAIN TUMOR EXCISION  2018   Meningioma   BREAST LUMPECTOMY  01/04/12   right breast   CRANIECTOMY  10/19/2017   at [facility] HISTORY: Social History   Socioeconomic History   Marital status: Single    Spouse name: Not on file   Number of children: Not on file   Years of education: Not on file   Highest education level: Not on file  Occupational History   Not on file  Tobacco Use   Smoking status: Former    Packs/day: 0.50    Types: Cigarettes    Quit date: 10/17/2017    Years since quitting: 3.6   Smokeless tobacco: Never  Vaping Use   Vaping Use: Never used  Substance and Sexual Activity   Alcohol use: Yes    Alcohol/week: 6.0 standard drinks    Types: 3 Glasses of wine, 3 Cans of beer per week   Drug use: Not Currently   Sexual activity: Not Currently    Partners: Male  Other Topics Concern   Not on file  Social History Narrative   Marital status/children/pets: Single.  No children.  Lives alone.  Has pets.Orig from [place] in [place].   Education/employment: Bachelor's degree, retired   Engineer, materials:      -smoke alarm in the home:Yes     - wears seatbelt: Yes               Social Determinants of Health   Financial Resource Strain: Not on file  Food Insecurity: Not on file  Transportation Needs: Not on file  Physical Activity: Not on file  Stress: Not on file  Social Connections: Not on file  Intimate Partner Violence: Not on file    FAMILY HISTORY: Family History  Problem Relation Age of Onset   Anesthesia problems Mother    Breast cancer Mother 24       lumpectomy, chemo, radiation   Ovarian cancer Maternal Aunt 52   Cancer Maternal Aunt        unknown type   Breast cancer Maternal Aunt 80   Breast cancer Maternal Aunt 40       bilateral   Pancreatic cancer Maternal Uncle        dx 81s   Stomach cancer Paternal Uncle    Cancer Paternal Uncle        unknown type   Lung cancer Paternal Uncle        hx smoking   Dementia Maternal Grandmother    Lung cancer Maternal Grandfather        hx smoking   Pancreatic cancer Paternal Grandmother 24    Melanoma Paternal Grandmother        dx >50, shin   Heart Problems Paternal Grandfather    Colon cancer Cousin 75       maternal first cousin   Breast cancer Cousin        dx 77s, paternal first cousin    ALLERGIES:  is allergic to lidocaine.  MEDICATIONS:  Current Outpatient Medications  Medication Sig Dispense Refill   diazepam (VALIUM) 2 MG tablet Take by mouth.     lamoTRIgine (LAMICTAL) 100 MG tablet Take 1 tablet (100 mg  total) by mouth daily. 60 tablet 3   lamoTRIgine (LAMICTAL) 25 MG tablet Take $RemoveBef'25mg'vbdDyEqirR$  by mouth in AM x7 days, then take $RemoveB'50mg'mCmljPTS$  by mouth in AM x7 days 21 tablet 0   No current facility-administered medications for this visit.    REVIEW OF SYSTEMS:     All other systems were reviewed with the patient and are negative.  PHYSICAL EXAMINATION: ECOG PERFORMANCE STATUS: 1 - Symptomatic but completely ambulatory  Vitals:   05/28/21 1552  BP: (!) 173/85  Pulse: 87  Resp: 18  Temp: 97.7 F (36.5 C)  SpO2: 99%   Filed Weights   05/28/21 1552  Weight: 158 lb 12.8 oz (72 kg)       LABORATORY DATA:  I have reviewed the data as listed Lab Results  Component Value Date   WBC 6.7 01/18/2021   HGB 12.5 01/18/2021   HCT 39.2 01/18/2021   MCV 92.5 01/18/2021   PLT 243 01/18/2021   Lab Results  Component Value Date   NA 137 01/18/2021   K 3.9 01/18/2021   CL 106 01/18/2021   CO2 23 01/18/2021    RADIOGRAPHIC STUDIES: I have personally reviewed the radiological reports and agreed with the findings in the report.  ASSESSMENT AND PLAN:  Cancer of upper-outer quadrant of female breast ([facility]) 05/04/2021: Palpable mass in the right breast mammogram revealed 0.8 cm mass and the ultrasound revealed a 1.1 cm mass.  Biopsy revealed grade 3 IDC ER/PR negative HER2 positive with a Ki-67 of 30% lymph node biopsy benign  2013: History of right breast cancer ER/PR positive HER2 negative, low risk Oncotype, status post lumpectomy radiation.  Could not tolerate  tamoxifen.  Pathology and radiology counseling: Discussed with the patient, the details of pathology including the type of breast cancer,the clinical staging, the significance of ER, PR and HER-2/neu receptors and the implications for treatment. After reviewing the pathology in detail, we proceeded to discuss the different treatment options between surgery, chemotherapy  Treatment plan: 1.  We discussed with her that the standard of care would be mastectomy.  However she does not want to do a mastectomy and wants to do a lumpectomy alone.  She understands that there will be a high risk of local recurrence. 2. adjuvant chemotherapy with Taxol Herceptin followed by Herceptin maintenance for 1 year  I sent a message to Dr. Brantley Stage regarding her wishes. Return to clinic after surgery to discuss treatment plan.   All questions were answered. The patient knows to call the clinic with any problems, questions or concerns.   Rulon Eisenmenger, MD, MPH 05/28/2021    I, Thana Ates, am acting as scribe for Nicholas Lose, MD.  I have reviewed the above documentation for accuracy and completeness, and I agree with the above.

## 2021-05-28 ENCOUNTER — Other Ambulatory Visit: Payer: Self-pay

## 2021-05-28 ENCOUNTER — Inpatient Hospital Stay (HOSPITAL_BASED_OUTPATIENT_CLINIC_OR_DEPARTMENT_OTHER): Payer: Medicare Other | Admitting: Hematology and Oncology

## 2021-05-28 DIAGNOSIS — Z800 Family history of malignant neoplasm of digestive organs: Secondary | ICD-10-CM | POA: Diagnosis not present

## 2021-05-28 DIAGNOSIS — Z85828 Personal history of other malignant neoplasm of skin: Secondary | ICD-10-CM | POA: Diagnosis not present

## 2021-05-28 DIAGNOSIS — Z803 Family history of malignant neoplasm of breast: Secondary | ICD-10-CM | POA: Diagnosis not present

## 2021-05-28 DIAGNOSIS — Z171 Estrogen receptor negative status [ER-]: Secondary | ICD-10-CM | POA: Diagnosis not present

## 2021-05-28 DIAGNOSIS — Z808 Family history of malignant neoplasm of other organs or systems: Secondary | ICD-10-CM | POA: Diagnosis not present

## 2021-05-28 DIAGNOSIS — Z853 Personal history of malignant neoplasm of breast: Secondary | ICD-10-CM | POA: Diagnosis not present

## 2021-05-28 DIAGNOSIS — C50411 Malignant neoplasm of upper-outer quadrant of right female breast: Secondary | ICD-10-CM

## 2021-05-28 DIAGNOSIS — D420 Neoplasm of uncertain behavior of cerebral meninges: Secondary | ICD-10-CM | POA: Diagnosis not present

## 2021-05-28 DIAGNOSIS — Z79899 Other long term (current) drug therapy: Secondary | ICD-10-CM | POA: Diagnosis not present

## 2021-05-28 DIAGNOSIS — F419 Anxiety disorder, unspecified: Secondary | ICD-10-CM | POA: Diagnosis not present

## 2021-05-28 DIAGNOSIS — Z8041 Family history of malignant neoplasm of ovary: Secondary | ICD-10-CM | POA: Diagnosis not present

## 2021-05-28 DIAGNOSIS — Z87891 Personal history of nicotine dependence: Secondary | ICD-10-CM | POA: Diagnosis not present

## 2021-05-28 DIAGNOSIS — Z923 Personal history of irradiation: Secondary | ICD-10-CM | POA: Diagnosis not present

## 2021-05-28 NOTE — Assessment & Plan Note (Signed)
05/04/2021: Palpable mass in the right breast mammogram revealed 0.8 cm mass and the ultrasound revealed a 1.1 cm mass.  Biopsy revealed grade 3 IDC ER/PR negative HER2 positive with a Ki-67 of 30% lymph node biopsy benign  2013: History of right breast cancer ER/PR positive HER2 negative, low risk Oncotype, status post lumpectomy radiation.  Could not tolerate tamoxifen.  Pathology and radiology counseling: Discussed with the patient, the details of pathology including the type of breast cancer,the clinical staging, the significance of ER, PR and HER-2/neu receptors and the implications for treatment. After reviewing the pathology in detail, we proceeded to discuss the different treatment options between surgery, chemotherapy  Treatment plan: 1.  We discussed with her that the standard of care would be mastectomy.  However she does not want to do a mastectomy and wants to do a lumpectomy alone.  She understands that there will be a high risk of local recurrence. 2. adjuvant chemotherapy with Taxol Herceptin followed by Herceptin maintenance for 1 year  I sent a message to Dr. Brantley Stage regarding her wishes. Return to clinic after surgery to discuss treatment plan.

## 2021-05-29 ENCOUNTER — Encounter: Payer: Self-pay | Admitting: *Deleted

## 2021-06-02 ENCOUNTER — Ambulatory Visit: Payer: Self-pay | Admitting: Surgery

## 2021-06-02 DIAGNOSIS — Z170 Estrogen receptor positive status [ER+]: Secondary | ICD-10-CM

## 2021-06-02 DIAGNOSIS — C50411 Malignant neoplasm of upper-outer quadrant of right female breast: Secondary | ICD-10-CM

## 2021-06-03 ENCOUNTER — Encounter: Payer: Self-pay | Admitting: Family Medicine

## 2021-06-03 ENCOUNTER — Encounter: Payer: Self-pay | Admitting: *Deleted

## 2021-06-03 ENCOUNTER — Other Ambulatory Visit: Payer: Self-pay | Admitting: Family Medicine

## 2021-06-03 ENCOUNTER — Telehealth (INDEPENDENT_AMBULATORY_CARE_PROVIDER_SITE_OTHER): Payer: Medicare Other | Admitting: Family Medicine

## 2021-06-03 ENCOUNTER — Telehealth: Payer: Self-pay

## 2021-06-03 ENCOUNTER — Other Ambulatory Visit: Payer: Self-pay

## 2021-06-03 VITALS — Ht 63.0 in | Wt 158.8 lb

## 2021-06-03 DIAGNOSIS — C50919 Malignant neoplasm of unspecified site of unspecified female breast: Secondary | ICD-10-CM

## 2021-06-03 DIAGNOSIS — R109 Unspecified abdominal pain: Secondary | ICD-10-CM | POA: Diagnosis not present

## 2021-06-03 MED ORDER — VALACYCLOVIR HCL 1 G PO TABS: 1000.0000 mg | ORAL_TABLET | Freq: Three times a day (TID) | ORAL | 0 refills | Status: DC

## 2021-06-03 MED ORDER — GABAPENTIN 300 MG PO CAPS: 600.0000 mg | ORAL_CAPSULE | Freq: Three times a day (TID) | ORAL | 0 refills | Status: DC

## 2021-06-03 NOTE — Telephone Encounter (Signed)
Pt is on your schedule now

## 2021-06-03 NOTE — Telephone Encounter (Signed)
Patient called concerning shingles outbreak.  She heard that an anti-viral could be prescribed within the 72 hours.   Requesting an medication be sent to [facility] or schedule an appt.   Please advise.  (Covering provider unavailable. Sent to Dr. Madilyn Fireman)

## 2021-06-03 NOTE — Progress Notes (Signed)
Virtual Visit via Video Note  I connected with Lori Mcmillan on 06/03/21 at  3:20 PM EDT by a video enabled telemedicine application and verified that I am speaking with the correct person using two identifiers.   I discussed the limitations of evaluation and management by telemedicine and the availability of in person appointments. The patient expressed understanding and agreed to proceed.  Patient location: at home Provider location: in office  Subjective:    CC: thinks has shingles again.   HPI: Previously had shingles in April on her right side.  She had rash in April and she was treated.   She started feeling pain in that area.  No rash yet.  Took $Rem'600mg'tyGa$  of gabapentin.   Dx with recurrent BrCA - Her 2 this past week. No treatments this week.  She has surgery in 2 weeks.     Past medical history, Surgical history, Family history not pertinant except as noted below, Social history, Allergies, and medications have been entered into the medical record, reviewed, and corrections made.    Objective:    General: Speaking clearly in complete sentences without any shortness of breath.  Alert and oriented x3.  Normal judgment. No apparent acute distress.    Impression and Recommendations:    No problem-specific Assessment & Plan notes found for this encounter.  Possible recurrent shingles.  Discussed getting her vaccine later this year.  No rash yet but pain in the same area and feels very similar.  Will start acyclovir and gabapentin for pain.   Call back if any new sx. I did encourage her to send [place] a picture of her MyChart if she starts to notice a rash.  No orders of the defined types were placed in this encounter.  Recurrent breast cancer-we will have surgery in about 2 weeks and then will start chemotherapy about 4 weeks after that.  She would like to try to get her shingles vaccine in between before she starts therapy if at all possible.  Meds ordered this encounter   Medications   gabapentin (NEURONTIN) 300 MG capsule    Sig: Take 2 capsules (600 mg total) by mouth 3 (three) times daily.    Dispense:  90 capsule    Refill:  0   valACYclovir (VALTREX) 1000 MG tablet    Sig: Take 1 tablet (1,000 mg total) by mouth 3 (three) times daily.    Dispense:  21 tablet    Refill:  0     I discussed the assessment and treatment plan with the patient. The patient was provided an opportunity to ask questions and all were answered. The patient agreed with the plan and demonstrated an understanding of the instructions.   The patient was advised to call back or seek an in-person evaluation if the symptoms worsen or if the condition fails to improve as anticipated.   Beatrice Lecher, MD

## 2021-06-04 ENCOUNTER — Telehealth: Payer: Self-pay | Admitting: Hematology and Oncology

## 2021-06-04 NOTE — Telephone Encounter (Signed)
Scheduled appt per 8/17 sch msg. Pt aware.  

## 2021-06-11 ENCOUNTER — Inpatient Hospital Stay (HOSPITAL_BASED_OUTPATIENT_CLINIC_OR_DEPARTMENT_OTHER): Payer: Medicare Other | Admitting: Internal Medicine

## 2021-06-11 DIAGNOSIS — D420 Neoplasm of uncertain behavior of cerebral meninges: Secondary | ICD-10-CM | POA: Diagnosis not present

## 2021-06-11 DIAGNOSIS — R569 Unspecified convulsions: Secondary | ICD-10-CM

## 2021-06-11 MED ORDER — LAMOTRIGINE 25 MG PO TABS: 50.0000 mg | ORAL_TABLET | Freq: Every day | ORAL | 3 refills | Status: DC

## 2021-06-11 NOTE — Progress Notes (Signed)
I connected with Lori Mcmillan on 06/11/21 at 10:30 AM EDT by telephone visit and verified that I am speaking with the correct person using two identifiers.  I discussed the limitations, risks, security and privacy concerns of performing an evaluation and management service by telemedicine and the availability of in-person appointments. I also discussed with the patient that there may be a patient responsible charge related to this service. The patient expressed understanding and agreed to proceed.  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  No rash, but she does describe insomnia, difficulty sleeping on some night since increasing to '100mg'$  dose level.  She has upcoming lumpectomy for newly diagnosed DCIS, seeing Dr. Lindi Adie. Observations: Language and cognition at baseline. 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  If insomnia persists, can try alternate AED such as Vimpat.  Follow Up Instructions:  Will call her again for dose titration in 4 weeks.  I discussed the assessment and treatment plan with the patient.  The patient was provided an opportunity to ask questions and all were answered.  The patient agreed with the plan and demonstrated understanding of the instructions.    The patient was advised to call back or seek an in-person evaluation if the symptoms worsen or if the condition fails to improve as anticipated.  I provided 5-10 minutes of non-face-to-face time during this enocunter.  Ventura Sellers, MD   I provided 15 minutes of non face-to-face telephone visit time during this  encounter, and > 50% was spent counseling as documented under my assessment & plan.

## 2021-06-15 ENCOUNTER — Encounter: Payer: Self-pay | Admitting: *Deleted

## 2021-06-16 ENCOUNTER — Encounter (HOSPITAL_BASED_OUTPATIENT_CLINIC_OR_DEPARTMENT_OTHER): Payer: Self-pay | Admitting: Surgery

## 2021-06-16 ENCOUNTER — Other Ambulatory Visit: Payer: Self-pay

## 2021-06-16 NOTE — Progress Notes (Signed)
Reviewed chart with Dr. Sabra Heck, anesthesiologist at [facility] [place]. Okay to proceed as scheduled

## 2021-06-16 NOTE — Progress Notes (Signed)
Left message with brenda at Dr. Josetta Huddle office that we have not been able to reach patient for pre op phone call.

## 2021-06-17 ENCOUNTER — Encounter (HOSPITAL_BASED_OUTPATIENT_CLINIC_OR_DEPARTMENT_OTHER)
Admission: RE | Admit: 2021-06-17 | Discharge: 2021-06-17 | Disposition: A | Discharge status: Home / Self Care | Payer: Medicare Other | Source: Ambulatory Visit | Attending: Surgery | Admitting: Surgery

## 2021-06-17 DIAGNOSIS — Z853 Personal history of malignant neoplasm of breast: Secondary | ICD-10-CM | POA: Diagnosis not present

## 2021-06-17 DIAGNOSIS — Z452 Encounter for adjustment and management of vascular access device: Secondary | ICD-10-CM | POA: Diagnosis not present

## 2021-06-17 DIAGNOSIS — Z01812 Encounter for preprocedural laboratory examination: Secondary | ICD-10-CM | POA: Insufficient documentation

## 2021-06-17 DIAGNOSIS — Z803 Family history of malignant neoplasm of breast: Secondary | ICD-10-CM | POA: Diagnosis not present

## 2021-06-17 DIAGNOSIS — Z87891 Personal history of nicotine dependence: Secondary | ICD-10-CM | POA: Diagnosis not present

## 2021-06-17 DIAGNOSIS — Z171 Estrogen receptor negative status [ER-]: Secondary | ICD-10-CM | POA: Diagnosis not present

## 2021-06-17 DIAGNOSIS — C50911 Malignant neoplasm of unspecified site of right female breast: Secondary | ICD-10-CM | POA: Diagnosis present

## 2021-06-17 DIAGNOSIS — C50411 Malignant neoplasm of upper-outer quadrant of right female breast: Secondary | ICD-10-CM | POA: Diagnosis not present

## 2021-06-17 LAB — COMPREHENSIVE METABOLIC PANEL
ALT: 15 U/L (ref 0–44)
AST: 21 U/L (ref 15–41)
Albumin: 3.8 g/dL (ref 3.5–5.0)
Alkaline Phosphatase: 78 U/L (ref 38–126)
Anion gap: 8 (ref 5–15)
BUN: 8 mg/dL (ref 6–20)
CO2: 23 mmol/L (ref 22–32)
Calcium: 9.1 mg/dL (ref 8.9–10.3)
Chloride: 105 mmol/L (ref 98–111)
Creatinine, Ser: 0.61 mg/dL (ref 0.44–1.00)
GFR, Estimated: >60 mL/min (ref >60)
Glucose, Bld: 86 mg/dL (ref 70–99)
Potassium: 4.4 mmol/L (ref 3.5–5.1)
Sodium: 136 mmol/L (ref 135–145)
Total Bilirubin: 0.6 mg/dL (ref 0.3–1.2)
Total Protein: 6.9 g/dL (ref 6.5–8.1)

## 2021-06-17 LAB — CBC WITH DIFFERENTIAL/PLATELET
Abs Immature Granulocytes: 0.04 10*3/uL (ref 0.00–0.07)
Basophils Absolute: 0.1 10*3/uL (ref 0.0–0.1)
Basophils Relative: 1 %
Eosinophils Absolute: 0.2 10*3/uL (ref 0.0–0.5)
Eosinophils Relative: 3 %
HCT: 39.3 % (ref 36.0–46.0)
Hemoglobin: 12.8 g/dL (ref 12.0–15.0)
Immature Granulocytes: 1 %
Lymphocytes Relative: 24 %
Lymphs Abs: 2 10*3/uL (ref 0.7–4.0)
MCH: 29.8 pg (ref 26.0–34.0)
MCHC: 32.6 g/dL (ref 30.0–36.0)
MCV: 91.4 fL (ref 80.0–100.0)
Monocytes Absolute: 0.8 10*3/uL (ref 0.1–1.0)
Monocytes Relative: 9 %
Neutro Abs: 5.2 10*3/uL (ref 1.7–7.7)
Neutrophils Relative %: 62 %
Platelets: 327 10*3/uL (ref 150–400)
RBC: 4.3 MIL/uL (ref 3.87–5.11)
RDW: 13.5 % (ref 11.5–15.5)
WBC: 8.3 10*3/uL (ref 4.0–10.5)
nRBC: 0 % (ref 0.0–0.2)

## 2021-06-17 MED ORDER — CHLORHEXIDINE GLUCONATE CLOTH 2 % EX PADS: 6.0000 | MEDICATED_PAD | Freq: Once | CUTANEOUS | Status: DC

## 2021-06-17 NOTE — Anesthesia Preprocedure Evaluation (Addendum)
Anesthesia Evaluation  Patient identified by MRN, date of birth, ID band Patient awake    Reviewed: Allergy & Precautions, NPO status , Patient's Chart, lab work & pertinent test results  History of Anesthesia Complications Negative for: history of anesthetic complications  Airway Mallampati: III  TM Distance: >3 FB Neck ROM: Full    Dental no notable dental hx.    Pulmonary former smoker,    Pulmonary exam normal        Cardiovascular negative cardio ROS Normal cardiovascular exam     Neuro/Psych  Headaches, Seizures - (last in 10/2020, has not taken antiepileptics in last 2-3 weeks per patient choice),  Anxiety Depression Hx of meningioma s/p resection (2018)    GI/Hepatic negative GI ROS, Neg liver ROS,   Endo/Other  negative endocrine ROS  Renal/GU negative Renal ROS  negative genitourinary   Musculoskeletal negative musculoskeletal ROS (+)   Abdominal   Peds  Hematology negative hematology ROS (+)   Anesthesia Other Findings Right breast ca  Reproductive/Obstetrics negative OB ROS                            Anesthesia Physical Anesthesia Plan  ASA: 2  Anesthesia Plan: General   Post-op Pain Management: GA combined w/ Regional for post-op pain   Induction: Intravenous  PONV Risk Score and Plan: 3 and Treatment may vary due to age or medical condition, Ondansetron, Dexamethasone and Midazolam  Airway Management Planned: LMA  Additional Equipment: None  Intra-op Plan:   Post-operative Plan: Extubation in OR  Informed Consent: I have reviewed the patients History and Physical, chart, labs and discussed the procedure including the risks, benefits and alternatives for the proposed anesthesia with the patient or authorized representative who has indicated his/her understanding and acceptance.     Dental advisory given  Plan Discussed with: CRNA  Anesthesia Plan Comments:         Anesthesia Quick Evaluation

## 2021-06-17 NOTE — Progress Notes (Signed)

## 2021-06-18 ENCOUNTER — Encounter (HOSPITAL_BASED_OUTPATIENT_CLINIC_OR_DEPARTMENT_OTHER): Payer: Self-pay | Admitting: Surgery

## 2021-06-18 ENCOUNTER — Other Ambulatory Visit: Payer: Self-pay

## 2021-06-18 ENCOUNTER — Ambulatory Visit (HOSPITAL_BASED_OUTPATIENT_CLINIC_OR_DEPARTMENT_OTHER)
Admission: RE | Admit: 2021-06-18 | Discharge: 2021-06-18 | Disposition: A | Discharge status: Home / Self Care | Payer: Medicare Other | Attending: Surgery | Admitting: Surgery

## 2021-06-18 ENCOUNTER — Ambulatory Visit (HOSPITAL_COMMUNITY): Payer: Medicare Other

## 2021-06-18 ENCOUNTER — Ambulatory Visit (HOSPITAL_BASED_OUTPATIENT_CLINIC_OR_DEPARTMENT_OTHER): Payer: Medicare Other | Admitting: Anesthesiology

## 2021-06-18 ENCOUNTER — Encounter (HOSPITAL_BASED_OUTPATIENT_CLINIC_OR_DEPARTMENT_OTHER)
Admission: RE | Disposition: A | Discharge status: Home / Self Care | Payer: Self-pay | Source: Home / Self Care | Attending: Surgery

## 2021-06-18 ENCOUNTER — Ambulatory Visit (HOSPITAL_COMMUNITY)
Admission: RE | Admit: 2021-06-18 | Discharge: 2021-06-18 | Disposition: A | Discharge status: Home / Self Care | Payer: Medicare Other | Source: Ambulatory Visit | Attending: Surgery | Admitting: Surgery

## 2021-06-18 DIAGNOSIS — Z853 Personal history of malignant neoplasm of breast: Secondary | ICD-10-CM | POA: Insufficient documentation

## 2021-06-18 DIAGNOSIS — Z171 Estrogen receptor negative status [ER-]: Secondary | ICD-10-CM | POA: Diagnosis not present

## 2021-06-18 DIAGNOSIS — Z95828 Presence of other vascular implants and grafts: Secondary | ICD-10-CM

## 2021-06-18 DIAGNOSIS — C50411 Malignant neoplasm of upper-outer quadrant of right female breast: Secondary | ICD-10-CM

## 2021-06-18 DIAGNOSIS — Z419 Encounter for procedure for purposes other than remedying health state, unspecified: Secondary | ICD-10-CM

## 2021-06-18 DIAGNOSIS — Z87891 Personal history of nicotine dependence: Secondary | ICD-10-CM | POA: Insufficient documentation

## 2021-06-18 DIAGNOSIS — Z170 Estrogen receptor positive status [ER+]: Secondary | ICD-10-CM

## 2021-06-18 DIAGNOSIS — Z452 Encounter for adjustment and management of vascular access device: Secondary | ICD-10-CM | POA: Diagnosis not present

## 2021-06-18 DIAGNOSIS — Z803 Family history of malignant neoplasm of breast: Secondary | ICD-10-CM | POA: Insufficient documentation

## 2021-06-18 HISTORY — PX: BREAST LUMPECTOMY WITH RADIOACTIVE SEED AND SENTINEL LYMPH NODE BIOPSY: SHX6550

## 2021-06-18 HISTORY — PX: PORTACATH PLACEMENT: SHX2246

## 2021-06-18 SURGERY — BREAST LUMPECTOMY WITH RADIOACTIVE SEED AND SENTINEL LYMPH NODE BIOPSY
Anesthesia: General | Site: Chest | Laterality: Right

## 2021-06-18 MED ORDER — BUPIVACAINE-EPINEPHRINE (PF) 0.5% -1:200000 IJ SOLN
INTRAMUSCULAR | Status: DC | PRN
  Administered 2021-06-18: 20 mL via PERINEURAL

## 2021-06-18 MED ORDER — HEPARIN SOD (PORK) LOCK FLUSH 100 UNIT/ML IV SOLN
INTRAVENOUS | Status: DC | PRN
  Administered 2021-06-18: 500 [IU] via INTRAVENOUS

## 2021-06-18 MED ORDER — OXYCODONE HCL 5 MG PO TABS: 5.0000 mg | ORAL_TABLET | Freq: Four times a day (QID) | ORAL | 0 refills | Status: DC | PRN

## 2021-06-18 MED ORDER — LIDOCAINE HCL (CARDIAC) PF 100 MG/5ML IV SOSY
PREFILLED_SYRINGE | INTRAVENOUS | Status: DC | PRN
  Administered 2021-06-18: 100 mg via INTRAVENOUS

## 2021-06-18 MED ORDER — BUPIVACAINE LIPOSOME 1.3 % IJ SUSP
INTRAMUSCULAR | Status: DC | PRN
  Administered 2021-06-18: 10 mL via PERINEURAL

## 2021-06-18 MED ORDER — TECHNETIUM TC 99M TILMANOCEPT KIT
1.0000 | PACK | Freq: Once | INTRAVENOUS | Status: AC | PRN
  Administered 2021-06-18: 1 via INTRADERMAL

## 2021-06-18 MED ORDER — PROMETHAZINE HCL 25 MG/ML IJ SOLN: 6.2500 mg | INTRAMUSCULAR | Status: DC | PRN

## 2021-06-18 MED ORDER — IBUPROFEN 800 MG PO TABS: 800.0000 mg | ORAL_TABLET | Freq: Three times a day (TID) | ORAL | 0 refills | Status: DC | PRN

## 2021-06-18 MED ORDER — LIDOCAINE HCL (PF) 2 % IJ SOLN
INTRAMUSCULAR | Status: AC
  Filled 2021-06-18: qty 5

## 2021-06-18 MED ORDER — METHYLENE BLUE 0.5 % INJ SOLN
INTRAVENOUS | Status: AC
  Filled 2021-06-18: qty 10

## 2021-06-18 MED ORDER — PROPOFOL 10 MG/ML IV BOLUS
INTRAVENOUS | Status: AC
  Filled 2021-06-18: qty 20

## 2021-06-18 MED ORDER — LACTATED RINGERS IV SOLN: INTRAVENOUS | Status: DC

## 2021-06-18 MED ORDER — CEFAZOLIN SODIUM-DEXTROSE 2-4 GM/100ML-% IV SOLN
INTRAVENOUS | Status: AC
  Filled 2021-06-18: qty 100

## 2021-06-18 MED ORDER — LACTATED RINGERS IV SOLN: INTRAVENOUS | Status: DC | PRN

## 2021-06-18 MED ORDER — VANCOMYCIN HCL 500 MG IV SOLR
INTRAVENOUS | Status: DC | PRN
  Administered 2021-06-18: 500 mg via TOPICAL

## 2021-06-18 MED ORDER — FENTANYL CITRATE (PF) 100 MCG/2ML IJ SOLN
100.0000 ug | Freq: Once | INTRAMUSCULAR | Status: AC
  Administered 2021-06-18 (×2): 50 ug via INTRAVENOUS

## 2021-06-18 MED ORDER — DEXAMETHASONE SODIUM PHOSPHATE 10 MG/ML IJ SOLN
INTRAMUSCULAR | Status: AC
  Filled 2021-06-18: qty 1

## 2021-06-18 MED ORDER — ONDANSETRON HCL 4 MG/2ML IJ SOLN
INTRAMUSCULAR | Status: DC | PRN
Start: 2021-06-18 — End: 2021-06-18
  Administered 2021-06-18: 4 mg via INTRAVENOUS

## 2021-06-18 MED ORDER — BUPIVACAINE-EPINEPHRINE 0.25% -1:200000 IJ SOLN
INTRAMUSCULAR | Status: DC | PRN
  Administered 2021-06-18: 26 mL

## 2021-06-18 MED ORDER — FENTANYL CITRATE (PF) 100 MCG/2ML IJ SOLN
INTRAMUSCULAR | Status: AC
  Filled 2021-06-18: qty 2

## 2021-06-18 MED ORDER — HEPARIN SOD (PORK) LOCK FLUSH 100 UNIT/ML IV SOLN
INTRAVENOUS | Status: AC
  Filled 2021-06-18: qty 5

## 2021-06-18 MED ORDER — VANCOMYCIN HCL 500 MG IV SOLR
INTRAVENOUS | Status: AC
  Filled 2021-06-18: qty 10

## 2021-06-18 MED ORDER — MIDAZOLAM HCL 2 MG/2ML IJ SOLN
INTRAMUSCULAR | Status: DC | PRN
  Administered 2021-06-18: 2 mg via INTRAVENOUS

## 2021-06-18 MED ORDER — SODIUM CHLORIDE (PF) 0.9 % IJ SOLN
INTRAVENOUS | Status: DC | PRN
  Administered 2021-06-18: 5 mL via INTRAMUSCULAR

## 2021-06-18 MED ORDER — MIDAZOLAM HCL 2 MG/2ML IJ SOLN
INTRAMUSCULAR | Status: AC
  Filled 2021-06-18: qty 2

## 2021-06-18 MED ORDER — DEXAMETHASONE SODIUM PHOSPHATE 10 MG/ML IJ SOLN
INTRAMUSCULAR | Status: DC | PRN
  Administered 2021-06-18: 5 mg via INTRAVENOUS

## 2021-06-18 MED ORDER — PROPOFOL 10 MG/ML IV BOLUS
INTRAVENOUS | Status: DC | PRN
  Administered 2021-06-18: 140 mg via INTRAVENOUS

## 2021-06-18 MED ORDER — ONDANSETRON HCL 4 MG/2ML IJ SOLN
INTRAMUSCULAR | Status: AC
  Filled 2021-06-18: qty 2

## 2021-06-18 MED ORDER — CEFAZOLIN SODIUM-DEXTROSE 2-3 GM-%(50ML) IV SOLR
INTRAVENOUS | Status: DC | PRN
  Administered 2021-06-18: 2 g via INTRAVENOUS

## 2021-06-18 MED ORDER — HEPARIN (PORCINE) IN NACL 2-0.9 UNITS/ML
INTRAMUSCULAR | Status: AC | PRN
  Administered 2021-06-18: 1

## 2021-06-18 MED ORDER — EPHEDRINE SULFATE 50 MG/ML IJ SOLN
INTRAMUSCULAR | Status: DC | PRN
  Administered 2021-06-18 (×2): 15 mg via INTRAVENOUS

## 2021-06-18 MED ORDER — ACETAMINOPHEN 500 MG PO TABS
1000.0000 mg | ORAL_TABLET | Freq: Once | ORAL | Status: AC
  Administered 2021-06-18: 1000 mg via ORAL

## 2021-06-18 MED ORDER — MIDAZOLAM HCL 2 MG/2ML IJ SOLN
2.0000 mg | Freq: Once | INTRAMUSCULAR | Status: AC
  Administered 2021-06-18: 1 mg via INTRAVENOUS

## 2021-06-18 MED ORDER — CEFAZOLIN SODIUM-DEXTROSE 2-4 GM/100ML-% IV SOLN: 2.0000 g | INTRAVENOUS | Status: DC

## 2021-06-18 MED ORDER — FENTANYL CITRATE (PF) 100 MCG/2ML IJ SOLN
INTRAMUSCULAR | Status: DC | PRN
  Administered 2021-06-18: 50 ug via INTRAVENOUS
  Administered 2021-06-18 (×2): 25 ug via INTRAVENOUS
  Administered 2021-06-18 (×2): 50 ug via INTRAVENOUS

## 2021-06-18 MED ORDER — ACETAMINOPHEN 500 MG PO TABS
ORAL_TABLET | ORAL | Status: AC
  Filled 2021-06-18: qty 2

## 2021-06-18 MED ORDER — OXYCODONE HCL 5 MG PO TABS
ORAL_TABLET | ORAL | Status: AC
  Filled 2021-06-18: qty 1

## 2021-06-18 MED ORDER — OXYCODONE HCL 5 MG PO TABS
5.0000 mg | ORAL_TABLET | Freq: Once | ORAL | Status: AC | PRN
  Administered 2021-06-18: 5 mg via ORAL

## 2021-06-18 MED ORDER — SODIUM CHLORIDE (PF) 0.9 % IJ SOLN
INTRAMUSCULAR | Status: AC
  Filled 2021-06-18: qty 10

## 2021-06-18 MED ORDER — FENTANYL CITRATE (PF) 100 MCG/2ML IJ SOLN
25.0000 ug | INTRAMUSCULAR | Status: DC | PRN
  Administered 2021-06-18: 50 ug via INTRAVENOUS

## 2021-06-18 MED ORDER — OXYCODONE HCL 5 MG/5ML PO SOLN: 5.0000 mg | Freq: Once | ORAL | Status: AC | PRN

## 2021-06-18 MED ORDER — NON FORMULARY
Status: DC | PRN
  Administered 2021-06-18: 2 mL via INTRAMUSCULAR

## 2021-06-18 MED ORDER — HEPARIN (PORCINE) IN NACL 1000-0.9 UT/500ML-% IV SOLN
INTRAVENOUS | Status: AC
  Filled 2021-06-18: qty 500

## 2021-06-18 MED ORDER — SODIUM CHLORIDE 0.9 % IV SOLN
INTRAVENOUS | Status: DC | PRN
  Administered 2021-06-18: 500 mL

## 2021-06-18 MED ORDER — SODIUM CHLORIDE 0.9 % IV SOLN
INTRAVENOUS | Status: AC
  Filled 2021-06-18: qty 10

## 2021-06-18 SURGICAL SUPPLY — 59 items
ADH SKN CLS APL DERMABOND .7 (GAUZE/BANDAGES/DRESSINGS) ×4
APL PRP STRL LF DISP 70% ISPRP (MISCELLANEOUS) ×4
APPLIER CLIP 9.375 MED OPEN (MISCELLANEOUS) ×3
APR CLP MED 9.3 20 MLT OPN (MISCELLANEOUS) ×2
BAG DECANTER FOR FLEXI CONT (MISCELLANEOUS) ×5 IMPLANT
BINDER BREAST LRG (GAUZE/BANDAGES/DRESSINGS) ×1 IMPLANT
BLADE HEX COATED 2.75 (ELECTRODE) ×3 IMPLANT
BLADE SURG 11 STRL SS (BLADE) ×3 IMPLANT
BLADE SURG 15 STRL LF DISP TIS (BLADE) ×2 IMPLANT
BLADE SURG 15 STRL SS (BLADE) ×3
CANISTER SUCT 1200ML W/VALVE (MISCELLANEOUS) ×3 IMPLANT
CHLORAPREP W/TINT 26 (MISCELLANEOUS) ×4 IMPLANT
CLIP APPLIE 9.375 MED OPEN (MISCELLANEOUS) ×2 IMPLANT
COVER BACK TABLE 60X90IN (DRAPES) ×3 IMPLANT
COVER MAYO STAND STRL (DRAPES) ×3 IMPLANT
COVER PROBE 5X48 (MISCELLANEOUS) ×3
COVER PROBE W GEL 5X96 (DRAPES) ×5 IMPLANT
DERMABOND ADVANCED (GAUZE/BANDAGES/DRESSINGS) ×2
DERMABOND ADVANCED .7 DNX12 (GAUZE/BANDAGES/DRESSINGS) ×3 IMPLANT
DRAPE C-ARM 42X72 X-RAY (DRAPES) ×3 IMPLANT
DRAPE LAPAROSCOPIC ABDOMINAL (DRAPES) ×5 IMPLANT
DRAPE UTILITY XL STRL (DRAPES) ×5 IMPLANT
ELECT COATED BLADE 2.86 ST (ELECTRODE) ×3 IMPLANT
ELECT REM PT RETURN 9FT ADLT (ELECTROSURGICAL) ×3
ELECTRODE REM PT RTRN 9FT ADLT (ELECTROSURGICAL) ×2 IMPLANT
GLOVE SRG 8 PF TXTR STRL LF DI (GLOVE) ×2 IMPLANT
GLOVE SURG LTX SZ8 (GLOVE) ×5 IMPLANT
GLOVE SURG POLYISO LF SZ7 (GLOVE) ×1 IMPLANT
GLOVE SURG UNDER POLY LF SZ7 (GLOVE) ×4 IMPLANT
GLOVE SURG UNDER POLY LF SZ8 (GLOVE) ×6
GOWN STRL REUS W/ TWL LRG LVL3 (GOWN DISPOSABLE) ×4 IMPLANT
GOWN STRL REUS W/ TWL XL LVL3 (GOWN DISPOSABLE) ×2 IMPLANT
GOWN STRL REUS W/TWL LRG LVL3 (GOWN DISPOSABLE) ×6
GOWN STRL REUS W/TWL XL LVL3 (GOWN DISPOSABLE) ×6
HEMOSTAT ARISTA ABSORB 3G PWDR (HEMOSTASIS) ×2 IMPLANT
HEMOSTAT SNOW SURGICEL 2X4 (HEMOSTASIS) IMPLANT
KIT CVR 48X5XPRB PLUP LF (MISCELLANEOUS) ×2 IMPLANT
KIT MARKER MARGIN INK (KITS) ×3 IMPLANT
KIT PORT POWER 8FR ISP CVUE (Port) ×1 IMPLANT
NDL HYPO 25X1 1.5 SAFETY (NEEDLE) ×1 IMPLANT
NDL SAFETY ECLIPSE 18X1.5 (NEEDLE) IMPLANT
NEEDLE HYPO 18GX1.5 SHARP (NEEDLE) ×6
NEEDLE HYPO 25X1 1.5 SAFETY (NEEDLE) ×9 IMPLANT
NS IRRIG 1000ML POUR BTL (IV SOLUTION) ×3 IMPLANT
PACK BASIN DAY SURGERY FS (CUSTOM PROCEDURE TRAY) ×3 IMPLANT
PENCIL SMOKE EVACUATOR (MISCELLANEOUS) ×3 IMPLANT
SLEEVE SCD COMPRESS KNEE MED (STOCKING) ×3 IMPLANT
SPONGE T-LAP 4X18 ~~LOC~~+RFID (SPONGE) ×4 IMPLANT
SUT MNCRL AB 4-0 PS2 18 (SUTURE) ×3 IMPLANT
SUT MON AB 4-0 PC3 18 (SUTURE) ×3 IMPLANT
SUT PROLENE 2 0 SH DA (SUTURE) ×3 IMPLANT
SUT VICRYL 3-0 CR8 SH (SUTURE) ×5 IMPLANT
SYR 5ML LUER SLIP (SYRINGE) ×3 IMPLANT
SYR CONTROL 10ML LL (SYRINGE) ×4 IMPLANT
TOWEL GREEN STERILE FF (TOWEL DISPOSABLE) ×6 IMPLANT
TRACER MAGTRACE VIAL (MISCELLANEOUS) ×2 IMPLANT
TRAY FAXITRON CT DISP (TRAY / TRAY PROCEDURE) ×3 IMPLANT
TUBE CONNECTING 20X1/4 (TUBING) ×3 IMPLANT
YANKAUER SUCT BULB TIP NO VENT (SUCTIONS) ×3 IMPLANT

## 2021-06-18 NOTE — Interval H&P Note (Signed)
History and Physical Interval Note:  06/18/2021 8:24 AM  Lori Mcmillan  has presented today for surgery, with the diagnosis of BREAST CANCER.  The various methods of treatment have been discussed with the patient and family. After consideration of risks, benefits and other options for treatment, the patient has consented to  Procedure(s): RIGHT BREAST LUMPECTOMY WITH RADIOACTIVE SEED AND SENTINEL LYMPH NODE BIOPSY (Right) INSERTION PORT-A-CATH (N/A) as a surgical intervention.  The patient's history has been reviewed, patient examined, no change in status, stable for surgery.  I have reviewed the patient's chart and labs.  Questions were answered to the patient's satisfaction.     [place]

## 2021-06-18 NOTE — Discharge Instructions (Addendum)
PORT-A-CATH: POST OP INSTRUCTIONS  Always review your discharge instruction sheet given to you by the facility where your surgery was performed.   A prescription for pain medication may be given to you upon discharge. Take your pain medication as prescribed, if needed. If narcotic pain medicine is not needed, then you make take acetaminophen (Tylenol) or ibuprofen (Advil) as needed.  Take your usually prescribed medications unless otherwise directed. If you need a refill on your pain medication, please contact our office. All narcotic pain medicine now requires a paper prescription.  Phoned in and fax refills are no longer allowed by law.  Prescriptions will not be filled after 5 pm or on weekends.  You should follow a light diet for the remainder of the day after your procedure. Most patients will experience some mild swelling and/or bruising in the area of the incision. It may take several days to resolve. It is common to experience some constipation if taking pain medication after surgery. Increasing fluid intake and taking a stool softener (such as Colace) will usually help or prevent this problem from occurring. A mild laxative (Milk of Magnesia or Miralax) should be taken according to package directions if there are no bowel movements after 48 hours.  Unless discharge instructions indicate otherwise, you may remove your bandages 48 hours after surgery, and you may shower at that time. You may have steri-strips (small white skin tapes) in place directly over the incision.  These strips should be left on the skin for 7-10 days.  If your surgeon used Dermabond (skin glue) on the incision, you may shower in 24 hours.  The glue will flake off over the next 2-3 weeks.  If your port is left accessed at the end of surgery (needle left in port), the dressing cannot get wet and should only by changed by a healthcare professional. When the port is no longer accessed (when the needle has been removed),  follow step 7.   ACTIVITIES:  Limit activity involving your arms for the next 72 hours. Do no strenuous exercise or activity for 1 week. You may drive when you are no longer taking prescription pain medication, you can comfortably wear a seatbelt, and you can maneuver your car. 10.You may need to see your doctor in the office for a follow-up appointment.  Please       check with your doctor.  11.When you receive a new Port-a-Cath, you will get a product guide and        ID card.  Please keep them in case you need them.  WHEN TO CALL YOUR DOCTOR [phone]): Fever over 101.0 Chills Continued bleeding from incision Increased redness and tenderness at the site Shortness of breath, difficulty breathing   The clinic staff is available to answer your questions during regular business hours. Please don't hesitate to call and ask to speak to one of the nurses or medical assistants for clinical concerns. If you have a medical emergency, go to the nearest emergency room or call 911.  A surgeon from [facility] is always on call at the hospital.     For further information, please visit [email or web address]    [employer] Phone Number [phone]  BREAST BIOPSY/ PARTIAL MASTECTOMY: POST OP INSTRUCTIONS  Always review your discharge instruction sheet given to you by the facility where your surgery was performed.  IF YOU HAVE DISABILITY OR FAMILY LEAVE FORMS, YOU MUST BRING THEM TO THE OFFICE FOR PROCESSING.  DO NOT  GIVE THEM TO YOUR DOCTOR.  A prescription for pain medication may be given to you upon discharge.  Take your pain medication as prescribed, if needed.  If narcotic pain medicine is not needed, then you may take acetaminophen (Tylenol) or ibuprofen (Advil) as needed. Take your usually prescribed medications unless otherwise directed If you need a refill on your pain medication, please contact your pharmacy.  They will contact our office  to request authorization.  Prescriptions will not be filled after 5pm or on week-ends. You should eat very light the first 24 hours after surgery, such as soup, crackers, pudding, etc.  Resume your normal diet the day after surgery. Most patients will experience some swelling and bruising in the breast.  Ice packs and a good support bra will help.  Swelling and bruising can take several days to resolve.  It is common to experience some constipation if taking pain medication after surgery.  Increasing fluid intake and taking a stool softener will usually help or prevent this problem from occurring.  A mild laxative (Milk of Magnesia or Miralax) should be taken according to package directions if there are no bowel movements after 48 hours. Unless discharge instructions indicate otherwise, you may remove your bandages 24-48 hours after surgery, and you may shower at that time.  You may have steri-strips (small skin tapes) in place directly over the incision.  These strips should be left on the skin for 7-10 days.  If your surgeon used skin glue on the incision, you may shower in 24 hours.  The glue will flake off over the next 2-3 weeks.  Any sutures or staples will be removed at the office during your follow-up visit. ACTIVITIES:  You may resume regular daily activities (gradually increasing) beginning the next day.  Wearing a good support bra or sports bra minimizes pain and swelling.  You may have sexual intercourse when it is comfortable. You may drive when you no longer are taking prescription pain medication, you can comfortably wear a seatbelt, and you can safely maneuver your car and apply brakes. RETURN TO WORK:  ______________________________________________________________________________________ Dennis Bast should see your doctor in the office for a follow-up appointment approximately two weeks after your surgery.  Your doctor's nurse will typically make your follow-up appointment when she calls you with  your pathology report.  Expect your pathology report 2-3 business days after your surgery.  You may call to check if you do not hear from [place] after three days. OTHER INSTRUCTIONS: _______________________________________________________________________________________________ _____________________________________________________________________________________________________________________________________ _____________________________________________________________________________________________________________________________________ _____________________________________________________________________________________________________________________________________  WHEN TO CALL YOUR DOCTOR: Fever over 101.0 Nausea and/or vomiting. Extreme swelling or bruising. Continued bleeding from incision. Increased pain, redness, or drainage from the incision.  The clinic staff is available to answer your questions during regular business hours.  Please don't hesitate to call and ask to speak to one of the nurses for clinical concerns.  If you have a medical emergency, go to the nearest emergency room or call 911.  A surgeon from [facility] is always on call at the hospital.  For further questions, please visit [email or web address]    No Tylenol until after 2pm today if needed   Post Anesthesia Home Care Instructions  Activity: Get plenty of rest for the remainder of the day. A responsible individual must stay with you for 24 hours following the procedure.  For the next 24 hours, DO NOT: -Drive a car -Paediatric nurse -Drink alcoholic beverages -Take any medication unless instructed by your physician -Make any legal decisions or  sign important papers.  Meals: Start with liquid foods such as gelatin or soup. Progress to regular foods as tolerated. Avoid greasy, spicy, heavy foods. If nausea and/or vomiting occur, drink only clear liquids until the nausea and/or vomiting  subsides. Call your physician if vomiting continues.  Special Instructions/Symptoms: Your throat may feel dry or sore from the anesthesia or the breathing tube placed in your throat during surgery. If this causes discomfort, gargle with warm salt water. The discomfort should disappear within 24 hours.  If you had a scopolamine patch placed behind your ear for the management of post- operative nausea and/or vomiting:  1. The medication in the patch is effective for 72 hours, after which it should be removed.  Wrap patch in a tissue and discard in the trash. Wash hands thoroughly with soap and water. 2. You may remove the patch earlier than 72 hours if you experience unpleasant side effects which may include dry mouth, dizziness or visual disturbances. 3. Avoid touching the patch. Wash your hands with soap and water after contact with the patch.        Information for Discharge Teaching: EXPAREL (bupivacaine liposome injectable suspension)   Your surgeon or anesthesiologist gave you EXPAREL(bupivacaine) to help control your pain after surgery.  EXPAREL is a local anesthetic that provides pain relief by numbing the tissue around the surgical site. EXPAREL is designed to release pain medication over time and can control pain for up to 72 hours. Depending on how you respond to EXPAREL, you may require less pain medication during your recovery.  Possible side effects: Temporary loss of sensation or ability to move in the area where bupivacaine was injected. Nausea, vomiting, constipation Rarely, numbness and tingling in your mouth or lips, lightheadedness, or anxiety may occur. Call your doctor right away if you think you may be experiencing any of these sensations, or if you have other questions regarding possible side effects.  Follow all other discharge instructions given to you by your surgeon or nurse. Eat a healthy diet and drink plenty of water or other fluids.  If you return to the  hospital for any reason within 96 hours following the administration of EXPAREL, it is important for health care providers to know that you have received this anesthetic. A teal colored band has been placed on your arm with the date, time and amount of EXPAREL you have received in order to alert and inform your health care providers. Please leave this armband in place for the full 96 hours following administration, and then you may remove the band.

## 2021-06-18 NOTE — Anesthesia Postprocedure Evaluation (Signed)
Anesthesia Post Note  Patient: Lori Mcmillan  Procedure(s) Performed: RIGHT BREAST LUMPECTOMY WITH RADIOACTIVE SEED AND SENTINEL LYMPH NODE BIOPSY (Right: Breast) INSERTION PORT-A-CATH (Right: Chest)     Patient location during evaluation: PACU Anesthesia Type: General Level of consciousness: awake and alert and oriented Pain management: pain level controlled Vital Signs Assessment: post-procedure vital signs reviewed and stable Respiratory status: spontaneous breathing, nonlabored ventilation and respiratory function stable Cardiovascular status: blood pressure returned to baseline Postop Assessment: no apparent nausea or vomiting Anesthetic complications: no   No notable events documented.  Last Vitals:  Vitals:   06/18/21 1200 06/18/21 1215  BP: 137/77 121/76  Pulse: 82 75  Resp: 13 14  Temp:    SpO2: 99% 94%    Last Pain:  Vitals:   06/18/21 1237  TempSrc:   PainSc: [place]

## 2021-06-18 NOTE — Progress Notes (Signed)
Nuc med inj performed by nuc med staff. Pt tol well after additional fentanyl given for discomfort. VSS. Emotional support provided

## 2021-06-18 NOTE — Op Note (Addendum)
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    Anesthesia: LMA with pectoral block anesthesia and marcaine local   EBL: 20 cc   Specimen: right  breast mass with clip and seed to pathology and  3  axillary sentinel  node hot  mostly with mag trace no blue dye    Drains: None    Indications for procedure: Patient presents for treatment of her right breast cancer. She has opted for breast conservation after lengthy discussion of treatment options to include breast conservation surgery and mastectomy and reconstruction.  She had a previous right breast lumpectomy  and SLN mapping in 2013 followed by radiation.  She refused mastectomy and desired repeat lumpectomy. Discussed this was not the typical standard of care and increased locoregional recurrence rates. She saw medical and radiation oncology as well.  She will need chemotherapy. Risks, benefits and alternatives discussed with the patient.The procedure has been discussed with the patient. Alternatives to surgery have been discussed with the patient. Risks of surgery include bleeding, Infection, Seroma formation, death, and the need for further surgery. The patient understands and wishes to proceed.Sentinel lymph node mapping and dissection has been discussed with the patient. Risk of bleeding, Infection, Seroma formation,  arm swelling Additional procedures,, Shoulder weakness , Shoulder stiffness, Nerve and blood vessel injury and reaction to the mapping dyes have been discussed. Alternatives to surgery have been discussed with the patient. The patient agrees to proceed.         Description of procedure: Patient underwent placement of right  breast seed by radiology  earlier in radiology. Pt met in the holding area and questions answered.Risks, benefits and alternative treatment options discussed.  Risks include bleeding,  Infection,  Pneumothorax,  Hemothorax,  Organ injury,  Mediastinal structure injury,  Death,  Need for additional procedures,  Catheter migration,  Malfunction and infection.  Pt agrees to proceed.  Right side marked as correct after verifying seed in the right breast.    The patient was placed supine on the OR table.  Both arms were tucked and a roll placed under the shoulders.  LMA anesthesia was began.  Upper chest and neck regions were prepped and draped in a sterile fashion.  Time out was done and antibiotics were given.   2 cc of magtrace and 4 cc of methylene blue were injected in the subareolar plexus  right breast under sterile conditions.  The patient was placed in Trendelenberg position and the  right internal jugular vein was  visualized with ultrasound  and cannulated with a needle and the return of dark non pulsatile blood noted.  The wire was fed easily through the needle and the needle removed.  Fluoroscopy  Showed the wire in the SVC.  Local anesthesia infiltrated and small incision made.  Port flushed and brought onto the field.  The port was attached.  8 french power used.  Small stab incision made at wire site.  The catheter was tunneled from the lower incision to the upper incision.  With  the patient in [place],  The dilator and introducer were passed without resistance.  The wire and dilator were removed leaving the introducer in place.  The catheter was fed through the  introducer and the sheath peeled away.Fluoroscopy showed the tip in the SVC without pneumothorax.  The catheter flushed and drew back easily.  Heparin saline was used.  5 cc of 100 u per cc heparin saline used in the port.  Incisions closed with 3 o vicryl and 4 0 monocryl..  Dermabond as dressing.  She was then repositioned, prepped and draped  sterile.                                Of note, patient had pectoral block by anesthesia prior to this on the right . Neoprobe was used to identify the radioactive seen in the  right  upper inner upper-inner  quadrant. Transverse  incision made and dissection was carried around to excise all tissue around both the clip and seed. Radiograph showed the mass with gross negative margins. Both seed and clip were in the specimen. Specimen sent to pathology.after orientation with ink.   Neoprobe was switched to the technetium sulfur colloid setting.  Mag trace probe used as well. The node was identified with neoprobe and mag trace probes.  Incision made in the inferior axillary hairline and dissection carried into the axilla. Hot  lymph nodes (3 )  were  identified and excised. Long thoracic nerve, thoracodorsal trunk and axillary vein all preserved.  Background counts approached baseline. The wound was was irrigated , [place] and vancomycin powder place and then  closed with 3-0 Vicryl and 4-0 Monocryl. Lumpectomy site closed in a similar fashion. Dermabond applied. All final counts sponge, needle instruments found to be correct at this point. Patient awoke, taken to recovery in satisfactory condition.

## 2021-06-18 NOTE — Anesthesia Procedure Notes (Signed)
Procedure Name: LMA Insertion Date/Time: 06/18/2021 9:27 AM Performed by: Verita Lamb, CRNA Pre-anesthesia Checklist: Patient identified, Emergency Drugs available, Suction available and Patient being monitored Patient Re-evaluated:Patient Re-evaluated prior to induction Oxygen Delivery Method: Circle system utilized Preoxygenation: Pre-oxygenation with 100% oxygen Induction Type: IV induction Ventilation: Mask ventilation without difficulty LMA: LMA inserted LMA Size: 4.0 Number of attempts: 1 Airway Equipment and Method: Bite block Placement Confirmation: positive ETCO2, CO2 detector and breath sounds checked- equal and bilateral Tube secured with: Tape Dental Injury: Teeth and Oropharynx as per pre-operative assessment

## 2021-06-18 NOTE — Progress Notes (Signed)
Assisted Dr. Daiva Huge with right, ultrasound guided, pectoralis block. Side rails up, monitors on throughout procedure. See vital signs in flow sheet. Tolerated Procedure well.

## 2021-06-18 NOTE — Anesthesia Procedure Notes (Signed)
Anesthesia Regional Block: Pectoralis block   Pre-Anesthetic Checklist: , timeout performed,  Correct Patient, Correct Site, Correct Laterality,  Correct Procedure, Correct Position, site marked,  Risks and benefits discussed,  Pre-op evaluation,  At surgeon's request and post-op pain management  Laterality: Right  Prep: Maximum Sterile Barrier Precautions used, chloraprep       Needles:  Injection technique: Single-shot  Needle Type: Echogenic Stimulator Needle     Needle Length: 9cm  Needle Gauge: 22     Additional Needles:   Procedures:,,,, ultrasound used (permanent image in chart),,    Narrative:  Start time: 06/18/2021 8:08 AM End time: 06/18/2021 8:11 AM Injection made incrementally with aspirations every 5 mL.  Performed by: Personally  Anesthesiologist: Brennan Bailey, MD  Additional Notes: Risks, benefits, and alternative discussed. Patient gave consent for procedure. Patient prepped and draped in sterile fashion. Sedation administered, patient remains easily responsive to voice. Relevant anatomy identified with ultrasound guidance. Local anesthetic given in 5cc increments with no signs or symptoms of intravascular injection. No pain or paraesthesias with injection. Patient monitored throughout procedure with signs of LAST or immediate complications. Tolerated well. Ultrasound image placed in chart.  Tawny Asal, MD

## 2021-06-18 NOTE — Transfer of Care (Signed)
Immediate Anesthesia Transfer of Care Note  Patient: Lori Mcmillan  Procedure(s) Performed: RIGHT BREAST LUMPECTOMY WITH RADIOACTIVE SEED AND SENTINEL LYMPH NODE BIOPSY (Right: Breast) INSERTION PORT-A-CATH (Right: Chest)  Patient Location: PACU  Anesthesia Type:General  Level of Consciousness: awake, alert  and oriented  Airway & Oxygen Therapy: Patient Spontanous Breathing and Patient connected to face mask oxygen  Post-op Assessment: Report given to RN and Post -op Vital signs reviewed and stable  Post vital signs: Reviewed and stable  Last Vitals:  Vitals Value Taken Time  BP 129/81 06/18/21 1141  Temp    Pulse 91 06/18/21 1141  Resp 5 06/18/21 1141  SpO2 96 % 06/18/21 1141  Vitals shown include unvalidated device data.  Last Pain:  Vitals:   06/18/21 0720  TempSrc: Oral  PainSc: 3       Patients Stated Pain Goal: 8 ([id number])  Complications: No notable events documented.

## 2021-06-18 NOTE — H&P (Signed)
Subjective   Chief Complaint: Breast Cancer   History of Present Illness: Lori Mcmillan is a 60 y.o. female who is seen today as an office consultation at the request of Dr. Luan Pulling for evaluation of Breast Cancer .   Patient has a history of right breast cancer stage I diagnosed in 2013 treated with lumpectomy, sentinel lymph node mapping and radiation therapy. She received tamoxifen but did not stay on it. 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 She has had a history of a brain tumor and has had radiation therapy to that. She has a strong family history of breast cancer as well. Also strong family history of melanoma and pancreatic cancer she states. She has not undergone any recent genetic testing she states. She has no complaints.  Review of Systems: A complete review of systems was obtained from the patient. I have reviewed this information and discussed as appropriate with the patient. See HPI as well for other ROS.  Review of Systems  Psychiatric/Behavioral: Positive for memory loss.  All other systems reviewed and are negative.   Medical History: Past Medical History:  Diagnosis Date   Anxiety   History of cancer   There is no problem list on file for this patient.  Past Surgical History:  Procedure Laterality Date   APPENDECTOMY   right lumpectomy Right    Allergies  Allergen Reactions   Lidocaine Other (See Comments)   No current outpatient medications on file prior to visit.   No current facility-administered medications on file prior to visit.   Family History  Problem Relation Age of Onset   Breast cancer Mother   Skin cancer Other    Social History   Tobacco Use  Smoking Status Former Smoker   Quit date: 2018   Years since quitting: 4.5  Smokeless Tobacco Never Used    Social History    Socioeconomic History   Marital status: Single  Tobacco Use   Smoking status: Former Smoker  Quit date: 2018  Years since quitting: 4.5   Smokeless tobacco: Never Used  Substance and Sexual Activity   Alcohol use: Yes   Drug use: Never   Objective:   Vitals:  05/19/21 1439  BP: 130/82  Pulse: 88  Weight: 72.1 kg (159 lb)  Height: 160 cm ($Remov'5\' 3"'XigMDe$ )   Body mass index is 28.17 kg/m.  Physical Exam Constitutional:  Appearance: Normal appearance.  HENT:  Head: Normocephalic.  Eyes:  General: No scleral icterus. Pupils: Pupils are equal, round, and reactive to light.  Cardiovascular:  Rate and Rhythm: Normal rate and regular rhythm.  Pulmonary:  Effort: Pulmonary effort is normal.  Breath sounds: Normal breath sounds.  Chest:   Abdominal:  General: Abdomen is flat.  Musculoskeletal:  General: Normal range of motion.  Cervical back: Normal range of motion and neck supple.  Lymphadenopathy:  Upper Body:  Right upper body: No supraclavicular or axillary adenopathy.  Left upper body: No supraclavicular or axillary adenopathy.  Skin: General: Skin is warm and dry.  Neurological:  General: No focal deficit present.  Mental Status: She is alert and oriented to person, place, and time.  Psychiatric:  Mood and Affect: Mood normal.  Behavior: Behavior normal.     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  Assessment and Plan:  Diagnoses and all orders for this visit:  Malignant neoplasm of upper-outer quadrant of right breast in female, estrogen receptor negative (CMS-HCC)    Discussed mastectomy with reconstruction, breast conserving surgery and the pros and cons of each. Standard of care would be completion mastectomy on the right which I discussed with her but also reviewed data  on repeat lumpectomy after radiation therapy for recurrent and or new breast cancer. ER and PR negative but HER2/neu positive. Refer to medical oncology for opinion about chemotherapy.   Patient is seeing medical and radiation oncology.  She still desires breast conserving surgery understanding the limitations of potential recurrence rates given that.  She will require chemotherapy therefore Port-A-Cath will be placed.  Risk of Port-A-Cath insertion include but not exclusive of bleeding, infection, pneumothorax, hemothorax, cardiac injury, mediastinal injury, death, DVT, catheter migration, catheter malfunction, catheter thrombosis the need further treatments and/or procedures.  Proceed with right breast seed localized lumpectomy with attempted sentinel lymph node mapping.  Success rates for repeat injections and mapping are about 80 to 85%.  We will use additional tracer to assist in this circumstance.  Risk of bleeding, infection, lymphedema, pain, recurrence, cosmetic deformity, the need further treatments and/or procedures discussed.

## 2021-06-19 ENCOUNTER — Encounter (HOSPITAL_BASED_OUTPATIENT_CLINIC_OR_DEPARTMENT_OTHER): Payer: Self-pay | Admitting: Surgery

## 2021-06-19 NOTE — Addendum Note (Signed)
Addendum  created 06/19/21 1000 by Pranav Lince, Ernesta Amble, CRNA   Charge Capture section accepted

## 2021-06-23 LAB — SURGICAL PATHOLOGY

## 2021-06-24 ENCOUNTER — Encounter: Payer: Self-pay | Admitting: Surgery

## 2021-06-25 ENCOUNTER — Encounter: Payer: Self-pay | Admitting: *Deleted

## 2021-06-30 NOTE — Progress Notes (Signed)
Patient Care Team: Everrett Coombe, DO as PCP - General (Family Medicine) [name], MD as Consulting Physician (Radiation Oncology) Barbaraann Cao Georgeanna Lea, MD as Consulting Physician (Oncology) [facility] ([facility]) Domenic Schwab, Lucianne Lei, MD as Referring Physician (Dermatology) Graylin Shiver, MD as Referring Physician (Otolaryngology) Pershing Proud, RN as Oncology Nurse Navigator Donnelly Angelica, RN as Oncology Nurse Navigator  DIAGNOSIS:    ICD-10-CM   1. Malignant neoplasm of upper-outer quadrant of right female breast, unspecified estrogen receptor status (HCC)  C50.411       SUMMARY OF ONCOLOGIC HISTORY: Oncology History  Cancer of upper-outer quadrant of female breast (HCC)  2013 Initial Diagnosis   Right breast cancer: Stage Ia ER/PR positive HER2 negative status postlumpectomy, radiation (low risk Oncotype) could not tolerate tamoxifen for more than 30 days.   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  Biopsy revealed grade 3 IDC ER/PR negative HER2 positive with a Ki-67 of 30%   05/26/2021 Cancer Staging   Staging form: Breast, AJCC 7th Edition - Clinical stage from 05/26/2021: Stage Unknown (T1c, NX, cM0) - Signed by [name], MD on 05/28/2021 Specimen type: Core Needle Biopsy Stage prefix: Initial diagnosis Laterality: Right Tumor grade (Scarff-Bloom-Richardson system): G3 Estrogen receptor status: Negative Progesterone receptor status: Negative HER2 status: Positive Staging comments: Staged at Breast Cancer Conference 12/08/11      06/18/2021 Surgery   Right lumpectomy: Grade 3 IDC 1.5 cm, high-grade DCIS, margins negative, 0/2 lymph nodes negative, ER 0%, PR 0%, HER2 3+ positive, Ki-67 30%     CHIEF COMPLIANT:  Follow-up of right breast cancer  INTERVAL HISTORY: Lori Mcmillan is a 60 y.o. with above-mentioned history of right breast cancer. Right breast lumpectomy on 06/18/2021 with Dr. Harriette Bouillon showed grade 3 invasive ductal carcinoma and high grade DCIS with resection margins negative for carcinoma and right axillary lymph nodes negative for carcinoma. She presents to the clinic today for follow-up.  She developed a slight infection of the surgical incision and she is currently on doxycycline.  She thinks she is recovering very well from that infection.  Denies any fevers or chills.  Denies any tenderness in the breast.  ALLERGIES:  is allergic to lidocaine.  MEDICATIONS:  Current Outpatient Medications  Medication Sig Dispense Refill   diazepam (VALIUM) 2 MG tablet Take by mouth.     gabapentin (NEURONTIN) 300 MG capsule Take 2 capsules (600 mg total) by mouth 3 (three) times daily. 90 capsule 0   ibuprofen (ADVIL) 800 MG tablet Take 1 tablet (800 mg total) by mouth every 8 (eight) hours as needed. 30 tablet 0   lamoTRIgine (LAMICTAL) 25 MG tablet Take 2 tablets (50 mg total) by mouth daily. 60 tablet 3   oxyCODONE (OXY IR/ROXICODONE) 5 MG immediate release tablet Take 1 tablet (5 mg total) by mouth every 6 (six) hours as needed for severe pain. 15 tablet 0   valACYclovir (VALTREX) 1000 MG tablet Take 1 tablet (1,000 mg total) by mouth 3 (three) times daily. 21 tablet 0   No current facility-administered medications for this visit.    PHYSICAL EXAMINATION: ECOG PERFORMANCE STATUS: 1 - Symptomatic but completely ambulatory  Vitals:   07/01/21 1056  BP: 137/70  Pulse: 77  Resp: 18  Temp: (!) 97.2 F (36.2 C)  SpO2: 100%   Filed Weights   07/01/21 1056  Weight: 158 lb 12.8 oz (72 kg)      LABORATORY DATA:  I have reviewed the data as listed CMP Latest Ref Rng & Units 06/17/2021 01/18/2021 11/23/2019  Glucose 70 - 99 mg/dL 86 99 -  BUN 6 - 20 mg/dL $Remove'8 15 9  'cFqfqoR$ Creatinine 0.44 -  1.00 mg/dL 0.61 0.57 0.7  Sodium 135 - 145 mmol/L 136 137 140  Potassium 3.5 - 5.1 mmol/L 4.4 3.9 4.3  Chloride 98 - 111 mmol/L 105 106 104  CO2 22 - 32 mmol/L $RemoveB'23 23 19  'vtHPpOqZ$ Calcium 8.9 - 10.3 mg/dL 9.1 8.9 9.3  Total Protein 6.5 - 8.1 g/dL 6.9 7.1 -  Total Bilirubin 0.3 - 1.2 mg/dL 0.6 0.7 -  Alkaline Phos 38 - 126 U/L 78 76 122  AST 15 - 41 U/L $Remo'21 18 17  'pxUir$ ALT 0 - 44 U/L $Remo'15 18 14    'Pwpyp$ Lab Results  Component Value Date   WBC 8.3 06/17/2021   HGB 12.8 06/17/2021   HCT 39.3 06/17/2021   MCV 91.4 06/17/2021   PLT 327 06/17/2021   NEUTROABS 5.2 06/17/2021    ASSESSMENT & PLAN:  Cancer of upper-outer quadrant of female breast ([place]) 06/18/2021:Right lumpectomy: Grade 3 IDC 1.5 cm, high-grade DCIS, margins negative, 0/2 lymph nodes negative, ER 0%, PR 0%, HER2 3+ positive, Ki-67 30%   (2013 Right breast cancer: Stage Ia ER/PR positive HER2 negative status postlumpectomy, radiation (low risk Oncotype) could not tolerate tamoxifen for more than 30 days.)  Pathology counseling: I discussed the final pathology report of the patient provided  a copy of this report. I discussed the margins.  We also discussed the final staging along with previously performed ER/PR testing.  Treatment plan: 1.  Adjuvant chemotherapy with Taxol and Herceptin followed by Herceptin maintenance 2. discussion regarding pros and cons of reirradiation (Dr. Isidore Moos will need to inform as if she is eligible for radiation given her prior history of radiation therapy to the breast)  Postoperative infection: Currently on doxycycline. Echocardiogram and chemo class will be scheduled.  Return to clinic in 3 weeks to start chemotherapy    No orders of the defined types were placed in this encounter.  The patient has a good understanding of the overall plan. she agrees with it. she will call with any problems that may develop before the next visit here.  Total time spent: 30 mins including face to face time and time spent for  planning, charting and coordination of care  Rulon Eisenmenger, MD, MPH 07/01/2021  I, Thana Ates, am acting as scribe for Dr. Nicholas Lose.  I have reviewed the above documentation for accuracy and completeness, and I agree with the above.

## 2021-07-01 ENCOUNTER — Telehealth: Payer: Self-pay | Admitting: Licensed Clinical Social Worker

## 2021-07-01 ENCOUNTER — Other Ambulatory Visit: Payer: Self-pay

## 2021-07-01 ENCOUNTER — Inpatient Hospital Stay: Payer: Medicare Other | Attending: Internal Medicine | Admitting: Hematology and Oncology

## 2021-07-01 DIAGNOSIS — Z853 Personal history of malignant neoplasm of breast: Secondary | ICD-10-CM | POA: Diagnosis not present

## 2021-07-01 DIAGNOSIS — Z923 Personal history of irradiation: Secondary | ICD-10-CM | POA: Insufficient documentation

## 2021-07-01 DIAGNOSIS — C50411 Malignant neoplasm of upper-outer quadrant of right female breast: Secondary | ICD-10-CM | POA: Diagnosis present

## 2021-07-01 DIAGNOSIS — Z01810 Encounter for preprocedural cardiovascular examination: Secondary | ICD-10-CM | POA: Insufficient documentation

## 2021-07-01 DIAGNOSIS — Z171 Estrogen receptor negative status [ER-]: Secondary | ICD-10-CM | POA: Insufficient documentation

## 2021-07-01 NOTE — Assessment & Plan Note (Signed)
06/18/2021:Right lumpectomy: Grade 3 IDC 1.5 cm, high-grade DCIS, margins negative, 0/2 lymph nodes negative, ER 0%, PR 0%, HER2 3+ positive, Ki-67 30%   (2013 Right breast cancer: Stage Ia ER/PR positive HER2 negative status postlumpectomy, radiation (low risk Oncotype) could not tolerate tamoxifen for more than 30 days.)  Pathology counseling: I discussed the final pathology report of the patient provided  a copy of this report. I discussed the margins.  We also discussed the final staging along with previously performed ER/PR testing.  Treatment plan: 1.  Adjuvant chemotherapy with Taxol and Herceptin followed by Herceptin maintenance 2. discussion regarding pros and cons of reirradiation  Return to clinic in 3 weeks to start chemotherapy

## 2021-07-02 ENCOUNTER — Telehealth: Payer: Self-pay | Admitting: Hematology and Oncology

## 2021-07-02 ENCOUNTER — Encounter: Payer: Self-pay | Admitting: *Deleted

## 2021-07-02 ENCOUNTER — Ambulatory Visit: Payer: Self-pay | Admitting: Licensed Clinical Social Worker

## 2021-07-02 ENCOUNTER — Encounter: Payer: Self-pay | Admitting: Licensed Clinical Social Worker

## 2021-07-02 DIAGNOSIS — C50411 Malignant neoplasm of upper-outer quadrant of right female breast: Secondary | ICD-10-CM

## 2021-07-02 DIAGNOSIS — D420 Neoplasm of uncertain behavior of cerebral meninges: Secondary | ICD-10-CM

## 2021-07-02 DIAGNOSIS — Z8041 Family history of malignant neoplasm of ovary: Secondary | ICD-10-CM

## 2021-07-02 DIAGNOSIS — Z1379 Encounter for other screening for genetic and chromosomal anomalies: Secondary | ICD-10-CM

## 2021-07-02 DIAGNOSIS — Z808 Family history of malignant neoplasm of other organs or systems: Secondary | ICD-10-CM

## 2021-07-02 DIAGNOSIS — Z803 Family history of malignant neoplasm of breast: Secondary | ICD-10-CM

## 2021-07-02 DIAGNOSIS — Z800 Family history of malignant neoplasm of digestive organs: Secondary | ICD-10-CM

## 2021-07-02 DIAGNOSIS — Z85828 Personal history of other malignant neoplasm of skin: Secondary | ICD-10-CM

## 2021-07-02 NOTE — Telephone Encounter (Signed)
Patient called back and wanted to move appointment to same day, gave appointment times and location

## 2021-07-02 NOTE — Telephone Encounter (Signed)
Called patient to see if she wanted to move her education class to the same day as echo appointment to avoid having to come to appointments on different days, I left my call back for a return call

## 2021-07-02 NOTE — Telephone Encounter (Signed)
Revealed negative genetic testing.  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  We discussed that we do not know why she has had cancer or why there is cancer in the family. It could be due to a different gene that we are not testing, or something our current technology cannot pick up.  It will be important for her to keep in contact with genetics to learn if additional testing may be needed in the future.

## 2021-07-02 NOTE — Progress Notes (Signed)
HPI:  Ms. Veilleux was previously seen in the [facility] due to a personal and family history of cancer and concerns regarding a hereditary predisposition to cancer. Please refer to our prior cancer genetics clinic note for more information regarding our discussion, assessment and recommendations, at the time. Ms. Vosler's recent genetic test results were disclosed to her, as were recommendations warranted by these results. These results and recommendations are discussed in more detail below.  In February of 2013, at the age of 50, Ms. 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 The tumor was ER+/PR+/Her2-. The treatment plan included lumpectomy, oncotype, radiation therapy, and antiestrogen therapy. She had negative genetic testing of the BRCA1 and BRCA2 genes in 2013.   In December of 2018, at the age of 56, Ms. 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   In 2020, Ms. 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   Most recently, in July of 2022 at the age of 60, Ms. Bors was diagnosed with invasive ductal carcinoma of the right breast. The tumor is ER-/PR-/Her2+.    CANCER HISTORY:  Oncology History  Cancer of upper-outer quadrant of female breast (HCC)  2013 Initial Diagnosis   Right breast cancer: Stage Ia ER/PR positive HER2 negative status postlumpectomy, radiation (low risk Oncotype) could not tolerate tamoxifen for more than 30 days.   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  Biopsy revealed grade 3 IDC ER/PR negative HER2 positive with a Ki-67 of 30%   05/26/2021 Cancer Staging    Staging form: Breast, AJCC 7th Edition - Clinical stage from 05/26/2021: Stage Unknown (T1c, NX, cM0) - Signed by Squire, Sarah, MD on 05/28/2021 Specimen type: Core Needle Biopsy Stage prefix: Initial diagnosis Laterality: Right Tumor grade (Scarff-Bloom-Richardson system): G3 Estrogen receptor status: Negative Progesterone receptor status: Negative HER2 status: Positive Staging comments: Staged at Breast Cancer Conference 12/08/11      06/18/2021 Surgery   Right lumpectomy: Grade 3 IDC 1.5 cm, high-grade DCIS, margins negative, 0/2 lymph nodes negative, ER 0%, PR 0%, HER2 3+ positive, Ki-67 30%    Genetic Testing   Negative genetic testing. No pathogenic variants identified on the [facility] Multi-Cancer Panel+RNA. VUS in POLD1 identified called c.2429C>T. The report date is 07/01/2021.  The Multi-Cancer Panel + RNA offered by [facility] includes sequencing and/or deletion duplication testing of the following 84 genes: AIP, ALK, APC, ATM, AXIN2,BAP1,  BARD1, BLM, BMPR1A, BRCA1, BRCA2, BRIP1, CASR, CDC73, CDH1, CDK4, CDKN1B, CDKN1C, CDKN2A (p14ARF), CDKN2A (p16INK4a), CEBPA, CHEK2, CTNNA1, DICER1, DIS3L2, EGFR (c.2369C>T, p.Thr790Met variant only), EPCAM (Deletion/duplication testing only), FH, FLCN, GATA2, GPC3, GREM1 (Promoter region deletion/duplication testing only), HOXB13 (c.251G>A, p.Gly84Glu), HRAS, KIT, MAX, MEN1, MET, MITF (c.952G>A, p.Glu318Lys variant only), MLH1, MSH2, MSH3, MSH6, MUTYH, NBN, NF1, NF2, NTHL1, PALB2, PDGFRA, PHOX2B, PMS2, POLD1, POLE, POT1, PRKAR1A, PTCH1, PTEN, RAD50, RAD51C, RAD51D, RB1, RECQL4, RET, RUNX1, SDHAF2, SDHA (sequence changes only), SDHB, SDHC, SDHD, SMAD4, SMARCA4, SMARCB1, SMARCE1, STK11, SUFU, TERC, TERT, TMEM127, TP53, TSC1, TSC2, VHL, WRN and WT1.     FAMILY HISTORY:  We obtained a detailed, 4-generation family history.  Significant diagnoses are listed below: Family History    Problem Relation Age of Onset   Anesthesia problems Mother    Breast cancer  Mother 70       lumpectomy, chemo, radiation   Ovarian cancer Maternal Aunt 70   Cancer Maternal Aunt        unknown type   Breast cancer Maternal Aunt 80   Breast cancer Maternal Aunt 40       bilateral   Pancreatic cancer Maternal Uncle        dx 60s   Stomach cancer Paternal Uncle    Cancer Paternal Uncle        unknown type   Lung cancer Paternal Uncle        hx smoking   Dementia Maternal Grandmother    Lung cancer Maternal Grandfather        hx smoking   Pancreatic cancer Paternal Grandmother 28   Melanoma Paternal Grandmother        dx >50, shin   Heart Problems Paternal Grandfather    Colon cancer Cousin 38       maternal first cousin   Breast cancer Cousin        dx 55s, paternal first cousin    Ms. Kurihara does not have children. She has one brother (age 74) and one sister (age 57). Neither of her siblings have had cancer.   Ms. Kuba's mother is alive at age 44 and had breast cancer at age 16. There were five maternal aunts and two maternal uncles. One aunt had ovarian cancer at age 54, one aunt died from an unknown cancer in her 35s, two aunts had breast cancer - one at age 71 and the other in her 35s. One uncle died from pancreatic cancer in his 66s. A maternal cousin had colon cancer at age 59. Ms. Connolly's maternal grandmother died in her 21s without cancer. Her maternal grandfather died older than 32 with lung cancer.   Ms. Kenley's father died at age 48 without cancer. There was one paternal aunt and five paternal uncles (all half-brothers to her father). One uncle had stomach cancer, one uncle had lung cancer, and one uncle had an unknown cancer. One paternal cousin had breast cancer in her 27s. Ms. Wiegert's paternal grandmother died at age 40 from pancreatic cancer and also had melanoma diagnosed older than 69. Her paternal grandfather died older than 80 without cancer.    Ms. Santy is unaware of previous family history of genetic testing for  hereditary cancer risks. Patient's maternal ancestors are of [place] and [place] descent, and paternal ancestors are of [place] and [place] descent. There is no reported Ashkenazi Jewish ancestry. There is no known consanguinity.  GENETIC TEST RESULTS: Genetic testing reported out on 07/01/2021 through the [facility] Multi-Cancer+RNA cancer panel found no pathogenic mutations.   The Multi-Cancer Panel + RNA offered by [facility] includes sequencing and/or deletion duplication testing of the following 84 genes: AIP, ALK, APC, ATM, AXIN2,BAP1,  BARD1, BLM, BMPR1A, BRCA1, BRCA2, BRIP1, CASR, CDC73, CDH1, CDK4, CDKN1B, CDKN1C, CDKN2A (p14ARF), CDKN2A (p16INK4a), CEBPA, CHEK2, CTNNA1, DICER1, DIS3L2, EGFR (c.2369C>T, p.Thr790Met variant only), EPCAM (Deletion/duplication testing only), FH, FLCN, GATA2, GPC3, GREM1 (Promoter region deletion/duplication testing only), HOXB13 (c.251G>A, p.Gly84Glu), HRAS, KIT, MAX, MEN1, MET, MITF (c.952G>A, p.Glu318Lys variant only), MLH1, MSH2, MSH3, MSH6, MUTYH, NBN, NF1, NF2, NTHL1, PALB2, PDGFRA, PHOX2B, PMS2, POLD1, POLE, POT1, PRKAR1A, PTCH1, PTEN, RAD50, RAD51C, RAD51D, RB1, RECQL4, RET, RUNX1, SDHAF2, SDHA (sequence changes only), SDHB, SDHC, SDHD, SMAD4, SMARCA4, SMARCB1, SMARCE1, STK11, SUFU, TERC, TERT, TMEM127, TP53, TSC1,  TSC2, VHL, WRN and WT1.  The test report has been scanned into EPIC and is located under the Molecular Pathology section of the Results Review tab.  A portion of the result report is included below for reference.     We discussed that because current genetic testing is not perfect, it is possible there may be a gene mutation in one of these genes that current testing cannot detect, but that chance is small.  There could be another gene that has not yet been discovered, or that we have not yet tested, that is responsible for the cancer diagnoses in the family. It is also possible there is a hereditary cause for the cancer in the family that Ms. Kachel did  not inherit and therefore was not identified in her testing.  Therefore, it is important to remain in touch with cancer genetics in the future so that we can continue to offer Ms. Davlin the most up to date genetic testing.   Genetic testing did identify a variant of uncertain significance (VUS) in the POLD1 gene called c.2429C>T.  At this time, it is unknown if this variant is associated with increased cancer risk or if this is a normal finding, but most variants such as this get reclassified to being inconsequential. It should not be used to make medical management decisions. With time, we suspect the lab will determine the significance of this variant, if any. If we do learn more about it we will try to contact Ms. Shugrue to discuss it further. However, it is important to stay in touch with us periodically and keep the address and phone number up to date.  ADDITIONAL GENETIC TESTING: We discussed with Ms. Hyslop that her genetic testing was fairly extensive.  If there are genes identified to increase cancer risk that can be analyzed in the future, we would be happy to discuss and coordinate this testing at that time.    CANCER SCREENING RECOMMENDATIONS: Ms. Schepers's test result is considered negative (normal).  This means that we have not identified a hereditary cause for her  personal and family history of cancer at this time.   While reassuring, this does not definitively rule out a hereditary predisposition to cancer. It is still possible that there could be genetic mutations that are undetectable by current technology. There could be genetic mutations in genes that have not been tested or identified to increase cancer risk.  Therefore, it is recommended she continue to follow the cancer management and screening guidelines provided by her oncology and primary healthcare provider.   An individual's cancer risk and medical management are not determined by genetic test results alone. Overall  cancer risk assessment incorporates additional factors, including personal medical history, family history, and any available genetic information that may result in a personalized plan for cancer prevention and surveillance.  Given Ms. Tschetter's personal and family histories, we must interpret these negative results with some caution.  Families with features suggestive of hereditary risk for cancer tend to have multiple family members with cancer, diagnoses in multiple generations and diagnoses before the age of 50. Ms. Keelin's family exhibits some of these features. Thus, this result may simply reflect our current inability to detect all mutations within these genes or there may be a different gene that has not yet been discovered or tested.   RECOMMENDATIONS FOR FAMILY MEMBERS:  Relatives in this family might be at some increased risk of developing cancer, over the general population risk, simply due to the   family history of cancer.  We recommended female relatives in this family have a yearly mammogram beginning at age 40, or 10 years younger than the earliest onset of cancer, an annual clinical breast exam, and perform monthly breast self-exams. Female relatives in this family should also have a gynecological exam as recommended by their primary provider.  All family members should be referred for colonoscopy starting at age 45.    It is also possible there is a hereditary cause for the cancer in Ms. Zenz's family that she did not inherit and therefore was not identified in her.  Based on Ms. Batie's family history, we recommended both maternal and paternal relatives have genetic counseling and testing. Ms. Birman will let us know if we can be of any assistance in coordinating genetic counseling and/or testing for these family members.  FOLLOW-UP: Lastly, we discussed with Ms. Lapka that cancer genetics is a rapidly advancing field and it is possible that new genetic tests will be  appropriate for her and/or her family members in the future. We encouraged her to remain in contact with cancer genetics on an annual basis so we can update her personal and family histories and let her know of advances in cancer genetics that may benefit this family.   Our contact number was provided. Ms. Maready's questions were answered to her satisfaction, and she knows she is welcome to call us at anytime with additional questions or concerns.   Brianna Cowan, MS, LCGC Genetic Counselor [email or web address] Phone: [phone]  

## 2021-07-02 NOTE — Telephone Encounter (Signed)
Scheduled appointment per 09/13 los. Left message.

## 2021-07-03 ENCOUNTER — Encounter: Payer: Self-pay | Admitting: *Deleted

## 2021-07-07 ENCOUNTER — Other Ambulatory Visit: Payer: Medicare Other

## 2021-07-09 ENCOUNTER — Inpatient Hospital Stay: Payer: Medicare Other | Admitting: Internal Medicine

## 2021-07-09 ENCOUNTER — Inpatient Hospital Stay (HOSPITAL_BASED_OUTPATIENT_CLINIC_OR_DEPARTMENT_OTHER): Payer: Medicare Other | Admitting: Internal Medicine

## 2021-07-09 DIAGNOSIS — R569 Unspecified convulsions: Secondary | ICD-10-CM

## 2021-07-09 DIAGNOSIS — D420 Neoplasm of uncertain behavior of cerebral meninges: Secondary | ICD-10-CM

## 2021-07-10 NOTE — Progress Notes (Signed)
I connected with Lori Mcmillan on 07/10/21 at  2:00 PM EDT by telephone visit and verified that I am speaking with the correct person using two identifiers.  I discussed the limitations, risks, security and privacy concerns of performing an evaluation and management service by telemedicine and the availability of in-person appointments. I also discussed with the patient that there may be a patient responsible charge related to this service. The patient expressed understanding and agreed to proceed.  Other persons participating in the visit and their role in the encounter:  n/a  Patient's location:  Home  Provider's location:  Office  Chief Complaint:  Atypical meningioma of brain ([place])  Seizures ([place])  History of Present Ilness: Lori Mcmillan describes improvement in insomnia since decreasing Lamictal to 50mg  daily. She has not experienced further seizures.  Recently had breast cancer surgery and has had soft tissue infection requiring antibiotics.  Scheduled to begin chemotherapy soon. Observations: Language and cognition at baseline Assessment and Plan: Atypical meningioma of brain (HCC)  Seizures (HCC)  Clinically stable on lower dose of Lamictal, without insomnia side effect.  Will con't this dose.    Follow Up Instructions: RTC in 9 months with MRI brain for evaluation, or sooner if needed  I discussed the assessment and treatment plan with the patient.  The patient was provided an opportunity to ask questions and all were answered.  The patient agreed with the plan and demonstrated understanding of the instructions.    The patient was advised to call back or seek an in-person evaluation if the symptoms worsen or if the condition fails to improve as anticipated.  I provided 5-10 minutes of non-face-to-face time during this enocunter.  Ventura Sellers, MD   I provided 15 minutes of non face-to-face telephone visit time during this encounter, and > 50% was spent counseling as  documented under my assessment & plan.

## 2021-07-13 ENCOUNTER — Telehealth: Payer: Self-pay | Admitting: Internal Medicine

## 2021-07-13 NOTE — Telephone Encounter (Signed)
Scheduled appt per 9/22 los. Mailed updated calendar to pt.

## 2021-07-14 ENCOUNTER — Other Ambulatory Visit: Payer: Self-pay | Admitting: Hematology and Oncology

## 2021-07-14 DIAGNOSIS — C50411 Malignant neoplasm of upper-outer quadrant of right female breast: Secondary | ICD-10-CM

## 2021-07-14 MED ORDER — ONDANSETRON HCL 8 MG PO TABS: 8.0000 mg | ORAL_TABLET | Freq: Two times a day (BID) | ORAL | 1 refills | Status: DC | PRN

## 2021-07-14 MED ORDER — LIDOCAINE-PRILOCAINE 2.5-2.5 % EX CREA: TOPICAL_CREAM | CUTANEOUS | 3 refills | Status: DC

## 2021-07-14 MED ORDER — PROCHLORPERAZINE MALEATE 10 MG PO TABS: 10.0000 mg | ORAL_TABLET | Freq: Four times a day (QID) | ORAL | 1 refills | Status: DC | PRN

## 2021-07-14 NOTE — Progress Notes (Signed)
START ON PATHWAY REGIMEN - Breast     Cycle 1: A cycle is 7 days:     Trastuzumab-xxxx      Paclitaxel    Cycles 2 through 12: A cycle is every 7 days:     Trastuzumab-xxxx      Paclitaxel    Cycles 13 through 25: A cycle is every 21 days:     Trastuzumab-xxxx   **Always confirm dose/schedule in your pharmacy ordering system**  Patient Characteristics: Postoperative without Neoadjuvant Therapy (Pathologic Staging), Invasive Disease, Adjuvant Therapy, HER2 Positive, ER Negative/Unknown, Node Negative, pT1c, pN0/N1mi Therapeutic Status: Postoperative without Neoadjuvant Therapy (Pathologic Staging) AJCC Grade: G3 AJCC N Category: pN0 AJCC M Category: cM0 ER Status: Negative (-) AJCC 8 Stage Grouping: IA HER2 Status: Positive (+) Oncotype Dx Recurrence Score: Not Appropriate AJCC T Category: pT1c PR Status: Negative (-) Adjuvant Therapy Status: No Adjuvant Therapy Received Yet or Changing Initial Adjuvant Regimen due to Tolerance Intent of Therapy: Curative Intent, Discussed with Patient 

## 2021-07-14 NOTE — Progress Notes (Signed)
Pharmacist Chemotherapy Monitoring - Initial Assessment    Anticipated start date: 07/21/21   The following has been reviewed per standard work regarding the patient's treatment regimen: The patient's diagnosis, treatment plan and drug doses, and organ/hematologic function Lab orders and baseline tests specific to treatment regimen  The treatment plan start date, drug sequencing, and pre-medications Prior authorization status  Patient's documented medication list, including drug-drug interaction screen and prescriptions for anti-emetics and supportive care specific to the treatment regimen The drug concentrations, fluid compatibility, administration routes, and timing of the medications to be used The patient's access for treatment and lifetime cumulative dose history, if applicable  The patient's medication allergies and previous infusion related reactions, if applicable   Changes made to treatment plan:  The following biosimilar Kanjinti (trastuzumab-anns) has been selected for use in this patient.   Follow up needed:  Pending authorization for treatment  ECHO pending.  Will f/u result.   Kennith Center, Pharm.D., CPP 07/14/2021@1 :29 PM

## 2021-07-15 ENCOUNTER — Encounter: Payer: Self-pay | Admitting: Hematology and Oncology

## 2021-07-15 ENCOUNTER — Inpatient Hospital Stay: Payer: Medicare Other

## 2021-07-15 ENCOUNTER — Other Ambulatory Visit: Payer: Self-pay | Admitting: Hematology and Oncology

## 2021-07-15 ENCOUNTER — Other Ambulatory Visit: Payer: Self-pay

## 2021-07-15 ENCOUNTER — Ambulatory Visit (HOSPITAL_COMMUNITY)
Admission: RE | Admit: 2021-07-15 | Discharge: 2021-07-15 | Disposition: A | Discharge status: Home / Self Care | Payer: Medicare Other | Source: Ambulatory Visit | Attending: Hematology and Oncology | Admitting: Hematology and Oncology

## 2021-07-15 DIAGNOSIS — Z01810 Encounter for preprocedural cardiovascular examination: Secondary | ICD-10-CM | POA: Insufficient documentation

## 2021-07-15 DIAGNOSIS — C50411 Malignant neoplasm of upper-outer quadrant of right female breast: Secondary | ICD-10-CM | POA: Insufficient documentation

## 2021-07-15 DIAGNOSIS — Z0189 Encounter for other specified special examinations: Secondary | ICD-10-CM | POA: Diagnosis not present

## 2021-07-15 DIAGNOSIS — Z01818 Encounter for other preprocedural examination: Secondary | ICD-10-CM | POA: Insufficient documentation

## 2021-07-15 LAB — ECHOCARDIOGRAM COMPLETE
Area-P 1/2: 4.49 cm2
S' Lateral: 2.7 cm

## 2021-07-15 MED ORDER — ALPRAZOLAM 0.25 MG PO TABS: 0.2500 mg | ORAL_TABLET | Freq: Two times a day (BID) | ORAL | 1 refills | Status: DC | PRN

## 2021-07-15 NOTE — Progress Notes (Signed)
  Echocardiogram 2D Echocardiogram has been performed.  Fidel Levy 07/15/2021, 9:58 AM

## 2021-07-15 NOTE — Progress Notes (Signed)
Severe anxiety: Sent a prescription for Xanax.

## 2021-07-15 NOTE — Progress Notes (Signed)
Met with patient at registration to introduce myself as Financial Resource Specialist and to offer available resources.  Discussed one-time $1000 [employer] grant and qualifications to assist with personal expenses while going through treatment.  Gave her my card if interested in applying and for any additional financial questions or concerns.  

## 2021-07-20 MED FILL — Dexamethasone Sodium Phosphate Inj 100 MG/10ML: INTRAMUSCULAR | Qty: 1 | Status: AC

## 2021-07-20 NOTE — Progress Notes (Signed)
Patient Care Team: Luetta Nutting, DO as PCP - General (Family Medicine) Eppie Gibson, MD as Consulting Physician (Radiation Oncology) Mickeal Skinner Acey Lav, MD as Consulting Physician (Oncology) [facility] (Ophthalmology) Bridget Hartshorn, Noel Journey, MD as Referring Physician (Dermatology) Helayne Seminole, MD as Referring Physician (Otolaryngology) Mauro Kaufmann, RN as Oncology Nurse Navigator Rockwell Germany, RN as Oncology Nurse Navigator  DIAGNOSIS:    ICD-10-CM   1. Malignant neoplasm of upper-outer quadrant of right female breast, unspecified estrogen receptor status ([place])  C50.411       SUMMARY OF ONCOLOGIC HISTORY: Oncology History  Cancer of upper-outer quadrant of female breast ([place])  2013 Initial Diagnosis   Right breast cancer: Stage Ia ER/PR positive HER2 negative status postlumpectomy, radiation (low risk Oncotype) could not tolerate tamoxifen for more than 30 days.   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  Biopsy revealed grade 3 IDC ER/PR negative HER2 positive with a Ki-67 of 30%   05/26/2021 Cancer Staging   Staging form: Breast, AJCC 7th Edition - Clinical stage from 05/26/2021: Stage Unknown (T1c, NX, cM0) - Signed by Eppie Gibson, MD on 05/28/2021 Specimen type: Core Needle Biopsy Stage prefix: Initial diagnosis Laterality: Right Tumor grade (Scarff-Bloom-Richardson system): G3 Estrogen receptor status: Negative Progesterone receptor status: Negative HER2 status: Positive Staging comments: Staged at Breast Cancer Conference 12/08/11      06/18/2021 Surgery   Right lumpectomy: Grade 3 IDC 1.5 cm, high-grade DCIS, margins negative, 0/2 lymph nodes negative, ER 0%, PR 0%, HER2 3+ positive, Ki-67 30%    Genetic Testing    Negative genetic testing. No pathogenic variants identified on the [facility] Multi-Cancer Panel+RNA. VUS in POLD1 identified called c.2429C>T. The report date is 07/01/2021.  The Multi-Cancer Panel + RNA offered by [facility] includes sequencing and/or deletion duplication testing of the following 84 genes: AIP, ALK, APC, ATM, AXIN2,BAP1,  BARD1, BLM, BMPR1A, BRCA1, BRCA2, BRIP1, CASR, CDC73, CDH1, CDK4, CDKN1B, CDKN1C, CDKN2A (p14ARF), CDKN2A (p16INK4a), CEBPA, CHEK2, CTNNA1, DICER1, DIS3L2, EGFR (c.2369C>T, p.Thr790Met variant only), EPCAM (Deletion/duplication testing only), FH, FLCN, GATA2, GPC3, GREM1 (Promoter region deletion/duplication testing only), HOXB13 (c.251G>A, p.Gly84Glu), HRAS, KIT, MAX, MEN1, MET, MITF (c.952G>A, p.Glu318Lys variant only), MLH1, MSH2, MSH3, MSH6, MUTYH, NBN, NF1, NF2, NTHL1, PALB2, PDGFRA, PHOX2B, PMS2, POLD1, POLE, POT1, PRKAR1A, PTCH1, PTEN, RAD50, RAD51C, RAD51D, RB1, RECQL4, RET, RUNX1, SDHAF2, SDHA (sequence changes only), SDHB, SDHC, SDHD, SMAD4, SMARCA4, SMARCB1, SMARCE1, STK11, SUFU, TERC, TERT, TMEM127, TP53, TSC1, TSC2, VHL, WRN and WT1.   07/21/2021 -  Chemotherapy   Patient is on Treatment Plan : BREAST Paclitaxel + Trastuzumab q7d / Trastuzumab q21d       CHIEF COMPLIANT: Cycle 1 Herceptin  INTERVAL HISTORY: Lori Mcmillan is a 60 y.o. with above-mentioned history of right breast cancer having undergone lumpectomy, currently on chemotherapy with Herceptin. She presents to the clinic today for Cycle 1.  She is slightly anxious today but she took her Xanax and she feels a lot more stable.  ALLERGIES:  is allergic to lidocaine.  MEDICATIONS:  Current Outpatient Medications  Medication Sig Dispense Refill   ALPRAZolam (XANAX) 0.25 MG tablet Take 1 tablet (0.25 mg total) by mouth 2 (two) times daily as needed for anxiety. 30 tablet 1   diazepam (VALIUM) 2 MG tablet  Take by mouth.     gabapentin (NEURONTIN) 300 MG capsule Take 2 capsules (600 mg total) by  mouth 3 (three) times daily. 90 capsule 0   ibuprofen (ADVIL) 800 MG tablet Take 1 tablet (800 mg total) by mouth every 8 (eight) hours as needed. 30 tablet 0   lamoTRIgine (LAMICTAL) 25 MG tablet Take 2 tablets (50 mg total) by mouth daily. 60 tablet 3   lidocaine-prilocaine (EMLA) cream Apply to affected area once 30 g 3   ondansetron (ZOFRAN) 8 MG tablet Take 1 tablet (8 mg total) by mouth 2 (two) times daily as needed (Nausea or vomiting). 30 tablet 1   oxyCODONE (OXY IR/ROXICODONE) 5 MG immediate release tablet Take 1 tablet (5 mg total) by mouth every 6 (six) hours as needed for severe pain. 15 tablet 0   prochlorperazine (COMPAZINE) 10 MG tablet Take 1 tablet (10 mg total) by mouth every 6 (six) hours as needed (Nausea or vomiting). 30 tablet 1   valACYclovir (VALTREX) 1000 MG tablet Take 1 tablet (1,000 mg total) by mouth 3 (three) times daily. 21 tablet 0   No current facility-administered medications for this visit.    PHYSICAL EXAMINATION: ECOG PERFORMANCE STATUS: 1 - Symptomatic but completely ambulatory  Vitals:   07/21/21 0941  BP: 139/64  Pulse: 75  Resp: 18  Temp: 97.8 F (36.6 C)  SpO2: 100%   Filed Weights   07/21/21 0941  Weight: 160 lb 8 oz (72.8 kg)    LABORATORY DATA:  I have reviewed the data as listed CMP Latest Ref Rng & Units 06/17/2021 01/18/2021 11/23/2019  Glucose 70 - 99 mg/dL 86 99 -  BUN 6 - 20 mg/dL $Remove'8 15 9  'Oxopbpa$ Creatinine 0.44 - 1.00 mg/dL 0.61 0.57 0.7  Sodium 135 - 145 mmol/L 136 137 140  Potassium 3.5 - 5.1 mmol/L 4.4 3.9 4.3  Chloride 98 - 111 mmol/L 105 106 104  CO2 22 - 32 mmol/L $RemoveB'23 23 19  'RJXJBjik$ Calcium 8.9 - 10.3 mg/dL 9.1 8.9 9.3  Total Protein 6.5 - 8.1 g/dL 6.9 7.1 -  Total Bilirubin 0.3 - 1.2 mg/dL 0.6 0.7 -  Alkaline Phos 38 - 126 U/L 78 76 122  AST 15 - 41 U/L $Remo'21 18 17  'yqEkf$ ALT 0 - 44 U/L $Remo'15 18 14    'yuzdq$ Lab Results  Component Value Date   WBC 7.6 07/21/2021   HGB 11.9 (L) 07/21/2021   HCT 35.8 (L) 07/21/2021   MCV 90.2 07/21/2021   PLT 292  07/21/2021   NEUTROABS 4.7 07/21/2021    ASSESSMENT & PLAN:  Cancer of upper-outer quadrant of female breast ([place]) 06/18/2021:Right lumpectomy: Grade 3 IDC 1.5 cm, high-grade DCIS, margins negative, 0/2 lymph nodes negative, ER 0%, PR 0%, HER2 3+ positive, Ki-67 30%    (2013 Right breast cancer: Stage Ia ER/PR positive HER2 negative status postlumpectomy, radiation (low risk Oncotype) could not tolerate tamoxifen for more than 30 days.)  Treatment plan: 1.  Adjuvant chemotherapy with Taxol and Herceptin followed by Herceptin maintenance 2. discussion regarding pros and cons of reirradiation (Dr. Isidore Moos will need to inform us if she is eligible for radiation given her prior history of radiation therapy to the breast) ----------------------------------------------------------------------------------------------------------------------------------- Current treatment: Cycle 1 day 1 Taxol Herceptin Labs remote, chemo education completed, chemo consent obtained, antiemetics were reviewed Echocardiogram: EF 60 to 65%  Return to clinic in 1 week for cycle 2 and toxicity check    No orders of the defined types were placed in  this encounter.  The patient has a good understanding of the overall plan. she agrees with it. she will call with any problems that may develop before the next visit here.  Total time spent: 30 mins including face to face time and time spent for planning, charting and coordination of care  Rulon Eisenmenger, MD, MPH 07/21/2021  I, Thana Ates, am acting as scribe for Dr. Nicholas Lose.  I have reviewed the above documentation for accuracy and completeness, and I agree with the above.

## 2021-07-21 ENCOUNTER — Encounter: Payer: Self-pay | Admitting: *Deleted

## 2021-07-21 ENCOUNTER — Inpatient Hospital Stay: Payer: Medicare Other | Attending: Internal Medicine

## 2021-07-21 ENCOUNTER — Encounter: Payer: Self-pay | Admitting: Hematology and Oncology

## 2021-07-21 ENCOUNTER — Inpatient Hospital Stay (HOSPITAL_BASED_OUTPATIENT_CLINIC_OR_DEPARTMENT_OTHER): Payer: Medicare Other | Admitting: Hematology and Oncology

## 2021-07-21 ENCOUNTER — Other Ambulatory Visit: Payer: Self-pay

## 2021-07-21 ENCOUNTER — Inpatient Hospital Stay: Payer: Medicare Other

## 2021-07-21 VITALS — BP 118/78 | HR 62 | Temp 98.2°F | Resp 18

## 2021-07-21 DIAGNOSIS — R519 Headache, unspecified: Secondary | ICD-10-CM | POA: Diagnosis not present

## 2021-07-21 DIAGNOSIS — Z5111 Encounter for antineoplastic chemotherapy: Secondary | ICD-10-CM | POA: Insufficient documentation

## 2021-07-21 DIAGNOSIS — C50411 Malignant neoplasm of upper-outer quadrant of right female breast: Secondary | ICD-10-CM

## 2021-07-21 DIAGNOSIS — Z853 Personal history of malignant neoplasm of breast: Secondary | ICD-10-CM | POA: Diagnosis not present

## 2021-07-21 DIAGNOSIS — Z923 Personal history of irradiation: Secondary | ICD-10-CM | POA: Insufficient documentation

## 2021-07-21 DIAGNOSIS — D6481 Anemia due to antineoplastic chemotherapy: Secondary | ICD-10-CM | POA: Insufficient documentation

## 2021-07-21 DIAGNOSIS — Z170 Estrogen receptor positive status [ER+]: Secondary | ICD-10-CM | POA: Insufficient documentation

## 2021-07-21 DIAGNOSIS — R5383 Other fatigue: Secondary | ICD-10-CM | POA: Insufficient documentation

## 2021-07-21 DIAGNOSIS — R110 Nausea: Secondary | ICD-10-CM | POA: Diagnosis not present

## 2021-07-21 DIAGNOSIS — Z95828 Presence of other vascular implants and grafts: Secondary | ICD-10-CM | POA: Insufficient documentation

## 2021-07-21 LAB — CMP (CANCER CENTER ONLY)
ALT: 16 U/L (ref 0–44)
AST: 14 U/L — ABNORMAL LOW (ref 15–41)
Albumin: 3.8 g/dL (ref 3.5–5.0)
Alkaline Phosphatase: 95 U/L (ref 38–126)
Anion gap: 10 (ref 5–15)
BUN: 13 mg/dL (ref 6–20)
CO2: 23 mmol/L (ref 22–32)
Calcium: 9.1 mg/dL (ref 8.9–10.3)
Chloride: 106 mmol/L (ref 98–111)
Creatinine: 0.66 mg/dL (ref 0.44–1.00)
GFR, Estimated: >60 mL/min (ref >60)
Glucose, Bld: 94 mg/dL (ref 70–99)
Potassium: 3.8 mmol/L (ref 3.5–5.1)
Sodium: 139 mmol/L (ref 135–145)
Total Bilirubin: 0.4 mg/dL (ref 0.3–1.2)
Total Protein: 6.9 g/dL (ref 6.5–8.1)

## 2021-07-21 LAB — CBC WITH DIFFERENTIAL (CANCER CENTER ONLY)
Abs Immature Granulocytes: 0.02 10*3/uL (ref 0.00–0.07)
Basophils Absolute: 0.1 10*3/uL (ref 0.0–0.1)
Basophils Relative: 1 %
Eosinophils Absolute: 0.2 10*3/uL (ref 0.0–0.5)
Eosinophils Relative: 2 %
HCT: 35.8 % — ABNORMAL LOW (ref 36.0–46.0)
Hemoglobin: 11.9 g/dL — ABNORMAL LOW (ref 12.0–15.0)
Immature Granulocytes: 0 %
Lymphocytes Relative: 27 %
Lymphs Abs: 2 10*3/uL (ref 0.7–4.0)
MCH: 30 pg (ref 26.0–34.0)
MCHC: 33.2 g/dL (ref 30.0–36.0)
MCV: 90.2 fL (ref 80.0–100.0)
Monocytes Absolute: 0.6 10*3/uL (ref 0.1–1.0)
Monocytes Relative: 8 %
Neutro Abs: 4.7 10*3/uL (ref 1.7–7.7)
Neutrophils Relative %: 62 %
Platelet Count: 292 10*3/uL (ref 150–400)
RBC: 3.97 MIL/uL (ref 3.87–5.11)
RDW: 13.3 % (ref 11.5–15.5)
WBC Count: 7.6 10*3/uL (ref 4.0–10.5)
nRBC: 0 % (ref 0.0–0.2)

## 2021-07-21 MED ORDER — DIPHENHYDRAMINE HCL 50 MG/ML IJ SOLN
50.0000 mg | Freq: Once | INTRAMUSCULAR | Status: AC
  Administered 2021-07-21: 50 mg via INTRAVENOUS
  Filled 2021-07-21: qty 1

## 2021-07-21 MED ORDER — FAMOTIDINE 20 MG IN NS 100 ML IVPB
20.0000 mg | Freq: Once | INTRAVENOUS | Status: AC
  Administered 2021-07-21: 20 mg via INTRAVENOUS
  Filled 2021-07-21: qty 100

## 2021-07-21 MED ORDER — SODIUM CHLORIDE 0.9 % IV SOLN
65.0000 mg/m2 | Freq: Once | INTRAVENOUS | Status: AC
  Administered 2021-07-21: 114 mg via INTRAVENOUS
  Filled 2021-07-21: qty 19

## 2021-07-21 MED ORDER — SODIUM CHLORIDE 0.9% FLUSH
10.0000 mL | Freq: Once | INTRAVENOUS | Status: AC
  Administered 2021-07-21: 10 mL

## 2021-07-21 MED ORDER — TRASTUZUMAB-ANNS CHEMO 150 MG IV SOLR
4.0000 mg/kg | Freq: Once | INTRAVENOUS | Status: AC
Start: 2021-07-21 — End: 2021-07-21
  Administered 2021-07-21: 294 mg via INTRAVENOUS
  Filled 2021-07-21: qty 14

## 2021-07-21 MED ORDER — SODIUM CHLORIDE 0.9 % IV SOLN
10.0000 mg | Freq: Once | INTRAVENOUS | Status: AC
  Administered 2021-07-21: 10 mg via INTRAVENOUS
  Filled 2021-07-21: qty 10

## 2021-07-21 MED ORDER — SODIUM CHLORIDE 0.9 % IV SOLN: Freq: Once | INTRAVENOUS | Status: AC

## 2021-07-21 MED ORDER — ACETAMINOPHEN 325 MG PO TABS
650.0000 mg | ORAL_TABLET | Freq: Once | ORAL | Status: AC
  Administered 2021-07-21: 650 mg via ORAL
  Filled 2021-07-21: qty 2

## 2021-07-21 NOTE — Assessment & Plan Note (Signed)
06/18/2021:Right lumpectomy: Grade 3 IDC 1.5 cm, high-grade DCIS, margins negative, 0/2 lymph nodes negative, ER 0%, PR 0%, HER2 3+ positive, Ki-67 30%   (2013 Right breast cancer: Stage Ia ER/PR positive HER2 negative status postlumpectomy, radiation (low risk Oncotype) could not tolerate tamoxifen for more than 30 days.)  Treatment plan: 1.  Adjuvant chemotherapy with Taxol and Herceptin followed by Herceptin maintenance 2. discussion regarding pros and cons of reirradiation (Dr. Isidore Moos will need to inform as if she is eligible for radiation given her prior history of radiation therapy to the breast) ----------------------------------------------------------------------------------------------------------------------------------- Current treatment: Cycle 1 day 1 Taxol Herceptin Labs remote, chemo education completed, chemo consent obtained, antiemetics were reviewed  Return to clinic in 1 week for cycle 2 and toxicity check

## 2021-07-21 NOTE — Progress Notes (Signed)
Met with patient at registration to obtain income for grant.  Patient approved for one-time $1000 [employer] grant to assist with personal expenses while going through treatment. Discussed in detail expenses and how they are covered. She has a copy of the expense sheet and approval letter along with the Outpatient pharmacy information. She received a gift card today from her grant.  She has my card for any additional financial questions or concerns.

## 2021-07-21 NOTE — Patient Instructions (Signed)
[facility]COL[facility]  Discharge Instructions: Thank you for choosing [facility] to provide your oncology and hematology care.   If you have a lab appointment with the [facility], please go directly to the [facility] and check in at the registration area.   Wear comfortable clothing and clothing appropriate for easy access to any Portacath or PICC line.   We strive to give you quality time with your provider. You may need to reschedule your appointment if you arrive late (15 or more minutes).  Arriving late affects you and other patients whose appointments are after yours.  Also, if you miss three or more appointments without notifying the office, you may be dismissed from the clinic at the provider's discretion.      For prescription refill requests, have your pharmacy contact our office and allow 72 hours for refills to be completed.    Today you received the following chemotherapy and/or immunotherapy agents taxol herceptin  To help prevent nausea and vomiting after your treatment, we encourage you to take your nausea medication as directed.  BELOW ARE SYMPTOMS THAT SHOULD BE REPORTED IMMEDIATELY: *FEVER GREATER THAN 100.4 F (38 C) OR HIGHER *CHILLS OR SWEATING *NAUSEA AND VOMITING THAT IS NOT CONTROLLED WITH YOUR NAUSEA MEDICATION *UNUSUAL SHORTNESS OF BREATH *UNUSUAL BRUISING OR BLEEDING *URINARY PROBLEMS (pain or burning when urinating, or frequent urination) *BOWEL PROBLEMS (unusual diarrhea, constipation, pain near the anus) TENDERNESS IN MOUTH AND THROAT WITH OR WITHOUT PRESENCE OF ULCERS (sore throat, sores in mouth, or a toothache) UNUSUAL RASH, SWELLING OR PAIN  UNUSUAL VAGINAL DISCHARGE OR ITCHING   Items with * indicate a potential emergency and should be followed up as soon as possible or go to the Emergency Department if any problems should occur.  Please show the CHEMOTHERAPY ALERT CARD or IMMUNOTHERAPY ALERT CARD at check-in to  the Emergency Department and triage nurse.  Should you have questions after your visit or need to cancel or reschedule your appointment, please contact [facility]  Dept: [phone]  and follow the prompts.  Office hours are 8:00 a.m. to 4:30 p.m. Monday - Friday. Please note that voicemails left after 4:00 p.m. may not be returned until the following business day.  We are closed weekends and major holidays. You have access to a nurse at all times for urgent questions. Please call the main number to the clinic Dept: [phone] and follow the prompts.   For any non-urgent questions, you may also contact your provider using MyChart. We now offer e-Visits for anyone 53 and older to request care online for non-urgent symptoms. For details visit [email or web address].   Also download the MyChart app! Go to the app store, search "MyChart", open the app, select [facility], and log in with your MyChart username and password.  Due to Covid, a mask is required upon entering the hospital/clinic. If you do not have a mask, one will be given to you upon arrival. For doctor visits, patients may have 1 support person aged 75 or older with them. For treatment visits, patients cannot have anyone with them due to current Covid guidelines and our immunocompromised population.

## 2021-07-27 MED FILL — Dexamethasone Sodium Phosphate Inj 100 MG/10ML: INTRAMUSCULAR | Qty: 1 | Status: AC

## 2021-07-27 NOTE — Progress Notes (Signed)
Patient Care Team: Lori Coombe, DO as PCP - General (Family Medicine) Lori Peak, MD as Consulting Physician (Radiation Oncology) Barbaraann Cao Georgeanna Lea, MD as Consulting Physician (Oncology) [facility] ([facility]hthalmology) Domenic Schwab, Lucianne Lei, MD as Referring Physician (Dermatology) Graylin Shiver, MD as Referring Physician (Otolaryngology) Pershing Proud, RN as Oncology Nurse Navigator Donnelly Angelica, RN as Oncology Nurse Navigator  DIAGNOSIS:    ICD-10-CM   1. Malignant neoplasm of upper-outer quadrant of right female breast, unspecified estrogen receptor status ([facility])  C50.411       SUMMARY OF ONCOLOGIC HISTORY: Oncology History  Cancer of upper-outer quadrant of female breast ([facility])  2013 Initial Diagnosis   Right breast cancer: Stage Ia ER/PR positive HER2 negative status postlumpectomy, radiation (low risk Oncotype) could not tolerate tamoxifen for more than 30 days.   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  Biopsy revealed grade 3 IDC ER/PR negative HER2 positive with a Ki-67 of 30%   05/26/2021 Cancer Staging   Staging form: Breast, AJCC 7th Edition - Clinical stage from 05/26/2021: Stage Unknown (T1c, NX, cM0) - Signed by Lori Peak, MD on 05/28/2021 Specimen type: Core Needle Biopsy Stage prefix: Initial diagnosis Laterality: Right Tumor grade (Scarff-Bloom-Richardson system): G3 Estrogen receptor status: Negative Progesterone receptor status: Negative HER2 status: Positive Staging comments: Staged at Breast Cancer Conference 12/08/11      06/18/2021 Surgery   Right lumpectomy: Grade 3 IDC 1.5 cm, high-grade DCIS, margins negative, 0/2 lymph nodes negative, ER 0%, PR 0%, HER2 3+ positive, Ki-67 30%    Genetic Testing    Negative genetic testing. No pathogenic variants identified on the [employer] Multi-Cancer Panel+RNA. VUS in POLD1 identified called c.2429C>T. The report date is 07/01/2021.  The Multi-Cancer Panel + RNA offered by [facility] includes sequencing and/or deletion duplication testing of the following 84 genes: AIP, ALK, APC, ATM, AXIN2,BAP1,  BARD1, BLM, BMPR1A, BRCA1, BRCA2, BRIP1, CASR, CDC73, CDH1, CDK4, CDKN1B, CDKN1C, CDKN2A (p14ARF), CDKN2A (p16INK4a), CEBPA, CHEK2, CTNNA1, DICER1, DIS3L2, EGFR (c.2369C>T, p.Thr790Met variant only), EPCAM (Deletion/duplication testing only), FH, FLCN, GATA2, GPC3, GREM1 (Promoter region deletion/duplication testing only), HOXB13 (c.251G>A, p.Gly84Glu), HRAS, KIT, MAX, MEN1, MET, MITF (c.952G>A, p.Glu318Lys variant only), MLH1, MSH2, MSH3, MSH6, MUTYH, NBN, NF1, NF2, NTHL1, PALB2, PDGFRA, PHOX2B, PMS2, POLD1, POLE, POT1, PRKAR1A, PTCH1, PTEN, RAD50, RAD51C, RAD51D, RB1, RECQL4, RET, RUNX1, SDHAF2, SDHA (sequence changes only), SDHB, SDHC, SDHD, SMAD4, SMARCA4, SMARCB1, SMARCE1, STK11, SUFU, TERC, TERT, TMEM127, TP53, TSC1, TSC2, VHL, WRN and WT1.   07/21/2021 -  Chemotherapy   Patient is on Treatment Plan : BREAST Paclitaxel + Trastuzumab q7d / Trastuzumab q21d       CHIEF COMPLIANT: Taxol Herceptin cycle 2  INTERVAL HISTORY: IMANI FIEBELKORN is a 60 y.o. with above-mentioned history of right breast cancer having undergone lumpectomy, currently on chemotherapy with Herceptin. She presents to the clinic today for cycle 2 Taxol.  She tolerated cycle 1 of Taxol Herceptin extremely well.  Did not have any diarrhea.  She did have mild nausea for which she took Compazine.  Mild fatigue as well.  ALLERGIES:  is allergic to lidocaine.  MEDICATIONS:  Current Outpatient Medications  Medication Sig Dispense Refill   ALPRAZolam (XANAX) 0.25 MG tablet Take 1 tablet (0.25 mg total) by mouth 2 (two)  times daily as needed for anxiety. 30 tablet 1   diazepam (VALIUM) 2 MG tablet Take  by mouth.     gabapentin (NEURONTIN) 300 MG capsule Take 2 capsules (600 mg total) by mouth 3 (three) times daily. 90 capsule 0   ibuprofen (ADVIL) 800 MG tablet Take 1 tablet (800 mg total) by mouth every 8 (eight) hours as needed. 30 tablet 0   lamoTRIgine (LAMICTAL) 25 MG tablet Take 2 tablets (50 mg total) by mouth daily. 60 tablet 3   lidocaine-prilocaine (EMLA) cream Apply to affected area once 30 g 3   ondansetron (ZOFRAN) 8 MG tablet Take 1 tablet (8 mg total) by mouth 2 (two) times daily as needed (Nausea or vomiting). 30 tablet 1   oxyCODONE (OXY IR/ROXICODONE) 5 MG immediate release tablet Take 1 tablet (5 mg total) by mouth every 6 (six) hours as needed for severe pain. 15 tablet 0   prochlorperazine (COMPAZINE) 10 MG tablet Take 1 tablet (10 mg total) by mouth every 6 (six) hours as needed (Nausea or vomiting). 30 tablet 1   valACYclovir (VALTREX) 1000 MG tablet Take 1 tablet (1,000 mg total) by mouth 3 (three) times daily. 21 tablet 0   No current facility-administered medications for this visit.    PHYSICAL EXAMINATION: ECOG PERFORMANCE STATUS: 1 - Symptomatic but completely ambulatory  There were no vitals filed for this visit. There were no vitals filed for this visit.  LABORATORY DATA:  I have reviewed the data as listed CMP Latest Ref Rng & Units 07/21/2021 06/17/2021 01/18/2021  Glucose 70 - 99 mg/dL 94 86 99  BUN 6 - 20 mg/dL $Remove'13 8 15  'sTnTihQ$ Creatinine 0.44 - 1.00 mg/dL 0.66 0.61 0.57  Sodium 135 - 145 mmol/L 139 136 137  Potassium 3.5 - 5.1 mmol/L 3.8 4.4 3.9  Chloride 98 - 111 mmol/L 106 105 106  CO2 22 - 32 mmol/L $RemoveB'23 23 23  'zDCvXkNy$ Calcium 8.9 - 10.3 mg/dL 9.1 9.1 8.9  Total Protein 6.5 - 8.1 g/dL 6.9 6.9 7.1  Total Bilirubin 0.3 - 1.2 mg/dL 0.4 0.6 0.7  Alkaline Phos 38 - 126 U/L 95 78 76  AST 15 - 41 U/L 14(L) 21 18  ALT 0 - 44 U/L $Remo'16 15 18    'gjoat$ Lab Results  Component Value Date   WBC 7.6 07/21/2021   HGB 11.9 (L) 07/21/2021   HCT 35.8 (L) 07/21/2021   MCV 90.2  07/21/2021   PLT 292 07/21/2021   NEUTROABS 4.7 07/21/2021    ASSESSMENT & PLAN:  Cancer of upper-outer quadrant of female breast ([place]) 06/18/2021:Right lumpectomy: 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  Adjuvant chemotherapy with Taxol and Herceptin followed by Herceptin maintenance 2. discussion regarding pros and cons of reirradiation (Dr. Isidore Moos will need to inform us if she is eligible for radiation given her prior history of radiation therapy to the breast) ----------------------------------------------------------------------------------------------------------------------------------- Current treatment: Cycle 2 Taxol Herceptin Echocardiogram: EF 60 to 65%   Chemo toxicities: Mild nausea and mild fatigue otherwise tolerated the treatment extremely well. Did not have any infusion reactions.  Return to clinic in 1 week for cycle 2 and toxicity check    No orders of the defined types were placed in  this encounter.  The patient has a good understanding of the overall plan. she agrees with it. she will call with any problems that may develop before the next visit here.  Total time spent: 30 mins including face to face time and time spent for planning, charting and coordination of care  Rulon Eisenmenger, MD, MPH 07/28/2021  I, Thana Ates, am acting as scribe for Dr. Nicholas Lose.  I have reviewed the above documentation for accuracy and completeness, and I agree with the above.

## 2021-07-28 ENCOUNTER — Inpatient Hospital Stay (HOSPITAL_BASED_OUTPATIENT_CLINIC_OR_DEPARTMENT_OTHER): Payer: Medicare Other | Admitting: Hematology and Oncology

## 2021-07-28 ENCOUNTER — Inpatient Hospital Stay: Payer: Medicare Other

## 2021-07-28 ENCOUNTER — Other Ambulatory Visit: Payer: Self-pay

## 2021-07-28 DIAGNOSIS — Z5111 Encounter for antineoplastic chemotherapy: Secondary | ICD-10-CM | POA: Diagnosis not present

## 2021-07-28 DIAGNOSIS — Z95828 Presence of other vascular implants and grafts: Secondary | ICD-10-CM

## 2021-07-28 DIAGNOSIS — C50411 Malignant neoplasm of upper-outer quadrant of right female breast: Secondary | ICD-10-CM

## 2021-07-28 LAB — CMP (CANCER CENTER ONLY)
ALT: 18 U/L (ref 0–44)
AST: 17 U/L (ref 15–41)
Albumin: 3.8 g/dL (ref 3.5–5.0)
Alkaline Phosphatase: 85 U/L (ref 38–126)
Anion gap: 8 (ref 5–15)
BUN: 11 mg/dL (ref 6–20)
CO2: 23 mmol/L (ref 22–32)
Calcium: 8.9 mg/dL (ref 8.9–10.3)
Chloride: 108 mmol/L (ref 98–111)
Creatinine: 0.76 mg/dL (ref 0.44–1.00)
GFR, Estimated: >60 mL/min (ref >60)
Glucose, Bld: 90 mg/dL (ref 70–99)
Potassium: 3.8 mmol/L (ref 3.5–5.1)
Sodium: 139 mmol/L (ref 135–145)
Total Bilirubin: 0.3 mg/dL (ref 0.3–1.2)
Total Protein: 6.9 g/dL (ref 6.5–8.1)

## 2021-07-28 LAB — CBC WITH DIFFERENTIAL (CANCER CENTER ONLY)
Abs Immature Granulocytes: 0.05 10*3/uL (ref 0.00–0.07)
Basophils Absolute: 0.1 10*3/uL (ref 0.0–0.1)
Basophils Relative: 1 %
Eosinophils Absolute: 0.2 10*3/uL (ref 0.0–0.5)
Eosinophils Relative: 2 %
HCT: 35.8 % — ABNORMAL LOW (ref 36.0–46.0)
Hemoglobin: 11.4 g/dL — ABNORMAL LOW (ref 12.0–15.0)
Immature Granulocytes: 1 %
Lymphocytes Relative: 25 %
Lymphs Abs: 2 10*3/uL (ref 0.7–4.0)
MCH: 28.9 pg (ref 26.0–34.0)
MCHC: 31.8 g/dL (ref 30.0–36.0)
MCV: 90.9 fL (ref 80.0–100.0)
Monocytes Absolute: 0.6 10*3/uL (ref 0.1–1.0)
Monocytes Relative: 7 %
Neutro Abs: 5.2 10*3/uL (ref 1.7–7.7)
Neutrophils Relative %: 64 %
Platelet Count: 282 10*3/uL (ref 150–400)
RBC: 3.94 MIL/uL (ref 3.87–5.11)
RDW: 13.3 % (ref 11.5–15.5)
WBC Count: 8.1 10*3/uL (ref 4.0–10.5)
nRBC: 0 % (ref 0.0–0.2)

## 2021-07-28 MED ORDER — SODIUM CHLORIDE 0.9 % IV SOLN: Freq: Once | INTRAVENOUS | Status: AC

## 2021-07-28 MED ORDER — SODIUM CHLORIDE 0.9 % IV SOLN
10.0000 mg | Freq: Once | INTRAVENOUS | Status: AC
  Administered 2021-07-28: 10 mg via INTRAVENOUS
  Filled 2021-07-28: qty 10

## 2021-07-28 MED ORDER — FAMOTIDINE 20 MG IN NS 100 ML IVPB
20.0000 mg | Freq: Once | INTRAVENOUS | Status: AC
  Administered 2021-07-28: 20 mg via INTRAVENOUS
  Filled 2021-07-28: qty 100

## 2021-07-28 MED ORDER — HEPARIN SOD (PORK) LOCK FLUSH 100 UNIT/ML IV SOLN
500.0000 [IU] | Freq: Once | INTRAVENOUS | Status: AC | PRN
  Administered 2021-07-28: 500 [IU]

## 2021-07-28 MED ORDER — DIPHENHYDRAMINE HCL 50 MG/ML IJ SOLN
50.0000 mg | Freq: Once | INTRAMUSCULAR | Status: AC
Start: 2021-07-28 — End: 2021-07-28
  Administered 2021-07-28: 50 mg via INTRAVENOUS
  Filled 2021-07-28: qty 1

## 2021-07-28 MED ORDER — SODIUM CHLORIDE 0.9% FLUSH
10.0000 mL | INTRAVENOUS | Status: DC | PRN
  Administered 2021-07-28: 10 mL

## 2021-07-28 MED ORDER — TRASTUZUMAB-ANNS CHEMO 150 MG IV SOLR
150.0000 mg | Freq: Once | INTRAVENOUS | Status: AC
  Administered 2021-07-28: 150 mg via INTRAVENOUS
  Filled 2021-07-28: qty 7.14

## 2021-07-28 MED ORDER — SODIUM CHLORIDE 0.9% FLUSH
10.0000 mL | Freq: Once | INTRAVENOUS | Status: AC
  Administered 2021-07-28: 10 mL

## 2021-07-28 MED ORDER — SODIUM CHLORIDE 0.9 % IV SOLN
65.0000 mg/m2 | Freq: Once | INTRAVENOUS | Status: AC
  Administered 2021-07-28: 114 mg via INTRAVENOUS
  Filled 2021-07-28: qty 19

## 2021-07-28 MED ORDER — ACETAMINOPHEN 325 MG PO TABS
650.0000 mg | ORAL_TABLET | Freq: Once | ORAL | Status: AC
  Administered 2021-07-28: 650 mg via ORAL
  Filled 2021-07-28: qty 2

## 2021-07-28 NOTE — Progress Notes (Signed)
Pt tolerated treatment well. VSS and labs WDL. All questions answered.

## 2021-07-28 NOTE — Patient Instructions (Signed)
[facility]COL[facility]  Discharge Instructions: Thank you for choosing [facility] to provide your oncology and hematology care.   If you have a lab appointment with the [facility], please go directly to the [facility] and check in at the registration area.   Wear comfortable clothing and clothing appropriate for easy access to any Portacath or PICC line.   We strive to give you quality time with your provider. You may need to reschedule your appointment if you arrive late (15 or more minutes).  Arriving late affects you and other patients whose appointments are after yours.  Also, if you miss three or more appointments without notifying the office, you may be dismissed from the clinic at the provider's discretion.      For prescription refill requests, have your pharmacy contact our office and allow 72 hours for refills to be completed.    Today you received the following chemotherapy and/or immunotherapy agents Trastuzumab-anns and Paclitaxol      To help prevent nausea and vomiting after your treatment, we encourage you to take your nausea medication as directed.  BELOW ARE SYMPTOMS THAT SHOULD BE REPORTED IMMEDIATELY: *FEVER GREATER THAN 100.4 F (38 C) OR HIGHER *CHILLS OR SWEATING *NAUSEA AND VOMITING THAT IS NOT CONTROLLED WITH YOUR NAUSEA MEDICATION *UNUSUAL SHORTNESS OF BREATH *UNUSUAL BRUISING OR BLEEDING *URINARY PROBLEMS (pain or burning when urinating, or frequent urination) *BOWEL PROBLEMS (unusual diarrhea, constipation, pain near the anus) TENDERNESS IN MOUTH AND THROAT WITH OR WITHOUT PRESENCE OF ULCERS (sore throat, sores in mouth, or a toothache) UNUSUAL RASH, SWELLING OR PAIN  UNUSUAL VAGINAL DISCHARGE OR ITCHING   Items with * indicate a potential emergency and should be followed up as soon as possible or go to the Emergency Department if any problems should occur.  Please show the CHEMOTHERAPY ALERT CARD or IMMUNOTHERAPY ALERT  CARD at check-in to the Emergency Department and triage nurse.  Should you have questions after your visit or need to cancel or reschedule your appointment, please contact [facility]  Dept: [phone]  and follow the prompts.  Office hours are 8:00 a.m. to 4:30 p.m. Monday - Friday. Please note that voicemails left after 4:00 p.m. may not be returned until the following business day.  We are closed weekends and major holidays. You have access to a nurse at all times for urgent questions. Please call the main number to the clinic Dept: [phone] and follow the prompts.   For any non-urgent questions, you may also contact your provider using MyChart. We now offer e-Visits for anyone 56 and older to request care online for non-urgent symptoms. For details visit [email or web address].   Also download the MyChart app! Go to the app store, search "MyChart", open the app, select [facility], and log in with your MyChart username and password.  Due to Covid, a mask is required upon entering the hospital/clinic. If you do not have a mask, one will be given to you upon arrival. For doctor visits, patients may have 1 support person aged 19 or older with them. For treatment visits, patients cannot have anyone with them due to current Covid guidelines and our immunocompromised population.

## 2021-07-28 NOTE — Assessment & Plan Note (Signed)
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 Isidore Moos will need to inform us if she is eligible for radiation given her prior history of radiation therapy to the breast) ----------------------------------------------------------------------------------------------------------------------------------- Current treatment: Cycle 2 Taxol Herceptin Echocardiogram: EF 60 to 65%  Chemo toxicities:  Return to clinic in 1 week for cycle 2 and toxicity check

## 2021-07-30 ENCOUNTER — Encounter: Payer: Self-pay | Admitting: Hematology and Oncology

## 2021-07-31 ENCOUNTER — Other Ambulatory Visit: Payer: Self-pay | Admitting: *Deleted

## 2021-07-31 MED ORDER — LEVOFLOXACIN 500 MG PO TABS: 500.0000 mg | ORAL_TABLET | Freq: Every day | ORAL | 0 refills | Status: DC

## 2021-07-31 NOTE — Progress Notes (Signed)
Received all from pt with complaint of severe sinus pressure and inner right ear pain.  Pt denies fever at this time.  Per MD pt to start Levaquin 500 mg po daily x7 days and to Covid test.  Pt educated to call the office if Covid test is positive.  Pt verbalized understanding and prescription sent to pharmacy on file.

## 2021-08-03 MED FILL — Dexamethasone Sodium Phosphate Inj 100 MG/10ML: INTRAMUSCULAR | Qty: 1 | Status: AC

## 2021-08-04 ENCOUNTER — Other Ambulatory Visit: Payer: Self-pay

## 2021-08-04 ENCOUNTER — Inpatient Hospital Stay: Payer: Medicare Other

## 2021-08-04 VITALS — BP 130/78 | HR 72 | Temp 97.8°F | Resp 16

## 2021-08-04 DIAGNOSIS — Z95828 Presence of other vascular implants and grafts: Secondary | ICD-10-CM

## 2021-08-04 DIAGNOSIS — C50411 Malignant neoplasm of upper-outer quadrant of right female breast: Secondary | ICD-10-CM

## 2021-08-04 DIAGNOSIS — Z5111 Encounter for antineoplastic chemotherapy: Secondary | ICD-10-CM | POA: Diagnosis not present

## 2021-08-04 LAB — CBC WITH DIFFERENTIAL (CANCER CENTER ONLY)
Abs Immature Granulocytes: 0.02 10*3/uL (ref 0.00–0.07)
Basophils Absolute: 0 10*3/uL (ref 0.0–0.1)
Basophils Relative: 1 %
Eosinophils Absolute: 0.1 10*3/uL (ref 0.0–0.5)
Eosinophils Relative: 2 %
HCT: 35.2 % — ABNORMAL LOW (ref 36.0–46.0)
Hemoglobin: 11.3 g/dL — ABNORMAL LOW (ref 12.0–15.0)
Immature Granulocytes: 0 %
Lymphocytes Relative: 25 %
Lymphs Abs: 1.6 10*3/uL (ref 0.7–4.0)
MCH: 29.4 pg (ref 26.0–34.0)
MCHC: 32.1 g/dL (ref 30.0–36.0)
MCV: 91.7 fL (ref 80.0–100.0)
Monocytes Absolute: 0.5 10*3/uL (ref 0.1–1.0)
Monocytes Relative: 7 %
Neutro Abs: 4.2 10*3/uL (ref 1.7–7.7)
Neutrophils Relative %: 65 %
Platelet Count: 321 10*3/uL (ref 150–400)
RBC: 3.84 MIL/uL — ABNORMAL LOW (ref 3.87–5.11)
RDW: 13.6 % (ref 11.5–15.5)
WBC Count: 6.5 10*3/uL (ref 4.0–10.5)
nRBC: 0 % (ref 0.0–0.2)

## 2021-08-04 LAB — CMP (CANCER CENTER ONLY)
ALT: 22 U/L (ref 0–44)
AST: 17 U/L (ref 15–41)
Albumin: 3.7 g/dL (ref 3.5–5.0)
Alkaline Phosphatase: 71 U/L (ref 38–126)
Anion gap: 8 (ref 5–15)
BUN: 14 mg/dL (ref 6–20)
CO2: 22 mmol/L (ref 22–32)
Calcium: 8.9 mg/dL (ref 8.9–10.3)
Chloride: 109 mmol/L (ref 98–111)
Creatinine: 0.71 mg/dL (ref 0.44–1.00)
GFR, Estimated: >60 mL/min (ref >60)
Glucose, Bld: 95 mg/dL (ref 70–99)
Potassium: 3.9 mmol/L (ref 3.5–5.1)
Sodium: 139 mmol/L (ref 135–145)
Total Bilirubin: 0.2 mg/dL — ABNORMAL LOW (ref 0.3–1.2)
Total Protein: 6.8 g/dL (ref 6.5–8.1)

## 2021-08-04 MED ORDER — FAMOTIDINE 20 MG IN NS 100 ML IVPB
20.0000 mg | Freq: Once | INTRAVENOUS | Status: AC
  Administered 2021-08-04: 20 mg via INTRAVENOUS
  Filled 2021-08-04: qty 100

## 2021-08-04 MED ORDER — HEPARIN SOD (PORK) LOCK FLUSH 100 UNIT/ML IV SOLN
500.0000 [IU] | Freq: Once | INTRAVENOUS | Status: AC | PRN
  Administered 2021-08-04: 500 [IU]

## 2021-08-04 MED ORDER — ACETAMINOPHEN 325 MG PO TABS
650.0000 mg | ORAL_TABLET | Freq: Once | ORAL | Status: AC
  Administered 2021-08-04: 650 mg via ORAL
  Filled 2021-08-04: qty 2

## 2021-08-04 MED ORDER — SODIUM CHLORIDE 0.9 % IV SOLN: Freq: Once | INTRAVENOUS | Status: AC

## 2021-08-04 MED ORDER — TRASTUZUMAB-ANNS CHEMO 150 MG IV SOLR
150.0000 mg | Freq: Once | INTRAVENOUS | Status: AC
  Administered 2021-08-04: 150 mg via INTRAVENOUS
  Filled 2021-08-04: qty 7.14

## 2021-08-04 MED ORDER — DIPHENHYDRAMINE HCL 50 MG/ML IJ SOLN
50.0000 mg | Freq: Once | INTRAMUSCULAR | Status: AC
Start: 2021-08-04 — End: 2021-08-04
  Administered 2021-08-04: 50 mg via INTRAVENOUS
  Filled 2021-08-04: qty 1

## 2021-08-04 MED ORDER — SODIUM CHLORIDE 0.9% FLUSH
10.0000 mL | Freq: Once | INTRAVENOUS | Status: AC
Start: 2021-08-04 — End: 2021-08-04
  Administered 2021-08-04: 10 mL

## 2021-08-04 MED ORDER — SODIUM CHLORIDE 0.9 % IV SOLN
65.0000 mg/m2 | Freq: Once | INTRAVENOUS | Status: AC
  Administered 2021-08-04: 114 mg via INTRAVENOUS
  Filled 2021-08-04: qty 19

## 2021-08-04 MED ORDER — SODIUM CHLORIDE 0.9% FLUSH
10.0000 mL | INTRAVENOUS | Status: DC | PRN
  Administered 2021-08-04: 10 mL

## 2021-08-04 MED ORDER — DEXAMETHASONE SODIUM PHOSPHATE 100 MG/10ML IJ SOLN
10.0000 mg | Freq: Once | INTRAMUSCULAR | Status: AC
  Administered 2021-08-04: 10 mg via INTRAVENOUS
  Filled 2021-08-04: qty 10

## 2021-08-04 NOTE — Patient Instructions (Signed)
[facility]   Discharge Instructions: Thank you for choosing [facility] to provide your oncology and hematology care.   If you have a lab appointment with the Cancer Center, please go directly to the Cancer Center and check in at the registration area.   Wear comfortable clothing and clothing appropriate for easy access to any Portacath or PICC line.   We strive to give you quality time with your provider. You may need to reschedule your appointment if you arrive late (15 or more minutes).  Arriving late affects you and other patients whose appointments are after yours.  Also, if you miss three or more appointments without notifying the office, you may be dismissed from the clinic at the provider's discretion.      For prescription refill requests, have your pharmacy contact our office and allow 72 hours for refills to be completed.    Today you received the following chemotherapy and/or immunotherapy agents: paclitaxel and carboplatin.      To help prevent nausea and vomiting after your treatment, we encourage you to take your nausea medication as directed.  BELOW ARE SYMPTOMS THAT SHOULD BE REPORTED IMMEDIATELY: *FEVER GREATER THAN 100.4 F (38 C) OR HIGHER *CHILLS OR SWEATING *NAUSEA AND VOMITING THAT IS NOT CONTROLLED WITH YOUR NAUSEA MEDICATION *UNUSUAL SHORTNESS OF BREATH *UNUSUAL BRUISING OR BLEEDING *URINARY PROBLEMS (pain or burning when urinating, or frequent urination) *BOWEL PROBLEMS (unusual diarrhea, constipation, pain near the anus) TENDERNESS IN MOUTH AND THROAT WITH OR WITHOUT PRESENCE OF ULCERS (sore throat, sores in mouth, or a toothache) UNUSUAL RASH, SWELLING OR PAIN  UNUSUAL VAGINAL DISCHARGE OR ITCHING   Items with * indicate a potential emergency and should be followed up as soon as possible or go to the Emergency Department if any problems should occur.  Please show the CHEMOTHERAPY ALERT CARD or IMMUNOTHERAPY ALERT  CARD at check-in to the Emergency Department and triage nurse.  Should you have questions after your visit or need to cancel or reschedule your appointment, please contact [facility]  Dept: [phone]  and follow the prompts.  Office hours are 8:00 a.m. to 4:30 p.m. Monday - Friday. Please note that voicemails left after 4:00 p.m. may not be returned until the following business day.  We are closed weekends and major holidays. You have access to a nurse at all times for urgent questions. Please call the main number to the clinic Dept: [phone] and follow the prompts.   For any non-urgent questions, you may also contact your provider using MyChart. We now offer e-Visits for anyone 18 and older to request care online for non-urgent symptoms. For details visit [email or web address].   Also download the MyChart app! Go to the app store, search "MyChart", open the app, select [facility], and log in with your MyChart username and password.  Due to Covid, a mask is required upon entering the hospital/clinic. If you do not have a mask, one will be given to you upon arrival. For doctor visits, patients may have 1 support person aged 18 or older with them. For treatment visits, patients cannot have anyone with them due to current Covid guidelines and our immunocompromised population.   

## 2021-08-07 ENCOUNTER — Encounter: Payer: Self-pay | Admitting: Family Medicine

## 2021-08-07 ENCOUNTER — Ambulatory Visit (INDEPENDENT_AMBULATORY_CARE_PROVIDER_SITE_OTHER): Payer: Medicare Other | Admitting: Family Medicine

## 2021-08-07 ENCOUNTER — Other Ambulatory Visit: Payer: Self-pay

## 2021-08-07 DIAGNOSIS — H6981 Other specified disorders of Eustachian tube, right ear: Secondary | ICD-10-CM

## 2021-08-07 MED ORDER — PREDNISONE 20 MG PO TABS: 20.0000 mg | ORAL_TABLET | Freq: Two times a day (BID) | ORAL | 0 refills | Status: DC

## 2021-08-07 NOTE — Patient Instructions (Signed)
Eustachian Tube Dysfunction °Eustachian tube dysfunction refers to a condition in which a blockage develops in the narrow passage that connects the middle ear to the back of the nose (eustachian tube). The eustachian tube regulates air pressure in the middle ear by letting air move between the ear and nose. It also helps to drain fluid from the middle ear space. °Eustachian tube dysfunction can affect one or both ears. When the eustachian tube does not function properly, air pressure, fluid, or both can build up in the middle ear. °What are the causes? °This condition occurs when the eustachian tube becomes blocked or cannot open normally. Common causes of this condition include: °Ear infections. °Colds and other infections that affect the nose, mouth, and throat (upper respiratory tract). °Allergies. °Irritation from cigarette smoke. °Irritation from stomach acid coming up into the esophagus (gastroesophageal reflux). The esophagus is the part of the body that moves food from the mouth to the stomach. °Sudden changes in air pressure, such as from descending in an airplane or scuba diving. °Abnormal growths in the nose or throat, such as: °Growths that line the nose (nasal polyps). °Abnormal growth of cells (tumors). °Enlarged tissue at the back of the throat (adenoids). °What increases the risk? °You are more likely to develop this condition if: °You smoke. °You are overweight. °You are a child who has: °Certain birth defects of the mouth, such as cleft palate. °Large tonsils or adenoids. °What are the signs or symptoms? °Common symptoms of this condition include: °A feeling of fullness in the ear. °Ear pain. °Clicking or popping noises in the ear. °Ringing in the ear (tinnitus). °Hearing loss. °Loss of balance. °Dizziness. °Symptoms may get worse when the air pressure around you changes, such as when you travel to an area of high elevation, fly on an airplane, or go scuba diving. °How is this diagnosed? °This  condition may be diagnosed based on: °Your symptoms. °A physical exam of your ears, nose, and throat. °Tests, such as those that measure: °The movement of your eardrum. °Your hearing (audiometry). °How is this treated? °Treatment depends on the cause and severity of your condition. °In mild cases, you may relieve your symptoms by moving air into your ears. This is called "popping the ears." °In more severe cases, or if you have symptoms of fluid in your ears, treatment may include: °Medicines to relieve congestion (decongestants). °Medicines that treat allergies (antihistamines). °Nasal sprays or ear drops that contain medicines that reduce swelling (steroids). °A procedure to drain the fluid in your eardrum. In this procedure, a small tube may be placed in the eardrum to: °Drain the fluid. °Restore the air in the middle ear space. °A procedure to insert a balloon device through the nose to inflate the opening of the eustachian tube (balloon dilation). °Follow these instructions at home: °Lifestyle °Do not do any of the following until your health care provider approves: °Travel to high altitudes. °Fly in airplanes. °Work in a pressurized cabin or room. °Scuba dive. °Do not use any products that contain nicotine or tobacco. These products include cigarettes, chewing tobacco, and vaping devices, such as e-cigarettes. If you need help quitting, ask your health care provider. °Keep your ears dry. Wear fitted earplugs during showering and bathing. Dry your ears completely after. °General instructions °Take over-the-counter and prescription medicines only as told by your health care provider. °Use techniques to help pop your ears as recommended by your health care provider. These may include: °Chewing gum. °Yawning. °Frequent, forceful swallowing. °Closing   your mouth, holding your nose closed, and gently blowing as if you are trying to blow air out of your nose. °Keep all follow-up visits. This is important. °Contact a  health care provider if: °Your symptoms do not go away after treatment. °Your symptoms come back after treatment. °You are unable to pop your ears. °You have: °A fever. °Pain in your ear. °Pain in your head or neck. °Fluid draining from your ear. °Your hearing suddenly changes. °You become very dizzy. °You lose your balance. °Get help right away if: °You have a sudden, severe increase in any of your symptoms. °Summary °Eustachian tube dysfunction refers to a condition in which a blockage develops in the eustachian tube. °It can be caused by ear infections, allergies, inhaled irritants, or abnormal growths in the nose or throat. °Symptoms may include ear pain or fullness, hearing loss, or ringing in the ears. °Mild cases are treated with techniques to unblock the ears, such as yawning or chewing gum. °More severe cases are treated with medicines or procedures. °This information is not intended to replace advice given to you by your health care provider. Make sure you discuss any questions you have with your health care provider. °Document Revised: 12/15/2020 Document Reviewed: 12/15/2020 °Elsevier Patient Education © 2022 [employer]. ° °

## 2021-08-09 DIAGNOSIS — H6981 Other specified disorders of Eustachian tube, right ear: Secondary | ICD-10-CM | POA: Insufficient documentation

## 2021-08-09 NOTE — Progress Notes (Signed)
Lori Mcmillan - 60 y.o. female MRN [id number]  Date of birth: [birth year]  Subjective Chief Complaint  Patient presents with   Headache    HPI Lori Mcmillan is a 60 year old female here today with complaint of right-sided ear and head pressure.  Symptoms started a few days ago.  She has had this before and typically resolves with combination of Flonase, meclizine and ibuprofen.  This is the longest episode she has had.  She does feel little bit of dizziness.  She does have history of meningioma resection along the right temporal area.  She has not had any fever, chills, sinus pain, nausea.  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  No sign of residual dz at 04/2018 rad onc f/u and 10/2018 neuro-onc f/u. 03/2019 MRI brain->no resid/no recurrence.   Brain embolism and thrombosis    Breast cancer ([place])    Rt breast; 1.5 cm low grade invasive ductal carcinoma status post lumpectomy with sentinel node biopsy on 01/04/2012.   Chicken pox    Depression    zoloft in past--?wt gain.   Diplopia    chronic (meningioma-related)   Eustachian tube dysfunction 09/05/2013   Family history of breast cancer    Family history of colon cancer    Family history of melanoma    Family history of ovarian cancer    Family history of pancreatic cancer    History of radiation therapy 02/24/12-04/11/12   right breast/ 45Gy@1 .8Gyx10fx/boost=16Gy@2  [id number].  Latest mammo and u/s 03/2013--normal.   History of radiation therapy 11/30/17- 01/11/18   Right temporal lobe treated to 55.8 Gy with 31 fx of 1.8 Gy   Hx of basal cell carcinoma    Migraine    Palpitations    has taken Metoprolol for  palpitations in the past.   Seizure ([place])    Taste impairment    s/p radiation therapy   Tobacco dependence 09/05/2013    Past Surgical History:  Procedure Laterality Date   APPENDECTOMY  2008   emergency   BRAIN TUMOR EXCISION  2018   Meningioma   BREAST LUMPECTOMY  01/04/12   right breast   BREAST LUMPECTOMY WITH RADIOACTIVE SEED AND SENTINEL LYMPH NODE BIOPSY Right 06/18/2021   Procedure: RIGHT BREAST LUMPECTOMY WITH RADIOACTIVE SEED AND SENTINEL LYMPH NODE BIOPSY;  Surgeon: Erroll Luna, MD;  Location: [facility];  Service: General;  Laterality: Right;   CRANIECTOMY  10/19/2017   at [facility] Right 06/18/2021   Procedure: INSERTION PORT-A-CATH;  Surgeon: Erroll Luna, MD;  Location: [facility];  Service: General;  Laterality: Right;   WISDOM TOOTH EXTRACTION  1990    Social History   Socioeconomic History   Marital status: Single    Spouse name: Not on file   Number of children: Not on file   Years of education: Not on file   Highest education level: Not on file  Occupational History   Not on file  Tobacco Use   Smoking status: Former    Packs/day: 0.50    Types: Cigarettes    Quit date: 10/17/2017  Years since quitting: 3.8   Smokeless tobacco: Never  Vaping Use   Vaping Use: Never used  Substance and Sexual Activity   Alcohol use: Yes    Alcohol/week: 3.0 standard drinks    Types: 3 Cans of beer per week    Comment: 3-4 beers/day   Drug use: Not Currently   Sexual activity: Not Currently    Partners: Male  Other Topics Concern   Not on file  Social History Narrative   Marital status/children/pets: Single.  No children.  Lives alone.  Has pets.Orig from [place] in [place].   Education/employment: Bachelor's degree, retired   Engineer, materials:      -smoke alarm in the home:Yes     - wears seatbelt: Yes               Social Determinants of Health   Financial Resource Strain: Not on file  Food  Insecurity: Not on file  Transportation Needs: Not on file  Physical Activity: Not on file  Stress: Not on file  Social Connections: Not on file    Family History  Problem Relation Age of Onset   Anesthesia problems Mother    Breast cancer Mother 4       lumpectomy, chemo, radiation   Ovarian cancer Maternal Aunt 73   Cancer Maternal Aunt        unknown type   Breast cancer Maternal Aunt 80   Breast cancer Maternal Aunt 40       bilateral   Pancreatic cancer Maternal Uncle        dx 75s   Stomach cancer Paternal Uncle    Cancer Paternal Uncle        unknown type   Lung cancer Paternal Uncle        hx smoking   Dementia Maternal Grandmother    Lung cancer Maternal Grandfather        hx smoking   Pancreatic cancer Paternal Grandmother 35   Melanoma Paternal Grandmother        dx >50, shin   Heart Problems Paternal Grandfather    Colon cancer Cousin 21       maternal first cousin   Breast cancer Cousin        dx 33s, paternal first cousin    Health Maintenance  Topic Date Due   Pneumococcal Vaccine 62-26 Years old (1 - PCV) Never done   HIV Screening  Never done   Zoster Vaccines- Shingrix (1 of 2) Never done   PAP SMEAR-Modifier  Never done   COLONOSCOPY (Pts 45-45yrs Insurance coverage will need to be confirmed)  09/07/2018   COVID-19 Vaccine (3 - Pfizer risk series) 02/25/2020   INFLUENZA VACCINE  05/18/2021   MAMMOGRAM  05/05/2023   TETANUS/TDAP  03/05/2029   Hepatitis C Screening  Completed   HPV VACCINES  Aged Out     ----------------------------------------------------------------------------------------------------------------------------------------------------------------------------------------------------------------- Physical Exam BP 125/76 (BP Location: Left Arm, Patient Position: Sitting, Cuff Size: Normal)   Pulse 82   Temp (!) 97.3 F (36.3 C)   Ht 5\' 3"  (1.6 m)   Wt 160 lb (72.6 kg)   LMP 05/11/2013   SpO2 99%   BMI 28.34 kg/m    Physical Exam Constitutional:      Appearance: She is well-developed.  HENT:     Head: Normocephalic and atraumatic.     Ears:     Comments: Serous effusion to right ear.  Left ear normal. Eyes:     General: No scleral icterus.  Cardiovascular:     Rate and Rhythm: Normal rate and regular rhythm.  Pulmonary:     Effort: Pulmonary effort is normal.     Breath sounds: Normal breath sounds.  Musculoskeletal:     Cervical back: Neck supple.  Skin:    General: Skin is warm and dry.  Neurological:     General: No focal deficit present.     Mental Status: She is alert.  Psychiatric:        Mood and Affect: Mood normal.        Behavior: Behavior normal.    ------------------------------------------------------------------------------------------------------------------------------------------------------------------------------------------------------------------- Assessment and Plan  Eustachian tube dysfunction, right No neurological deficits.  Right serous effusion noted.  Continue Flonase.  Adding prednisone 20 mg twice daily x5 days.  She can add Mucinex as well in   Meds ordered this encounter  Medications   predniSONE (DELTASONE) 20 MG tablet    Sig: Take 1 tablet (20 mg total) by mouth 2 (two) times daily with a meal for 5 days.    Dispense:  10 tablet    Refill:  0    No follow-ups on file.    This visit occurred during the SARS-CoV-2 public health emergency.  Safety protocols were in place, including screening questions prior to the visit, additional usage of staff PPE, and extensive cleaning of exam room while observing appropriate contact time as indicated for disinfecting solutions.  Left AV

## 2021-08-09 NOTE — Assessment & Plan Note (Signed)
No neurological deficits.  Right serous effusion noted.  Continue Flonase.  Adding prednisone 20 mg twice daily x5 days.  She can add Mucinex as well in

## 2021-08-10 MED FILL — Dexamethasone Sodium Phosphate Inj 100 MG/10ML: INTRAMUSCULAR | Qty: 1 | Status: AC

## 2021-08-10 NOTE — Progress Notes (Signed)
Patient Care Team: Luetta Nutting, DO as PCP - General (Family Medicine) Eppie Gibson, MD as Consulting Physician (Radiation Oncology) Mickeal Skinner Acey Lav, MD as Consulting Physician (Oncology) [facility] (Ophthalmology) Bridget Hartshorn, Noel Journey, MD as Referring Physician (Dermatology) Helayne Seminole, MD as Referring Physician (Otolaryngology) Mauro Kaufmann, RN as Oncology Nurse Navigator Rockwell Germany, RN as Oncology Nurse Navigator  DIAGNOSIS:    ICD-10-CM   1. Malignant neoplasm of upper-outer quadrant of right female breast, unspecified estrogen receptor status ([place])  C50.411       SUMMARY OF ONCOLOGIC HISTORY: Oncology History  Cancer of upper-outer quadrant of female breast ([place])  2013 Initial Diagnosis   Right breast cancer: Stage Ia ER/PR positive HER2 negative status postlumpectomy, radiation (low risk Oncotype) could not tolerate tamoxifen for more than 30 days.   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 No pathogenic variants identified on the [facility] Multi-Cancer Panel+RNA. VUS in POLD1 identified called c.2429C>T. The report date is 07/01/2021.  The Multi-Cancer Panel + RNA offered by [facility] includes sequencing and/or deletion duplication testing of the following 84 genes: AIP, ALK, APC, ATM, AXIN2,BAP1,  BARD1, BLM, BMPR1A, BRCA1, BRCA2, BRIP1, CASR, CDC73, CDH1, CDK4, CDKN1B, CDKN1C, CDKN2A (p14ARF), CDKN2A (p16INK4a), CEBPA, CHEK2, CTNNA1, DICER1, DIS3L2, EGFR (c.2369C>T, p.Thr790Met variant only), EPCAM (Deletion/duplication testing only), FH, FLCN, GATA2, GPC3, GREM1 (Promoter region deletion/duplication testing only), HOXB13 (c.251G>A, p.Gly84Glu), HRAS, KIT, MAX, MEN1, MET, MITF (c.952G>A, p.Glu318Lys variant only), MLH1, MSH2, MSH3, MSH6, MUTYH, NBN, NF1, NF2, NTHL1, PALB2, PDGFRA, PHOX2B, PMS2, POLD1, POLE, POT1, PRKAR1A, PTCH1, PTEN, RAD50, RAD51C, RAD51D, RB1, RECQL4, RET, RUNX1, SDHAF2, SDHA (sequence changes only), SDHB, SDHC, SDHD, SMAD4, SMARCA4, SMARCB1, SMARCE1, STK11, SUFU, TERC, TERT, TMEM127, TP53, TSC1, TSC2, VHL, WRN and WT1.   07/21/2021 -  Chemotherapy   Patient is on Treatment Plan : BREAST Paclitaxel + Trastuzumab q7d / Trastuzumab q21d       CHIEF COMPLIANT: Cycle 4 Taxol Herceptin  INTERVAL HISTORY: MARSHELL RIEGER is a 60 y.o. with above-mentioned history of right breast cancer having undergone lumpectomy, currently on chemotherapy with Herceptin. She presents to the clinic today for treatment.  Her major complaint today is pain in the right side of the head.  She was started on prednisone which completely resolved the pain but when she stopped the prednisone the pain had come back.  She had no nausea or vomiting.  This is Kuneff a fullness in the right side of the head.  She thinks it is related to her prior brain surgery.  Denies peripheral  neuropathy.  ALLERGIES:  is allergic to lidocaine.  MEDICATIONS:  Current Outpatient Medications   Medication Sig Dispense Refill   ALPRAZolam (XANAX) 0.25 MG tablet Take 1 tablet (0.25 mg total) by mouth 2 (two) times daily as needed for anxiety. 30 tablet 1   diazepam (VALIUM) 2 MG tablet Take by mouth.     lamoTRIgine (LAMICTAL) 25 MG tablet Take 2 tablets (50 mg total) by mouth daily. 60 tablet 3   lidocaine-prilocaine (EMLA) cream Apply to affected area once 30 g 3   ondansetron (ZOFRAN) 8 MG tablet Take 1 tablet (8 mg total) by mouth 2 (two) times daily as needed (Nausea or vomiting). 30 tablet 1   predniSONE (DELTASONE) 10 MG tablet Take 1 tablet (10 mg total) by mouth daily with breakfast. 60 tablet 0   prochlorperazine (COMPAZINE) 10 MG tablet Take 1 tablet (10 mg total) by mouth every 6 (six) hours as needed (Nausea or vomiting). 30 tablet 1   No current facility-administered medications for this visit.    PHYSICAL EXAMINATION: ECOG PERFORMANCE STATUS: 1 - Symptomatic but completely ambulatory  Vitals:   08/11/21 0946  BP: 136/68  Pulse: 73  Temp: (!) 97.3 F (36.3 C)  SpO2: 100%   Filed Weights   08/11/21 0946  Weight: 160 lb 8 oz (72.8 kg)    LABORATORY DATA:  I have reviewed the data as listed CMP Latest Ref Rng & Units 08/04/2021 07/28/2021 07/21/2021  Glucose 70 - 99 mg/dL 95 90 94  BUN 6 - 20 mg/dL $Remove'14 11 13  'IiPtBpC$ Creatinine 0.44 - 1.00 mg/dL 0.71 0.76 0.66  Sodium 135 - 145 mmol/L 139 139 139  Potassium 3.5 - 5.1 mmol/L 3.9 3.8 3.8  Chloride 98 - 111 mmol/L 109 108 106  CO2 22 - 32 mmol/L $RemoveB'22 23 23  'eUgQBlPz$ Calcium 8.9 - 10.3 mg/dL 8.9 8.9 9.1  Total Protein 6.5 - 8.1 g/dL 6.8 6.9 6.9  Total Bilirubin 0.3 - 1.2 mg/dL 0.2(L) 0.3 0.4  Alkaline Phos 38 - 126 U/L 71 85 95  AST 15 - 41 U/L 17 17 14(L)  ALT 0 - 44 U/L $Remo'22 18 16    'XdHRY$ Lab Results  Component Value Date   WBC 8.5 08/11/2021   HGB 10.9 (L) 08/11/2021   HCT 34.1 (L) 08/11/2021   MCV 91.4 08/11/2021   PLT 315 08/11/2021   NEUTROABS 5.4 08/11/2021    ASSESSMENT & PLAN:  Cancer of upper-outer quadrant of  female breast ([place]) 06/18/2021:Right lumpectomy: 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  Adjuvant chemotherapy with Taxol and Herceptin followed by Herceptin maintenance 2. discussion regarding pros and cons of reirradiation (Dr. Isidore Moos will need to inform us if she is eligible for radiation given her prior history of radiation therapy to the breast) ----------------------------------------------------------------------------------------------------------------------------------- Current treatment: Cycle 4 Taxol Herceptin Echocardiogram: EF 60 to 65%   Chemo toxicities: Fatigue otherwise tolerated the treatment extremely well. 2. Headaches and fullness in the right side of the head: She responded to prednisone.  I recommended that she stay on prednisone slightly longer with a tapering schedule.  I sent a new prescription for prednisone today. 3.  Chemotherapy-induced anemia: Today's hemoglobin is 10.9.   Return to clinic weekly for  chemo and every other week for follow-up with me    No orders of the defined types were placed in this encounter.  The patient has a good understanding of the overall plan. she agrees with it. she will call with any problems that may develop before the next visit here.  Total time spent: 30 mins including face to face time and time spent for planning, charting and coordination of care  Rulon Eisenmenger, MD, MPH 08/11/2021  I, Thana Ates, am acting as scribe for Dr. Nicholas Lose.  I have reviewed the above documentation for accuracy and completeness, and I agree with the above.

## 2021-08-11 ENCOUNTER — Inpatient Hospital Stay: Payer: Medicare Other

## 2021-08-11 ENCOUNTER — Other Ambulatory Visit: Payer: Self-pay

## 2021-08-11 ENCOUNTER — Inpatient Hospital Stay (HOSPITAL_BASED_OUTPATIENT_CLINIC_OR_DEPARTMENT_OTHER): Payer: Medicare Other | Admitting: Hematology and Oncology

## 2021-08-11 DIAGNOSIS — C50411 Malignant neoplasm of upper-outer quadrant of right female breast: Secondary | ICD-10-CM

## 2021-08-11 DIAGNOSIS — Z95828 Presence of other vascular implants and grafts: Secondary | ICD-10-CM

## 2021-08-11 DIAGNOSIS — Z5111 Encounter for antineoplastic chemotherapy: Secondary | ICD-10-CM | POA: Diagnosis not present

## 2021-08-11 LAB — CMP (CANCER CENTER ONLY)
ALT: 16 U/L (ref 0–44)
AST: 10 U/L — ABNORMAL LOW (ref 15–41)
Albumin: 3.7 g/dL (ref 3.5–5.0)
Alkaline Phosphatase: 73 U/L (ref 38–126)
Anion gap: 7 (ref 5–15)
BUN: 22 mg/dL — ABNORMAL HIGH (ref 6–20)
CO2: 23 mmol/L (ref 22–32)
Calcium: 8.8 mg/dL — ABNORMAL LOW (ref 8.9–10.3)
Chloride: 109 mmol/L (ref 98–111)
Creatinine: 0.66 mg/dL (ref 0.44–1.00)
GFR, Estimated: >60 mL/min (ref >60)
Glucose, Bld: 87 mg/dL (ref 70–99)
Potassium: 3.8 mmol/L (ref 3.5–5.1)
Sodium: 139 mmol/L (ref 135–145)
Total Bilirubin: 0.3 mg/dL (ref 0.3–1.2)
Total Protein: 6.6 g/dL (ref 6.5–8.1)

## 2021-08-11 LAB — CBC WITH DIFFERENTIAL (CANCER CENTER ONLY)
Abs Immature Granulocytes: 0.04 10*3/uL (ref 0.00–0.07)
Basophils Absolute: 0.1 10*3/uL (ref 0.0–0.1)
Basophils Relative: 1 %
Eosinophils Absolute: 0.1 10*3/uL (ref 0.0–0.5)
Eosinophils Relative: 1 %
HCT: 34.1 % — ABNORMAL LOW (ref 36.0–46.0)
Hemoglobin: 10.9 g/dL — ABNORMAL LOW (ref 12.0–15.0)
Immature Granulocytes: 1 %
Lymphocytes Relative: 27 %
Lymphs Abs: 2.3 10*3/uL (ref 0.7–4.0)
MCH: 29.2 pg (ref 26.0–34.0)
MCHC: 32 g/dL (ref 30.0–36.0)
MCV: 91.4 fL (ref 80.0–100.0)
Monocytes Absolute: 0.6 10*3/uL (ref 0.1–1.0)
Monocytes Relative: 7 %
Neutro Abs: 5.4 10*3/uL (ref 1.7–7.7)
Neutrophils Relative %: 63 %
Platelet Count: 315 10*3/uL (ref 150–400)
RBC: 3.73 MIL/uL — ABNORMAL LOW (ref 3.87–5.11)
RDW: 13.9 % (ref 11.5–15.5)
WBC Count: 8.5 10*3/uL (ref 4.0–10.5)
nRBC: 0 % (ref 0.0–0.2)

## 2021-08-11 MED ORDER — ACETAMINOPHEN 325 MG PO TABS
650.0000 mg | ORAL_TABLET | Freq: Once | ORAL | Status: AC
  Administered 2021-08-11: 650 mg via ORAL
  Filled 2021-08-11: qty 2

## 2021-08-11 MED ORDER — HEPARIN SOD (PORK) LOCK FLUSH 100 UNIT/ML IV SOLN
500.0000 [IU] | Freq: Once | INTRAVENOUS | Status: AC | PRN
  Administered 2021-08-11: 500 [IU]

## 2021-08-11 MED ORDER — SODIUM CHLORIDE 0.9 % IV SOLN
10.0000 mg | Freq: Once | INTRAVENOUS | Status: AC
  Administered 2021-08-11: 10 mg via INTRAVENOUS
  Filled 2021-08-11: qty 10

## 2021-08-11 MED ORDER — FAMOTIDINE 20 MG IN NS 100 ML IVPB
20.0000 mg | Freq: Once | INTRAVENOUS | Status: AC
  Administered 2021-08-11: 20 mg via INTRAVENOUS
  Filled 2021-08-11: qty 100

## 2021-08-11 MED ORDER — SODIUM CHLORIDE 0.9 % IV SOLN: Freq: Once | INTRAVENOUS | Status: AC

## 2021-08-11 MED ORDER — DIPHENHYDRAMINE HCL 50 MG/ML IJ SOLN
50.0000 mg | Freq: Once | INTRAMUSCULAR | Status: AC
  Administered 2021-08-11: 50 mg via INTRAVENOUS
  Filled 2021-08-11: qty 1

## 2021-08-11 MED ORDER — SODIUM CHLORIDE 0.9% FLUSH
10.0000 mL | Freq: Once | INTRAVENOUS | Status: AC
  Administered 2021-08-11: 10 mL

## 2021-08-11 MED ORDER — TRASTUZUMAB-ANNS CHEMO 150 MG IV SOLR
150.0000 mg | Freq: Once | INTRAVENOUS | Status: AC
  Administered 2021-08-11: 150 mg via INTRAVENOUS
  Filled 2021-08-11: qty 7.14

## 2021-08-11 MED ORDER — PREDNISONE 10 MG PO TABS: 10.0000 mg | ORAL_TABLET | Freq: Every day | ORAL | 0 refills | Status: DC

## 2021-08-11 MED ORDER — SODIUM CHLORIDE 0.9% FLUSH
10.0000 mL | INTRAVENOUS | Status: DC | PRN
  Administered 2021-08-11: 10 mL

## 2021-08-11 MED ORDER — PACLITAXEL CHEMO INJECTION 300 MG/50ML
65.0000 mg/m2 | Freq: Once | INTRAVENOUS | Status: AC
  Administered 2021-08-11: 114 mg via INTRAVENOUS
  Filled 2021-08-11: qty 19

## 2021-08-11 NOTE — Assessment & Plan Note (Signed)
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 Isidore Moos will need to informus if she is eligible for radiation given her prior history of radiation therapy to the breast) ----------------------------------------------------------------------------------------------------------------------------------- Current treatment: Cycle 4 Taxol Herceptin Echocardiogram: EF 60 to 65%  Chemo toxicities: Mild nausea and mild fatigue otherwise tolerated the treatment extremely well. Did not have any infusion reactions.  Return to clinic weekly for chemo and every other week for follow-up with me

## 2021-08-11 NOTE — Patient Instructions (Signed)
[facility] ONCOLOGY  Discharge Instructions: Thank you for choosing [facility] to provide your oncology and hematology care.   If you have a lab appointment with the [facility], please go directly to the [facility] and check in at the registration area.   Wear comfortable clothing and clothing appropriate for easy access to any Portacath or PICC line.   We strive to give you quality time with your provider. You may need to reschedule your appointment if you arrive late (15 or more minutes).  Arriving late affects you and other patients whose appointments are after yours.  Also, if you miss three or more appointments without notifying the office, you may be dismissed from the clinic at the provider's discretion.      For prescription refill requests, have your pharmacy contact our office and allow 72 hours for refills to be completed.    Today you received the following chemotherapy and/or immunotherapy agents [place], Taxol       To help prevent nausea and vomiting after your treatment, we encourage you to take your nausea medication as directed.  BELOW ARE SYMPTOMS THAT SHOULD BE REPORTED IMMEDIATELY: *FEVER GREATER THAN 100.4 F (38 C) OR HIGHER *CHILLS OR SWEATING *NAUSEA AND VOMITING THAT IS NOT CONTROLLED WITH YOUR NAUSEA MEDICATION *UNUSUAL SHORTNESS OF BREATH *UNUSUAL BRUISING OR BLEEDING *URINARY PROBLEMS (pain or burning when urinating, or frequent urination) *BOWEL PROBLEMS (unusual diarrhea, constipation, pain near the anus) TENDERNESS IN MOUTH AND THROAT WITH OR WITHOUT PRESENCE OF ULCERS (sore throat, sores in mouth, or a toothache) UNUSUAL RASH, SWELLING OR PAIN  UNUSUAL VAGINAL DISCHARGE OR ITCHING   Items with * indicate a potential emergency and should be followed up as soon as possible or go to the Emergency Department if any problems should occur.  Please show the CHEMOTHERAPY ALERT CARD or IMMUNOTHERAPY ALERT CARD at  check-in to the Emergency Department and triage nurse.  Should you have questions after your visit or need to cancel or reschedule your appointment, please contact [facility]  Dept: [phone]  and follow the prompts.  Office hours are 8:00 a.m. to 4:30 p.m. Monday - Friday. Please note that voicemails left after 4:00 p.m. may not be returned until the following business day.  We are closed weekends and major holidays. You have access to a nurse at all times for urgent questions. Please call the main number to the clinic Dept: [phone] and follow the prompts.   For any non-urgent questions, you may also contact your provider using MyChart. We now offer e-Visits for anyone 52 and older to request care online for non-urgent symptoms. For details visit [email or web address].   Also download the MyChart app! Go to the app store, search "MyChart", open the app, select [facility], and log in with your MyChart username and password.  Due to Covid, a mask is required upon entering the hospital/clinic. If you do not have a mask, one will be given to you upon arrival. For doctor visits, patients may have 1 support person aged 4 or older with them. For treatment visits, patients cannot have anyone with them due to current Covid guidelines and our immunocompromised population.

## 2021-08-14 ENCOUNTER — Telehealth: Payer: Self-pay | Admitting: Hematology and Oncology

## 2021-08-14 NOTE — Telephone Encounter (Signed)
Scheduled appointment per 10/27 los. Patient is aware.

## 2021-08-17 MED FILL — Dexamethasone Sodium Phosphate Inj 100 MG/10ML: INTRAMUSCULAR | Qty: 1 | Status: AC

## 2021-08-18 ENCOUNTER — Inpatient Hospital Stay: Payer: Medicare Other

## 2021-08-18 ENCOUNTER — Encounter: Payer: Self-pay | Admitting: *Deleted

## 2021-08-18 ENCOUNTER — Inpatient Hospital Stay: Payer: Medicare Other | Attending: Internal Medicine

## 2021-08-18 ENCOUNTER — Other Ambulatory Visit: Payer: Self-pay

## 2021-08-18 VITALS — BP 120/67 | HR 73 | Temp 97.6°F | Resp 18

## 2021-08-18 DIAGNOSIS — R519 Headache, unspecified: Secondary | ICD-10-CM | POA: Diagnosis not present

## 2021-08-18 DIAGNOSIS — R5383 Other fatigue: Secondary | ICD-10-CM | POA: Insufficient documentation

## 2021-08-18 DIAGNOSIS — Z23 Encounter for immunization: Secondary | ICD-10-CM | POA: Insufficient documentation

## 2021-08-18 DIAGNOSIS — D6481 Anemia due to antineoplastic chemotherapy: Secondary | ICD-10-CM | POA: Insufficient documentation

## 2021-08-18 DIAGNOSIS — Z170 Estrogen receptor positive status [ER+]: Secondary | ICD-10-CM | POA: Insufficient documentation

## 2021-08-18 DIAGNOSIS — R197 Diarrhea, unspecified: Secondary | ICD-10-CM | POA: Insufficient documentation

## 2021-08-18 DIAGNOSIS — Z95828 Presence of other vascular implants and grafts: Secondary | ICD-10-CM

## 2021-08-18 DIAGNOSIS — C50411 Malignant neoplasm of upper-outer quadrant of right female breast: Secondary | ICD-10-CM

## 2021-08-18 DIAGNOSIS — Z5111 Encounter for antineoplastic chemotherapy: Secondary | ICD-10-CM | POA: Insufficient documentation

## 2021-08-18 LAB — CMP (CANCER CENTER ONLY)
ALT: 14 U/L (ref 0–44)
AST: 13 U/L — ABNORMAL LOW (ref 15–41)
Albumin: 3.7 g/dL (ref 3.5–5.0)
Alkaline Phosphatase: 76 U/L (ref 38–126)
Anion gap: 8 (ref 5–15)
BUN: 13 mg/dL (ref 6–20)
CO2: 23 mmol/L (ref 22–32)
Calcium: 9 mg/dL (ref 8.9–10.3)
Chloride: 108 mmol/L (ref 98–111)
Creatinine: 0.7 mg/dL (ref 0.44–1.00)
GFR, Estimated: >60 mL/min (ref >60)
Glucose, Bld: 94 mg/dL (ref 70–99)
Potassium: 3.8 mmol/L (ref 3.5–5.1)
Sodium: 139 mmol/L (ref 135–145)
Total Bilirubin: 0.2 mg/dL — ABNORMAL LOW (ref 0.3–1.2)
Total Protein: 6.6 g/dL (ref 6.5–8.1)

## 2021-08-18 LAB — CBC WITH DIFFERENTIAL (CANCER CENTER ONLY)
Abs Immature Granulocytes: 0.02 10*3/uL (ref 0.00–0.07)
Basophils Absolute: 0 10*3/uL (ref 0.0–0.1)
Basophils Relative: 0 %
Eosinophils Absolute: 0.1 10*3/uL (ref 0.0–0.5)
Eosinophils Relative: 1 %
HCT: 35.4 % — ABNORMAL LOW (ref 36.0–46.0)
Hemoglobin: 11.4 g/dL — ABNORMAL LOW (ref 12.0–15.0)
Immature Granulocytes: 0 %
Lymphocytes Relative: 28 %
Lymphs Abs: 2 10*3/uL (ref 0.7–4.0)
MCH: 29.1 pg (ref 26.0–34.0)
MCHC: 32.2 g/dL (ref 30.0–36.0)
MCV: 90.3 fL (ref 80.0–100.0)
Monocytes Absolute: 0.5 10*3/uL (ref 0.1–1.0)
Monocytes Relative: 7 %
Neutro Abs: 4.7 10*3/uL (ref 1.7–7.7)
Neutrophils Relative %: 64 %
Platelet Count: 316 10*3/uL (ref 150–400)
RBC: 3.92 MIL/uL (ref 3.87–5.11)
RDW: 13.7 % (ref 11.5–15.5)
WBC Count: 7.4 10*3/uL (ref 4.0–10.5)
nRBC: 0 % (ref 0.0–0.2)

## 2021-08-18 MED ORDER — SODIUM CHLORIDE 0.9% FLUSH
10.0000 mL | Freq: Once | INTRAVENOUS | Status: AC
  Administered 2021-08-18: 10 mL

## 2021-08-18 MED ORDER — DIPHENHYDRAMINE HCL 50 MG/ML IJ SOLN
50.0000 mg | Freq: Once | INTRAMUSCULAR | Status: AC
  Administered 2021-08-18: 50 mg via INTRAVENOUS
  Filled 2021-08-18: qty 1

## 2021-08-18 MED ORDER — HEPARIN SOD (PORK) LOCK FLUSH 100 UNIT/ML IV SOLN
500.0000 [IU] | Freq: Once | INTRAVENOUS | Status: AC | PRN
  Administered 2021-08-18: 500 [IU]

## 2021-08-18 MED ORDER — PACLITAXEL CHEMO INJECTION 300 MG/50ML
65.0000 mg/m2 | Freq: Once | INTRAVENOUS | Status: AC
  Administered 2021-08-18: 114 mg via INTRAVENOUS
  Filled 2021-08-18: qty 19

## 2021-08-18 MED ORDER — SODIUM CHLORIDE 0.9 % IV SOLN
10.0000 mg | Freq: Once | INTRAVENOUS | Status: AC
  Administered 2021-08-18: 10 mg via INTRAVENOUS
  Filled 2021-08-18: qty 10

## 2021-08-18 MED ORDER — SODIUM CHLORIDE 0.9% FLUSH
10.0000 mL | INTRAVENOUS | Status: DC | PRN
  Administered 2021-08-18: 10 mL

## 2021-08-18 MED ORDER — ACETAMINOPHEN 325 MG PO TABS
650.0000 mg | ORAL_TABLET | Freq: Once | ORAL | Status: AC
  Administered 2021-08-18: 650 mg via ORAL
  Filled 2021-08-18: qty 2

## 2021-08-18 MED ORDER — TRASTUZUMAB-ANNS CHEMO 150 MG IV SOLR
150.0000 mg | Freq: Once | INTRAVENOUS | Status: AC
  Administered 2021-08-18: 150 mg via INTRAVENOUS
  Filled 2021-08-18: qty 7.14

## 2021-08-18 MED ORDER — SODIUM CHLORIDE 0.9 % IV SOLN
Freq: Once | INTRAVENOUS | Status: AC
Start: 2021-08-18 — End: 2021-08-18

## 2021-08-18 MED ORDER — FAMOTIDINE 20 MG IN NS 100 ML IVPB
20.0000 mg | Freq: Once | INTRAVENOUS | Status: AC
  Administered 2021-08-18: 20 mg via INTRAVENOUS
  Filled 2021-08-18: qty 100

## 2021-08-18 NOTE — Patient Instructions (Signed)
[facility] ONCOLOGY  Discharge Instructions: Thank you for choosing [facility] to provide your oncology and hematology care.   If you have a lab appointment with the [facility], please go directly to the [facility] and check in at the registration area.   Wear comfortable clothing and clothing appropriate for easy access to any Portacath or PICC line.   We strive to give you quality time with your provider. You may need to reschedule your appointment if you arrive late (15 or more minutes).  Arriving late affects you and other patients whose appointments are after yours.  Also, if you miss three or more appointments without notifying the office, you may be dismissed from the clinic at the provider's discretion.      For prescription refill requests, have your pharmacy contact our office and allow 72 hours for refills to be completed.    Today you received the following chemotherapy and/or immunotherapy agents herceptin, paclitaxel      To help prevent nausea and vomiting after your treatment, we encourage you to take your nausea medication as directed.  BELOW ARE SYMPTOMS THAT SHOULD BE REPORTED IMMEDIATELY: *FEVER GREATER THAN 100.4 F (38 C) OR HIGHER *CHILLS OR SWEATING *NAUSEA AND VOMITING THAT IS NOT CONTROLLED WITH YOUR NAUSEA MEDICATION *UNUSUAL SHORTNESS OF BREATH *UNUSUAL BRUISING OR BLEEDING *URINARY PROBLEMS (pain or burning when urinating, or frequent urination) *BOWEL PROBLEMS (unusual diarrhea, constipation, pain near the anus) TENDERNESS IN MOUTH AND THROAT WITH OR WITHOUT PRESENCE OF ULCERS (sore throat, sores in mouth, or a toothache) UNUSUAL RASH, SWELLING OR PAIN  UNUSUAL VAGINAL DISCHARGE OR ITCHING   Items with * indicate a potential emergency and should be followed up as soon as possible or go to the Emergency Department if any problems should occur.  Please show the CHEMOTHERAPY ALERT CARD or IMMUNOTHERAPY ALERT CARD at  check-in to the Emergency Department and triage nurse.  Should you have questions after your visit or need to cancel or reschedule your appointment, please contact [facility]  Dept: [phone]  and follow the prompts.  Office hours are 8:00 a.m. to 4:30 p.m. Monday - Friday. Please note that voicemails left after 4:00 p.m. may not be returned until the following business day.  We are closed weekends and major holidays. You have access to a nurse at all times for urgent questions. Please call the main number to the clinic Dept: [phone] and follow the prompts.   For any non-urgent questions, you may also contact your provider using MyChart. We now offer e-Visits for anyone 55 and older to request care online for non-urgent symptoms. For details visit [email or web address].   Also download the MyChart app! Go to the app store, search "MyChart", open the app, select [facility], and log in with your MyChart username and password.  Due to Covid, a mask is required upon entering the hospital/clinic. If you do not have a mask, one will be given to you upon arrival. For doctor visits, patients may have 1 support person aged 57 or older with them. For treatment visits, patients cannot have anyone with them due to current Covid guidelines and our immunocompromised population.

## 2021-08-20 ENCOUNTER — Encounter: Payer: Self-pay | Admitting: General Practice

## 2021-08-20 NOTE — Progress Notes (Signed)
[facility] Spiritual Care Note  Referred by Edwena Blow Voss/RN in infusion for pastoral check-in. Reached Ms Sheils by phone, learning that her frustration was with the wait time for her treatment. Provided empathic listening, emotional support, and offer of ongoing Spiritual Care availability. We plan to follow up in person at her next treatment, and she took my direct-dial number as well.   [place], [place], [facility] Pager [phone] Voicemail [phone]

## 2021-08-24 MED FILL — Dexamethasone Sodium Phosphate Inj 100 MG/10ML: INTRAMUSCULAR | Qty: 1 | Status: AC

## 2021-08-24 NOTE — Progress Notes (Signed)
Patient Care Team: Luetta Nutting, DO as PCP - General (Family Medicine) Eppie Gibson, MD as Consulting Physician (Radiation Oncology) Mickeal Skinner Acey Lav, MD as Consulting Physician (Oncology) [facility] (Ophthalmology) Bridget Hartshorn, Noel Journey, MD as Referring Physician (Dermatology) Helayne Seminole, MD as Referring Physician (Otolaryngology) Mauro Kaufmann, RN as Oncology Nurse Navigator Rockwell Germany, RN as Oncology Nurse Navigator  DIAGNOSIS:    ICD-10-CM   1. Malignant neoplasm of upper-outer quadrant of right female breast, unspecified estrogen receptor status ([place])  C50.411       SUMMARY OF ONCOLOGIC HISTORY: Oncology History  Cancer of upper-outer quadrant of female breast ([place])  2013 Initial Diagnosis   Right breast cancer: Stage Ia ER/PR positive HER2 negative status postlumpectomy, radiation (low risk Oncotype) could not tolerate tamoxifen for more than 30 days.   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 No pathogenic variants identified on the [facility]ae Multi-Cancer Panel+RNA. VUS in POLD1 identified called c.2429C>T. The report date is 07/01/2021.  The Multi-Cancer Panel + RNA offered by [facility] includes sequencing and/or deletion duplication testing of the following 84 genes: AIP, ALK, APC, ATM, AXIN2,BAP1,  BARD1, BLM, BMPR1A, BRCA1, BRCA2, BRIP1, CASR, CDC73, CDH1, CDK4, CDKN1B, CDKN1C, CDKN2A (p14ARF), CDKN2A (p16INK4a), CEBPA, CHEK2, CTNNA1, DICER1, DIS3L2, EGFR (c.2369C>T, p.Thr790Met variant only), EPCAM (Deletion/duplication testing only), FH, FLCN, GATA2, GPC3, GREM1 (Promoter region deletion/duplication testing only), HOXB13 (c.251G>A, p.Gly84Glu), HRAS, KIT, MAX, MEN1, MET, MITF (c.952G>A, p.Glu318Lys variant only), MLH1, MSH2, MSH3, MSH6, MUTYH, NBN, NF1, NF2, NTHL1, PALB2, PDGFRA, PHOX2B, PMS2, POLD1, POLE, POT1, PRKAR1A, PTCH1, PTEN, RAD50, RAD51C, RAD51D, RB1, RECQL4, RET, RUNX1, SDHAF2, SDHA (sequence changes only), SDHB, SDHC, SDHD, SMAD4, SMARCA4, SMARCB1, SMARCE1, STK11, SUFU, TERC, TERT, TMEM127, TP53, TSC1, TSC2, VHL, WRN and WT1.   07/21/2021 -  Chemotherapy   Patient is on Treatment Plan : BREAST Paclitaxel + Trastuzumab q7d / Trastuzumab q21d       CHIEF COMPLIANT: Cycle 6 Taxol Herceptin  INTERVAL HISTORY: Lori Mcmillan is a 60 y.o. with above-mentioned history of right breast cancer having undergone lumpectomy, currently on chemotherapy with Herceptin. She presents to the clinic today for treatment.  She is tolerating chemotherapy fairly well.  She had couple of days of diarrhea which got better with yogurt.  Denies any nausea or vomiting.  ALLERGIES:  is allergic to lidocaine.  MEDICATIONS:  Current Outpatient Medications  Medication Sig Dispense Refill   ALPRAZolam (XANAX) 0.25 MG tablet Take 1 tablet (0.25 mg total) by mouth 2 (two) times daily as needed for anxiety. 30 tablet 1  diazepam (VALIUM) 2 MG tablet Take by mouth.      lamoTRIgine (LAMICTAL) 25 MG tablet Take 2 tablets (50 mg total) by mouth daily. 60 tablet 3   lidocaine-prilocaine (EMLA) cream Apply to affected area once 30 g 3   ondansetron (ZOFRAN) 8 MG tablet Take 1 tablet (8 mg total) by mouth 2 (two) times daily as needed (Nausea or vomiting). 30 tablet 1   predniSONE (DELTASONE) 10 MG tablet Take 1 tablet (10 mg total) by mouth daily with breakfast. 60 tablet 0   prochlorperazine (COMPAZINE) 10 MG tablet Take 1 tablet (10 mg total) by mouth every 6 (six) hours as needed (Nausea or vomiting). 30 tablet 1   No current facility-administered medications for this visit.    PHYSICAL EXAMINATION: ECOG PERFORMANCE STATUS: 1 - Symptomatic but completely ambulatory  Vitals:   08/25/21 1200  BP: 128/78  Pulse: 78  Resp: 18  Temp: (!) 97.2 F (36.2 C)  SpO2: 99%   Filed Weights   08/25/21 1200  Weight: 158 lb 8 oz (71.9 kg)    LABORATORY DATA:  I have reviewed the data as listed CMP Latest Ref Rng & Units 08/18/2021 08/11/2021 08/04/2021  Glucose 70 - 99 mg/dL 94 87 95  BUN 6 - 20 mg/dL 13 22(H) 14  Creatinine 0.44 - 1.00 mg/dL 0.70 0.66 0.71  Sodium 135 - 145 mmol/L 139 139 139  Potassium 3.5 - 5.1 mmol/L 3.8 3.8 3.9  Chloride 98 - 111 mmol/L 108 109 109  CO2 22 - 32 mmol/L _0 Calcium 8.9 - 10.3 mg/dL 9.0 8.8(L) 8.9  Total Protein 6.5 - 8.1 g/dL 6.6 6.6 6.8  Total Bilirubin 0.3 - 1.2 mg/dL 0.2(L) 0.3 0.2(L)  Alkaline Phos 38 - 126 U/L 76 73 71  AST 15 - 41 U/L 13(L) 10(L) 17  ALT 0 - 44 U/L _1 Lab Results  Component Value Date   WBC 7.9 08/25/2021   HGB 11.0 (L) 08/25/2021   HCT 34.0 (L) 08/25/2021   MCV 90.9 08/25/2021   PLT 296 08/25/2021   NEUTROABS 5.3 08/25/2021    ASSESSMENT & PLAN:  Cancer of upper-outer quadrant of female breast ([place]) 06/18/2021:Right lumpectomy: Grade 3 IDC 1.5 cm, high-grade DCIS, margins negative, 0/2 lymph nodes negative, ER 0%, PR 0%, HER2 3+ positive, Ki-67 30%    (2013 Right breast  cancer: Stage Ia ER/PR positive HER2 negative status postlumpectomy, radiation (low risk Oncotype) could not tolerate tamoxifen for more than 30 days.)   Treatment plan: 1.  Adjuvant chemotherapy with Taxol and Herceptin followed by Herceptin maintenance 2. discussion regarding pros and cons of reirradiation (Dr. Isidore Moos will need to inform us if she is eligible for radiation given her prior history of radiation therapy to the breast) ----------------------------------------------------------------------------------------------------------------------------------- Current treatment: Cycle 6 Taxol Herceptin Echocardiogram: EF 60 to 65%   Chemo toxicities: Fatigue otherwise tolerated the treatment extremely well. 2. Headaches and fullness in the right side of the head: She responded to prednisone.  3.  Chemotherapy-induced anemia: Today's hemoglobin is 11 4.  Diarrhea: Being monitored     Return to clinic weekly for chemo and every other week for follow-up with me      No orders of the defined types were placed in this encounter.  The patient has a good understanding of the overall plan. she agrees with it. she will call with any problems that may develop before the next visit here.  Total time spent: 30  mins including face to face time and time spent for planning, charting and coordination of care  Rulon Eisenmenger, MD, MPH 08/25/2021  I, Thana Ates, am acting as scribe for Dr. Nicholas Lose.  I have reviewed the above documentation for accuracy and completeness, and I agree with the above.

## 2021-08-25 ENCOUNTER — Encounter: Payer: Self-pay | Admitting: General Practice

## 2021-08-25 ENCOUNTER — Inpatient Hospital Stay: Payer: Medicare Other

## 2021-08-25 ENCOUNTER — Other Ambulatory Visit: Payer: Self-pay

## 2021-08-25 ENCOUNTER — Inpatient Hospital Stay (HOSPITAL_BASED_OUTPATIENT_CLINIC_OR_DEPARTMENT_OTHER): Payer: Medicare Other | Admitting: Hematology and Oncology

## 2021-08-25 DIAGNOSIS — Z170 Estrogen receptor positive status [ER+]: Secondary | ICD-10-CM | POA: Diagnosis not present

## 2021-08-25 DIAGNOSIS — R5383 Other fatigue: Secondary | ICD-10-CM | POA: Diagnosis not present

## 2021-08-25 DIAGNOSIS — Z23 Encounter for immunization: Secondary | ICD-10-CM | POA: Diagnosis not present

## 2021-08-25 DIAGNOSIS — R519 Headache, unspecified: Secondary | ICD-10-CM | POA: Diagnosis not present

## 2021-08-25 DIAGNOSIS — Z5111 Encounter for antineoplastic chemotherapy: Secondary | ICD-10-CM | POA: Diagnosis not present

## 2021-08-25 DIAGNOSIS — C50411 Malignant neoplasm of upper-outer quadrant of right female breast: Secondary | ICD-10-CM | POA: Diagnosis not present

## 2021-08-25 DIAGNOSIS — Z95828 Presence of other vascular implants and grafts: Secondary | ICD-10-CM

## 2021-08-25 DIAGNOSIS — R197 Diarrhea, unspecified: Secondary | ICD-10-CM | POA: Diagnosis not present

## 2021-08-25 DIAGNOSIS — D6481 Anemia due to antineoplastic chemotherapy: Secondary | ICD-10-CM | POA: Diagnosis not present

## 2021-08-25 LAB — CBC WITH DIFFERENTIAL (CANCER CENTER ONLY)
Abs Immature Granulocytes: 0.03 10*3/uL (ref 0.00–0.07)
Basophils Absolute: 0 10*3/uL (ref 0.0–0.1)
Basophils Relative: 1 %
Eosinophils Absolute: 0.1 10*3/uL (ref 0.0–0.5)
Eosinophils Relative: 1 %
HCT: 34 % — ABNORMAL LOW (ref 36.0–46.0)
Hemoglobin: 11 g/dL — ABNORMAL LOW (ref 12.0–15.0)
Immature Granulocytes: 0 %
Lymphocytes Relative: 24 %
Lymphs Abs: 1.9 10*3/uL (ref 0.7–4.0)
MCH: 29.4 pg (ref 26.0–34.0)
MCHC: 32.4 g/dL (ref 30.0–36.0)
MCV: 90.9 fL (ref 80.0–100.0)
Monocytes Absolute: 0.5 10*3/uL (ref 0.1–1.0)
Monocytes Relative: 7 %
Neutro Abs: 5.3 10*3/uL (ref 1.7–7.7)
Neutrophils Relative %: 67 %
Platelet Count: 296 10*3/uL (ref 150–400)
RBC: 3.74 MIL/uL — ABNORMAL LOW (ref 3.87–5.11)
RDW: 13.4 % (ref 11.5–15.5)
WBC Count: 7.9 10*3/uL (ref 4.0–10.5)
nRBC: 0 % (ref 0.0–0.2)

## 2021-08-25 LAB — CMP (CANCER CENTER ONLY)
ALT: 13 U/L (ref 0–44)
AST: 14 U/L — ABNORMAL LOW (ref 15–41)
Albumin: 3.7 g/dL (ref 3.5–5.0)
Alkaline Phosphatase: 83 U/L (ref 38–126)
Anion gap: 10 (ref 5–15)
BUN: 10 mg/dL (ref 6–20)
CO2: 21 mmol/L — ABNORMAL LOW (ref 22–32)
Calcium: 8.6 mg/dL — ABNORMAL LOW (ref 8.9–10.3)
Chloride: 107 mmol/L (ref 98–111)
Creatinine: 0.7 mg/dL (ref 0.44–1.00)
GFR, Estimated: >60 mL/min (ref >60)
Glucose, Bld: 91 mg/dL (ref 70–99)
Potassium: 3.9 mmol/L (ref 3.5–5.1)
Sodium: 138 mmol/L (ref 135–145)
Total Bilirubin: 0.3 mg/dL (ref 0.3–1.2)
Total Protein: 6.7 g/dL (ref 6.5–8.1)

## 2021-08-25 MED ORDER — SODIUM CHLORIDE 0.9 % IV SOLN
65.0000 mg/m2 | Freq: Once | INTRAVENOUS | Status: AC
  Administered 2021-08-25: 114 mg via INTRAVENOUS
  Filled 2021-08-25: qty 19

## 2021-08-25 MED ORDER — INFLUENZA VAC SPLIT QUAD 0.5 ML IM SUSY
0.5000 mL | PREFILLED_SYRINGE | Freq: Once | INTRAMUSCULAR | Status: AC
  Administered 2021-08-25: 0.5 mL via INTRAMUSCULAR
  Filled 2021-08-25: qty 0.5

## 2021-08-25 MED ORDER — SODIUM CHLORIDE 0.9 % IV SOLN: Freq: Once | INTRAVENOUS | Status: AC

## 2021-08-25 MED ORDER — ACETAMINOPHEN 325 MG PO TABS
650.0000 mg | ORAL_TABLET | Freq: Once | ORAL | Status: AC
  Administered 2021-08-25: 650 mg via ORAL
  Filled 2021-08-25: qty 2

## 2021-08-25 MED ORDER — SODIUM CHLORIDE 0.9% FLUSH
10.0000 mL | INTRAVENOUS | Status: DC | PRN
  Administered 2021-08-25: 10 mL

## 2021-08-25 MED ORDER — HEPARIN SOD (PORK) LOCK FLUSH 100 UNIT/ML IV SOLN
500.0000 [IU] | Freq: Once | INTRAVENOUS | Status: AC | PRN
  Administered 2021-08-25: 500 [IU]

## 2021-08-25 MED ORDER — TRASTUZUMAB-ANNS CHEMO 150 MG IV SOLR
150.0000 mg | Freq: Once | INTRAVENOUS | Status: AC
  Administered 2021-08-25: 150 mg via INTRAVENOUS
  Filled 2021-08-25: qty 7.14

## 2021-08-25 MED ORDER — SODIUM CHLORIDE 0.9 % IV SOLN
10.0000 mg | Freq: Once | INTRAVENOUS | Status: AC
  Administered 2021-08-25: 10 mg via INTRAVENOUS
  Filled 2021-08-25: qty 10

## 2021-08-25 MED ORDER — SODIUM CHLORIDE 0.9% FLUSH
10.0000 mL | Freq: Once | INTRAVENOUS | Status: AC
  Administered 2021-08-25: 10 mL

## 2021-08-25 MED ORDER — FAMOTIDINE 20 MG IN NS 100 ML IVPB
20.0000 mg | Freq: Once | INTRAVENOUS | Status: AC
  Administered 2021-08-25: 20 mg via INTRAVENOUS
  Filled 2021-08-25: qty 100

## 2021-08-25 MED ORDER — DIPHENHYDRAMINE HCL 50 MG/ML IJ SOLN
50.0000 mg | Freq: Once | INTRAMUSCULAR | Status: AC
  Administered 2021-08-25: 50 mg via INTRAVENOUS
  Filled 2021-08-25: qty 1

## 2021-08-25 NOTE — Assessment & Plan Note (Signed)
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 Isidore Moos will need to informus if she is eligible for radiation given her prior history of radiation therapy to the breast) ----------------------------------------------------------------------------------------------------------------------------------- Current treatment: Cycle6Taxol Herceptin Echocardiogram: EF 60 to 65%  Chemo toxicities: 1. Fatigue otherwise tolerated the treatment extremely well. 2. Headaches and fullness in the right side of the head: She responded to prednisone.  I recommended that she stay on prednisone slightly longer with a tapering schedule.  I sent a new prescription for prednisone today. 3.  Chemotherapy-induced anemia: Today's hemoglobin is 10.9.   Return to clinic weekly for chemo and every other week for follow-up with me

## 2021-08-25 NOTE — Progress Notes (Signed)
[facility] Spiritual Care Note  Followed up with Ms Sabatino in infusion as planned. She was in much better spirits today and relieved to be reaching the halfway point in this phase of her treatment. She is using perspective, gratitude, and wisdom from aphorisms and scripture verses to cope. Pithy sayings give her a constructive way to focus her energy and attention. Ms Rhames is appreciative of [place] and plans to phone chaplain as needs arise.   [place], [place], [facility] Pager [phone] Voicemail [phone]

## 2021-08-25 NOTE — Patient Instructions (Signed)
[facility]   Discharge Instructions: Thank you for choosing [facility] to provide your oncology and hematology care.   If you have a lab appointment with the [facility], please go directly to the [facility] and check in at the registration area.   Wear comfortable clothing and clothing appropriate for easy access to any Portacath or PICC line.   We strive to give you quality time with your provider. You may need to reschedule your appointment if you arrive late (15 or more minutes).  Arriving late affects you and other patients whose appointments are after yours.  Also, if you miss three or more appointments without notifying the office, you may be dismissed from the clinic at the provider's discretion.      For prescription refill requests, have your pharmacy contact our office and allow 72 hours for refills to be completed.    Today you received the following chemotherapy and/or immunotherapy agents: trastuzumab-anns and paclitaxel.      To help prevent nausea and vomiting after your treatment, we encourage you to take your nausea medication as directed.  BELOW ARE SYMPTOMS THAT SHOULD BE REPORTED IMMEDIATELY: *FEVER GREATER THAN 100.4 F (38 C) OR HIGHER *CHILLS OR SWEATING *NAUSEA AND VOMITING THAT IS NOT CONTROLLED WITH YOUR NAUSEA MEDICATION *UNUSUAL SHORTNESS OF BREATH *UNUSUAL BRUISING OR BLEEDING *URINARY PROBLEMS (pain or burning when urinating, or frequent urination) *BOWEL PROBLEMS (unusual diarrhea, constipation, pain near the anus) TENDERNESS IN MOUTH AND THROAT WITH OR WITHOUT PRESENCE OF ULCERS (sore throat, sores in mouth, or a toothache) UNUSUAL RASH, SWELLING OR PAIN  UNUSUAL VAGINAL DISCHARGE OR ITCHING   Items with * indicate a potential emergency and should be followed up as soon as possible or go to the Emergency Department if any problems should occur.  Please show the CHEMOTHERAPY ALERT CARD or IMMUNOTHERAPY  ALERT CARD at check-in to the Emergency Department and triage nurse.  Should you have questions after your visit or need to cancel or reschedule your appointment, please contact [facility]  Dept: [phone]  and follow the prompts.  Office hours are 8:00 a.m. to 4:30 p.m. Monday - Friday. Please note that voicemails left after 4:00 p.m. may not be returned until the following business day.  We are closed weekends and major holidays. You have access to a nurse at all times for urgent questions. Please call the main number to the clinic Dept: [phone] and follow the prompts.   For any non-urgent questions, you may also contact your provider using MyChart. We now offer e-Visits for anyone 60 and older to request care online for non-urgent symptoms. For details visit [email or web address].   Also download the MyChart app! Go to the app store, search "MyChart", open the app, select [place], and log in with your MyChart username and password.  Due to Covid, a mask is required upon entering the hospital/clinic. If you do not have a mask, one will be given to you upon arrival. For doctor visits, patients may have 1 support person aged 34 or older with them. For treatment visits, patients cannot have anyone with them due to current Covid guidelines and our immunocompromised population.

## 2021-08-28 ENCOUNTER — Other Ambulatory Visit: Payer: Self-pay | Admitting: Internal Medicine

## 2021-08-31 MED FILL — Dexamethasone Sodium Phosphate Inj 100 MG/10ML: INTRAMUSCULAR | Qty: 1 | Status: AC

## 2021-09-01 ENCOUNTER — Inpatient Hospital Stay: Payer: Medicare Other

## 2021-09-01 ENCOUNTER — Other Ambulatory Visit: Payer: Self-pay

## 2021-09-01 VITALS — BP 122/86 | HR 70 | Temp 97.6°F | Resp 18 | Wt 161.4 lb

## 2021-09-01 DIAGNOSIS — C50411 Malignant neoplasm of upper-outer quadrant of right female breast: Secondary | ICD-10-CM

## 2021-09-01 DIAGNOSIS — Z95828 Presence of other vascular implants and grafts: Secondary | ICD-10-CM

## 2021-09-01 DIAGNOSIS — D6481 Anemia due to antineoplastic chemotherapy: Secondary | ICD-10-CM | POA: Diagnosis not present

## 2021-09-01 DIAGNOSIS — R197 Diarrhea, unspecified: Secondary | ICD-10-CM | POA: Diagnosis not present

## 2021-09-01 DIAGNOSIS — R519 Headache, unspecified: Secondary | ICD-10-CM | POA: Diagnosis not present

## 2021-09-01 DIAGNOSIS — R5383 Other fatigue: Secondary | ICD-10-CM | POA: Diagnosis not present

## 2021-09-01 DIAGNOSIS — Z5111 Encounter for antineoplastic chemotherapy: Secondary | ICD-10-CM | POA: Diagnosis not present

## 2021-09-01 DIAGNOSIS — Z170 Estrogen receptor positive status [ER+]: Secondary | ICD-10-CM | POA: Diagnosis not present

## 2021-09-01 DIAGNOSIS — Z23 Encounter for immunization: Secondary | ICD-10-CM | POA: Diagnosis not present

## 2021-09-01 LAB — CBC WITH DIFFERENTIAL (CANCER CENTER ONLY)
Abs Immature Granulocytes: 0.03 10*3/uL (ref 0.00–0.07)
Basophils Absolute: 0 10*3/uL (ref 0.0–0.1)
Basophils Relative: 1 %
Eosinophils Absolute: 0.2 10*3/uL (ref 0.0–0.5)
Eosinophils Relative: 2 %
HCT: 34.4 % — ABNORMAL LOW (ref 36.0–46.0)
Hemoglobin: 11 g/dL — ABNORMAL LOW (ref 12.0–15.0)
Immature Granulocytes: 1 %
Lymphocytes Relative: 27 %
Lymphs Abs: 1.8 10*3/uL (ref 0.7–4.0)
MCH: 29.3 pg (ref 26.0–34.0)
MCHC: 32 g/dL (ref 30.0–36.0)
MCV: 91.5 fL (ref 80.0–100.0)
Monocytes Absolute: 0.5 10*3/uL (ref 0.1–1.0)
Monocytes Relative: 7 %
Neutro Abs: 4.1 10*3/uL (ref 1.7–7.7)
Neutrophils Relative %: 62 %
Platelet Count: 318 10*3/uL (ref 150–400)
RBC: 3.76 MIL/uL — ABNORMAL LOW (ref 3.87–5.11)
RDW: 13.9 % (ref 11.5–15.5)
WBC Count: 6.6 10*3/uL (ref 4.0–10.5)
nRBC: 0 % (ref 0.0–0.2)

## 2021-09-01 LAB — CMP (CANCER CENTER ONLY)
ALT: 15 U/L (ref 0–44)
AST: 14 U/L — ABNORMAL LOW (ref 15–41)
Albumin: 3.7 g/dL (ref 3.5–5.0)
Alkaline Phosphatase: 82 U/L (ref 38–126)
Anion gap: 9 (ref 5–15)
BUN: 14 mg/dL (ref 6–20)
CO2: 21 mmol/L — ABNORMAL LOW (ref 22–32)
Calcium: 8.8 mg/dL — ABNORMAL LOW (ref 8.9–10.3)
Chloride: 108 mmol/L (ref 98–111)
Creatinine: 0.73 mg/dL (ref 0.44–1.00)
GFR, Estimated: >60 mL/min (ref >60)
Glucose, Bld: 94 mg/dL (ref 70–99)
Potassium: 3.9 mmol/L (ref 3.5–5.1)
Sodium: 138 mmol/L (ref 135–145)
Total Bilirubin: <0.2 mg/dL — ABNORMAL LOW (ref 0.3–1.2)
Total Protein: 6.8 g/dL (ref 6.5–8.1)

## 2021-09-01 MED ORDER — TRASTUZUMAB-ANNS CHEMO 150 MG IV SOLR
150.0000 mg | Freq: Once | INTRAVENOUS | Status: AC
  Administered 2021-09-01: 150 mg via INTRAVENOUS
  Filled 2021-09-01: qty 7.14

## 2021-09-01 MED ORDER — SODIUM CHLORIDE 0.9 % IV SOLN: Freq: Once | INTRAVENOUS | Status: AC

## 2021-09-01 MED ORDER — SODIUM CHLORIDE 0.9 % IV SOLN
10.0000 mg | Freq: Once | INTRAVENOUS | Status: AC
  Administered 2021-09-01: 10 mg via INTRAVENOUS
  Filled 2021-09-01: qty 10

## 2021-09-01 MED ORDER — SODIUM CHLORIDE 0.9% FLUSH
10.0000 mL | Freq: Once | INTRAVENOUS | Status: AC
Start: 2021-09-01 — End: 2021-09-01
  Administered 2021-09-01: 10 mL

## 2021-09-01 MED ORDER — DIPHENHYDRAMINE HCL 50 MG/ML IJ SOLN
50.0000 mg | Freq: Once | INTRAMUSCULAR | Status: AC
  Administered 2021-09-01: 50 mg via INTRAVENOUS
  Filled 2021-09-01: qty 1

## 2021-09-01 MED ORDER — SODIUM CHLORIDE 0.9 % IV SOLN
65.0000 mg/m2 | Freq: Once | INTRAVENOUS | Status: AC
  Administered 2021-09-01: 114 mg via INTRAVENOUS
  Filled 2021-09-01: qty 19

## 2021-09-01 MED ORDER — ACETAMINOPHEN 325 MG PO TABS
650.0000 mg | ORAL_TABLET | Freq: Once | ORAL | Status: AC
  Administered 2021-09-01: 650 mg via ORAL
  Filled 2021-09-01: qty 2

## 2021-09-01 MED ORDER — SODIUM CHLORIDE 0.9% FLUSH
10.0000 mL | INTRAVENOUS | Status: DC | PRN
  Administered 2021-09-01: 10 mL

## 2021-09-01 MED ORDER — HEPARIN SOD (PORK) LOCK FLUSH 100 UNIT/ML IV SOLN
500.0000 [IU] | Freq: Once | INTRAVENOUS | Status: AC | PRN
  Administered 2021-09-01: 500 [IU]

## 2021-09-01 MED ORDER — FAMOTIDINE 20 MG IN NS 100 ML IVPB
20.0000 mg | Freq: Once | INTRAVENOUS | Status: AC
  Administered 2021-09-01: 20 mg via INTRAVENOUS
  Filled 2021-09-01: qty 100

## 2021-09-01 NOTE — Patient Instructions (Signed)
[facility] ONCOLOGY  Discharge Instructions: Thank you for choosing [facility] to provide your oncology and hematology care.   If you have a lab appointment with the [facility], please go directly to the [facility] and check in at the registration area.   Wear comfortable clothing and clothing appropriate for easy access to any Portacath or PICC line.   We strive to give you quality time with your provider. You may need to reschedule your appointment if you arrive late (15 or more minutes).  Arriving late affects you and other patients whose appointments are after yours.  Also, if you miss three or more appointments without notifying the office, you may be dismissed from the clinic at the provider's discretion.      For prescription refill requests, have your pharmacy contact our office and allow 72 hours for refills to be completed.    Today you received the following chemotherapy and/or immunotherapy agents Taxol and Herceptin      To help prevent nausea and vomiting after your treatment, we encourage you to take your nausea medication as directed.  BELOW ARE SYMPTOMS THAT SHOULD BE REPORTED IMMEDIATELY: *FEVER GREATER THAN 100.4 F (38 C) OR HIGHER *CHILLS OR SWEATING *NAUSEA AND VOMITING THAT IS NOT CONTROLLED WITH YOUR NAUSEA MEDICATION *UNUSUAL SHORTNESS OF BREATH *UNUSUAL BRUISING OR BLEEDING *URINARY PROBLEMS (pain or burning when urinating, or frequent urination) *BOWEL PROBLEMS (unusual diarrhea, constipation, pain near the anus) TENDERNESS IN MOUTH AND THROAT WITH OR WITHOUT PRESENCE OF ULCERS (sore throat, sores in mouth, or a toothache) UNUSUAL RASH, SWELLING OR PAIN  UNUSUAL VAGINAL DISCHARGE OR ITCHING   Items with * indicate a potential emergency and should be followed up as soon as possible or go to the Emergency Department if any problems should occur.  Please show the CHEMOTHERAPY ALERT CARD or IMMUNOTHERAPY ALERT CARD at  check-in to the Emergency Department and triage nurse.  Should you have questions after your visit or need to cancel or reschedule your appointment, please contact [facility]  Dept: [phone]  and follow the prompts.  Office hours are 8:00 a.m. to 4:30 p.m. Monday - Friday. Please note that voicemails left after 4:00 p.m. may not be returned until the following business day.  We are closed weekends and major holidays. You have access to a nurse at all times for urgent questions. Please call the main number to the clinic Dept: [phone] and follow the prompts.   For any non-urgent questions, you may also contact your provider using MyChart. We now offer e-Visits for anyone 31 and older to request care online for non-urgent symptoms. For details visit [email or web address].   Also download the MyChart app! Go to the app store, search "MyChart", open the app, select [place], and log in with your MyChart username and password.  Due to Covid, a mask is required upon entering the hospital/clinic. If you do not have a mask, one will be given to you upon arrival. For doctor visits, patients may have 1 support person aged 2 or older with them. For treatment visits, patients cannot have anyone with them due to current Covid guidelines and our immunocompromised population.

## 2021-09-07 MED FILL — Dexamethasone Sodium Phosphate Inj 100 MG/10ML: INTRAMUSCULAR | Qty: 1 | Status: AC

## 2021-09-07 NOTE — Progress Notes (Signed)
Patient Care Team: Luetta Nutting, DO as PCP - General (Family Medicine) Eppie Gibson, MD as Consulting Physician (Radiation Oncology) Mickeal Skinner Acey Lav, MD as Consulting Physician (Oncology) [facility] (Ophthalmology) Bridget Hartshorn, Noel Journey, MD as Referring Physician (Dermatology) Helayne Seminole, MD as Referring Physician (Otolaryngology) Mauro Kaufmann, RN as Oncology Nurse Navigator Rockwell Germany, RN as Oncology Nurse Navigator  DIAGNOSIS:    ICD-10-CM   1. Malignant neoplasm of upper-outer quadrant of right female breast, unspecified estrogen receptor status ([place])  C50.411       SUMMARY OF ONCOLOGIC HISTORY: Oncology History  Cancer of upper-outer quadrant of female breast ([facility])  2013 Initial Diagnosis   Right breast cancer: Stage Ia ER/PR positive HER2 negative status postlumpectomy, radiation (low risk Oncotype) could not tolerate tamoxifen for more than 30 days.   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 No pathogenic variants identified on the [facility] Multi-Cancer Panel+RNA. VUS in POLD1 identified called c.2429C>T. The report date is 07/01/2021.  The Multi-Cancer Panel + RNA offered by [facility] includes sequencing and/or deletion duplication testing of the following 84 genes: AIP, ALK, APC, ATM, AXIN2,BAP1,  BARD1, BLM, BMPR1A, BRCA1, BRCA2, BRIP1, CASR, CDC73, CDH1, CDK4, CDKN1B, CDKN1C, CDKN2A (p14ARF), CDKN2A (p16INK4a), CEBPA, CHEK2, CTNNA1, DICER1, DIS3L2, EGFR (c.2369C>T, p.Thr790Met variant only), EPCAM (Deletion/duplication testing only), FH, FLCN, GATA2, GPC3, GREM1 (Promoter region deletion/duplication testing only), HOXB13 (c.251G>A, p.Gly84Glu), HRAS, KIT, MAX, MEN1, MET, MITF (c.952G>A, p.Glu318Lys variant only), MLH1, MSH2, MSH3, MSH6, MUTYH, NBN, NF1, NF2, NTHL1, PALB2, PDGFRA, PHOX2B, PMS2, POLD1, POLE, POT1, PRKAR1A, PTCH1, PTEN, RAD50, RAD51C, RAD51D, RB1, RECQL4, RET, RUNX1, SDHAF2, SDHA (sequence changes only), SDHB, SDHC, SDHD, SMAD4, SMARCA4, SMARCB1, SMARCE1, STK11, SUFU, TERC, TERT, TMEM127, TP53, TSC1, TSC2, VHL, WRN and WT1.   07/21/2021 -  Chemotherapy   Patient is on Treatment Plan : BREAST Paclitaxel + Trastuzumab q7d / Trastuzumab q21d       CHIEF COMPLIANT: Day 22, Cycle 2 Taxol Herceptin  INTERVAL HISTORY: [facility] is a 60 y.o. with above-mentioned history of right breast cancer having undergone lumpectomy, currently on chemotherapy with Herceptin. She presents to the clinic today for treatment.   ALLERGIES:  is allergic to lidocaine.  MEDICATIONS:  Current Outpatient Medications  Medication Sig Dispense Refill   ALPRAZolam (XANAX) 0.25 MG tablet Take 1 tablet (0.25 mg total) by mouth 2 (two) times daily as needed for anxiety. 30 tablet 1   amitriptyline (ELAVIL) 25 MG tablet TAKE 1 TABLET BY MOUTH EVERYDAY AT BEDTIME 90 tablet 1   diazepam (VALIUM) 2  MG tablet Take by mouth.     lamoTRIgine (LAMICTAL) 25 MG tablet Take 2 tablets  (50 mg total) by mouth daily. 60 tablet 3   lidocaine-prilocaine (EMLA) cream Apply to affected area once 30 g 3   ondansetron (ZOFRAN) 8 MG tablet Take 1 tablet (8 mg total) by mouth 2 (two) times daily as needed (Nausea or vomiting). 30 tablet 1   predniSONE (DELTASONE) 10 MG tablet Take 1 tablet (10 mg total) by mouth daily with breakfast. 60 tablet 0   prochlorperazine (COMPAZINE) 10 MG tablet Take 1 tablet (10 mg total) by mouth every 6 (six) hours as needed (Nausea or vomiting). 30 tablet 1   No current facility-administered medications for this visit.    PHYSICAL EXAMINATION: ECOG PERFORMANCE STATUS: 1 - Symptomatic but completely ambulatory  Vitals:   09/08/21 0935  BP: 125/79  Pulse: 76  Resp: 17  Temp: 97.7 F (36.5 C)  SpO2: 100%   Filed Weights   09/08/21 0935  Weight: 159 lb 14.4 oz (72.5 kg)    LABORATORY DATA:  I have reviewed the data as listed CMP Latest Ref Rng & Units 09/01/2021 08/25/2021 08/18/2021  Glucose 70 - 99 mg/dL 94 91 94  BUN 6 - 20 mg/dL $Remove'14 10 13  'PCdQrLG$ Creatinine 0.44 - 1.00 mg/dL 0.73 0.70 0.70  Sodium 135 - 145 mmol/L 138 138 139  Potassium 3.5 - 5.1 mmol/L 3.9 3.9 3.8  Chloride 98 - 111 mmol/L 108 107 108  CO2 22 - 32 mmol/L 21(L) 21(L) 23  Calcium 8.9 - 10.3 mg/dL 8.8(L) 8.6(L) 9.0  Total Protein 6.5 - 8.1 g/dL 6.8 6.7 6.6  Total Bilirubin 0.3 - 1.2 mg/dL <0.2(L) 0.3 0.2(L)  Alkaline Phos 38 - 126 U/L 82 83 76  AST 15 - 41 U/L 14(L) 14(L) 13(L)  ALT 0 - 44 U/L $Remo'15 13 14    'BaohC$ Lab Results  Component Value Date   WBC 6.3 09/08/2021   HGB 11.0 (L) 09/08/2021   HCT 33.2 (L) 09/08/2021   MCV 90.2 09/08/2021   PLT 297 09/08/2021   NEUTROABS 4.0 09/08/2021    ASSESSMENT & PLAN:  Cancer of upper-outer quadrant of female breast ([employer]) 06/18/2021:Right lumpectomy: 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  Adjuvant chemotherapy with Taxol and Herceptin followed by Herceptin maintenance 2. discussion regarding pros and cons of reirradiation (Dr. Isidore Moos will need to inform us if she is eligible for radiation given her prior history of radiation therapy to the breast) ----------------------------------------------------------------------------------------------------------------------------------- Current treatment: Cycle 8 Taxol Herceptin Echocardiogram: EF 60 to 65%   Chemo toxicities: Fatigue otherwise tolerated the treatment extremely well. 2. Headaches and fullness in the right side of the head: She responded to prednisone.  3.  Chemotherapy-induced anemia: Today's hemoglobin is 11 4.  Diarrhea: She did not have diarrhea with the last cycle   She would like [place] to discontinue Benadryl when she gets her Herceptin treatments alone Return to clinic weekly for chemo and every other week for follow-up with me  No orders of the defined types were placed in this encounter.  The patient has a good understanding of the overall plan. she agrees with it. she will call with any problems that may  develop before the next visit here.  Total time spent: 30 mins including face to face time and time spent for planning, charting and coordination of care  Rulon Eisenmenger, MD, MPH 09/08/2021  I, Thana Ates, am acting as scribe for Dr. Nicholas Lose.  I have reviewed the above documentation for accuracy and completeness, and I agree with the above.

## 2021-09-08 ENCOUNTER — Inpatient Hospital Stay: Payer: Medicare Other

## 2021-09-08 ENCOUNTER — Other Ambulatory Visit: Payer: Self-pay

## 2021-09-08 ENCOUNTER — Inpatient Hospital Stay (HOSPITAL_BASED_OUTPATIENT_CLINIC_OR_DEPARTMENT_OTHER): Payer: Medicare Other | Admitting: Hematology and Oncology

## 2021-09-08 VITALS — Ht 63.0 in

## 2021-09-08 DIAGNOSIS — Z95828 Presence of other vascular implants and grafts: Secondary | ICD-10-CM

## 2021-09-08 DIAGNOSIS — C50411 Malignant neoplasm of upper-outer quadrant of right female breast: Secondary | ICD-10-CM

## 2021-09-08 DIAGNOSIS — D6481 Anemia due to antineoplastic chemotherapy: Secondary | ICD-10-CM | POA: Diagnosis not present

## 2021-09-08 DIAGNOSIS — R197 Diarrhea, unspecified: Secondary | ICD-10-CM | POA: Diagnosis not present

## 2021-09-08 DIAGNOSIS — R5383 Other fatigue: Secondary | ICD-10-CM | POA: Diagnosis not present

## 2021-09-08 DIAGNOSIS — Z170 Estrogen receptor positive status [ER+]: Secondary | ICD-10-CM | POA: Diagnosis not present

## 2021-09-08 DIAGNOSIS — Z23 Encounter for immunization: Secondary | ICD-10-CM | POA: Diagnosis not present

## 2021-09-08 DIAGNOSIS — R519 Headache, unspecified: Secondary | ICD-10-CM | POA: Diagnosis not present

## 2021-09-08 DIAGNOSIS — Z5111 Encounter for antineoplastic chemotherapy: Secondary | ICD-10-CM | POA: Diagnosis not present

## 2021-09-08 LAB — CMP (CANCER CENTER ONLY)
ALT: 19 U/L (ref 0–44)
AST: 15 U/L (ref 15–41)
Albumin: 3.7 g/dL (ref 3.5–5.0)
Alkaline Phosphatase: 72 U/L (ref 38–126)
Anion gap: 6 (ref 5–15)
BUN: 11 mg/dL (ref 6–20)
CO2: 23 mmol/L (ref 22–32)
Calcium: 8.4 mg/dL — ABNORMAL LOW (ref 8.9–10.3)
Chloride: 110 mmol/L (ref 98–111)
Creatinine: 0.72 mg/dL (ref 0.44–1.00)
GFR, Estimated: >60 mL/min (ref >60)
Glucose, Bld: 86 mg/dL (ref 70–99)
Potassium: 3.8 mmol/L (ref 3.5–5.1)
Sodium: 139 mmol/L (ref 135–145)
Total Bilirubin: <0.2 mg/dL — ABNORMAL LOW (ref 0.3–1.2)
Total Protein: 6.5 g/dL (ref 6.5–8.1)

## 2021-09-08 LAB — CBC WITH DIFFERENTIAL (CANCER CENTER ONLY)
Abs Immature Granulocytes: 0.04 10*3/uL (ref 0.00–0.07)
Basophils Absolute: 0 10*3/uL (ref 0.0–0.1)
Basophils Relative: 1 %
Eosinophils Absolute: 0.1 10*3/uL (ref 0.0–0.5)
Eosinophils Relative: 2 %
HCT: 33.2 % — ABNORMAL LOW (ref 36.0–46.0)
Hemoglobin: 11 g/dL — ABNORMAL LOW (ref 12.0–15.0)
Immature Granulocytes: 1 %
Lymphocytes Relative: 27 %
Lymphs Abs: 1.7 10*3/uL (ref 0.7–4.0)
MCH: 29.9 pg (ref 26.0–34.0)
MCHC: 33.1 g/dL (ref 30.0–36.0)
MCV: 90.2 fL (ref 80.0–100.0)
Monocytes Absolute: 0.4 10*3/uL (ref 0.1–1.0)
Monocytes Relative: 7 %
Neutro Abs: 4 10*3/uL (ref 1.7–7.7)
Neutrophils Relative %: 62 %
Platelet Count: 297 10*3/uL (ref 150–400)
RBC: 3.68 MIL/uL — ABNORMAL LOW (ref 3.87–5.11)
RDW: 14.2 % (ref 11.5–15.5)
WBC Count: 6.3 10*3/uL (ref 4.0–10.5)
nRBC: 0 % (ref 0.0–0.2)

## 2021-09-08 MED ORDER — SODIUM CHLORIDE 0.9 % IV SOLN: Freq: Once | INTRAVENOUS | Status: AC

## 2021-09-08 MED ORDER — SODIUM CHLORIDE 0.9% FLUSH
10.0000 mL | Freq: Once | INTRAVENOUS | Status: AC
  Administered 2021-09-08: 10 mL

## 2021-09-08 MED ORDER — TRASTUZUMAB-ANNS CHEMO 150 MG IV SOLR
150.0000 mg | Freq: Once | INTRAVENOUS | Status: AC
  Administered 2021-09-08: 150 mg via INTRAVENOUS
  Filled 2021-09-08: qty 7.14

## 2021-09-08 MED ORDER — HEPARIN SOD (PORK) LOCK FLUSH 100 UNIT/ML IV SOLN
500.0000 [IU] | Freq: Once | INTRAVENOUS | Status: AC | PRN
  Administered 2021-09-08: 500 [IU]

## 2021-09-08 MED ORDER — SODIUM CHLORIDE 0.9 % IV SOLN
65.0000 mg/m2 | Freq: Once | INTRAVENOUS | Status: AC
  Administered 2021-09-08: 114 mg via INTRAVENOUS
  Filled 2021-09-08: qty 19

## 2021-09-08 MED ORDER — DIPHENHYDRAMINE HCL 50 MG/ML IJ SOLN
50.0000 mg | Freq: Once | INTRAMUSCULAR | Status: AC
  Administered 2021-09-08: 50 mg via INTRAVENOUS
  Filled 2021-09-08: qty 1

## 2021-09-08 MED ORDER — SODIUM CHLORIDE 0.9 % IV SOLN
10.0000 mg | Freq: Once | INTRAVENOUS | Status: AC
  Administered 2021-09-08: 10 mg via INTRAVENOUS
  Filled 2021-09-08: qty 10

## 2021-09-08 MED ORDER — SODIUM CHLORIDE 0.9% FLUSH
10.0000 mL | INTRAVENOUS | Status: DC | PRN
  Administered 2021-09-08: 10 mL

## 2021-09-08 MED ORDER — FAMOTIDINE 20 MG IN NS 100 ML IVPB
20.0000 mg | Freq: Once | INTRAVENOUS | Status: AC
  Administered 2021-09-08: 20 mg via INTRAVENOUS
  Filled 2021-09-08: qty 100

## 2021-09-08 MED ORDER — ACETAMINOPHEN 325 MG PO TABS
650.0000 mg | ORAL_TABLET | Freq: Once | ORAL | Status: AC
  Administered 2021-09-08: 650 mg via ORAL
  Filled 2021-09-08: qty 2

## 2021-09-08 NOTE — Patient Instructions (Signed)
[facility] ONCOLOGY  Discharge Instructions: Thank you for choosing [facility] to provide your oncology and hematology care.   If you have a lab appointment with the [facility], please go directly to the [facility] and check in at the registration area.   Wear comfortable clothing and clothing appropriate for easy access to any Portacath or PICC line.   We strive to give you quality time with your provider. You may need to reschedule your appointment if you arrive late (15 or more minutes).  Arriving late affects you and other patients whose appointments are after yours.  Also, if you miss three or more appointments without notifying the office, you may be dismissed from the clinic at the provider's discretion.      For prescription refill requests, have your pharmacy contact our office and allow 72 hours for refills to be completed.    Today you received the following chemotherapy and/or immunotherapy agents: Trastuzumab (Kanjinti) and Paclitaxel (Taxol).   To help prevent nausea and vomiting after your treatment, we encourage you to take your nausea medication as directed.  BELOW ARE SYMPTOMS THAT SHOULD BE REPORTED IMMEDIATELY: *FEVER GREATER THAN 100.4 F (38 C) OR HIGHER *CHILLS OR SWEATING *NAUSEA AND VOMITING THAT IS NOT CONTROLLED WITH YOUR NAUSEA MEDICATION *UNUSUAL SHORTNESS OF BREATH *UNUSUAL BRUISING OR BLEEDING *URINARY PROBLEMS (pain or burning when urinating, or frequent urination) *BOWEL PROBLEMS (unusual diarrhea, constipation, pain near the anus) TENDERNESS IN MOUTH AND THROAT WITH OR WITHOUT PRESENCE OF ULCERS (sore throat, sores in mouth, or a toothache) UNUSUAL RASH, SWELLING OR PAIN  UNUSUAL VAGINAL DISCHARGE OR ITCHING   Items with * indicate a potential emergency and should be followed up as soon as possible or go to the Emergency Department if any problems should occur.  Please show the CHEMOTHERAPY ALERT CARD or  IMMUNOTHERAPY ALERT CARD at check-in to the Emergency Department and triage nurse.  Should you have questions after your visit or need to cancel or reschedule your appointment, please contact [facility]  Dept: [phone]  and follow the prompts.  Office hours are 8:00 a.m. to 4:30 p.m. Monday - Friday. Please note that voicemails left after 4:00 p.m. may not be returned until the following business day.  We are closed weekends and major holidays. You have access to a nurse at all times for urgent questions. Please call the main number to the clinic Dept: [phone] and follow the prompts.   For any non-urgent questions, you may also contact your provider using MyChart. We now offer e-Visits for anyone 41 and older to request care online for non-urgent symptoms. For details visit [email or web address].   Also download the MyChart app! Go to the app store, search "MyChart", open the app, select [facility], and log in with your MyChart username and password.  Due to Covid, a mask is required upon entering the hospital/clinic. If you do not have a mask, one will be given to you upon arrival. For doctor visits, patients may have 1 support person aged 74 or older with them. For treatment visits, patients cannot have anyone with them due to current Covid guidelines and our immunocompromised population.

## 2021-09-08 NOTE — Assessment & Plan Note (Signed)
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 Isidore Moos will need to informus if she is eligible for radiation given her prior history of radiation therapy to the breast) ----------------------------------------------------------------------------------------------------------------------------------- Current treatment: Cycle8Taxol Herceptin Echocardiogram: EF 60 to 65%  Chemo toxicities: 1. Fatigue otherwise tolerated the treatment extremely well. 2.Headaches and fullness in the right side of the head: She responded to prednisone.  3.Chemotherapy-induced anemia: Today's hemoglobin is 11 4.  Diarrhea: Being monitored   Return to clinicweekly for chemo and every other week for follow-up with me

## 2021-09-15 ENCOUNTER — Encounter: Payer: Self-pay | Admitting: *Deleted

## 2021-09-15 ENCOUNTER — Other Ambulatory Visit: Payer: Self-pay

## 2021-09-15 ENCOUNTER — Inpatient Hospital Stay: Payer: Medicare Other

## 2021-09-15 VITALS — BP 147/82 | HR 68 | Temp 97.9°F | Resp 19 | Wt 158.1 lb

## 2021-09-15 DIAGNOSIS — C50411 Malignant neoplasm of upper-outer quadrant of right female breast: Secondary | ICD-10-CM

## 2021-09-15 DIAGNOSIS — D6481 Anemia due to antineoplastic chemotherapy: Secondary | ICD-10-CM | POA: Diagnosis not present

## 2021-09-15 DIAGNOSIS — Z23 Encounter for immunization: Secondary | ICD-10-CM | POA: Diagnosis not present

## 2021-09-15 DIAGNOSIS — R519 Headache, unspecified: Secondary | ICD-10-CM | POA: Diagnosis not present

## 2021-09-15 DIAGNOSIS — R197 Diarrhea, unspecified: Secondary | ICD-10-CM | POA: Diagnosis not present

## 2021-09-15 DIAGNOSIS — R5383 Other fatigue: Secondary | ICD-10-CM | POA: Diagnosis not present

## 2021-09-15 DIAGNOSIS — Z5111 Encounter for antineoplastic chemotherapy: Secondary | ICD-10-CM | POA: Diagnosis not present

## 2021-09-15 DIAGNOSIS — Z170 Estrogen receptor positive status [ER+]: Secondary | ICD-10-CM | POA: Diagnosis not present

## 2021-09-15 DIAGNOSIS — Z95828 Presence of other vascular implants and grafts: Secondary | ICD-10-CM

## 2021-09-15 LAB — CBC WITH DIFFERENTIAL (CANCER CENTER ONLY)
Abs Immature Granulocytes: 0.02 10*3/uL (ref 0.00–0.07)
Basophils Absolute: 0 10*3/uL (ref 0.0–0.1)
Basophils Relative: 1 %
Eosinophils Absolute: 0.1 10*3/uL (ref 0.0–0.5)
Eosinophils Relative: 2 %
HCT: 32.4 % — ABNORMAL LOW (ref 36.0–46.0)
Hemoglobin: 10.5 g/dL — ABNORMAL LOW (ref 12.0–15.0)
Immature Granulocytes: 0 %
Lymphocytes Relative: 27 %
Lymphs Abs: 1.7 10*3/uL (ref 0.7–4.0)
MCH: 29.6 pg (ref 26.0–34.0)
MCHC: 32.4 g/dL (ref 30.0–36.0)
MCV: 91.3 fL (ref 80.0–100.0)
Monocytes Absolute: 0.5 10*3/uL (ref 0.1–1.0)
Monocytes Relative: 8 %
Neutro Abs: 3.9 10*3/uL (ref 1.7–7.7)
Neutrophils Relative %: 62 %
Platelet Count: 290 10*3/uL (ref 150–400)
RBC: 3.55 MIL/uL — ABNORMAL LOW (ref 3.87–5.11)
RDW: 14 % (ref 11.5–15.5)
WBC Count: 6.2 10*3/uL (ref 4.0–10.5)
nRBC: 0 % (ref 0.0–0.2)

## 2021-09-15 LAB — CMP (CANCER CENTER ONLY)
ALT: 20 U/L (ref 0–44)
AST: 16 U/L (ref 15–41)
Albumin: 3.9 g/dL (ref 3.5–5.0)
Alkaline Phosphatase: 71 U/L (ref 38–126)
Anion gap: 5 (ref 5–15)
BUN: 16 mg/dL (ref 6–20)
CO2: 25 mmol/L (ref 22–32)
Calcium: 8.6 mg/dL — ABNORMAL LOW (ref 8.9–10.3)
Chloride: 106 mmol/L (ref 98–111)
Creatinine: 0.63 mg/dL (ref 0.44–1.00)
GFR, Estimated: >60 mL/min (ref >60)
Glucose, Bld: 94 mg/dL (ref 70–99)
Potassium: 3.7 mmol/L (ref 3.5–5.1)
Sodium: 136 mmol/L (ref 135–145)
Total Bilirubin: 0.6 mg/dL (ref 0.3–1.2)
Total Protein: 6.6 g/dL (ref 6.5–8.1)

## 2021-09-15 MED ORDER — SODIUM CHLORIDE 0.9% FLUSH
10.0000 mL | Freq: Once | INTRAVENOUS | Status: AC
  Administered 2021-09-15: 10 mL

## 2021-09-15 MED ORDER — SODIUM CHLORIDE 0.9 % IV SOLN
10.0000 mg | Freq: Once | INTRAVENOUS | Status: AC
  Administered 2021-09-15: 10 mg via INTRAVENOUS
  Filled 2021-09-15: qty 10

## 2021-09-15 MED ORDER — DIPHENHYDRAMINE HCL 50 MG/ML IJ SOLN
50.0000 mg | Freq: Once | INTRAMUSCULAR | Status: AC
  Administered 2021-09-15: 50 mg via INTRAVENOUS

## 2021-09-15 MED ORDER — ACETAMINOPHEN 325 MG PO TABS
650.0000 mg | ORAL_TABLET | Freq: Once | ORAL | Status: AC
  Administered 2021-09-15: 650 mg via ORAL

## 2021-09-15 MED ORDER — FAMOTIDINE 20 MG IN NS 100 ML IVPB
20.0000 mg | Freq: Once | INTRAVENOUS | Status: AC
  Administered 2021-09-15: 20 mg via INTRAVENOUS

## 2021-09-15 MED ORDER — SODIUM CHLORIDE 0.9 % IV SOLN: Freq: Once | INTRAVENOUS | Status: AC

## 2021-09-15 MED ORDER — SODIUM CHLORIDE 0.9 % IV SOLN
65.0000 mg/m2 | Freq: Once | INTRAVENOUS | Status: AC
  Administered 2021-09-15: 114 mg via INTRAVENOUS
  Filled 2021-09-15: qty 19

## 2021-09-15 MED ORDER — TRASTUZUMAB-ANNS CHEMO 150 MG IV SOLR
150.0000 mg | Freq: Once | INTRAVENOUS | Status: AC
  Administered 2021-09-15: 150 mg via INTRAVENOUS
  Filled 2021-09-15: qty 7.14

## 2021-09-15 NOTE — Patient Instructions (Signed)
[facility] ONCOLOGY  Discharge Instructions: Thank you for choosing [facility] to provide your oncology and hematology care.   If you have a lab appointment with the [facility], please go directly to the [facility] and check in at the registration area.   Wear comfortable clothing and clothing appropriate for easy access to any Portacath or PICC line.   We strive to give you quality time with your provider. You may need to reschedule your appointment if you arrive late (15 or more minutes).  Arriving late affects you and other patients whose appointments are after yours.  Also, if you miss three or more appointments without notifying the office, you may be dismissed from the clinic at the provider's discretion.      For prescription refill requests, have your pharmacy contact our office and allow 72 hours for refills to be completed.    Today you received the following chemotherapy and/or immunotherapy agents Kanjinti and Taxol      To help prevent nausea and vomiting after your treatment, we encourage you to take your nausea medication as directed.  BELOW ARE SYMPTOMS THAT SHOULD BE REPORTED IMMEDIATELY: *FEVER GREATER THAN 100.4 F (38 C) OR HIGHER *CHILLS OR SWEATING *NAUSEA AND VOMITING THAT IS NOT CONTROLLED WITH YOUR NAUSEA MEDICATION *UNUSUAL SHORTNESS OF BREATH *UNUSUAL BRUISING OR BLEEDING *URINARY PROBLEMS (pain or burning when urinating, or frequent urination) *BOWEL PROBLEMS (unusual diarrhea, constipation, pain near the anus) TENDERNESS IN MOUTH AND THROAT WITH OR WITHOUT PRESENCE OF ULCERS (sore throat, sores in mouth, or a toothache) UNUSUAL RASH, SWELLING OR PAIN  UNUSUAL VAGINAL DISCHARGE OR ITCHING   Items with * indicate a potential emergency and should be followed up as soon as possible or go to the Emergency Department if any problems should occur.  Please show the CHEMOTHERAPY ALERT CARD or IMMUNOTHERAPY ALERT CARD at  check-in to the Emergency Department and triage nurse.  Should you have questions after your visit or need to cancel or reschedule your appointment, please contact [facility]  Dept: [phone]  and follow the prompts.  Office hours are 8:00 a.m. to 4:30 p.m. Monday - Friday. Please note that voicemails left after 4:00 p.m. may not be returned until the following business day.  We are closed weekends and major holidays. You have access to a nurse at all times for urgent questions. Please call the main number to the clinic Dept: [phone] and follow the prompts.   For any non-urgent questions, you may also contact your provider using MyChart. We now offer e-Visits for anyone 79 and older to request care online for non-urgent symptoms. For details visit [email or web address].   Also download the MyChart app! Go to the app store, search "MyChart", open the app, select [facility], and log in with your MyChart username and password.  Due to Covid, a mask is required upon entering the hospital/clinic. If you do not have a mask, one will be given to you upon arrival. For doctor visits, patients may have 1 support person aged 5 or older with them. For treatment visits, patients cannot have anyone with them due to current Covid guidelines and our immunocompromised population.

## 2021-09-16 ENCOUNTER — Telehealth: Payer: Self-pay | Admitting: Radiation Oncology

## 2021-09-21 ENCOUNTER — Telehealth: Payer: Self-pay | Admitting: Hematology and Oncology

## 2021-09-21 NOTE — Telephone Encounter (Signed)
Patient called in to rescheduled infusion for the same day as lab and provider appointment. Patient is aware that infusion appointment is 2 hours after provider visit.

## 2021-09-22 MED FILL — Dexamethasone Sodium Phosphate Inj 100 MG/10ML: INTRAMUSCULAR | Qty: 1 | Status: AC

## 2021-09-23 ENCOUNTER — Inpatient Hospital Stay: Payer: Medicare Other

## 2021-09-23 ENCOUNTER — Other Ambulatory Visit: Payer: Self-pay

## 2021-09-23 ENCOUNTER — Inpatient Hospital Stay: Payer: Medicare Other | Attending: Internal Medicine

## 2021-09-23 VITALS — BP 126/73 | HR 72 | Temp 98.4°F | Resp 18 | Wt 162.5 lb

## 2021-09-23 DIAGNOSIS — Z170 Estrogen receptor positive status [ER+]: Secondary | ICD-10-CM | POA: Insufficient documentation

## 2021-09-23 DIAGNOSIS — C50411 Malignant neoplasm of upper-outer quadrant of right female breast: Secondary | ICD-10-CM | POA: Insufficient documentation

## 2021-09-23 DIAGNOSIS — R519 Headache, unspecified: Secondary | ICD-10-CM | POA: Diagnosis not present

## 2021-09-23 DIAGNOSIS — Z923 Personal history of irradiation: Secondary | ICD-10-CM | POA: Diagnosis not present

## 2021-09-23 DIAGNOSIS — Z5111 Encounter for antineoplastic chemotherapy: Secondary | ICD-10-CM | POA: Diagnosis present

## 2021-09-23 DIAGNOSIS — T451X5A Adverse effect of antineoplastic and immunosuppressive drugs, initial encounter: Secondary | ICD-10-CM | POA: Diagnosis not present

## 2021-09-23 DIAGNOSIS — Z79899 Other long term (current) drug therapy: Secondary | ICD-10-CM | POA: Insufficient documentation

## 2021-09-23 DIAGNOSIS — Z5112 Encounter for antineoplastic immunotherapy: Secondary | ICD-10-CM | POA: Diagnosis present

## 2021-09-23 DIAGNOSIS — Z95828 Presence of other vascular implants and grafts: Secondary | ICD-10-CM

## 2021-09-23 DIAGNOSIS — D6481 Anemia due to antineoplastic chemotherapy: Secondary | ICD-10-CM | POA: Diagnosis not present

## 2021-09-23 DIAGNOSIS — R110 Nausea: Secondary | ICD-10-CM | POA: Diagnosis not present

## 2021-09-23 LAB — CBC WITH DIFFERENTIAL (CANCER CENTER ONLY)
Abs Immature Granulocytes: 0.02 10*3/uL (ref 0.00–0.07)
Basophils Absolute: 0 10*3/uL (ref 0.0–0.1)
Basophils Relative: 0 %
Eosinophils Absolute: 0.1 10*3/uL (ref 0.0–0.5)
Eosinophils Relative: 2 %
HCT: 32.9 % — ABNORMAL LOW (ref 36.0–46.0)
Hemoglobin: 10.6 g/dL — ABNORMAL LOW (ref 12.0–15.0)
Immature Granulocytes: 0 %
Lymphocytes Relative: 30 %
Lymphs Abs: 2 10*3/uL (ref 0.7–4.0)
MCH: 29.5 pg (ref 26.0–34.0)
MCHC: 32.2 g/dL (ref 30.0–36.0)
MCV: 91.6 fL (ref 80.0–100.0)
Monocytes Absolute: 0.5 10*3/uL (ref 0.1–1.0)
Monocytes Relative: 8 %
Neutro Abs: 4 10*3/uL (ref 1.7–7.7)
Neutrophils Relative %: 60 %
Platelet Count: 346 10*3/uL (ref 150–400)
RBC: 3.59 MIL/uL — ABNORMAL LOW (ref 3.87–5.11)
RDW: 14.4 % (ref 11.5–15.5)
WBC Count: 6.7 10*3/uL (ref 4.0–10.5)
nRBC: 0 % (ref 0.0–0.2)

## 2021-09-23 LAB — CMP (CANCER CENTER ONLY)
ALT: 20 U/L (ref 0–44)
AST: 18 U/L (ref 15–41)
Albumin: 3.6 g/dL (ref 3.5–5.0)
Alkaline Phosphatase: 79 U/L (ref 38–126)
Anion gap: 8 (ref 5–15)
BUN: 12 mg/dL (ref 6–20)
CO2: 22 mmol/L (ref 22–32)
Calcium: 8.5 mg/dL — ABNORMAL LOW (ref 8.9–10.3)
Chloride: 109 mmol/L (ref 98–111)
Creatinine: 0.72 mg/dL (ref 0.44–1.00)
GFR, Estimated: >60 mL/min (ref >60)
Glucose, Bld: 87 mg/dL (ref 70–99)
Potassium: 3.9 mmol/L (ref 3.5–5.1)
Sodium: 139 mmol/L (ref 135–145)
Total Bilirubin: 0.2 mg/dL — ABNORMAL LOW (ref 0.3–1.2)
Total Protein: 6.4 g/dL — ABNORMAL LOW (ref 6.5–8.1)

## 2021-09-23 MED ORDER — ACETAMINOPHEN 325 MG PO TABS
650.0000 mg | ORAL_TABLET | Freq: Once | ORAL | Status: AC
  Administered 2021-09-23: 650 mg via ORAL
  Filled 2021-09-23: qty 2

## 2021-09-23 MED ORDER — SODIUM CHLORIDE 0.9 % IV SOLN: Freq: Once | INTRAVENOUS | Status: AC

## 2021-09-23 MED ORDER — DIPHENHYDRAMINE HCL 50 MG/ML IJ SOLN
50.0000 mg | Freq: Once | INTRAMUSCULAR | Status: AC
  Administered 2021-09-23: 50 mg via INTRAVENOUS
  Filled 2021-09-23: qty 1

## 2021-09-23 MED ORDER — SODIUM CHLORIDE 0.9 % IV SOLN
65.0000 mg/m2 | Freq: Once | INTRAVENOUS | Status: AC
  Administered 2021-09-23: 114 mg via INTRAVENOUS
  Filled 2021-09-23: qty 19

## 2021-09-23 MED ORDER — FAMOTIDINE 20 MG IN NS 100 ML IVPB
20.0000 mg | Freq: Once | INTRAVENOUS | Status: AC
  Administered 2021-09-23: 20 mg via INTRAVENOUS
  Filled 2021-09-23: qty 100

## 2021-09-23 MED ORDER — SODIUM CHLORIDE 0.9% FLUSH
10.0000 mL | Freq: Once | INTRAVENOUS | Status: AC
  Administered 2021-09-23: 10 mL

## 2021-09-23 MED ORDER — SODIUM CHLORIDE 0.9 % IV SOLN
10.0000 mg | Freq: Once | INTRAVENOUS | Status: AC
  Administered 2021-09-23: 10 mg via INTRAVENOUS
  Filled 2021-09-23: qty 10

## 2021-09-23 MED ORDER — HEPARIN SOD (PORK) LOCK FLUSH 100 UNIT/ML IV SOLN
500.0000 [IU] | Freq: Once | INTRAVENOUS | Status: AC | PRN
  Administered 2021-09-23: 500 [IU]

## 2021-09-23 MED ORDER — TRASTUZUMAB-ANNS CHEMO 150 MG IV SOLR
150.0000 mg | Freq: Once | INTRAVENOUS | Status: AC
  Administered 2021-09-23: 150 mg via INTRAVENOUS
  Filled 2021-09-23: qty 7.14

## 2021-09-23 MED ORDER — SODIUM CHLORIDE 0.9% FLUSH
10.0000 mL | INTRAVENOUS | Status: DC | PRN
  Administered 2021-09-23: 10 mL

## 2021-09-23 NOTE — Patient Instructions (Signed)
[facility] ONCOLOGY  Discharge Instructions: Thank you for choosing [facility] to provide your oncology and hematology care.   If you have a lab appointment with the [facility], please go directly to the [facility] and check in at the registration area.   Wear comfortable clothing and clothing appropriate for easy access to any Portacath or PICC line.   We strive to give you quality time with your provider. You may need to reschedule your appointment if you arrive late (15 or more minutes).  Arriving late affects you and other patients whose appointments are after yours.  Also, if you miss three or more appointments without notifying the office, you may be dismissed from the clinic at the provider's discretion.      For prescription refill requests, have your pharmacy contact our office and allow 72 hours for refills to be completed.    Today you received the following chemotherapy and/or immunotherapy agents Trastuzumab and Taxol      To help prevent nausea and vomiting after your treatment, we encourage you to take your nausea medication as directed.  BELOW ARE SYMPTOMS THAT SHOULD BE REPORTED IMMEDIATELY: *FEVER GREATER THAN 100.4 F (38 C) OR HIGHER *CHILLS OR SWEATING *NAUSEA AND VOMITING THAT IS NOT CONTROLLED WITH YOUR NAUSEA MEDICATION *UNUSUAL SHORTNESS OF BREATH *UNUSUAL BRUISING OR BLEEDING *URINARY PROBLEMS (pain or burning when urinating, or frequent urination) *BOWEL PROBLEMS (unusual diarrhea, constipation, pain near the anus) TENDERNESS IN MOUTH AND THROAT WITH OR WITHOUT PRESENCE OF ULCERS (sore throat, sores in mouth, or a toothache) UNUSUAL RASH, SWELLING OR PAIN  UNUSUAL VAGINAL DISCHARGE OR ITCHING   Items with * indicate a potential emergency and should be followed up as soon as possible or go to the Emergency Department if any problems should occur.  Please show the CHEMOTHERAPY ALERT CARD or IMMUNOTHERAPY ALERT CARD at  check-in to the Emergency Department and triage nurse.  Should you have questions after your visit or need to cancel or reschedule your appointment, please contact [facility]  Dept: [phone]  and follow the prompts.  Office hours are 8:00 a.m. to 4:30 p.m. Monday - Friday. Please note that voicemails left after 4:00 p.m. may not be returned until the following business day.  We are closed weekends and major holidays. You have access to a nurse at all times for urgent questions. Please call the main number to the clinic Dept: [phone] and follow the prompts.   For any non-urgent questions, you may also contact your provider using MyChart. We now offer e-Visits for anyone 36 and older to request care online for non-urgent symptoms. For details visit [email or web address].   Also download the MyChart app! Go to the app store, search "MyChart", open the app, select [facility], and log in with your MyChart username and password.  Due to Covid, a mask is required upon entering the hospital/clinic. If you do not have a mask, one will be given to you upon arrival. For doctor visits, patients may have 1 support person aged 61 or older with them. For treatment visits, patients cannot have anyone with them due to current Covid guidelines and our immunocompromised population.

## 2021-09-28 MED FILL — Dexamethasone Sodium Phosphate Inj 100 MG/10ML: INTRAMUSCULAR | Qty: 1 | Status: AC

## 2021-09-29 ENCOUNTER — Encounter: Payer: Self-pay | Admitting: Adult Health

## 2021-09-29 ENCOUNTER — Inpatient Hospital Stay (HOSPITAL_BASED_OUTPATIENT_CLINIC_OR_DEPARTMENT_OTHER): Payer: Medicare Other | Admitting: Adult Health

## 2021-09-29 ENCOUNTER — Inpatient Hospital Stay: Payer: Medicare Other

## 2021-09-29 ENCOUNTER — Other Ambulatory Visit: Payer: Self-pay

## 2021-09-29 DIAGNOSIS — C50411 Malignant neoplasm of upper-outer quadrant of right female breast: Secondary | ICD-10-CM

## 2021-09-29 DIAGNOSIS — Z95828 Presence of other vascular implants and grafts: Secondary | ICD-10-CM

## 2021-09-29 DIAGNOSIS — Z5112 Encounter for antineoplastic immunotherapy: Secondary | ICD-10-CM | POA: Diagnosis not present

## 2021-09-29 LAB — CMP (CANCER CENTER ONLY)
ALT: 18 U/L (ref 0–44)
AST: 16 U/L (ref 15–41)
Albumin: 3.7 g/dL (ref 3.5–5.0)
Alkaline Phosphatase: 77 U/L (ref 38–126)
Anion gap: 9 (ref 5–15)
BUN: 14 mg/dL (ref 6–20)
CO2: 23 mmol/L (ref 22–32)
Calcium: 8.8 mg/dL — ABNORMAL LOW (ref 8.9–10.3)
Chloride: 108 mmol/L (ref 98–111)
Creatinine: 0.7 mg/dL (ref 0.44–1.00)
GFR, Estimated: >60 mL/min (ref >60)
Glucose, Bld: 89 mg/dL (ref 70–99)
Potassium: 3.9 mmol/L (ref 3.5–5.1)
Sodium: 140 mmol/L (ref 135–145)
Total Bilirubin: 0.3 mg/dL (ref 0.3–1.2)
Total Protein: 6.7 g/dL (ref 6.5–8.1)

## 2021-09-29 LAB — CBC WITH DIFFERENTIAL (CANCER CENTER ONLY)
Abs Immature Granulocytes: 0.04 10*3/uL (ref 0.00–0.07)
Basophils Absolute: 0 10*3/uL (ref 0.0–0.1)
Basophils Relative: 1 %
Eosinophils Absolute: 0.1 10*3/uL (ref 0.0–0.5)
Eosinophils Relative: 1 %
HCT: 32.6 % — ABNORMAL LOW (ref 36.0–46.0)
Hemoglobin: 10.7 g/dL — ABNORMAL LOW (ref 12.0–15.0)
Immature Granulocytes: 1 %
Lymphocytes Relative: 23 %
Lymphs Abs: 1.6 10*3/uL (ref 0.7–4.0)
MCH: 29.6 pg (ref 26.0–34.0)
MCHC: 32.8 g/dL (ref 30.0–36.0)
MCV: 90.1 fL (ref 80.0–100.0)
Monocytes Absolute: 0.5 10*3/uL (ref 0.1–1.0)
Monocytes Relative: 7 %
Neutro Abs: 4.9 10*3/uL (ref 1.7–7.7)
Neutrophils Relative %: 67 %
Platelet Count: 323 10*3/uL (ref 150–400)
RBC: 3.62 MIL/uL — ABNORMAL LOW (ref 3.87–5.11)
RDW: 14.3 % (ref 11.5–15.5)
WBC Count: 7.1 10*3/uL (ref 4.0–10.5)
nRBC: 0 % (ref 0.0–0.2)

## 2021-09-29 MED ORDER — TRASTUZUMAB-ANNS CHEMO 150 MG IV SOLR
150.0000 mg | Freq: Once | INTRAVENOUS | Status: AC
  Administered 2021-09-29: 150 mg via INTRAVENOUS
  Filled 2021-09-29: qty 7.14

## 2021-09-29 MED ORDER — DIPHENHYDRAMINE HCL 50 MG/ML IJ SOLN
50.0000 mg | Freq: Once | INTRAMUSCULAR | Status: AC
Start: 2021-09-29 — End: 2021-09-29
  Administered 2021-09-29: 50 mg via INTRAVENOUS
  Filled 2021-09-29: qty 1

## 2021-09-29 MED ORDER — DEXAMETHASONE SODIUM PHOSPHATE 100 MG/10ML IJ SOLN
10.0000 mg | Freq: Once | INTRAMUSCULAR | Status: AC
  Administered 2021-09-29: 10 mg via INTRAVENOUS
  Filled 2021-09-29: qty 10

## 2021-09-29 MED ORDER — FAMOTIDINE 20 MG IN NS 100 ML IVPB
20.0000 mg | Freq: Once | INTRAVENOUS | Status: AC
  Administered 2021-09-29: 20 mg via INTRAVENOUS
  Filled 2021-09-29: qty 100

## 2021-09-29 MED ORDER — SODIUM CHLORIDE 0.9% FLUSH
10.0000 mL | Freq: Once | INTRAVENOUS | Status: AC
  Administered 2021-09-29: 10 mL

## 2021-09-29 MED ORDER — ACETAMINOPHEN 325 MG PO TABS
650.0000 mg | ORAL_TABLET | Freq: Once | ORAL | Status: AC
  Administered 2021-09-29: 650 mg via ORAL
  Filled 2021-09-29: qty 2

## 2021-09-29 MED ORDER — SODIUM CHLORIDE 0.9 % IV SOLN
65.0000 mg/m2 | Freq: Once | INTRAVENOUS | Status: AC
  Administered 2021-09-29: 114 mg via INTRAVENOUS
  Filled 2021-09-29: qty 19

## 2021-09-29 MED ORDER — HEPARIN SOD (PORK) LOCK FLUSH 100 UNIT/ML IV SOLN
500.0000 [IU] | Freq: Once | INTRAVENOUS | Status: AC | PRN
  Administered 2021-09-29: 500 [IU]

## 2021-09-29 MED ORDER — SODIUM CHLORIDE 0.9 % IV SOLN: Freq: Once | INTRAVENOUS | Status: AC

## 2021-09-29 MED ORDER — SODIUM CHLORIDE 0.9% FLUSH
10.0000 mL | INTRAVENOUS | Status: DC | PRN
  Administered 2021-09-29: 10 mL

## 2021-09-29 NOTE — Progress Notes (Signed)
[facility] Follow up:    Lori Nutting, DO [address] [place] [address]   DIAGNOSIS:  Cancer Staging  Cancer of upper-outer quadrant of female breast [facility]) Staging form: Breast, AJCC 7th Edition - Pathologic: No stage assigned - Unsigned Specimen type: Core Needle Biopsy Laterality: Right - Clinical stage from 05/26/2021: Stage Unknown (T1c, NX, cM0) - Signed by Eppie Gibson, MD on 05/28/2021 Specimen type: Core Needle Biopsy Stage prefix: Initial diagnosis Laterality: Right Tumor grade (Scarff-Bloom-Richardson system): G3 Estrogen receptor status: Negative Progesterone receptor status: Negative HER2 status: Positive Staging comments: Staged at Breast Cancer Conference 12/08/11    SUMMARY OF ONCOLOGIC HISTORY: Oncology History  Cancer of upper-outer quadrant of female breast ([facility])  2013 Initial Diagnosis   Right breast cancer: Stage Ia ER/PR positive HER2 negative status postlumpectomy, radiation (low risk Oncotype) could not tolerate tamoxifen for more than 30 days.   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  Biopsy revealed grade 3 IDC ER/PR negative HER2 positive with a Ki-67 of 30%   05/26/2021 Cancer Staging   Staging form: Breast, AJCC 7th Edition - Clinical stage from 05/26/2021: Stage Unknown (T1c, NX, cM0) - Signed by Eppie Gibson, MD on 05/28/2021 Specimen type: Core Needle Biopsy Stage prefix: Initial diagnosis Laterality: Right Tumor grade (Scarff-Bloom-Richardson system): G3 Estrogen receptor status: Negative Progesterone receptor status: Negative HER2 status: Positive Staging comments: Staged at Breast Cancer Conference 12/08/11     06/18/2021 Surgery   Right lumpectomy: Grade 3 IDC 1.5 cm,  high-grade DCIS, margins negative, 0/2 lymph nodes negative, ER 0%, PR 0%, HER2 3+ positive, Ki-67 30%    Genetic Testing   Negative genetic testing. No pathogenic variants identified on the [facility] Multi-Cancer Panel+RNA. VUS in POLD1 identified called c.2429C>T. The report date is 07/01/2021.  The Multi-Cancer Panel + RNA offered by [facility] includes sequencing and/or deletion duplication testing of the following 84 genes: AIP, ALK, APC, ATM, AXIN2,BAP1,  BARD1, BLM, BMPR1A, BRCA1, BRCA2, BRIP1, CASR, CDC73, CDH1, CDK4, CDKN1B, CDKN1C, CDKN2A (p14ARF), CDKN2A (p16INK4a), CEBPA, CHEK2, CTNNA1, DICER1, DIS3L2, EGFR (c.2369C>T, p.Thr790Met variant only), EPCAM (Deletion/duplication testing only), FH, FLCN, GATA2, GPC3, GREM1 (Promoter region deletion/duplication testing only), HOXB13 (c.251G>A, p.Gly84Glu), HRAS, KIT, MAX, MEN1, MET, MITF (c.952G>A, p.Glu318Lys variant only), MLH1, MSH2, MSH3, MSH6, MUTYH, NBN, NF1, NF2, NTHL1, PALB2, PDGFRA, PHOX2B, PMS2, POLD1, POLE, POT1, PRKAR1A, PTCH1, PTEN, RAD50, RAD51C, RAD51D, RB1, RECQL4, RET, RUNX1, SDHAF2, SDHA (sequence changes only), SDHB, SDHC, SDHD, SMAD4, SMARCA4, SMARCB1, SMARCE1, STK11, SUFU, TERC, TERT, TMEM127, TP53, TSC1, TSC2, VHL, WRN and WT1.   07/21/2021 -  Chemotherapy   Patient is on Treatment Plan : BREAST Paclitaxel + Trastuzumab q7d / Trastuzumab q21d       CURRENT THERAPY: Taxol/Herceptin week 11  INTERVAL HISTORY: Lori Mcmillan 60 y.o. female returns for follow-up and evaluation prior to receiving Lori Mcmillan weekly Taxol and Herceptin.  Lori Mcmillan notes that Lori Mcmillan has some nausea and also is very irritable when Lori Mcmillan is coming down off of the steroids.  Lori Mcmillan is also fatigued.  Otherwise Lori Mcmillan is tolerating the treatment well.  Lori Mcmillan completely denies peripheral neuropathy.  Lori Mcmillan most recent echo on 9/28 showed an EF of 60-65%.  Lori Mcmillan would like to know when Lori Mcmillan next echo  was going to be repeated.  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  No sign of residual dz at 04/2018 rad onc f/u and 10/2018 neuro-onc f/u. 03/2019 MRI brain->no resid/no recurrence.   Brain embolism and thrombosis    Breast cancer ([facility])    Rt breast; 1.5 cm low grade invasive ductal carcinoma status post lumpectomy with sentinel node biopsy on 01/04/2012.   Chicken pox    Depression    zoloft in past--?wt gain.   Diplopia    chronic (meningioma-related)   Eustachian tube dysfunction 09/05/2013   Family history of breast cancer    Family history of colon cancer    Family history of melanoma    Family history of ovarian cancer    Family history of pancreatic cancer    History of radiation therapy 02/24/12-04/11/12   right breast/ 45Gy$RemoveBeforeDEI'@1'ciftjyjiqSLwrZTx$ .8Gyx76fx/boost=16Gy$RemoveBef'@2'xqkiYVTHGR$  Gya34fx.  Latest mammo and u/s 03/2013--normal.   History of radiation therapy 11/30/17- 01/11/18   Right temporal lobe treated to 55.8 Gy with 31 fx of 1.8 Gy   Hx of basal cell carcinoma    Migraine    Palpitations    has taken Metoprolol for palpitations in the past.   Seizure ([facility])    Taste impairment    s/p radiation therapy   Tobacco dependence 09/05/2013    SURGICAL HISTORY: Past  Surgical History:  Procedure Laterality Date   APPENDECTOMY  2008   emergency   BRAIN TUMOR EXCISION  2018   Meningioma   BREAST LUMPECTOMY  01/04/12   right breast   BREAST LUMPECTOMY WITH RADIOACTIVE SEED AND SENTINEL LYMPH NODE BIOPSY Right 06/18/2021   Procedure: RIGHT BREAST LUMPECTOMY WITH RADIOACTIVE SEED AND SENTINEL LYMPH NODE BIOPSY;  Surgeon: Erroll Luna, MD;  Location: [facility];  Service: General;  Laterality: Right;   CRANIECTOMY  10/19/2017   at [facility] Right 06/18/2021   Procedure: INSERTION PORT-A-CATH;  Surgeon: Erroll Luna, MD;  Location: [facility];  Service: General;  Laterality: Right;   WISDOM TOOTH EXTRACTION  1990    SOCIAL HISTORY: Social History   Socioeconomic History   Marital status: Single    Spouse name: Not on file   Number of children: Not on file   Years of education: Not on file   Highest education level: Not on file  Occupational History   Not on file  Tobacco Use   Smoking status: Former    Packs/day: 0.50    Types: Cigarettes    Quit date: 10/17/2017    Years since quitting: 3.9   Smokeless tobacco: Never  Vaping Use  Vaping Use: Never used  Substance and Sexual Activity   Alcohol use: Yes    Alcohol/week: 3.0 standard drinks    Types: 3 Cans of beer per week    Comment: 3-4 beers/day   Drug use: Not Currently   Sexual activity: Not Currently    Partners: Male  Other Topics Concern   Not on file  Social History Narrative   Marital status/children/pets: Single.  No children.  Lives alone.  Has pets.Orig from [place] in [place].   Education/employment: Bachelor's degree, retired   Engineer, materials:      -smoke alarm in the home:Yes     - wears seatbelt: Yes               Social Determinants of Health   Financial Resource Strain: Not on file  Food Insecurity: Not on file  Transportation Needs: Not on file  Physical Activity: Not on file  Stress: Not on file  Social  Connections: Not on file  Intimate Partner Violence: Not on file    FAMILY HISTORY: Family History  Problem Relation Age of Onset   Anesthesia problems Mother    Breast cancer Mother 38       lumpectomy, chemo, radiation   Ovarian cancer Maternal Aunt 80   Cancer Maternal Aunt        unknown type   Breast cancer Maternal Aunt 80   Breast cancer Maternal Aunt 40       bilateral   Pancreatic cancer Maternal Uncle        dx 67s   Stomach cancer Paternal Uncle    Cancer Paternal Uncle        unknown type   Lung cancer Paternal Uncle        hx smoking   Dementia Maternal Grandmother    Lung cancer Maternal Grandfather        hx smoking   Pancreatic cancer Paternal Grandmother 18   Melanoma Paternal Grandmother        dx >50, shin   Heart Problems Paternal Grandfather    Colon cancer Cousin 66       maternal first cousin   Breast cancer Cousin        dx 44s, paternal first cousin    Review of Systems  Constitutional:  Positive for fatigue. Negative for appetite change, chills, fever and unexpected weight change.  HENT:   Negative for hearing loss, lump/mass and trouble swallowing.   Eyes:  Negative for eye problems and icterus.  Respiratory:  Negative for chest tightness, cough and shortness of breath.   Cardiovascular:  Negative for chest pain, leg swelling and palpitations.  Gastrointestinal:  Positive for nausea. Negative for abdominal distention, abdominal pain, constipation, diarrhea and vomiting.  Endocrine: Negative for hot flashes.  Genitourinary:  Negative for difficulty urinating.   Musculoskeletal:  Negative for arthralgias.  Skin:  Negative for itching and rash.  Neurological:  Negative for dizziness, extremity weakness, headaches and numbness.  Hematological:  Negative for adenopathy. Does not bruise/bleed easily.  Psychiatric/Behavioral:  Negative for depression. The patient is not nervous/anxious.      PHYSICAL EXAMINATION  ECOG PERFORMANCE STATUS: 1 -  Symptomatic but completely ambulatory  Vitals:   09/29/21 1120  BP: 127/77  Pulse: (!) 51  Resp: 18  Temp: 98 F (36.7 C)  SpO2: 97%    Physical Exam Constitutional:      General: Lori Mcmillan is not in acute distress.    Appearance: Normal appearance. Lori Mcmillan is not toxic-appearing.  HENT:     Head: Normocephalic and atraumatic.  Eyes:     General: No scleral icterus. Cardiovascular:     Rate and Rhythm: Normal rate and regular rhythm.     Pulses: Normal pulses.     Heart sounds: Normal heart sounds.  Pulmonary:     Effort: Pulmonary effort is normal.     Breath sounds: Normal breath sounds.  Abdominal:     General: Abdomen is flat. Bowel sounds are normal. There is no distension.     Palpations: Abdomen is soft.     Tenderness: There is no abdominal tenderness.  Musculoskeletal:        General: No swelling.     Cervical back: Neck supple.  Lymphadenopathy:     Cervical: No cervical adenopathy.  Skin:    General: Skin is warm and dry.     Findings: No rash.  Neurological:     General: No focal deficit present.     Mental Status: Lori Mcmillan is alert.  Psychiatric:        Mood and Affect: Mood normal.        Behavior: Behavior normal.    LABORATORY DATA:  CBC    Component Value Date/Time   WBC 7.1 09/29/2021 1039   WBC 8.3 06/17/2021 0935   RBC 3.62 (L) 09/29/2021 1039   HGB 10.7 (L) 09/29/2021 1039   HGB 11.8 12/08/2011 0819   HCT 32.6 (L) 09/29/2021 1039   HCT 34.9 12/08/2011 0819   PLT 323 09/29/2021 1039   PLT 314 12/08/2011 0819   MCV 90.1 09/29/2021 1039   MCV 88.3 12/08/2011 0819   MCH 29.6 09/29/2021 1039   MCHC 32.8 09/29/2021 1039   RDW 14.3 09/29/2021 1039   RDW 14.0 12/08/2011 0819   LYMPHSABS 1.6 09/29/2021 1039   LYMPHSABS 2.2 12/08/2011 0819   MONOABS 0.5 09/29/2021 1039   MONOABS 0.8 12/08/2011 0819   EOSABS 0.1 09/29/2021 1039   EOSABS 0.2 12/08/2011 0819   BASOSABS 0.0 09/29/2021 1039   BASOSABS 0.1 12/08/2011 0819    CMP     Component Value  Date/Time   NA 140 09/29/2021 1039   NA 140 11/23/2019 0000   K 3.9 09/29/2021 1039   CL 108 09/29/2021 1039   CO2 23 09/29/2021 1039   GLUCOSE 89 09/29/2021 1039   BUN 14 09/29/2021 1039   BUN 9 11/23/2019 0000   CREATININE 0.70 09/29/2021 1039   CREATININE 0.64 09/18/2013 0900   CALCIUM 8.8 (L) 09/29/2021 1039   PROT 6.7 09/29/2021 1039   ALBUMIN 3.7 09/29/2021 1039   AST 16 09/29/2021 1039   ALT 18 09/29/2021 1039   ALKPHOS 77 09/29/2021 1039   BILITOT 0.3 09/29/2021 1039   GFRNONAA >60 09/29/2021 1039   GFRAA 113 11/23/2019 0000     ASSESSMENT and THERAPY PLAN:   Cancer of upper-outer quadrant of female breast ([facility]) 06/18/2021:Right lumpectomy: 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  Adjuvant chemotherapy with Taxol and Herceptin followed by Herceptin maintenance 2. discussion regarding pros and cons of reirradiation (Dr. Isidore Moos will need to inform us if Lori Mcmillan is eligible for radiation given Lori Mcmillan prior history of radiation therapy to the breast) ----------------------------------------------------------------------------------------------------------------------------------- Current treatment: Cycle 11 Taxol  Herceptin Echocardiogram: EF 60 to 65% on September 26.   Chemo toxicities: Fatigue otherwise tolerated the treatment extremely well. 2. Headaches and fullness in the right side of the head: Lori Mcmillan responded to prednisone.  3.  Chemotherapy-induced anemia: Today's hemoglobin is 11 4.    Nausea: Controlled with antinausea medication. 5.  Irritability: This is secondary to the steroids, and should resolve once Lori Mcmillan completes chemotherapy next week.  Lori Mcmillan and I discussed Lori Mcmillan overall treatment plan.   Lori Mcmillan will proceed with treatment today.  Lori Mcmillan is due for repeat echocardiogram and I have placed orders for this today.  I have asked my nurse to get this scheduled.  We have adjusted Lori Mcmillan appointments as there were a couple extra mis-scheduled.  Lori Mcmillan will return next week for labs and treatment and then in 4 weeks for labs follow-up with Dr. Payton Mccallum and Herceptin alone.  Lori Mcmillan also has an appointment that day with Dr. Camelia Eng to discuss adjuvant radiation.     All questions were answered. The patient knows to call the clinic with any problems, questions or concerns. We can certainly see the patient much sooner if necessary.  Total encounter time: 20 minutes in face-to-face visit time, chart review, lab review, care coordination, and documentation of the encounter.  Wilber Bihari, NP 09/29/21 11:42 AM Medical Oncology and Hematology [facility] [place], [place] [address] Tel. [phone]    Fax. [phone]  *Total Encounter Time as defined by the Centers for Medicare and Medicaid Services includes, in addition to the face-to-face time of a patient visit (documented in the note above) non-face-to-face time: obtaining and reviewing outside history, ordering and reviewing medications, tests or procedures, care coordination (communications with other health care professionals or caregivers) and documentation in the medical record.

## 2021-09-29 NOTE — Assessment & Plan Note (Addendum)
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 Isidore Moos will need to informus if she is eligible for radiation given her prior history of radiation therapy to the breast) ----------------------------------------------------------------------------------------------------------------------------------- Current treatment: Cycle11Taxol Herceptin Echocardiogram: EF 60 to 65% on September 26.  Chemo toxicities: 1. Fatigue otherwise tolerated the treatment extremely well. 2.Headaches and fullness in the right side of the head: She responded to prednisone.  3.Chemotherapy-induced anemia: Today's hemoglobin is 11 4.    Nausea: Controlled with antinausea medication. 5.  Irritability: This is secondary to the steroids, and should resolve once she completes chemotherapy next week.  Levada Dy and I discussed her overall treatment plan.  She will proceed with treatment today.  She is due for repeat echocardiogram and I have placed orders for this today.  I have asked my nurse to get this scheduled.  We have adjusted her appointments as there were a couple extra mis-scheduled.  Dalisha will return next week for labs and treatment and then in 4 weeks for labs follow-up with Dr. Payton Mccallum and Herceptin alone.  She also has an appointment that day with Dr. Camelia Eng to discuss adjuvant radiation.

## 2021-09-29 NOTE — Patient Instructions (Signed)
[facility] ONCOLOGY  Discharge Instructions: Thank you for choosing [facility] to provide your oncology and hematology care.   If you have a lab appointment with the [facility], please go directly to the [facility] and check in at the registration area.   Wear comfortable clothing and clothing appropriate for easy access to any Portacath or PICC line.   We strive to give you quality time with your provider. You may need to reschedule your appointment if you arrive late (15 or more minutes).  Arriving late affects you and other patients whose appointments are after yours.  Also, if you miss three or more appointments without notifying the office, you may be dismissed from the clinic at the providers discretion.      For prescription refill requests, have your pharmacy contact our office and allow 72 hours for refills to be completed.    Today you received the following chemotherapy and/or immunotherapy agents: Trastuzumab ([facility]) and Paclitaxel (Taxol).    To help prevent nausea and vomiting after your treatment, we encourage you to take your nausea medication as directed.  BELOW ARE SYMPTOMS THAT SHOULD BE REPORTED IMMEDIATELY: *FEVER GREATER THAN 100.4 F (38 C) OR HIGHER *CHILLS OR SWEATING *NAUSEA AND VOMITING THAT IS NOT CONTROLLED WITH YOUR NAUSEA MEDICATION *UNUSUAL SHORTNESS OF BREATH *UNUSUAL BRUISING OR BLEEDING *URINARY PROBLEMS (pain or burning when urinating, or frequent urination) *BOWEL PROBLEMS (unusual diarrhea, constipation, pain near the anus) TENDERNESS IN MOUTH AND THROAT WITH OR WITHOUT PRESENCE OF ULCERS (sore throat, sores in mouth, or a toothache) UNUSUAL RASH, SWELLING OR PAIN  UNUSUAL VAGINAL DISCHARGE OR ITCHING   Items with * indicate a potential emergency and should be followed up as soon as possible or go to the Emergency Department if any problems should occur.  Please show the CHEMOTHERAPY ALERT CARD or  IMMUNOTHERAPY ALERT CARD at check-in to the Emergency Department and triage nurse.  Should you have questions after your visit or need to cancel or reschedule your appointment, please contact [facility]  Dept: [phone]  and follow the prompts.  Office hours are 8:00 a.m. to 4:30 p.m. Monday - Friday. Please note that voicemails left after 4:00 p.m. may not be returned until the following business day.  We are closed weekends and major holidays. You have access to a nurse at all times for urgent questions. Please call the main number to the clinic Dept: [phone] and follow the prompts.   For any non-urgent questions, you may also contact your provider using MyChart. We now offer e-Visits for anyone 59 and older to request care online for non-urgent symptoms. For details visit [email or web address].   Also download the MyChart app! Go to the app store, search "MyChart", open the app, select [facility], and log in with your MyChart username and password.  Due to Covid, a mask is required upon entering the hospital/clinic. If you do not have a mask, one will be given to you upon arrival. For doctor visits, patients may have 1 support person aged 53 or older with them. For treatment visits, patients cannot have anyone with them due to current Covid guidelines and our immunocompromised population.

## 2021-09-30 ENCOUNTER — Inpatient Hospital Stay: Payer: Medicare Other

## 2021-09-30 ENCOUNTER — Inpatient Hospital Stay: Payer: Medicare Other | Admitting: Adult Health

## 2021-10-02 ENCOUNTER — Encounter: Payer: Self-pay | Admitting: Adult Health

## 2021-10-02 ENCOUNTER — Telehealth: Payer: Self-pay

## 2021-10-02 NOTE — Telephone Encounter (Signed)
See mychart messages

## 2021-10-06 ENCOUNTER — Inpatient Hospital Stay: Payer: Medicare Other

## 2021-10-06 ENCOUNTER — Encounter: Payer: Self-pay | Admitting: *Deleted

## 2021-10-06 ENCOUNTER — Other Ambulatory Visit: Payer: Self-pay

## 2021-10-06 VITALS — BP 123/75 | HR 79 | Temp 97.7°F | Resp 18 | Wt 158.2 lb

## 2021-10-06 DIAGNOSIS — Z5112 Encounter for antineoplastic immunotherapy: Secondary | ICD-10-CM | POA: Diagnosis not present

## 2021-10-06 DIAGNOSIS — C50411 Malignant neoplasm of upper-outer quadrant of right female breast: Secondary | ICD-10-CM

## 2021-10-06 DIAGNOSIS — Z95828 Presence of other vascular implants and grafts: Secondary | ICD-10-CM

## 2021-10-06 LAB — CMP (CANCER CENTER ONLY)
ALT: 26 U/L (ref 0–44)
AST: 19 U/L (ref 15–41)
Albumin: 4 g/dL (ref 3.5–5.0)
Alkaline Phosphatase: 77 U/L (ref 38–126)
Anion gap: 6 (ref 5–15)
BUN: 12 mg/dL (ref 6–20)
CO2: 25 mmol/L (ref 22–32)
Calcium: 8.8 mg/dL — ABNORMAL LOW (ref 8.9–10.3)
Chloride: 106 mmol/L (ref 98–111)
Creatinine: 0.65 mg/dL (ref 0.44–1.00)
GFR, Estimated: >60 mL/min (ref >60)
Glucose, Bld: 89 mg/dL (ref 70–99)
Potassium: 4.1 mmol/L (ref 3.5–5.1)
Sodium: 137 mmol/L (ref 135–145)
Total Bilirubin: 0.3 mg/dL (ref 0.3–1.2)
Total Protein: 6.6 g/dL (ref 6.5–8.1)

## 2021-10-06 LAB — CBC WITH DIFFERENTIAL (CANCER CENTER ONLY)
Abs Immature Granulocytes: 0.04 10*3/uL (ref 0.00–0.07)
Basophils Absolute: 0 10*3/uL (ref 0.0–0.1)
Basophils Relative: 1 %
Eosinophils Absolute: 0.2 10*3/uL (ref 0.0–0.5)
Eosinophils Relative: 2 %
HCT: 32.4 % — ABNORMAL LOW (ref 36.0–46.0)
Hemoglobin: 10.8 g/dL — ABNORMAL LOW (ref 12.0–15.0)
Immature Granulocytes: 1 %
Lymphocytes Relative: 23 %
Lymphs Abs: 1.6 10*3/uL (ref 0.7–4.0)
MCH: 30 pg (ref 26.0–34.0)
MCHC: 33.3 g/dL (ref 30.0–36.0)
MCV: 90 fL (ref 80.0–100.0)
Monocytes Absolute: 0.5 10*3/uL (ref 0.1–1.0)
Monocytes Relative: 7 %
Neutro Abs: 4.8 10*3/uL (ref 1.7–7.7)
Neutrophils Relative %: 66 %
Platelet Count: 334 10*3/uL (ref 150–400)
RBC: 3.6 MIL/uL — ABNORMAL LOW (ref 3.87–5.11)
RDW: 14.2 % (ref 11.5–15.5)
WBC Count: 7.1 10*3/uL (ref 4.0–10.5)
nRBC: 0 % (ref 0.0–0.2)

## 2021-10-06 MED ORDER — SODIUM CHLORIDE 0.9 % IV SOLN
65.0000 mg/m2 | Freq: Once | INTRAVENOUS | Status: AC
  Administered 2021-10-06: 12:00:00 114 mg via INTRAVENOUS
  Filled 2021-10-06: qty 19

## 2021-10-06 MED ORDER — SODIUM CHLORIDE 0.9% FLUSH
10.0000 mL | Freq: Once | INTRAVENOUS | Status: AC
Start: 2021-10-06 — End: 2021-10-06
  Administered 2021-10-06: 09:00:00 10 mL

## 2021-10-06 MED ORDER — SODIUM CHLORIDE 0.9 % IV SOLN
10.0000 mg | Freq: Once | INTRAVENOUS | Status: AC
  Administered 2021-10-06: 11:00:00 10 mg via INTRAVENOUS
  Filled 2021-10-06: qty 10

## 2021-10-06 MED ORDER — SODIUM CHLORIDE 0.9% FLUSH
10.0000 mL | INTRAVENOUS | Status: DC | PRN
  Administered 2021-10-06: 13:00:00 10 mL

## 2021-10-06 MED ORDER — DIPHENHYDRAMINE HCL 50 MG/ML IJ SOLN
50.0000 mg | Freq: Once | INTRAMUSCULAR | Status: AC
Start: 2021-10-06 — End: 2021-10-06
  Administered 2021-10-06: 10:00:00 50 mg via INTRAVENOUS
  Filled 2021-10-06: qty 1

## 2021-10-06 MED ORDER — ACETAMINOPHEN 325 MG PO TABS
650.0000 mg | ORAL_TABLET | Freq: Once | ORAL | Status: AC
  Administered 2021-10-06: 10:00:00 650 mg via ORAL
  Filled 2021-10-06: qty 2

## 2021-10-06 MED ORDER — TRASTUZUMAB-ANNS CHEMO 150 MG IV SOLR
6.0000 mg/kg | Freq: Once | INTRAVENOUS | Status: AC
  Administered 2021-10-06: 12:00:00 441 mg via INTRAVENOUS
  Filled 2021-10-06: qty 21

## 2021-10-06 MED ORDER — HEPARIN SOD (PORK) LOCK FLUSH 100 UNIT/ML IV SOLN
500.0000 [IU] | Freq: Once | INTRAVENOUS | Status: AC | PRN
  Administered 2021-10-06: 13:00:00 500 [IU]

## 2021-10-06 MED ORDER — SODIUM CHLORIDE 0.9 % IV SOLN: Freq: Once | INTRAVENOUS | Status: AC

## 2021-10-06 MED ORDER — FAMOTIDINE 20 MG IN NS 100 ML IVPB
20.0000 mg | Freq: Once | INTRAVENOUS | Status: AC
  Administered 2021-10-06: 10:00:00 20 mg via INTRAVENOUS
  Filled 2021-10-06: qty 100

## 2021-10-06 NOTE — Patient Instructions (Signed)
[facility]COL[facility]  Discharge Instructions: Thank you for choosing [facility] to provide your oncology and hematology care.   If you have a lab appointment with the [facility], please go directly to the [facility] and check in at the registration area.   Wear comfortable clothing and clothing appropriate for easy access to any Portacath or PICC line.   We strive to give you quality time with your provider. You may need to reschedule your appointment if you arrive late (15 or more minutes).  Arriving late affects you and other patients whose appointments are after yours.  Also, if you miss three or more appointments without notifying the office, you may be dismissed from the clinic at the providers discretion.      For prescription refill requests, have your pharmacy contact our office and allow 72 hours for refills to be completed.    Today you received the following chemotherapy and/or immunotherapy agents Herceptin and Taxol      To help prevent nausea and vomiting after your treatment, we encourage you to take your nausea medication as directed.  BELOW ARE SYMPTOMS THAT SHOULD BE REPORTED IMMEDIATELY: *FEVER GREATER THAN 100.4 F (38 C) OR HIGHER *CHILLS OR SWEATING *NAUSEA AND VOMITING THAT IS NOT CONTROLLED WITH YOUR NAUSEA MEDICATION *UNUSUAL SHORTNESS OF BREATH *UNUSUAL BRUISING OR BLEEDING *URINARY PROBLEMS (pain or burning when urinating, or frequent urination) *BOWEL PROBLEMS (unusual diarrhea, constipation, pain near the anus) TENDERNESS IN MOUTH AND THROAT WITH OR WITHOUT PRESENCE OF ULCERS (sore throat, sores in mouth, or a toothache) UNUSUAL RASH, SWELLING OR PAIN  UNUSUAL VAGINAL DISCHARGE OR ITCHING   Items with * indicate a potential emergency and should be followed up as soon as possible or go to the Emergency Department if any problems should occur.  Please show the CHEMOTHERAPY ALERT CARD or IMMUNOTHERAPY ALERT CARD at  check-in to the Emergency Department and triage nurse.  Should you have questions after your visit or need to cancel or reschedule your appointment, please contact [facility]  Dept: [phone]  and follow the prompts.  Office hours are 8:00 a.m. to 4:30 p.m. Monday - Friday. Please note that voicemails left after 4:00 p.m. may not be returned until the following business day.  We are closed weekends and major holidays. You have access to a nurse at all times for urgent questions. Please call the main number to the clinic Dept: [phone] and follow the prompts.   For any non-urgent questions, you may also contact your provider using MyChart. We now offer e-Visits for anyone 62 and older to request care online for non-urgent symptoms. For details visit [email or web address].   Also download the MyChart app! Go to the app store, search "MyChart", open the app, select [facility], and log in with your MyChart username and password.  Due to Covid, a mask is required upon entering the hospital/clinic. If you do not have a mask, one will be given to you upon arrival. For doctor visits, patients may have 1 support person aged 66 or older with them. For treatment visits, patients cannot have anyone with them due to current Covid guidelines and our immunocompromised population.

## 2021-10-07 ENCOUNTER — Inpatient Hospital Stay: Payer: Medicare Other

## 2021-10-14 ENCOUNTER — Inpatient Hospital Stay: Payer: Medicare Other

## 2021-10-19 ENCOUNTER — Ambulatory Visit (HOSPITAL_COMMUNITY)
Admission: RE | Admit: 2021-10-19 | Discharge: 2021-10-19 | Disposition: A | Discharge status: Home / Self Care | Payer: Medicare Other | Source: Ambulatory Visit | Attending: Adult Health | Admitting: Adult Health

## 2021-10-19 ENCOUNTER — Encounter: Payer: Self-pay | Admitting: Hematology and Oncology

## 2021-10-19 DIAGNOSIS — Z01818 Encounter for other preprocedural examination: Secondary | ICD-10-CM | POA: Insufficient documentation

## 2021-10-19 DIAGNOSIS — I340 Nonrheumatic mitral (valve) insufficiency: Secondary | ICD-10-CM | POA: Insufficient documentation

## 2021-10-19 DIAGNOSIS — C50411 Malignant neoplasm of upper-outer quadrant of right female breast: Secondary | ICD-10-CM | POA: Diagnosis not present

## 2021-10-19 DIAGNOSIS — Z0189 Encounter for other specified special examinations: Secondary | ICD-10-CM | POA: Diagnosis not present

## 2021-10-20 ENCOUNTER — Ambulatory Visit: Payer: Medicare Other | Admitting: Adult Health

## 2021-10-20 ENCOUNTER — Encounter: Payer: Self-pay | Admitting: Hematology and Oncology

## 2021-10-20 ENCOUNTER — Ambulatory Visit: Payer: Medicare Other

## 2021-10-20 ENCOUNTER — Other Ambulatory Visit: Payer: Medicare Other

## 2021-10-20 LAB — ECHOCARDIOGRAM COMPLETE
AR max vel: 2.05 cm2
AV Peak grad: 7.6 mmHg
Ao pk vel: 1.38 m/s
Area-P 1/2: 3.7 cm2
Calc EF: 53.6 %
S' Lateral: 3.3 cm
Single Plane A2C EF: 52.6 %
Single Plane A4C EF: 52 %

## 2021-10-21 ENCOUNTER — Ambulatory Visit: Payer: Medicare Other

## 2021-10-21 ENCOUNTER — Other Ambulatory Visit: Payer: Medicare Other

## 2021-10-21 ENCOUNTER — Ambulatory Visit: Payer: Medicare Other | Admitting: Hematology and Oncology

## 2021-10-27 ENCOUNTER — Inpatient Hospital Stay: Payer: Medicare Other

## 2021-10-27 ENCOUNTER — Other Ambulatory Visit: Payer: Self-pay

## 2021-10-27 ENCOUNTER — Inpatient Hospital Stay (HOSPITAL_BASED_OUTPATIENT_CLINIC_OR_DEPARTMENT_OTHER): Payer: Medicare Other | Admitting: Hematology and Oncology

## 2021-10-27 DIAGNOSIS — Z171 Estrogen receptor negative status [ER-]: Secondary | ICD-10-CM | POA: Insufficient documentation

## 2021-10-27 DIAGNOSIS — D6481 Anemia due to antineoplastic chemotherapy: Secondary | ICD-10-CM | POA: Insufficient documentation

## 2021-10-27 DIAGNOSIS — C50411 Malignant neoplasm of upper-outer quadrant of right female breast: Secondary | ICD-10-CM

## 2021-10-27 DIAGNOSIS — Z5112 Encounter for antineoplastic immunotherapy: Secondary | ICD-10-CM | POA: Insufficient documentation

## 2021-10-27 DIAGNOSIS — Z510 Encounter for antineoplastic radiation therapy: Secondary | ICD-10-CM | POA: Insufficient documentation

## 2021-10-27 DIAGNOSIS — Z95828 Presence of other vascular implants and grafts: Secondary | ICD-10-CM

## 2021-10-27 LAB — CBC WITH DIFFERENTIAL (CANCER CENTER ONLY)
Abs Immature Granulocytes: 0.02 10*3/uL (ref 0.00–0.07)
Basophils Absolute: 0.1 10*3/uL (ref 0.0–0.1)
Basophils Relative: 1 %
Eosinophils Absolute: 0.2 10*3/uL (ref 0.0–0.5)
Eosinophils Relative: 2 %
HCT: 34.4 % — ABNORMAL LOW (ref 36.0–46.0)
Hemoglobin: 11.1 g/dL — ABNORMAL LOW (ref 12.0–15.0)
Immature Granulocytes: 0 %
Lymphocytes Relative: 22 %
Lymphs Abs: 2 10*3/uL (ref 0.7–4.0)
MCH: 29.7 pg (ref 26.0–34.0)
MCHC: 32.3 g/dL (ref 30.0–36.0)
MCV: 92 fL (ref 80.0–100.0)
Monocytes Absolute: 0.7 10*3/uL (ref 0.1–1.0)
Monocytes Relative: 8 %
Neutro Abs: 6.4 10*3/uL (ref 1.7–7.7)
Neutrophils Relative %: 67 %
Platelet Count: 316 10*3/uL (ref 150–400)
RBC: 3.74 MIL/uL — ABNORMAL LOW (ref 3.87–5.11)
RDW: 14.1 % (ref 11.5–15.5)
WBC Count: 9.5 10*3/uL (ref 4.0–10.5)
nRBC: 0 % (ref 0.0–0.2)

## 2021-10-27 LAB — CMP (CANCER CENTER ONLY)
ALT: 18 U/L (ref 0–44)
AST: 15 U/L (ref 15–41)
Albumin: 4.1 g/dL (ref 3.5–5.0)
Alkaline Phosphatase: 79 U/L (ref 38–126)
Anion gap: 7 (ref 5–15)
BUN: 15 mg/dL (ref 6–20)
CO2: 25 mmol/L (ref 22–32)
Calcium: 8.8 mg/dL — ABNORMAL LOW (ref 8.9–10.3)
Chloride: 105 mmol/L (ref 98–111)
Creatinine: 0.66 mg/dL (ref 0.44–1.00)
GFR, Estimated: >60 mL/min (ref >60)
Glucose, Bld: 92 mg/dL (ref 70–99)
Potassium: 3.9 mmol/L (ref 3.5–5.1)
Sodium: 137 mmol/L (ref 135–145)
Total Bilirubin: 0.3 mg/dL (ref 0.3–1.2)
Total Protein: 6.7 g/dL (ref 6.5–8.1)

## 2021-10-27 MED ORDER — SODIUM CHLORIDE 0.9 % IV SOLN: Freq: Once | INTRAVENOUS | Status: AC

## 2021-10-27 MED ORDER — HEPARIN SOD (PORK) LOCK FLUSH 100 UNIT/ML IV SOLN
500.0000 [IU] | Freq: Once | INTRAVENOUS | Status: AC | PRN
  Administered 2021-10-27: 500 [IU]

## 2021-10-27 MED ORDER — ACETAMINOPHEN 325 MG PO TABS
650.0000 mg | ORAL_TABLET | Freq: Once | ORAL | Status: AC
  Administered 2021-10-27: 650 mg via ORAL
  Filled 2021-10-27: qty 2

## 2021-10-27 MED ORDER — SODIUM CHLORIDE 0.9% FLUSH
10.0000 mL | Freq: Once | INTRAVENOUS | Status: AC
  Administered 2021-10-27: 10 mL

## 2021-10-27 MED ORDER — TRASTUZUMAB-ANNS CHEMO 150 MG IV SOLR
6.0000 mg/kg | Freq: Once | INTRAVENOUS | Status: AC
  Administered 2021-10-27: 441 mg via INTRAVENOUS
  Filled 2021-10-27: qty 21

## 2021-10-27 MED ORDER — SODIUM CHLORIDE 0.9% FLUSH
10.0000 mL | INTRAVENOUS | Status: DC | PRN
  Administered 2021-10-27: 10 mL

## 2021-10-27 MED ORDER — DIPHENHYDRAMINE HCL 25 MG PO CAPS
50.0000 mg | ORAL_CAPSULE | Freq: Once | ORAL | Status: AC
  Administered 2021-10-27: 50 mg via ORAL
  Filled 2021-10-27: qty 2

## 2021-10-27 NOTE — Patient Instructions (Addendum)
[facility]  Discharge Instructions: ?Thank you for choosing [facility] to provide your oncology and hematology care.  ? ?If you have a lab appointment with the [facility], please go directly to the [facility] and check in at the registration area. ?  ?Wear comfortable clothing and clothing appropriate for easy access to any Portacath or PICC line.  ? ?We strive to give you quality time with your provider. You may need to reschedule your appointment if you arrive late (15 or more minutes).  Arriving late affects you and other patients whose appointments are after yours.  Also, if you miss three or more appointments without notifying the office, you may be dismissed from the clinic at the provider?s discretion.    ?  ?For prescription refill requests, have your pharmacy contact our office and allow 72 hours for refills to be completed.   ? ?Today you received the following chemotherapy and/or immunotherapy agents: Trastuzumab    ?  ?To help prevent nausea and vomiting after your treatment, we encourage you to take your nausea medication as directed. ? ?BELOW ARE SYMPTOMS THAT SHOULD BE REPORTED IMMEDIATELY: ?*FEVER GREATER THAN 100.4 F (38 ?C) OR HIGHER ?*CHILLS OR SWEATING ?*NAUSEA AND VOMITING THAT IS NOT CONTROLLED WITH YOUR NAUSEA MEDICATION ?*UNUSUAL SHORTNESS OF BREATH ?*UNUSUAL BRUISING OR BLEEDING ?*URINARY PROBLEMS (pain or burning when urinating, or frequent urination) ?*BOWEL PROBLEMS (unusual diarrhea, constipation, pain near the anus) ?TENDERNESS IN MOUTH AND THROAT WITH OR WITHOUT PRESENCE OF ULCERS (sore throat, sores in mouth, or a toothache) ?UNUSUAL RASH, SWELLING OR PAIN  ?UNUSUAL VAGINAL DISCHARGE OR ITCHING  ? ?Items with * indicate a potential emergency and should be followed up as soon as possible or go to the Emergency Department if any problems should occur. ? ?Please show the CHEMOTHERAPY ALERT CARD or IMMUNOTHERAPY ALERT CARD at check-in  to the Emergency Department and triage nurse. ? ?Should you have questions after your visit or need to cancel or reschedule your appointment, please contact [facility]  Dept: [phone]  and follow the prompts.  Office hours are 8:00 a.m. to 4:30 p.m. Monday - Friday. Please note that voicemails left after 4:00 p.m. may not be returned until the following business day.  We are closed weekends and major holidays. You have access to a nurse at all times for urgent questions. Please call the main number to the clinic Dept: [phone] and follow the prompts. ? ? ?For any non-urgent questions, you may also contact your provider using MyChart. We now offer e-Visits for anyone 18 and older to request care online for non-urgent symptoms. For details visit [email or web address]. ?  ?Also download the MyChart app! Go to the app store, search "MyChart", open the app, select [facility], and log in with your MyChart username and password. ? ?Due to Covid, a mask is required upon entering the hospital/clinic. If you do not have a mask, one will be given to you upon arrival. For doctor visits, patients may have 1 support person aged 18 or older with them. For treatment visits, patients cannot have anyone with them due to current Covid guidelines and our immunocompromised population.  ? ?

## 2021-10-27 NOTE — Assessment & Plan Note (Signed)
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 Isidore Moos will need to informus if she is eligible for radiation given her prior history of radiation therapy to the breast) ----------------------------------------------------------------------------------------------------------------------------------- Current treatment: Cycle10Taxol Herceptin Echocardiogram: EF 60 to 65%  Chemo toxicities: 1. Fatigue otherwise tolerated the treatment extremely well. 2.Headaches and fullness in the right side of the head: She responded to prednisone.  3.Chemotherapy-induced anemia: Today's hemoglobin is 11 4.Diarrhea: She did not have diarrhea with the last cycle  Return to clinicweekly for chemo and every other week for follow

## 2021-10-27 NOTE — Progress Notes (Signed)
Radiation Oncology         ([phone]) [phone] ________________________________  Name: [facility] MRN: [id number]  Date: 10/28/2021  DOB: October 27, 1960  Follow-Up Visit Note by telephone.  The patient opted for telemedicine to maximize safety during the pandemic.  MyChart video was not obtainable.  Outpatient  CC: Lori Nutting, DO  Lori Lose, MD  Diagnosis:      ICD-10-CM   1. Malignant neoplasm of upper-outer quadrant of right breast in female, estrogen receptor negative ([facility])  C50.411    Z17.1        Cancer Staging  Cancer of upper-outer quadrant of female breast [facility]) Staging form: Breast, AJCC 7th Edition - Pathologic: No Mcmillan assigned - Unsigned Specimen type: Core Needle Biopsy Laterality: Right - Clinical Mcmillan from 05/26/2021: Mcmillan Unknown (T1c, NX, cM0) - Signed by Eppie Gibson, MD on 05/28/2021 Specimen type: Core Needle Biopsy Mcmillan prefix: Initial diagnosis Laterality: Right Tumor grade (Scarff-Bloom-Richardson system): G3 Estrogen receptor status: Negative Progesterone receptor status: Negative HER2 status: Positive Staging comments: Staged at Breast Cancer Conference 12/08/11   pT1c, pN0  CHIEF COMPLAINT: Here to discuss management of right breast cancer  Narrative:  The patient returns today for follow-up.  I had referred her to Dr. Ronal [facility] at [facility] for a second opinion (rad/onc) and she reports this not materialize (she was called w/ an appt w/ med onc instead, and she declined it).   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 No pathogenic variants were detected otherwise.  The patient opted to proceed with right breast lumpectomy and nodal biopsies on 06/18/21 under the care of Dr. Brantley Mcmillan.  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  (Pt also underwent port placement on the date of her lumpectomy procedure).  Systemic therapy, if applicable, involved (dates and therapy as follows): the patient completed 12 cycles of adjuvant chemotherapy consisting of Taxol and Herceptin on 07/21/21 through 10/06/21 under the care of Dr. [name]. On 10/27/21, the patient began Herceptin maintenance which will be continued for 1 year. Per her most recent follow up with Dr. [name] on 10/27/21, the patient reported chemo toxicities including fatigue, generalized weakness, poor appetite, and weight gain issues. The patient was also noted to have chemo induced anemia, headaches, and diarrhea while undergoing treatment.  No further imaging has been performed since the patient was last seen in August.  Symptomatically, the patient reports: fatigue Lymphedema issues, if any:  Patient denies, but does report difficulty with ROM to her right arm from where surgical site is in her axilla. Denies any PT assessment/evaluation since her surgery  Pain issues, if any: Reports discomfort/soreness to her right breast and axilla, particularly when she tries to do any activity with that arm.         ALLERGIES:  has no active allergies.  Meds: Current Outpatient Medications  Medication Sig Dispense Refill   ALPRAZolam (XANAX) 0.25 MG tablet Take 1 tablet (0.25 mg total) by mouth 2 (two) times daily as needed for anxiety.  30 tablet 1   amitriptyline (ELAVIL) 25 MG tablet TAKE 1 TABLET BY MOUTH EVERYDAY AT BEDTIME 90 tablet 1   diazepam (VALIUM) 2 MG tablet Take by mouth.     lamoTRIgine (LAMICTAL) 25 MG  tablet Take 2 tablets (50 mg total) by mouth daily. 60 tablet 3   lidocaine-prilocaine (EMLA) cream Apply to affected area once 30 g 3   ondansetron (ZOFRAN) 8 MG tablet Take 1 tablet (8 mg total) by mouth 2 (two) times daily as needed (Nausea or vomiting). 30 tablet 1   predniSONE (DELTASONE) 10 MG tablet Take 1 tablet (10 mg total) by mouth daily with breakfast. 60 tablet 0   prochlorperazine (COMPAZINE) 10 MG tablet Take 1 tablet (10 mg total) by mouth every 6 (six) hours as needed (Nausea or vomiting). 30 tablet 1   No current facility-administered medications for this encounter.    Physical Findings:  vitals were not taken for this visit. .     General: Alert and oriented, in no acute distress    Lab Findings: Lab Results  Component Value Date   WBC 9.5 10/27/2021   HGB 11.1 (L) 10/27/2021   HCT 34.4 (L) 10/27/2021   MCV 92.0 10/27/2021   PLT 316 10/27/2021    '@LASTCHEMISTRY'$ @  Radiographic Findings: ECHOCARDIOGRAM COMPLETE  Result Date: 10/20/2021    ECHOCARDIOGRAM REPORT   Patient Name:   [facility] Date of Exam: 10/19/2021 Medical Rec #:  [id number]          Height:       63.0 in Accession #:    [id number]         Weight:       158.2 lb Date of Birth:  [birth year]          BSA:          1.750 m Patient Age:    34 years           BP:           123/75 mmHg Patient Gender: F                  HR:           68 bpm. Exam Location:  Outpatient Procedure: 2D Echo, Cardiac Doppler, Color Doppler and Strain Analysis Indications:    Chemo  History:        Patient has prior history of Echocardiogram examinations.  Sonographer:    Jyl Heinz Referring Phys: [place]  1. Left ventricular ejection fraction, by estimation, is 50 to 55%. Left ventricular ejection fraction by 3D volume is 52 %. The left ventricle has low normal function. The left ventricle has no regional wall motion abnormalities. Left ventricular diastolic parameters were normal. The average  left ventricular global longitudinal strain is -17.7 %. The global longitudinal strain is normal.  2. Right ventricular systolic function is normal. The right ventricular size is normal.  3. The mitral valve is normal in structure. Mild mitral valve regurgitation.  4. The aortic valve is normal in structure. Aortic valve regurgitation is trivial. No aortic stenosis is present. FINDINGS  Left Ventricle: Compared to previous echo in Sept, 2022, the LV function is slightly lower ( GLS and EF). Left ventricular ejection fraction, by estimation, is 50 to 55%. Left ventricular ejection fraction by 3D volume is 52 %. The left ventricle has low normal function. The left ventricle has no regional wall motion abnormalities. The average left ventricular global longitudinal strain  is -17.7 %. The global longitudinal strain is normal. The left ventricular internal cavity size was normal in size.  There is no left ventricular hypertrophy. Left ventricular diastolic parameters were normal. Right Ventricle: The right ventricular size is normal. Right vetricular wall thickness was not well visualized. Right ventricular systolic function is normal. Left Atrium: Left atrial size was normal in size. Right Atrium: Right atrial size was normal in size. Pericardium: There is no evidence of pericardial effusion. Mitral Valve: The mitral valve is normal in structure. Mild mitral valve regurgitation. Tricuspid Valve: The tricuspid valve is normal in structure. Tricuspid valve regurgitation is not demonstrated. Aortic Valve: The aortic valve is normal in structure. Aortic valve regurgitation is trivial. No aortic stenosis is present. Aortic valve peak gradient measures 7.6 mmHg. Pulmonic Valve: The pulmonic valve was grossly normal. Pulmonic valve regurgitation is trivial. Aorta: The aortic root and ascending aorta are structurally normal, with no evidence of dilitation. IAS/Shunts: The atrial septum is grossly normal.  LEFT VENTRICLE PLAX 2D  LVIDd:         4.30 cm         Diastology LVIDs:         3.30 cm         LV e' medial:    7.94 cm/s LV PW:         1.10 cm         LV E/e' medial:  9.5 LV IVS:        1.10 cm         LV e' lateral:   7.18 cm/s LVOT diam:     2.00 cm         LV E/e' lateral: 10.5 LV SV:         63 LV SV Index:   36              2D LVOT Area:     3.14 cm        Longitudinal                                Strain                                2D Strain GLS  -17.7 % LV Volumes (MOD)               Avg: LV vol d, MOD    125.0 ml A2C:                           3D Volume EF LV vol d, MOD    74.1 ml       LV 3D EF:    Left A4C:                                        ventricul LV vol s, MOD    59.3 ml                    ar A2C:                                        ejection LV  vol s, MOD    35.6 ml                    fraction A4C:                                        by 3D LV SV MOD A2C:   65.7 ml                    volume is LV SV MOD A4C:   74.1 ml                    52 %. LV SV MOD BP:    53.7 ml                                 3D Volume EF:                                3D EF:        52 %                                LV EDV:       125 ml                                LV ESV:       60 ml                                LV SV:        65 ml RIGHT VENTRICLE            IVC RV Basal diam:  3.20 cm    IVC diam: 1.60 cm RV Mid diam:    2.90 cm RV S prime:     9.79 cm/s TAPSE (M-mode): 2.2 cm LEFT ATRIUM             Index        RIGHT ATRIUM           Index LA diam:        3.40 cm 1.94 cm/m   RA Area:     10.20 cm LA Vol (A2C):   43.7 ml 24.96 ml/m  RA Volume:   20.40 ml  11.65 ml/m LA Vol (A4C):   29.4 ml 16.80 ml/m LA Biplane Vol: 36.2 ml 20.68 ml/m  AORTIC VALVE AV Area (Vmax): 2.05 cm AV Vmax:        138.00 cm/s AV Peak Grad:   7.6 mmHg LVOT Vmax:      90.10 cm/s LVOT Vmean:     64.300 cm/s LVOT VTI:       0.199 m  AORTA Ao Root diam: 2.90 cm Ao Asc diam:  3.10 cm MITRAL VALVE MV Area (PHT): 3.70 cm    SHUNTS MV Decel Time: 205  msec    Systemic VTI:  0.20 m MV E velocity: 75.40 cm/s  Systemic Diam: 2.00 cm MV A velocity: 50.60 cm/s MV E/A ratio:  1.49 Mertie Moores MD Electronically signed by Mertie Moores MD Signature  Date/Time: 10/20/2021/10:42:33 AM    Final     Impression/Plan: Right breast cancer, estrogen insensitive, with previous history of radiation to the right breast. She declined mastectomy. S/p breast conserving surgery.  We discussed adjuvant radiotherapy today.  I recommend 5-6 weeks of re-irradiation of the right breast in order to reduce the risk of locoregional recurrence.  I reviewed the logistics, benefits, risks, and potential side effects of this treatment in detail. Risks may include but not necessarily be limited to acute and late injury tissue in the radiation fields such as skin irritation (change in color/pigmentation, itching, dryness, pain, peeling). She may experience fatigue. We also discussed possible risk of long term cosmetic changes, significant fibrosis, or scar tissue. There is also a smaller risk for lung toxicity, brachial plexopathy, lymphedema, musculoskeletal changes, rib fracture/fragility or induction of a second malignancy, late chronic non-healing soft tissue wound.    The patient asked good questions which I answered to her satisfaction. She initially did not wish to undergo radiation, but once we discussed the pros and cons  of treatment and nature of her disease, she became enthusiastic about proceeding with treatment.  Given her fatigue, she wishes to wait a few more weeks and she does not feel she can take on more side effects immediately after finishing taxol. We will schedule her for treatment planning at the end of the month.  This encounter was provided by telemedicine platform; patient desired telemedicine during pandemic precautions.  MyChart video wasn't available; telephone was used. The patient has given verbal consent for this type of encounter and has been advised to only  accept a meeting of this type in a secure network environment. On date of service, in total, I spent 30 minutes on this encounter.   The attendants for this meeting include Eppie Gibson  and Kathyrn Sheriff During the encounter, Eppie Gibson was located at [facility] Radiation Oncology Department.  KINDAL PONTI was located at home.   _____________________________________   Eppie Gibson, MD  This document serves as a record of services personally performed by Eppie Gibson, MD. It was created on her behalf by Roney Mans, a trained medical scribe. The creation of this record is based on the scribe's personal observations and the provider's statements to them. This document has been checked and approved by the attending provider.

## 2021-10-27 NOTE — Progress Notes (Signed)
Patient Care Team: Luetta Nutting, DO as PCP - General (Family Medicine) Eppie Gibson, MD as Consulting Physician (Radiation Oncology) Mickeal Skinner, Acey Lav, MD as Consulting Physician (Oncology) [facility] [place] (Op[facility]mology) Bridget Hartshorn, Noel Journey, MD as Referring Physician (Dermatology) Helayne Seminole, MD as Referring Physician (Otolaryngology) Mauro Kaufmann, RN as Oncology Nurse Navigator Rockwell Germany, RN as Oncology Nurse Navigator  DIAGNOSIS:  Encounter Diagnosis  Name Primary?   Malignant neoplasm of upper-outer quadrant of right female breast, unspecified estrogen receptor status ([name])     SUMMARY OF ONCOLOGIC HISTORY: Oncology History  Cancer of upper-outer quadrant of female breast ([name])  2013 Initial Diagnosis   Right breast cancer: Stage Ia ER/PR positive HER2 negative status postlumpectomy, radiation (low risk Oncotype) could not tolerate tamoxifen for more than 30 days.   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 No pathogenic variants identified on the [facility] Multi-Cancer Panel+RNA. VUS in POLD1 identified called c.2429C>T. The report date is 07/01/2021.  The Multi-Cancer Panel + RNA offered by [facility] includes sequencing and/or deletion duplication testing of the following 84 genes: AIP, ALK, APC, ATM, AXIN2,BAP1,  BARD1, BLM, BMPR1A, BRCA1, BRCA2, BRIP1, CASR, CDC73, CDH1, CDK4, CDKN1B, CDKN1C, CDKN2A (p14ARF), CDKN2A (p16INK4a), CEBPA, CHEK2, CTNNA1, DICER1, DIS3L2, EGFR (c.2369C>T, p.Thr790Met variant only), EPCAM (Deletion/duplication testing only), FH, FLCN, GATA2, GPC3, GREM1 (Promoter region deletion/duplication testing only), HOXB13 (c.251G>A, p.Gly84Glu), HRAS, KIT, MAX, MEN1, MET, MITF (c.952G>A, p.Glu318Lys variant only), MLH1, MSH2, MSH3, MSH6, MUTYH, NBN, NF1, NF2, NTHL1, PALB2, PDGFRA, PHOX2B, PMS2, POLD1, POLE, POT1, PRKAR1A, PTCH1, PTEN, RAD50, RAD51C, RAD51D, RB1, RECQL4, RET, RUNX1, SDHAF2, SDHA (sequence changes only), SDHB, SDHC, SDHD, SMAD4, SMARCA4, SMARCB1, SMARCE1, STK11, SUFU, TERC, TERT, TMEM127, TP53, TSC1, TSC2, VHL, WRN and WT1.   07/21/2021 -  Chemotherapy   Patient is on Treatment Plan : BREAST Paclitaxel + Trastuzumab q7d / Trastuzumab q21d       CHIEF COMPLIANT: Follow-up on Herceptin maintenance  INTERVAL HISTORY: Lori Mcmillan is a 61 year old above-mentioned history of HER2 positive breast cancer finished 12 cycles of Taxol Herceptin and is here today to receive her Herceptin maintenance.  She appears to be complaining of fatigue and generalized weakness.  She is also having poor appetite and weight gain issues.  Denies any nausea or vomiting.  She had an echocardiogram which showed a decrease in the ejection fraction 50 to 55%.   ALLERGIES:  is allergic to lidocaine.  MEDICATIONS:  Current Outpatient Medications  Medication Sig Dispense Refill   ALPRAZolam (XANAX) 0.25 MG tablet Take 1 tablet (0.25 mg  total) by mouth 2 (two) times  daily as needed for anxiety. 30 tablet 1   amitriptyline (ELAVIL) 25 MG tablet TAKE 1 TABLET BY MOUTH EVERYDAY AT BEDTIME 90 tablet 1   diazepam (VALIUM) 2 MG tablet Take by mouth.     lamoTRIgine (LAMICTAL) 25 MG tablet Take 2 tablets (50 mg total) by mouth daily. 60 tablet 3   lidocaine-prilocaine (EMLA) cream Apply to affected area once 30 g 3   ondansetron (ZOFRAN) 8 MG tablet Take 1 tablet (8 mg total) by mouth 2 (two) times daily as needed (Nausea or vomiting). 30 tablet 1   predniSONE (DELTASONE) 10 MG tablet Take 1 tablet (10 mg total) by mouth daily with breakfast. 60 tablet 0   prochlorperazine (COMPAZINE) 10 MG tablet Take 1 tablet (10 mg total) by mouth every 6 (six) hours as needed (Nausea or vomiting). 30 tablet 1   No current facility-administered medications for this visit.    PHYSICAL EXAMINATION: ECOG PERFORMANCE STATUS: 1 - Symptomatic but completely ambulatory  Vitals:   10/27/21 1109  BP: 140/77  Pulse: 74  Resp: 18  Temp: (!) 97.3 F (36.3 C)  SpO2: 100%   Filed Weights   10/27/21 1109  Weight: 159 lb 14.4 oz (72.5 kg)      LABORATORY DATA:  I have reviewed the data as listed CMP Latest Ref Rng & Units 10/06/2021 09/29/2021 09/23/2021  Glucose 70 - 99 mg/dL 89 89 87  BUN 6 - 20 mg/dL _0 Creatinine 0.44 - 1.00 mg/dL 0.65 0.70 0.72  Sodium 135 - 145 mmol/L 137 140 139  Potassium 3.5 - 5.1 mmol/L 4.1 3.9 3.9  Chloride 98 - 111 mmol/L 106 108 109  CO2 22 - 32 mmol/L _1 Calcium 8.9 - 10.3 mg/dL 8.8(L) 8.8(L) 8.5(L)  Total Protein 6.5 - 8.1 g/dL 6.6 6.7 6.4(L)  Total Bilirubin 0.3 - 1.2 mg/dL 0.3 0.3 0.2(L)  Alkaline Phos 38 - 126 U/L 77 77 79  AST 15 - 41 U/L _2 ALT 0 - 44 U/L _3 Lab Results  Component Value Date   WBC 9.5 10/27/2021   HGB 11.1 (L) 10/27/2021   HCT 34.4 (L) 10/27/2021   MCV 92.0 10/27/2021   PLT 316 10/27/2021   NEUTROABS 6.4 10/27/2021    ASSESSMENT & PLAN:  Cancer of upper-outer quadrant of  female breast (Lori Mcmillan) 06/18/2021:Right lumpectomy: 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  Adjuvant chemotherapy with Taxol and Herceptin followed by Herceptin maintenance 2. discussion regarding pros and cons of reirradiation (Dr. Isidore Moos will need to inform us if she is eligible for radiation given her prior history of radiation therapy to the breast) ----------------------------------------------------------------------------------------------------------------------------------- Current treatment: Completed 12 cycles of Taxol Herceptin, today is Herceptin maintenance Echocardiogram: EF 50-55% (okay to treat) sent a referral to Dr. Haroldine Laws   Chemo toxicities: Fatigue otherwise tolerated the treatment extremely well. 2. Chemotherapy-induced anemia: Today's hemoglobin is 11.1 3.  Diarrhea: She did not have diarrhea with the last cycle   She informed me that she does not want to do radiation and will discuss with Dr. Isidore Moos tomorrow. Return to clinic every 3 weeks for Herceptin every  6 weeks with follow-up with me   No orders of the defined types were placed in this encounter.  The patient has a good understanding of the overall plan. she agrees with it. she will call with any problems that may develop before the next visit here. Total time spent: 30 mins including face to face time and time spent for planning, charting and co-ordination of care   Harriette Ohara, MD 10/27/21

## 2021-10-28 ENCOUNTER — Other Ambulatory Visit: Payer: Medicare Other

## 2021-10-28 ENCOUNTER — Ambulatory Visit
Admission: RE | Admit: 2021-10-28 | Discharge: 2021-10-28 | Disposition: A | Discharge status: Home / Self Care | Payer: Medicare Other | Source: Ambulatory Visit | Attending: Radiation Oncology | Admitting: Radiation Oncology

## 2021-10-28 ENCOUNTER — Ambulatory Visit: Payer: Medicare Other

## 2021-10-28 ENCOUNTER — Telehealth: Payer: Self-pay | Admitting: Hematology and Oncology

## 2021-10-28 ENCOUNTER — Encounter: Payer: Self-pay | Admitting: Radiation Oncology

## 2021-10-28 DIAGNOSIS — C50411 Malignant neoplasm of upper-outer quadrant of right female breast: Secondary | ICD-10-CM | POA: Diagnosis not present

## 2021-10-28 DIAGNOSIS — Z171 Estrogen receptor negative status [ER-]: Secondary | ICD-10-CM

## 2021-10-28 NOTE — Telephone Encounter (Signed)
Scheduled appointment per 01/10 los. Left message.

## 2021-10-28 NOTE — Progress Notes (Signed)
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 BREAST, RIGHT, LUMPECTOMY:  - Invasive ductal carcinoma, 1.5 cm, grade 3  - Ductal carcinoma in situ, high-grade  - Resection margins are negative for carcinoma - closest is the inferior  margin at 0.6 cm  - Biopsy site changes  - See oncology table  B. LYMPH NODE, RIGHT AXILLARY, SENTINEL, EXCISION:  - Lymph node, negative for carcinoma (0/1)  C. LYMPH NODE, RIGHT AXILLARY, SENTINEL, EXCISION:  - Lymph node, negative for carcinoma (0/1)   Receptor Status: ER(Negative), PR (Negative), Her2-neu (Positive), Ki-67(30%)  Did patient present with symptoms (if so, please note symptoms) or was this found on screening mammography?: Patient has a history of right breast cancer stage I diagnosed in 2013 treated with lumpectomy, sentinel lymph node mapping and radiation therapy. She received tamoxifen but did not stay on it. 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 (History of a brain tumor and has had radiation therapy to that. She has a strong family history of breast cancer as well. Also strong family history of melanoma and pancreatic cancer she states  Past/Anticipated interventions by surgeon, if any:  06/18/2021 --Dr. Marcello Moores [place] Right  breast seed localized lumpectomy  Right axillary sentinel lymph node mapping with methylene blue dye , lymphoseek and Mag trace  Port placement   Past/Anticipated interventions by medical oncology, if any:  Under care of Dr. Nicholas Lose 10/27/2021 --Current treatment: Completed 12 cycles of Taxol Herceptin, today is Herceptin maintenance Echocardiogram: EF 50-55% (okay to treat) sent a referral to Dr.  Haroldine Laws --Chemo toxicities: Fatigue otherwise tolerated the treatment extremely well. Chemotherapy-induced anemia: Today's hemoglobin is 11.1 Diarrhea: She did not have diarrhea with the last cycle --She informed me that she does not want to do radiation and will discuss with Dr. Isidore Moos tomorrow. --Return to clinic every 3 weeks for Herceptin every 6 weeks with follow-up with me  Under care of Dr. 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 She has not experienced further seizures.   Clinically stable on lower dose of Lamictal, without insomnia side effect.  Will con't this dose.   Recently had breast cancer surgery and has had soft tissue infection requiring antibiotics.   Scheduled to begin chemotherapy soon. Follow Up Instructions: RTC in 9 months with MRI brain for evaluation, or sooner if needed  Lymphedema issues, if any:  Patient denies, but does report difficulty with ROM to her right arm from where surgical site is in her axilla. Denies any PT assessment/evaluation since her surgery  Pain issues, if any: Reports discomfort/soreness to her right breast and axilla, particularly when she tries to do any activity with that arm.   SAFETY ISSUES: Prior radiation? Yes 11/30/2017 to 01/11/2018:  Right temporal lobe treated with 55.8 Gy in 31 fractions of 1.8 Gy on  02/24/2012 to 04/11/2012 Right breast / 45 Gray at 1.8 [place] Cables per fraction x 25 fractions Right breast boost / 16 Gray at [employer] per fraction x 8 fractions Pacemaker/ICD? No Possible current pregnancy? No--postmenopausal Is the patient on methotrexate? No  Current Complaints / other details:  Nothing else of note

## 2021-11-02 ENCOUNTER — Other Ambulatory Visit (HOSPITAL_COMMUNITY): Payer: Self-pay | Admitting: *Deleted

## 2021-11-02 ENCOUNTER — Telehealth (HOSPITAL_COMMUNITY): Payer: Self-pay | Admitting: Vascular Surgery

## 2021-11-02 DIAGNOSIS — C50411 Malignant neoplasm of upper-outer quadrant of right female breast: Secondary | ICD-10-CM

## 2021-11-02 NOTE — Telephone Encounter (Signed)
Left VM giving pt new brst cancer appt w/ echo/DB 3/17. Asked pt to call back to confirm appt date and time

## 2021-11-04 ENCOUNTER — Encounter: Payer: Self-pay | Admitting: *Deleted

## 2021-11-06 ENCOUNTER — Encounter (HOSPITAL_COMMUNITY): Payer: Self-pay | Admitting: Internal Medicine

## 2021-11-10 ENCOUNTER — Encounter: Payer: Self-pay | Admitting: Hematology and Oncology

## 2021-11-14 ENCOUNTER — Encounter: Payer: Self-pay | Admitting: Hematology and Oncology

## 2021-11-17 ENCOUNTER — Other Ambulatory Visit: Payer: Self-pay | Admitting: Hematology and Oncology

## 2021-11-17 ENCOUNTER — Other Ambulatory Visit: Payer: Self-pay

## 2021-11-17 ENCOUNTER — Inpatient Hospital Stay: Payer: Medicare Other

## 2021-11-17 ENCOUNTER — Ambulatory Visit
Admission: RE | Admit: 2021-11-17 | Discharge: 2021-11-17 | Disposition: A | Discharge status: Home / Self Care | Payer: Medicare Other | Source: Ambulatory Visit | Attending: Radiation Oncology | Admitting: Radiation Oncology

## 2021-11-17 VITALS — BP 120/79 | HR 71 | Temp 98.1°F | Resp 18 | Wt 158.2 lb

## 2021-11-17 DIAGNOSIS — C50411 Malignant neoplasm of upper-outer quadrant of right female breast: Secondary | ICD-10-CM

## 2021-11-17 DIAGNOSIS — Z510 Encounter for antineoplastic radiation therapy: Secondary | ICD-10-CM | POA: Diagnosis not present

## 2021-11-17 MED ORDER — HEPARIN SOD (PORK) LOCK FLUSH 100 UNIT/ML IV SOLN
500.0000 [IU] | Freq: Once | INTRAVENOUS | Status: AC | PRN
  Administered 2021-11-17: 500 [IU]

## 2021-11-17 MED ORDER — ACETAMINOPHEN 325 MG PO TABS
650.0000 mg | ORAL_TABLET | Freq: Once | ORAL | Status: AC
  Administered 2021-11-17: 650 mg via ORAL
  Filled 2021-11-17: qty 2

## 2021-11-17 MED ORDER — SODIUM CHLORIDE 0.9% FLUSH
10.0000 mL | INTRAVENOUS | Status: DC | PRN
  Administered 2021-11-17: 10 mL

## 2021-11-17 MED ORDER — TRASTUZUMAB-ANNS CHEMO 150 MG IV SOLR
6.0000 mg/kg | Freq: Once | INTRAVENOUS | Status: AC
  Administered 2021-11-17: 441 mg via INTRAVENOUS
  Filled 2021-11-17: qty 21

## 2021-11-17 MED ORDER — SODIUM CHLORIDE 0.9 % IV SOLN: Freq: Once | INTRAVENOUS | Status: AC

## 2021-11-17 MED ORDER — DIPHENHYDRAMINE HCL 25 MG PO CAPS
50.0000 mg | ORAL_CAPSULE | Freq: Once | ORAL | Status: AC
  Administered 2021-11-17: 25 mg via ORAL
  Filled 2021-11-17: qty 2

## 2021-11-20 DIAGNOSIS — C50411 Malignant neoplasm of upper-outer quadrant of right female breast: Secondary | ICD-10-CM | POA: Diagnosis not present

## 2021-11-20 DIAGNOSIS — Z510 Encounter for antineoplastic radiation therapy: Secondary | ICD-10-CM | POA: Diagnosis not present

## 2021-11-24 ENCOUNTER — Encounter: Payer: Self-pay | Admitting: *Deleted

## 2021-11-24 ENCOUNTER — Ambulatory Visit
Admission: RE | Admit: 2021-11-24 | Discharge: 2021-11-24 | Disposition: A | Discharge status: Home / Self Care | Payer: Medicare Other | Source: Ambulatory Visit | Attending: Radiation Oncology | Admitting: Radiation Oncology

## 2021-11-24 ENCOUNTER — Other Ambulatory Visit: Payer: Self-pay

## 2021-11-24 DIAGNOSIS — Z510 Encounter for antineoplastic radiation therapy: Secondary | ICD-10-CM | POA: Diagnosis not present

## 2021-11-24 DIAGNOSIS — C50411 Malignant neoplasm of upper-outer quadrant of right female breast: Secondary | ICD-10-CM | POA: Diagnosis not present

## 2021-11-25 ENCOUNTER — Ambulatory Visit
Admission: RE | Admit: 2021-11-25 | Discharge: 2021-11-25 | Disposition: A | Discharge status: Home / Self Care | Payer: Medicare Other | Source: Ambulatory Visit | Attending: Radiation Oncology | Admitting: Radiation Oncology

## 2021-11-25 DIAGNOSIS — C50411 Malignant neoplasm of upper-outer quadrant of right female breast: Secondary | ICD-10-CM | POA: Diagnosis not present

## 2021-11-25 DIAGNOSIS — Z510 Encounter for antineoplastic radiation therapy: Secondary | ICD-10-CM | POA: Diagnosis not present

## 2021-11-26 ENCOUNTER — Other Ambulatory Visit: Payer: Self-pay

## 2021-11-26 ENCOUNTER — Ambulatory Visit
Admission: RE | Admit: 2021-11-26 | Discharge: 2021-11-26 | Disposition: A | Discharge status: Home / Self Care | Payer: Medicare Other | Source: Ambulatory Visit | Attending: Radiation Oncology | Admitting: Radiation Oncology

## 2021-11-26 DIAGNOSIS — Z510 Encounter for antineoplastic radiation therapy: Secondary | ICD-10-CM | POA: Diagnosis not present

## 2021-11-26 DIAGNOSIS — C50411 Malignant neoplasm of upper-outer quadrant of right female breast: Secondary | ICD-10-CM | POA: Diagnosis not present

## 2021-11-27 ENCOUNTER — Ambulatory Visit
Admission: RE | Admit: 2021-11-27 | Discharge: 2021-11-27 | Disposition: A | Discharge status: Home / Self Care | Payer: Medicare Other | Source: Ambulatory Visit | Attending: Radiation Oncology | Admitting: Radiation Oncology

## 2021-11-27 DIAGNOSIS — C50411 Malignant neoplasm of upper-outer quadrant of right female breast: Secondary | ICD-10-CM | POA: Diagnosis not present

## 2021-11-27 DIAGNOSIS — Z510 Encounter for antineoplastic radiation therapy: Secondary | ICD-10-CM | POA: Diagnosis not present

## 2021-11-30 ENCOUNTER — Other Ambulatory Visit: Payer: Self-pay

## 2021-11-30 ENCOUNTER — Ambulatory Visit
Admission: RE | Admit: 2021-11-30 | Discharge: 2021-11-30 | Disposition: A | Discharge status: Home / Self Care | Payer: Medicare Other | Source: Ambulatory Visit | Attending: Radiation Oncology | Admitting: Radiation Oncology

## 2021-11-30 DIAGNOSIS — Z510 Encounter for antineoplastic radiation therapy: Secondary | ICD-10-CM | POA: Diagnosis not present

## 2021-11-30 DIAGNOSIS — C50411 Malignant neoplasm of upper-outer quadrant of right female breast: Secondary | ICD-10-CM | POA: Diagnosis not present

## 2021-11-30 NOTE — Progress Notes (Signed)
Pt here for patient teaching. Pt given Radiation and You booklet, skin care instructions, Alra deodorant, and Sonafine. Reviewed areas of pertinence such as fatigue, hair loss, nausea and vomiting, skin changes, headache, breast tenderness, breast swelling, earaches, and taste changes. Pt able to give teach back of to pat skin, use unscented/gentle soap, and drink plenty of water, apply Radiaplex bid, apply Sonafine bid, avoid applying anything to skin within 4 hours of treatment, avoid wearing an under wire bra, and to use an electric razor if they must shave. Pt verbalizes understanding of information given and will contact nursing with any questions or concerns.     [email or web address]

## 2021-12-01 ENCOUNTER — Ambulatory Visit
Admission: RE | Admit: 2021-12-01 | Discharge: 2021-12-01 | Disposition: A | Discharge status: Home / Self Care | Payer: Medicare Other | Source: Ambulatory Visit | Attending: Radiation Oncology | Admitting: Radiation Oncology

## 2021-12-01 ENCOUNTER — Other Ambulatory Visit: Payer: Self-pay

## 2021-12-01 DIAGNOSIS — C50411 Malignant neoplasm of upper-outer quadrant of right female breast: Secondary | ICD-10-CM | POA: Diagnosis not present

## 2021-12-01 DIAGNOSIS — Z510 Encounter for antineoplastic radiation therapy: Secondary | ICD-10-CM | POA: Diagnosis not present

## 2021-12-02 ENCOUNTER — Ambulatory Visit
Admission: RE | Admit: 2021-12-02 | Discharge: 2021-12-02 | Disposition: A | Discharge status: Home / Self Care | Payer: Medicare Other | Source: Ambulatory Visit | Attending: Radiation Oncology | Admitting: Radiation Oncology

## 2021-12-02 DIAGNOSIS — C50411 Malignant neoplasm of upper-outer quadrant of right female breast: Secondary | ICD-10-CM | POA: Diagnosis not present

## 2021-12-02 DIAGNOSIS — Z510 Encounter for antineoplastic radiation therapy: Secondary | ICD-10-CM | POA: Diagnosis not present

## 2021-12-03 ENCOUNTER — Ambulatory Visit
Admission: RE | Admit: 2021-12-03 | Discharge: 2021-12-03 | Disposition: A | Discharge status: Home / Self Care | Payer: Medicare Other | Source: Ambulatory Visit | Attending: Radiation Oncology | Admitting: Radiation Oncology

## 2021-12-03 ENCOUNTER — Other Ambulatory Visit: Payer: Self-pay

## 2021-12-03 DIAGNOSIS — Z510 Encounter for antineoplastic radiation therapy: Secondary | ICD-10-CM | POA: Diagnosis not present

## 2021-12-03 DIAGNOSIS — C50411 Malignant neoplasm of upper-outer quadrant of right female breast: Secondary | ICD-10-CM | POA: Diagnosis not present

## 2021-12-04 ENCOUNTER — Ambulatory Visit
Admission: RE | Admit: 2021-12-04 | Discharge: 2021-12-04 | Disposition: A | Discharge status: Home / Self Care | Payer: Medicare Other | Source: Ambulatory Visit | Attending: Radiation Oncology | Admitting: Radiation Oncology

## 2021-12-04 DIAGNOSIS — Z510 Encounter for antineoplastic radiation therapy: Secondary | ICD-10-CM | POA: Diagnosis not present

## 2021-12-04 DIAGNOSIS — C50411 Malignant neoplasm of upper-outer quadrant of right female breast: Secondary | ICD-10-CM | POA: Diagnosis not present

## 2021-12-07 ENCOUNTER — Ambulatory Visit
Admission: RE | Admit: 2021-12-07 | Discharge: 2021-12-07 | Disposition: A | Discharge status: Home / Self Care | Payer: Medicare Other | Source: Ambulatory Visit | Attending: Radiation Oncology | Admitting: Radiation Oncology

## 2021-12-07 ENCOUNTER — Other Ambulatory Visit: Payer: Self-pay

## 2021-12-07 DIAGNOSIS — Z510 Encounter for antineoplastic radiation therapy: Secondary | ICD-10-CM | POA: Diagnosis not present

## 2021-12-07 DIAGNOSIS — C50411 Malignant neoplasm of upper-outer quadrant of right female breast: Secondary | ICD-10-CM | POA: Diagnosis not present

## 2021-12-07 NOTE — Progress Notes (Signed)
Patient Care Team: Luetta Nutting, DO as PCP - General (Family Medicine) Lori Gibson, MD as Consulting Physician (Radiation Oncology) Mickeal Skinner Acey Lav, MD as Consulting Physician (Oncology) [facility] [place] (Ophthal[facility]) Bridget Hartshorn, Noel Journey, MD as Referring Physician (Dermatology) Helayne Seminole, MD as Referring Physician (Otolaryngology) Mauro Kaufmann, RN as Oncology Nurse Navigator Rockwell Germany, RN as Oncology Nurse Navigator  DIAGNOSIS:    ICD-10-CM   1. Malignant neoplasm of upper-outer quadrant of right female breast, unspecified estrogen receptor status ([name])  C50.411       SUMMARY OF ONCOLOGIC HISTORY: Oncology History  Cancer of upper-outer quadrant of female breast ([place])  2013 Initial Diagnosis   Right breast cancer: Stage Ia ER/PR positive HER2 negative status postlumpectomy, radiation (low risk Oncotype) could not tolerate tamoxifen for more than 30 days.   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  Biopsy revealed grade 3 IDC ER/PR negative HER2 positive with a Ki-67 of 30%   05/26/2021 Cancer Staging   Staging form: Breast, AJCC 7th Edition - Clinical stage from 05/26/2021: Stage Unknown (T1c, NX, cM0) - Signed by Lori Gibson, MD on 05/28/2021 Specimen type: Core Needle Biopsy Stage prefix: Initial diagnosis Laterality: Right Tumor grade (Scarff-Bloom-Richardson system): G3 Estrogen receptor status: Negative Progesterone receptor status: Negative HER2 status: Positive Staging comments: Staged at Breast Cancer Conference 12/08/11     06/18/2021 Surgery   Right lumpectomy: Grade 3 IDC 1.5 cm, high-grade DCIS, margins negative, 0/2 lymph nodes negative, ER 0%, PR 0%, HER2 3+ positive, Ki-67 30%    Genetic Testing    Negative genetic testing. No pathogenic variants identified on the [facility] Multi-Cancer Panel+RNA. VUS in POLD1 identified called c.2429C>T. The report date is 07/01/2021.  The Multi-Cancer Panel + RNA offered by [facility] includes sequencing and/or deletion duplication testing of the following 84 genes: AIP, ALK, APC, ATM, AXIN2,BAP1,  BARD1, BLM, BMPR1A, BRCA1, BRCA2, BRIP1, CASR, CDC73, CDH1, CDK4, CDKN1B, CDKN1C, CDKN2A (p14ARF), CDKN2A (p16INK4a), CEBPA, CHEK2, CTNNA1, DICER1, DIS3L2, EGFR (c.2369C>T, p.Thr790Met variant only), EPCAM (Deletion/duplication testing only), FH, FLCN, GATA2, GPC3, GREM1 (Promoter region deletion/duplication testing only), HOXB13 (c.251G>A, p.Gly84Glu), HRAS, KIT, MAX, MEN1, MET, MITF (c.952G>A, p.Glu318Lys variant only), MLH1, MSH2, MSH3, MSH6, MUTYH, NBN, NF1, NF2, NTHL1, PALB2, PDGFRA, PHOX2B, PMS2, POLD1, POLE, POT1, PRKAR1A, PTCH1, PTEN, RAD50, RAD51C, RAD51D, RB1, RECQL4, RET, RUNX1, SDHAF2, SDHA (sequence changes only), SDHB, SDHC, SDHD, SMAD4, SMARCA4, SMARCB1, SMARCE1, STK11, SUFU, TERC, TERT, TMEM127, TP53, TSC1, TSC2, VHL, WRN and WT1.   07/21/2021 -  Chemotherapy   Patient is on Treatment Plan : BREAST Paclitaxel + Trastuzumab q7d / Trastuzumab q21d       CHIEF COMPLIANT: Follow-up on Herceptin maintenance  INTERVAL HISTORY: Lori Mcmillan is a 61 y.o. with above-mentioned history of  HER2 positive breast cancer finished 12 cycles of Taxol Herceptin, currently on radiation therapy. She presents to the clinic today for Herceptin maintenance.  She is experiencing fatigue from radiation therapy.  Denies any side effects to Herceptin.  ALLERGIES:  has no active allergies.  MEDICATIONS:  Current Outpatient Medications  Medication Sig Dispense Refill   ALPRAZolam (XANAX) 0.25 MG tablet Take 1 tablet (0.25 mg total) by mouth 2 (two) times daily as needed for anxiety. 30 tablet 1   amitriptyline (ELAVIL) 25 MG  tablet TAKE 1 TABLET BY MOUTH EVERYDAY AT BEDTIME  90 tablet 1   diazepam (VALIUM) 2 MG tablet Take by mouth.     lamoTRIgine (LAMICTAL) 25 MG tablet Take 2 tablets (50 mg total) by mouth daily. 60 tablet 3   lidocaine-prilocaine (EMLA) cream Apply to affected area once 30 g 3   ondansetron (ZOFRAN) 8 MG tablet Take 1 tablet (8 mg total) by mouth 2 (two) times daily as needed (Nausea or vomiting). 30 tablet 1   predniSONE (DELTASONE) 10 MG tablet Take 1 tablet (10 mg total) by mouth daily with breakfast. 60 tablet 0   prochlorperazine (COMPAZINE) 10 MG tablet Take 1 tablet (10 mg total) by mouth every 6 (six) hours as needed (Nausea or vomiting). 30 tablet 1   No current facility-administered medications for this visit.    PHYSICAL EXAMINATION: ECOG PERFORMANCE STATUS: 1 - Symptomatic but completely ambulatory  Vitals:   12/08/21 1001  BP: (!) 146/75  Pulse: 70  Resp: 18  Temp: (!) 97.3 F (36.3 C)  SpO2: 100%   Filed Weights   12/08/21 1001  Weight: 158 lb 14.4 oz (72.1 kg)    LABORATORY DATA:  I have reviewed the data as listed CMP Latest Ref Rng & Units 10/27/2021 10/06/2021 09/29/2021  Glucose 70 - 99 mg/dL 92 89 89  BUN 6 - 20 mg/dL $Remove'15 12 14  'JMwcmZK$ Creatinine 0.44 - 1.00 mg/dL 0.66 0.65 0.70  Sodium 135 - 145 mmol/L 137 137 140  Potassium 3.5 - 5.1 mmol/L 3.9 4.1 3.9  Chloride 98 - 111 mmol/L 105 106 108  CO2 22 - 32 mmol/L $RemoveB'25 25 23  'mPElFZJT$ Calcium 8.9 - 10.3 mg/dL 8.8(L) 8.8(L) 8.8(L)  Total Protein 6.5 - 8.1 g/dL 6.7 6.6 6.7  Total Bilirubin 0.3 - 1.2 mg/dL 0.3 0.3 0.3  Alkaline Phos 38 - 126 U/L 79 77 77  AST 15 - 41 U/L $Remo'15 19 16  'GRJNq$ ALT 0 - 44 U/L $Remo'18 26 18    'TWfJv$ Lab Results  Component Value Date   WBC 8.1 12/08/2021   HGB 11.4 (L) 12/08/2021   HCT 34.8 (L) 12/08/2021   MCV 89.9 12/08/2021   PLT 273 12/08/2021   NEUTROABS 5.9 12/08/2021    ASSESSMENT & PLAN:  Cancer of upper-outer quadrant of female breast ([place]) 06/18/2021:Right lumpectomy: Grade 3 IDC 1.5 cm, high-grade DCIS, margins negative, 0/2 lymph nodes negative, ER  0%, PR 0%, HER2 3+ positive, Ki-67 30%    (2013 Right breast cancer: Stage Ia ER/PR positive HER2 negative status postlumpectomy, radiation (low risk Oncotype) could not tolerate tamoxifen for more than 30 days.)   Treatment plan: 1.  Adjuvant chemotherapy with Taxol and Herceptin followed by Herceptin maintenance 2. discussion regarding pros and cons of reirradiation (Dr. Isidore Moos will need to inform us if she is eligible for radiation given her prior history of radiation therapy to the breast) ----------------------------------------------------------------------------------------------------------------------------------- Current treatment: Completed 12 cycles of Taxol Herceptin, today is Herceptin maintenance Echocardiogram: EF 50-55% (okay to treat) sent a referral to Dr. Haroldine Laws   Chemo toxicities: Fatigue: She attributes this to radiation. 2. Chemotherapy-induced anemia: Today's hemoglobin is 11.4 3.  Diarrhea: No issues with diarrhea   Return to clinic every 3 weeks for Herceptin every 6 weeks with follow-up with me   No orders of the defined types were placed in this encounter.  The patient has a good understanding of the overall plan. she agrees with it. she will call with any problems that may develop before the  next visit here.  Total time spent: 30 mins including face to face time and time spent for planning, charting and coordination of care  Rulon Eisenmenger, MD, MPH 12/08/2021  I, Thana Ates, am acting as scribe for Dr. Nicholas Lose.  I have reviewed the above documentation for accuracy and completeness, and I agree with the above.

## 2021-12-08 ENCOUNTER — Inpatient Hospital Stay: Payer: Medicare Other

## 2021-12-08 ENCOUNTER — Ambulatory Visit
Admission: RE | Admit: 2021-12-08 | Discharge: 2021-12-08 | Disposition: A | Discharge status: Home / Self Care | Payer: Medicare Other | Source: Ambulatory Visit | Attending: Radiation Oncology | Admitting: Radiation Oncology

## 2021-12-08 ENCOUNTER — Inpatient Hospital Stay (HOSPITAL_BASED_OUTPATIENT_CLINIC_OR_DEPARTMENT_OTHER): Payer: Medicare Other | Admitting: Hematology and Oncology

## 2021-12-08 DIAGNOSIS — Z79899 Other long term (current) drug therapy: Secondary | ICD-10-CM | POA: Insufficient documentation

## 2021-12-08 DIAGNOSIS — R5383 Other fatigue: Secondary | ICD-10-CM | POA: Insufficient documentation

## 2021-12-08 DIAGNOSIS — Z923 Personal history of irradiation: Secondary | ICD-10-CM | POA: Insufficient documentation

## 2021-12-08 DIAGNOSIS — Z5111 Encounter for antineoplastic chemotherapy: Secondary | ICD-10-CM | POA: Insufficient documentation

## 2021-12-08 DIAGNOSIS — C50411 Malignant neoplasm of upper-outer quadrant of right female breast: Secondary | ICD-10-CM

## 2021-12-08 DIAGNOSIS — D6481 Anemia due to antineoplastic chemotherapy: Secondary | ICD-10-CM | POA: Insufficient documentation

## 2021-12-08 DIAGNOSIS — Z510 Encounter for antineoplastic radiation therapy: Secondary | ICD-10-CM | POA: Diagnosis not present

## 2021-12-08 DIAGNOSIS — Z95828 Presence of other vascular implants and grafts: Secondary | ICD-10-CM

## 2021-12-08 DIAGNOSIS — Z171 Estrogen receptor negative status [ER-]: Secondary | ICD-10-CM | POA: Insufficient documentation

## 2021-12-08 LAB — CBC WITH DIFFERENTIAL (CANCER CENTER ONLY)
Abs Immature Granulocytes: 0.02 10*3/uL (ref 0.00–0.07)
Basophils Absolute: 0 10*3/uL (ref 0.0–0.1)
Basophils Relative: 0 %
Eosinophils Absolute: 0.1 10*3/uL (ref 0.0–0.5)
Eosinophils Relative: 2 %
HCT: 34.8 % — ABNORMAL LOW (ref 36.0–46.0)
Hemoglobin: 11.4 g/dL — ABNORMAL LOW (ref 12.0–15.0)
Immature Granulocytes: 0 %
Lymphocytes Relative: 17 %
Lymphs Abs: 1.4 10*3/uL (ref 0.7–4.0)
MCH: 29.5 pg (ref 26.0–34.0)
MCHC: 32.8 g/dL (ref 30.0–36.0)
MCV: 89.9 fL (ref 80.0–100.0)
Monocytes Absolute: 0.6 10*3/uL (ref 0.1–1.0)
Monocytes Relative: 7 %
Neutro Abs: 5.9 10*3/uL (ref 1.7–7.7)
Neutrophils Relative %: 74 %
Platelet Count: 273 10*3/uL (ref 150–400)
RBC: 3.87 MIL/uL (ref 3.87–5.11)
RDW: 12.5 % (ref 11.5–15.5)
WBC Count: 8.1 10*3/uL (ref 4.0–10.5)
nRBC: 0 % (ref 0.0–0.2)

## 2021-12-08 LAB — CMP (CANCER CENTER ONLY)
ALT: 18 U/L (ref 0–44)
AST: 21 U/L (ref 15–41)
Albumin: 4.2 g/dL (ref 3.5–5.0)
Alkaline Phosphatase: 73 U/L (ref 38–126)
Anion gap: 4 — ABNORMAL LOW (ref 5–15)
BUN: 16 mg/dL (ref 6–20)
CO2: 27 mmol/L (ref 22–32)
Calcium: 9 mg/dL (ref 8.9–10.3)
Chloride: 107 mmol/L (ref 98–111)
Creatinine: 0.62 mg/dL (ref 0.44–1.00)
GFR, Estimated: >60 mL/min (ref >60)
Glucose, Bld: 94 mg/dL (ref 70–99)
Potassium: 4 mmol/L (ref 3.5–5.1)
Sodium: 138 mmol/L (ref 135–145)
Total Bilirubin: 0.3 mg/dL (ref 0.3–1.2)
Total Protein: 6.8 g/dL (ref 6.5–8.1)

## 2021-12-08 MED ORDER — SODIUM CHLORIDE 0.9% FLUSH
10.0000 mL | Freq: Once | INTRAVENOUS | Status: AC
  Administered 2021-12-08: 10 mL

## 2021-12-08 MED ORDER — TRASTUZUMAB-ANNS CHEMO 150 MG IV SOLR
6.0000 mg/kg | Freq: Once | INTRAVENOUS | Status: AC
  Administered 2021-12-08: 441 mg via INTRAVENOUS
  Filled 2021-12-08: qty 21

## 2021-12-08 MED ORDER — DIPHENHYDRAMINE HCL 25 MG PO CAPS
25.0000 mg | ORAL_CAPSULE | Freq: Once | ORAL | Status: AC
  Administered 2021-12-08: 25 mg via ORAL
  Filled 2021-12-08: qty 1

## 2021-12-08 MED ORDER — ACETAMINOPHEN 325 MG PO TABS
650.0000 mg | ORAL_TABLET | Freq: Once | ORAL | Status: AC
  Administered 2021-12-08: 650 mg via ORAL
  Filled 2021-12-08: qty 2

## 2021-12-08 NOTE — Assessment & Plan Note (Signed)
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 Isidore Moos will need to informus if she is eligible for radiation given her prior history of radiation therapy to the breast) ----------------------------------------------------------------------------------------------------------------------------------- Current treatment: Completed 12 cycles ofTaxol Herceptin, today is Herceptin maintenance Echocardiogram: EF 50-55% (okay to treat) sent a referral to Dr. Haroldine Laws  Chemo toxicities: 1. Fatigue otherwise tolerated the treatment extremely well. 2.Chemotherapy-induced anemia: Today's hemoglobin is 11.1 3.Diarrhea:She did not have diarrhea with the last cycle   Return to clinicevery 3 weeks for Herceptin every 6 weeks with follow-up with me

## 2021-12-09 ENCOUNTER — Ambulatory Visit
Admission: RE | Admit: 2021-12-09 | Discharge: 2021-12-09 | Disposition: A | Discharge status: Home / Self Care | Payer: Medicare Other | Source: Ambulatory Visit | Attending: Radiation Oncology | Admitting: Radiation Oncology

## 2021-12-09 ENCOUNTER — Other Ambulatory Visit: Payer: Self-pay

## 2021-12-09 DIAGNOSIS — Z510 Encounter for antineoplastic radiation therapy: Secondary | ICD-10-CM | POA: Diagnosis not present

## 2021-12-09 DIAGNOSIS — C50411 Malignant neoplasm of upper-outer quadrant of right female breast: Secondary | ICD-10-CM | POA: Diagnosis not present

## 2021-12-10 ENCOUNTER — Ambulatory Visit: Payer: Medicare Other

## 2021-12-11 ENCOUNTER — Ambulatory Visit: Payer: Medicare Other

## 2021-12-11 DIAGNOSIS — C50411 Malignant neoplasm of upper-outer quadrant of right female breast: Secondary | ICD-10-CM | POA: Diagnosis not present

## 2021-12-11 DIAGNOSIS — Z510 Encounter for antineoplastic radiation therapy: Secondary | ICD-10-CM | POA: Diagnosis not present

## 2021-12-14 ENCOUNTER — Other Ambulatory Visit: Payer: Self-pay

## 2021-12-14 ENCOUNTER — Ambulatory Visit
Admission: RE | Admit: 2021-12-14 | Discharge: 2021-12-14 | Disposition: A | Discharge status: Home / Self Care | Payer: Medicare Other | Source: Ambulatory Visit | Attending: Radiation Oncology | Admitting: Radiation Oncology

## 2021-12-14 DIAGNOSIS — C50411 Malignant neoplasm of upper-outer quadrant of right female breast: Secondary | ICD-10-CM | POA: Diagnosis not present

## 2021-12-14 DIAGNOSIS — Z510 Encounter for antineoplastic radiation therapy: Secondary | ICD-10-CM | POA: Diagnosis not present

## 2021-12-14 NOTE — Progress Notes (Signed)
Oncology Nurse Navigator Documentation   Dr. Isidore Moos requested that I schedule an appointment for Ms. Umphlett with Dr. Blenda Nicely due to a missed follow up after an MRI completed one year ago. I called Dr. Trish Mage office and scheduled her for an appointment on 12/16/21 at 2:50 pm. The scheduler also completed a note to Dr. Blenda Nicely explaining the reason for the appointment. I then called Ms. Scaduto with her appointment date and time but she informed me that she has another appointment scheduled at the same time that she couldn't reschedule. I provided her with Dr. Trish Mage office number for her to reschedule her appointment which she planned to do when we hung up. I also provided her with my direct contact information if she needed further assistance.   Harlow Asa RN, BSN, OCN Head & Neck Oncology Nurse [place] [facility] Phone # [phone]  Fax # [phone]

## 2021-12-15 ENCOUNTER — Ambulatory Visit
Admission: RE | Admit: 2021-12-15 | Discharge: 2021-12-15 | Disposition: A | Discharge status: Home / Self Care | Payer: Medicare Other | Source: Ambulatory Visit | Attending: Radiation Oncology | Admitting: Radiation Oncology

## 2021-12-15 DIAGNOSIS — D329 Benign neoplasm of meninges, unspecified: Secondary | ICD-10-CM | POA: Diagnosis not present

## 2021-12-15 DIAGNOSIS — C50411 Malignant neoplasm of upper-outer quadrant of right female breast: Secondary | ICD-10-CM | POA: Diagnosis not present

## 2021-12-15 DIAGNOSIS — R42 Dizziness and giddiness: Secondary | ICD-10-CM | POA: Diagnosis not present

## 2021-12-15 DIAGNOSIS — H9311 Tinnitus, right ear: Secondary | ICD-10-CM | POA: Diagnosis not present

## 2021-12-15 DIAGNOSIS — Z510 Encounter for antineoplastic radiation therapy: Secondary | ICD-10-CM | POA: Diagnosis not present

## 2021-12-16 ENCOUNTER — Other Ambulatory Visit: Payer: Self-pay

## 2021-12-16 ENCOUNTER — Ambulatory Visit
Admission: RE | Admit: 2021-12-16 | Discharge: 2021-12-16 | Disposition: A | Discharge status: Home / Self Care | Payer: Medicare Other | Source: Ambulatory Visit | Attending: Radiation Oncology | Admitting: Radiation Oncology

## 2021-12-16 DIAGNOSIS — C50411 Malignant neoplasm of upper-outer quadrant of right female breast: Secondary | ICD-10-CM | POA: Insufficient documentation

## 2021-12-17 ENCOUNTER — Ambulatory Visit
Admission: RE | Admit: 2021-12-17 | Discharge: 2021-12-17 | Disposition: A | Discharge status: Home / Self Care | Payer: Medicare Other | Source: Ambulatory Visit | Attending: Radiation Oncology | Admitting: Radiation Oncology

## 2021-12-17 DIAGNOSIS — C50411 Malignant neoplasm of upper-outer quadrant of right female breast: Secondary | ICD-10-CM | POA: Diagnosis not present

## 2021-12-18 ENCOUNTER — Ambulatory Visit
Admission: RE | Admit: 2021-12-18 | Discharge: 2021-12-18 | Disposition: A | Discharge status: Home / Self Care | Payer: Medicare Other | Source: Ambulatory Visit | Attending: Radiation Oncology | Admitting: Radiation Oncology

## 2021-12-18 ENCOUNTER — Other Ambulatory Visit: Payer: Self-pay

## 2021-12-18 DIAGNOSIS — C50411 Malignant neoplasm of upper-outer quadrant of right female breast: Secondary | ICD-10-CM | POA: Diagnosis not present

## 2021-12-21 ENCOUNTER — Other Ambulatory Visit: Payer: Self-pay

## 2021-12-21 ENCOUNTER — Ambulatory Visit
Admission: RE | Admit: 2021-12-21 | Discharge: 2021-12-21 | Disposition: A | Discharge status: Home / Self Care | Payer: Medicare Other | Source: Ambulatory Visit | Attending: Radiation Oncology | Admitting: Radiation Oncology

## 2021-12-21 DIAGNOSIS — C50411 Malignant neoplasm of upper-outer quadrant of right female breast: Secondary | ICD-10-CM | POA: Diagnosis not present

## 2021-12-21 MED ORDER — RADIAPLEXRX EX GEL: Freq: Once | CUTANEOUS | Status: AC

## 2021-12-22 ENCOUNTER — Ambulatory Visit
Admission: RE | Admit: 2021-12-22 | Discharge: 2021-12-22 | Disposition: A | Discharge status: Home / Self Care | Payer: Medicare Other | Source: Ambulatory Visit | Attending: Radiation Oncology | Admitting: Radiation Oncology

## 2021-12-22 DIAGNOSIS — C50411 Malignant neoplasm of upper-outer quadrant of right female breast: Secondary | ICD-10-CM | POA: Diagnosis not present

## 2021-12-23 ENCOUNTER — Other Ambulatory Visit: Payer: Self-pay

## 2021-12-23 ENCOUNTER — Ambulatory Visit
Admission: RE | Admit: 2021-12-23 | Discharge: 2021-12-23 | Disposition: A | Discharge status: Home / Self Care | Payer: Medicare Other | Source: Ambulatory Visit | Attending: Radiation Oncology | Admitting: Radiation Oncology

## 2021-12-23 DIAGNOSIS — C50411 Malignant neoplasm of upper-outer quadrant of right female breast: Secondary | ICD-10-CM | POA: Diagnosis not present

## 2021-12-24 ENCOUNTER — Ambulatory Visit
Admission: RE | Admit: 2021-12-24 | Discharge: 2021-12-24 | Disposition: A | Discharge status: Home / Self Care | Payer: Medicare Other | Source: Ambulatory Visit | Attending: Radiation Oncology | Admitting: Radiation Oncology

## 2021-12-24 DIAGNOSIS — C50411 Malignant neoplasm of upper-outer quadrant of right female breast: Secondary | ICD-10-CM | POA: Diagnosis not present

## 2021-12-25 ENCOUNTER — Ambulatory Visit
Admission: RE | Admit: 2021-12-25 | Discharge: 2021-12-25 | Disposition: A | Discharge status: Home / Self Care | Payer: Medicare Other | Source: Ambulatory Visit | Attending: Radiation Oncology | Admitting: Radiation Oncology

## 2021-12-25 ENCOUNTER — Telehealth: Payer: Self-pay

## 2021-12-25 ENCOUNTER — Other Ambulatory Visit: Payer: Self-pay

## 2021-12-25 DIAGNOSIS — C50411 Malignant neoplasm of upper-outer quadrant of right female breast: Secondary | ICD-10-CM | POA: Diagnosis not present

## 2021-12-28 ENCOUNTER — Ambulatory Visit (HOSPITAL_COMMUNITY)
Admission: RE | Admit: 2021-12-28 | Discharge: 2021-12-28 | Disposition: A | Discharge status: Home / Self Care | Payer: Medicare Other | Source: Ambulatory Visit | Attending: Radiation Oncology | Admitting: Radiation Oncology

## 2021-12-28 ENCOUNTER — Ambulatory Visit
Admission: RE | Admit: 2021-12-28 | Discharge: 2021-12-28 | Disposition: A | Discharge status: Home / Self Care | Payer: Medicare Other | Source: Ambulatory Visit | Attending: Radiation Oncology | Admitting: Radiation Oncology

## 2021-12-28 ENCOUNTER — Other Ambulatory Visit: Payer: Self-pay

## 2021-12-28 DIAGNOSIS — M7989 Other specified soft tissue disorders: Secondary | ICD-10-CM | POA: Diagnosis not present

## 2021-12-28 DIAGNOSIS — M79661 Pain in right lower leg: Secondary | ICD-10-CM

## 2021-12-28 DIAGNOSIS — C50411 Malignant neoplasm of upper-outer quadrant of right female breast: Secondary | ICD-10-CM

## 2021-12-28 MED ORDER — RADIAPLEXRX EX GEL: Freq: Once | CUTANEOUS | Status: AC

## 2021-12-28 NOTE — Progress Notes (Signed)
Right lower extremity venous duplex has been completed. ?Preliminary results can be found in CV Proc through chart review.  ?Results were given to Dr. Isidore Moos. ? ?12/28/21 12:46 PM ?Lori Mcmillan RVT   ?

## 2021-12-29 ENCOUNTER — Ambulatory Visit
Admission: RE | Admit: 2021-12-29 | Discharge: 2021-12-29 | Disposition: A | Discharge status: Home / Self Care | Payer: Medicare Other | Source: Ambulatory Visit | Attending: Radiation Oncology | Admitting: Radiation Oncology

## 2021-12-29 ENCOUNTER — Inpatient Hospital Stay: Payer: Medicare Other

## 2021-12-29 VITALS — BP 151/87 | HR 77 | Temp 97.9°F | Resp 18 | Wt 158.4 lb

## 2021-12-29 DIAGNOSIS — C50411 Malignant neoplasm of upper-outer quadrant of right female breast: Secondary | ICD-10-CM | POA: Insufficient documentation

## 2021-12-29 DIAGNOSIS — Z5111 Encounter for antineoplastic chemotherapy: Secondary | ICD-10-CM | POA: Insufficient documentation

## 2021-12-29 MED ORDER — DIPHENHYDRAMINE HCL 25 MG PO CAPS
25.0000 mg | ORAL_CAPSULE | Freq: Once | ORAL | Status: AC
  Administered 2021-12-29: 25 mg via ORAL
  Filled 2021-12-29: qty 1

## 2021-12-29 MED ORDER — TRASTUZUMAB-ANNS CHEMO 150 MG IV SOLR
6.0000 mg/kg | Freq: Once | INTRAVENOUS | Status: AC
  Administered 2021-12-29: 441 mg via INTRAVENOUS
  Filled 2021-12-29: qty 21

## 2021-12-29 MED ORDER — ACETAMINOPHEN 325 MG PO TABS
650.0000 mg | ORAL_TABLET | Freq: Once | ORAL | Status: AC
  Administered 2021-12-29: 650 mg via ORAL
  Filled 2021-12-29: qty 2

## 2021-12-29 MED ORDER — SODIUM CHLORIDE 0.9% FLUSH
10.0000 mL | INTRAVENOUS | Status: DC | PRN
  Administered 2021-12-29: 10 mL

## 2021-12-29 MED ORDER — HEPARIN SOD (PORK) LOCK FLUSH 100 UNIT/ML IV SOLN
500.0000 [IU] | Freq: Once | INTRAVENOUS | Status: AC | PRN
  Administered 2021-12-29: 500 [IU]

## 2021-12-29 MED ORDER — SODIUM CHLORIDE 0.9 % IV SOLN: Freq: Once | INTRAVENOUS | Status: AC

## 2021-12-29 NOTE — Patient Instructions (Signed)
[facility]  Discharge Instructions: ?Thank you for choosing [facility] to provide your oncology and hematology care.  ? ?If you have a lab appointment with the Cancer Center, please go directly to the Cancer Center and check in at the registration area. ?  ?Wear comfortable clothing and clothing appropriate for easy access to any Portacath or PICC line.  ? ?We strive to give you quality time with your provider. You may need to reschedule your appointment if you arrive late (15 or more minutes).  Arriving late affects you and other patients whose appointments are after yours.  Also, if you miss three or more appointments without notifying the office, you may be dismissed from the clinic at the provider?s discretion.    ?  ?For prescription refill requests, have your pharmacy contact our office and allow 72 hours for refills to be completed.   ? ?Today you received the following chemotherapy and/or immunotherapy agents: Trastuzumab    ?  ?To help prevent nausea and vomiting after your treatment, we encourage you to take your nausea medication as directed. ? ?BELOW ARE SYMPTOMS THAT SHOULD BE REPORTED IMMEDIATELY: ?*FEVER GREATER THAN 100.4 F (38 ?C) OR HIGHER ?*CHILLS OR SWEATING ?*NAUSEA AND VOMITING THAT IS NOT CONTROLLED WITH YOUR NAUSEA MEDICATION ?*UNUSUAL SHORTNESS OF BREATH ?*UNUSUAL BRUISING OR BLEEDING ?*URINARY PROBLEMS (pain or burning when urinating, or frequent urination) ?*BOWEL PROBLEMS (unusual diarrhea, constipation, pain near the anus) ?TENDERNESS IN MOUTH AND THROAT WITH OR WITHOUT PRESENCE OF ULCERS (sore throat, sores in mouth, or a toothache) ?UNUSUAL RASH, SWELLING OR PAIN  ?UNUSUAL VAGINAL DISCHARGE OR ITCHING  ? ?Items with * indicate a potential emergency and should be followed up as soon as possible or go to the Emergency Department if any problems should occur. ? ?Please show the CHEMOTHERAPY ALERT CARD or IMMUNOTHERAPY ALERT CARD at check-in  to the Emergency Department and triage nurse. ? ?Should you have questions after your visit or need to cancel or reschedule your appointment, please contact [facility]  Dept: [phone]  and follow the prompts.  Office hours are 8:00 a.m. to 4:30 p.m. Monday - Friday. Please note that voicemails left after 4:00 p.m. may not be returned until the following business day.  We are closed weekends and major holidays. You have access to a nurse at all times for urgent questions. Please call the main number to the clinic Dept: [phone] and follow the prompts. ? ? ?For any non-urgent questions, you may also contact your provider using MyChart. We now offer e-Visits for anyone 18 and older to request care online for non-urgent symptoms. For details visit [email or web address]. ?  ?Also download the MyChart app! Go to the app store, search "MyChart", open the app, select , and log in with your MyChart username and password. ? ?Due to Covid, a mask is required upon entering the hospital/clinic. If you do not have a mask, one will be given to you upon arrival. For doctor visits, patients may have 1 support person aged 18 or older with them. For treatment visits, patients cannot have anyone with them due to current Covid guidelines and our immunocompromised population.  ? ?

## 2021-12-30 ENCOUNTER — Ambulatory Visit: Payer: Medicare Other

## 2021-12-30 ENCOUNTER — Ambulatory Visit
Admission: RE | Admit: 2021-12-30 | Discharge: 2021-12-30 | Disposition: A | Discharge status: Home / Self Care | Payer: Medicare Other | Source: Ambulatory Visit | Attending: Radiation Oncology | Admitting: Radiation Oncology

## 2021-12-30 DIAGNOSIS — C50411 Malignant neoplasm of upper-outer quadrant of right female breast: Secondary | ICD-10-CM | POA: Diagnosis not present

## 2021-12-31 ENCOUNTER — Encounter: Payer: Self-pay | Admitting: *Deleted

## 2021-12-31 ENCOUNTER — Ambulatory Visit: Payer: Medicare Other

## 2021-12-31 ENCOUNTER — Ambulatory Visit
Admission: RE | Admit: 2021-12-31 | Discharge: 2021-12-31 | Disposition: A | Discharge status: Home / Self Care | Payer: Medicare Other | Source: Ambulatory Visit | Attending: Radiation Oncology | Admitting: Radiation Oncology

## 2021-12-31 DIAGNOSIS — C50411 Malignant neoplasm of upper-outer quadrant of right female breast: Secondary | ICD-10-CM | POA: Diagnosis not present

## 2022-01-01 ENCOUNTER — Ambulatory Visit (HOSPITAL_COMMUNITY)
Admission: RE | Admit: 2022-01-01 | Discharge: 2022-01-01 | Disposition: A | Discharge status: Home / Self Care | Payer: Medicare Other | Source: Ambulatory Visit | Attending: Family Medicine | Admitting: Family Medicine

## 2022-01-01 ENCOUNTER — Ambulatory Visit (HOSPITAL_BASED_OUTPATIENT_CLINIC_OR_DEPARTMENT_OTHER)
Admission: RE | Admit: 2022-01-01 | Discharge: 2022-01-01 | Disposition: A | Discharge status: Home / Self Care | Payer: Medicare Other | Source: Ambulatory Visit | Attending: Internal Medicine | Admitting: Internal Medicine

## 2022-01-01 ENCOUNTER — Ambulatory Visit: Payer: Medicare Other

## 2022-01-01 ENCOUNTER — Other Ambulatory Visit: Payer: Self-pay

## 2022-01-01 ENCOUNTER — Ambulatory Visit
Admission: RE | Admit: 2022-01-01 | Discharge: 2022-01-01 | Disposition: A | Discharge status: Home / Self Care | Payer: Medicare Other | Source: Ambulatory Visit | Attending: Radiation Oncology | Admitting: Radiation Oncology

## 2022-01-01 ENCOUNTER — Encounter (HOSPITAL_COMMUNITY): Payer: Self-pay | Admitting: Internal Medicine

## 2022-01-01 VITALS — BP 110/78 | HR 73 | Wt 158.6 lb

## 2022-01-01 DIAGNOSIS — Z01818 Encounter for other preprocedural examination: Secondary | ICD-10-CM | POA: Insufficient documentation

## 2022-01-01 DIAGNOSIS — Z803 Family history of malignant neoplasm of breast: Secondary | ICD-10-CM | POA: Diagnosis not present

## 2022-01-01 DIAGNOSIS — R202 Paresthesia of skin: Secondary | ICD-10-CM | POA: Diagnosis not present

## 2022-01-01 DIAGNOSIS — C50411 Malignant neoplasm of upper-outer quadrant of right female breast: Secondary | ICD-10-CM | POA: Diagnosis not present

## 2022-01-01 DIAGNOSIS — Z170 Estrogen receptor positive status [ER+]: Secondary | ICD-10-CM | POA: Insufficient documentation

## 2022-01-01 DIAGNOSIS — Z853 Personal history of malignant neoplasm of breast: Secondary | ICD-10-CM | POA: Diagnosis not present

## 2022-01-01 DIAGNOSIS — Z0189 Encounter for other specified special examinations: Secondary | ICD-10-CM | POA: Diagnosis not present

## 2022-01-01 DIAGNOSIS — Z8669 Personal history of other diseases of the nervous system and sense organs: Secondary | ICD-10-CM | POA: Diagnosis not present

## 2022-01-01 DIAGNOSIS — M7989 Other specified soft tissue disorders: Secondary | ICD-10-CM | POA: Diagnosis not present

## 2022-01-01 DIAGNOSIS — Z1502 Genetic susceptibility to malignant neoplasm of ovary: Secondary | ICD-10-CM | POA: Diagnosis not present

## 2022-01-01 DIAGNOSIS — F32A Depression, unspecified: Secondary | ICD-10-CM | POA: Insufficient documentation

## 2022-01-01 DIAGNOSIS — F419 Anxiety disorder, unspecified: Secondary | ICD-10-CM | POA: Insufficient documentation

## 2022-01-01 DIAGNOSIS — Z923 Personal history of irradiation: Secondary | ICD-10-CM | POA: Insufficient documentation

## 2022-01-01 DIAGNOSIS — Z1501 Genetic susceptibility to malignant neoplasm of breast: Secondary | ICD-10-CM | POA: Insufficient documentation

## 2022-01-01 DIAGNOSIS — M79604 Pain in right leg: Secondary | ICD-10-CM

## 2022-01-01 DIAGNOSIS — I509 Heart failure, unspecified: Secondary | ICD-10-CM | POA: Diagnosis not present

## 2022-01-01 DIAGNOSIS — G43909 Migraine, unspecified, not intractable, without status migrainosus: Secondary | ICD-10-CM | POA: Insufficient documentation

## 2022-01-01 DIAGNOSIS — Z87891 Personal history of nicotine dependence: Secondary | ICD-10-CM | POA: Diagnosis not present

## 2022-01-01 LAB — ECHOCARDIOGRAM COMPLETE
AR max vel: 2.28 cm2
AV Peak grad: 7.7 mmHg
Ao pk vel: 1.39 m/s
Area-P 1/2: 3.93 cm2
Calc EF: 48.6 %
S' Lateral: 2.8 cm
Single Plane A2C EF: 51.2 %
Single Plane A4C EF: 47.9 %

## 2022-01-01 NOTE — Progress Notes (Addendum)
? ?CARDIO-ONCOLOGY CLINIC CONSULT NOTE ? ?Referring Physician: Dr. Lindi Adie ?Primary Care: Luetta Nutting, DO ?Primary Cardiologist: None ? ? ?HPI:  Lori Mcmillan is a 61 y.o. female with h/o tobacco use (quit 2018), migraine HAs and meningioma and right breast cancer referred by Dr. Lindi Adie for enrollment into the Cardio-Oncology program. ? ?2013 Diagnosed with Stage Ia R breast cancer (ER/PR + HER-2 -) treated with lumpectomy and XRT. Tamoxifen x 30 days (stopped due to intolerance ? ?2015 Mengioma resection and XRT ? ?8/22 found to have another R breast CA (ER/PR - HER-2 +). Lumpectomy 9/22. Margins and LNs negative ? ?10/22 Treated with Taxol + Trastuzumab q7d / Trastuzumab q21d x 12 followed by Herceptin maintenance therapy q 21 days. Getting XRT now.  ? ? ?Echo 9/22 EF 60-65% GLS -24% ?Echo 1/23 EF 50-55% GLS -17.7% ?Echo 01/01/22 (today) EF 55-60% GLS -19.7% ? ?Denies any h/o heart problems. Tolerating Herceptin very well. Only real complaint is swelling RLE x 1 week. U/S - DVT. No CP, SOB, palpitations.  ?Walks with her dog daily at least 1/2 mile.  ? ? ?Review of Systems: [y] = yes, _0  = no  ? ?General: Weight gain _1 ; Weight loss _2 ; Anorexia _3 ; Fatigue _4 ; Fever _5 ; Chills _6 ; Weakness _7   ?Cardiac: Chest pain/pressure _8 ; Resting SOB _9 ; Exertional SOB _10 ; Orthopnea _11 ; Pedal Edema [ y]; Palpitations _12 ; Syncope _13 ; Presyncope _14 ; Paroxysmal nocturnal dyspnea_15   ?Pulmonary: Cough _16 ; Wheezing_17 ; Hemoptysis_18 ; Sputum _19 ; Snoring _20   ?GI: Vomiting_21 ; Dysphagia_22 ; Melena_23 ; Hematochezia _24 ; Heartburn_25 ; Abdominal pain _26 ; Constipation _27 ; Diarrhea _28 ; BRBPR _29   ?GU: Hematuria_30 ; Dysuria _31 ; Nocturia_32   ?Vascular: Pain in legs with walking _33 ; Pain in feet with lying flat _34 ; Non-healing sores _35 ; Stroke _36 ; TIA _37 ; Slurred speech _38 ;  ?Neuro: Headaches_39 ; Vertigo_40 ; Seizures_41 ; Paresthesias_42 ;Blurred vision _43 ; Diplopia _44 ; Vision changes _45   ?Ortho/Skin:  Arthritis _46 ; Joint pain _47 ; Muscle pain _48 ; Joint swelling _49 ; Back Pain _50 ; Rash _51   ?Psych: Depression[ y]; Anxiety[ y]  ?Heme: Bleeding problems _52 ; Clotting disorders _53 ; Anemia _54   ?Endocrine: Diabetes _55 ; Thyroid dysfunction_56  ? ? ?Past Medical History:  ?Diagnosis Date  ? Allergy   ? Anxiety   ? occ lorazepam  ? 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 Resected, then RT Feb/Mar 2019.  No sign of residual dz at 04/2018 rad onc f/u and 10/2018 neuro-onc f/u. 03/2019 MRI brain->no resid/no recurrence.  ? Brain embolism and thrombosis   ? Breast cancer ([place])   ? Rt breast; 1.5 cm low grade invasive ductal carcinoma status post lumpectomy with sentinel node biopsy on 01/04/2012.  ? Chicken pox   ? Depression   ? zoloft in past--?wt gain.  ? 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 Eustachian tube dysfunction 09/05/2013  ? Family history of breast cancer   ? Family history of colon cancer   ? Family history of melanoma   ? Family history of ovarian cancer   ? Family history of pancreatic cancer   ? History of radiation therapy 02/24/12-04/11/12  ? right breast/ 45Gy_57 .[id number]/boost=16Gy_58  [id number]  Latest mammo and u/s 03/2013--normal.  ? History of radiation therapy 11/30/17- 01/11/18  ? Right temporal lobe  treated to 55.8 Gy with 31 fx of 1.8 Gy  ? Hx of basal cell carcinoma   ? Migraine   ? Palpitations   ? has taken Metoprolol for palpitations in the past.  ? Seizure ([place])   ? Taste impairment   ? s/p radiation therapy  ? Tobacco dependence 09/05/2013  ? ? ?Current Outpatient Medications  ?Medication Sig Dispense Refill  ? ALPRAZolam (XANAX) 0.25 MG tablet Take 1 tablet (0.25 mg total) by mouth 2 (two) times daily as needed for anxiety. 30 tablet 1  ? diazepam (VALIUM) 2 MG tablet Take 2 mg by mouth at bedtime as needed for anxiety.    ? lidocaine-prilocaine (EMLA) cream Apply to affected area once 30 g 3  ? ondansetron (ZOFRAN) 8 MG tablet Take 1 tablet (8 mg total) by mouth 2 (two) times daily  as needed (Nausea or vomiting). 30 tablet 1  ? predniSONE (DELTASONE) 10 MG tablet Take 10 mg by mouth as needed. For vertigo    ? prochlorperazine (COMPAZINE) 10 MG tablet Take 1 tablet (10 mg total) by mouth every 6 (six) hours as needed (Nausea or vomiting). 30 tablet 1  ? predniSONE (DELTASONE) 10 MG tablet Take 1 tablet (10 mg total) by mouth daily with breakfast. 60 tablet 0  ? ?No current facility-administered medications for this encounter.  ? ? ?No Active Allergies ? ?  ?Social History  ? ?Socioeconomic History  ? Marital status: Single  ?  Spouse name: Not on file  ? Number of children: Not on file  ? Years of education: Not on file  ? Highest education level: Not on file  ?Occupational History  ? Not on file  ?Tobacco Use  ? Smoking status: Former  ?  Packs/day: 0.50  ?  Types: Cigarettes  ?  Quit date: 10/17/2017  ?  Years since quitting: 4.2  ? Smokeless tobacco: Never  ?Vaping Use  ? Vaping Use: Never used  ?Substance and Sexual Activity  ? Alcohol use: Yes  ?  Alcohol/week: 3.0 standard drinks  ?  Types: 3 Cans of beer per week  ?  Comment: 3-4 beers/day  ? Drug use: Not Currently  ? Sexual activity: Not Currently  ?  Partners: Male  ?Other Topics Concern  ? Not on file  ?Social History Narrative  ? Marital status/children/pets: Single.  No children.  Lives alone.  Has pets.Orig from [place] in [place].  ? Education/employment: Bachelor's degree, retired  ? Safety:   ?   -smoke alarm in the home:Yes  ?   - wears seatbelt: Yes  ?   ?   ?   ?   ? ?Social Determinants of Health  ? ?Financial Resource Strain: Not on file  ?Food Insecurity: Not on file  ?Transportation Needs: Not on file  ?Physical Activity: Not on file  ?Stress: Not on file  ?Social Connections: Not on file  ?Intimate Partner Violence: Not on file  ? ? ?  ?Family History  ?Problem Relation Age of Onset  ? Anesthesia problems Mother   ? Breast cancer Mother 2  ?     lumpectomy, chemo, radiation  ? Ovarian cancer Maternal Aunt 70  ? Cancer  Maternal Aunt   ?     unknown type  ? Breast cancer Maternal Aunt 80  ? Breast cancer Maternal Aunt 74  ?     bilateral  ? Pancreatic cancer Maternal Uncle   ?     dx 55s  ? Stomach  cancer Paternal Uncle   ? Cancer Paternal Uncle   ?     unknown type  ? Lung cancer Paternal Uncle   ?     hx smoking  ? Dementia Maternal Grandmother   ? Lung cancer Maternal Grandfather   ?     hx smoking  ? Pancreatic cancer Paternal Grandmother 33  ? Melanoma Paternal Grandmother   ?     dx >50, shin  ? Heart Problems Paternal Grandfather   ? Colon cancer Cousin 54  ?     maternal first cousin  ? Breast cancer Cousin   ?     dx 58s, paternal first cousin  ? ? ?Vitals:  ? 01/01/22 1409  ?BP: 110/78  ?Pulse: 73  ?SpO2: 97%  ?Weight: 71.9 kg (158 lb 9.6 oz)  ? ? ?PHYSICAL EXAM: ?General:  Well appearing. No respiratory difficulty ?HEENT: normal ?Neck: supple. no JVD. Carotids 2+ bilat; no bruits. No lymphadenopathy or thryomegaly appreciated. R port-a-cath ?Cor: PMI nondisplaced. Regular rate & rhythm. No rubs, gallops or murmurs. ?Lungs: clear ?Abdomen: soft, nontender, nondistended. No hepatosplenomegaly. No bruits or masses. Good bowel sounds. ?Extremities: no cyanosis, clubbing, rash, edema ?Neuro: alert & oriented x 3, cranial nerves grossly intact. moves all 4 extremities w/o difficulty. Affect pleasant. ? ? ?ASSESSMENT & PLAN: ? ?1. R Breast Cancer ?- initial R breast CA 2013 ER/PR + HER-2 - s/p lumpectomy and XRT ?- 8/22 found to have another R breast CA (ER/PR - HER-2 +). Lumpectomy 9/22. Margins and LNs negative ?-10/22 Treated with Taxol + Trastuzumab q7d / Trastuzumab q21d x 12 followed by Herceptin maintenance therapy q 21 days.  ?-Echo 9/22 EF 60-65% GLS -24% ?- Echo 1/23 EF 50-55% GLS -17.7% ?- Echo 01/01/22 (today) EF 55-60% GLS -19.7% ?- Explained incidence of Herceptin cardiotoxicity and role of Cardio-oncology clinic at length. Echo images reviewed personally. All parameters stable. Reviewed signs and symptoms of HF to  look for. Continue Herceptin. Follow-up with echo in 3 months. ? ?2. RLE pain and tingling ?- LUE u/s negative for DVT ?- ? Taxol-induce peripheral neuropathy ? ?Glori Bickers, MD  ?2:28 PM ? ?  ?

## 2022-01-01 NOTE — Addendum Note (Signed)
Encounter addended by: Jerl Mina, RN on: 01/01/2022 2:59 PM ? Actions taken: Order list changed, Diagnosis association updated

## 2022-01-04 ENCOUNTER — Ambulatory Visit
Admission: RE | Admit: 2022-01-04 | Discharge: 2022-01-04 | Disposition: A | Discharge status: Home / Self Care | Payer: Medicare Other | Source: Ambulatory Visit | Attending: Radiation Oncology | Admitting: Radiation Oncology

## 2022-01-04 ENCOUNTER — Encounter: Payer: Self-pay | Admitting: Radiation Oncology

## 2022-01-04 ENCOUNTER — Other Ambulatory Visit: Payer: Self-pay

## 2022-01-04 DIAGNOSIS — C50411 Malignant neoplasm of upper-outer quadrant of right female breast: Secondary | ICD-10-CM

## 2022-01-04 MED ORDER — SONAFINE EX EMUL
1.0000 "application " | Freq: Two times a day (BID) | CUTANEOUS | Status: DC
  Administered 2022-01-04: 1 via TOPICAL

## 2022-01-18 NOTE — Progress Notes (Signed)
? ?Patient Care Team: ?Luetta Nutting, DO as PCP - General (Family Medicine) ?Eppie Gibson, MD as Consulting Physician (Radiation Oncology) ?Ventura Sellers, MD as Consulting Physician (Oncology) ?[place], [place] (Ophthalmology) ?Denzil Magnuson, MD as Referring Physician (Dermatology) ?Helayne Seminole, MD as Referring Physician (Otolaryngology) ?Mauro Kaufmann, RN as Oncology Nurse Navigator ?Rockwell Germany, RN as Oncology Nurse Navigator ? ?DIAGNOSIS:  ?Encounter Diagnosis  ?Name Primary?  ? Malignant neoplasm of upper-outer quadrant of right female breast, unspecified estrogen receptor status ([place])   ? ? ?SUMMARY OF ONCOLOGIC HISTORY: ?Oncology History  ?Cancer of upper-outer quadrant of female breast ([place])  ?2013 Initial Diagnosis  ? Right breast cancer: Stage Ia ER/PR positive HER2 negative status postlumpectomy, radiation (low risk Oncotype) could not tolerate tamoxifen for more than 30 days. ?  ?2015 Initial Diagnosis  ? 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  ?05/04/2021 Initial Diagnosis  ? 05/04/2021: Palpable mass in the right breast mammogram revealed 0.8 cm mass and the ultrasound revealed a 1.1 cm mass.  Biopsy revealed grade 3 IDC ER/PR negative HER2 positive with a Ki-67 of 30% ?  ?05/26/2021 Cancer Staging  ? Staging form: Breast, AJCC 7th Edition ?- Clinical stage from 05/26/2021: Stage Unknown (T1c, NX, cM0) - Signed by Eppie Gibson, MD on 05/28/2021 ?Specimen type: Core Needle Biopsy ?Stage prefix: Initial diagnosis ?Laterality: Right ?Tumor grade (Scarff-Bloom-Richardson system): G3 ?Estrogen receptor status: Negative ?Progesterone receptor status: Negative ?HER2 status: Positive ?Staging comments: Staged at Breast Cancer Conference 12/08/11  ? ?  ?06/18/2021 Surgery  ? Right lumpectomy: Grade 3 IDC 1.5 cm, high-grade DCIS, margins negative, 0/2 lymph nodes negative, ER 0%, PR 0%, HER2 3+ positive, Ki-67 30% ?  ? Genetic Testing   ? Negative genetic testing. No pathogenic variants identified on the [facility] Multi-Cancer Panel+RNA. VUS in POLD1 identified called c.2429C>T. The report date is 07/01/2021. ? ?The Multi-Cancer Panel + RNA offered by [facility] includes sequencing and/or deletion duplication testing of the following 84 genes: AIP, ALK, APC, ATM, AXIN2,BAP1,  BARD1, BLM, BMPR1A, BRCA1, BRCA2, BRIP1, CASR, CDC73, CDH1, CDK4, CDKN1B, CDKN1C, CDKN2A (p14ARF), CDKN2A (p16INK4a), CEBPA, CHEK2, CTNNA1, DICER1, DIS3L2, EGFR (c.2369C>T, p.Thr790Met variant only), EPCAM (Deletion/duplication testing only), FH, FLCN, GATA2, GPC3, GREM1 (Promoter region deletion/duplication testing only), HOXB13 (c.251G>A, p.Gly84Glu), HRAS, KIT, MAX, MEN1, MET, MITF (c.952G>A, p.Glu318Lys variant only), MLH1, MSH2, MSH3, MSH6, MUTYH, NBN, NF1, NF2, NTHL1, PALB2, PDGFRA, PHOX2B, PMS2, POLD1, POLE, POT1, PRKAR1A, PTCH1, PTEN, RAD50, RAD51C, RAD51D, RB1, RECQL4, RET, RUNX1, SDHAF2, SDHA (sequence changes only), SDHB, SDHC, SDHD, SMAD4, SMARCA4, SMARCB1, SMARCE1, STK11, SUFU, TERC, TERT, TMEM127, TP53, TSC1, TSC2, VHL, WRN and WT1. ?  ?07/21/2021 -  Chemotherapy  ? Patient is on Treatment Plan : BREAST Paclitaxel + Trastuzumab q7d / Trastuzumab q21d  ?   ?11/25/2021 - 01/04/2022 Radiation Therapy  ? Adj Radiation with Dr.Squire ?  ? ? ?CHIEF COMPLIANT: Follow-up on Herceptin maintenance ? ?INTERVAL HISTORY: Lori Mcmillan is a  61 y.o. with above-mentioned history of  HER2 positive breast cancer finished 12 cycles of Taxol Herceptin, currently on radiation therapy. She presents to the clinic today for Herceptin maintenance. She is tolerating the herceptin. Complains of tightness in the right leg, but she states that it is better now. Denies numbness and tingling. ? ? ?ALLERGIES:  has no active allergies. ? ?MEDICATIONS:  ?Current Outpatient Medications  ?Medication Sig Dispense Refill  ? ALPRAZolam (XANAX) 0.25 MG tablet Take 1 tablet (0.25  mg total) by mouth 2 (two)  times daily as needed for anxiety. 30 tablet 1  ? diazepam (VALIUM) 2 MG tablet Take 2 mg by mouth at bedtime as needed for anxiety.    ? lidocaine-prilocaine (EMLA) cream Apply to affected area once 30 g 3  ? predniSONE (DELTASONE) 10 MG tablet Take 1 tablet (10 mg total) by mouth daily with breakfast. 60 tablet 0  ? predniSONE (DELTASONE) 10 MG tablet Take 10 mg by mouth as needed. For vertigo    ? ?No current facility-administered medications for this visit.  ? ? ?PHYSICAL EXAMINATION: ?ECOG PERFORMANCE STATUS: 1 - Symptomatic but completely ambulatory ? ?Vitals:  ? 01/19/22 0935  ?BP: 136/85  ?Pulse: 84  ?Resp: 18  ?Temp: (!) 97.2 ?F (36.2 ?C)  ?SpO2: 100%  ? ?Filed Weights  ? 01/19/22 0935  ?Weight: 158 lb 3.2 oz (71.8 kg)  ? ?  ? ?LABORATORY DATA:  ?I have reviewed the data as listed ? ?  Latest Ref Rng & Units 12/08/2021  ?  9:39 AM 10/27/2021  ? 11:03 AM 10/06/2021  ?  9:10 AM  ?CMP  ?Glucose 70 - 99 mg/dL 94   92   89    ?BUN 6 - 20 mg/dL _0 ?Creatinine 0.44 - 1.00 mg/dL 0.62   0.66   0.65    ?Sodium 135 - 145 mmol/L 138   137   137    ?Potassium 3.5 - 5.1 mmol/L 4.0   3.9   4.1    ?Chloride 98 - 111 mmol/L 107   105   106    ?CO2 22 - 32 mmol/L _1 ?Calcium 8.9 - 10.3 mg/dL 9.0   8.8   8.8    ?Total Protein 6.5 - 8.1 g/dL 6.8   6.7   6.6    ?Total Bilirubin 0.3 - 1.2 mg/dL 0.3   0.3   0.3    ?Alkaline Phos 38 - 126 U/L 73   79   77    ?AST 15 - 41 U/L _2 ?ALT 0 - 44 U/L _3 ? ? ?Lab Results  ?Component Value Date  ? WBC 6.4 01/19/2022  ? HGB 11.6 (L) 01/19/2022  ? HCT 36.1 01/19/2022  ? MCV 90.3 01/19/2022  ? PLT 259 01/19/2022  ? NEUTROABS 3.9 01/19/2022  ? ? ?ASSESSMENT & PLAN:  ?Cancer of upper-outer quadrant of female breast ([place]) ?06/18/2021:Right lumpectomy: Grade 3 IDC 1.5 cm, high-grade DCIS, margins negative, 0/2 lymph nodes negative, ER 0%, PR 0%, HER2 3+ positive, Ki-67 30%  ?  ?(2013 Right breast cancer: Stage Ia ER/PR positive HER2 negative status  postlumpectomy, radiation (low risk Oncotype) could not tolerate tamoxifen for more than 30 days.) ?  ?Treatment plan: ?1.  Adjuvant chemotherapy with Taxol and Herceptin followed by Herceptin maintenance ?2. breast radiation completed 01/04/2022 ?----------------------------------------------------------------------------------------------------------------------------------- ?Current treatment: Completed 12 cycles of Taxol Herceptin, currently on Herceptin maintenance ?Echocardiogram: EF 50-55% (okay to treat)   ?  ?Chemo toxicities: ?Fatigue: Much improved ?2. Chemotherapy-induced anemia: Today's hemoglobin is 11.6 ?  ?Return to clinic every 3 weeks for Herceptin every 6 weeks with follow-up with me ? ? ? ?No orders of the defined types were placed in this encounter. ? ?The patient has a good understanding of the overall plan. she agrees  with it. she will call with any problems that may develop before the next visit here. ?Total time spent: 30 mins including face to face time and time spent for planning, charting and co-ordination of care ? ? Harriette Ohara, MD ?01/19/22 ? ? ? I Gardiner Coins am scribing for Dr. Lindi Adie ? ?I have reviewed the above documentation for accuracy and completeness, and I agree with the above. ?  ?

## 2022-01-19 ENCOUNTER — Inpatient Hospital Stay: Payer: Medicare Other | Attending: Internal Medicine

## 2022-01-19 ENCOUNTER — Other Ambulatory Visit: Payer: Self-pay

## 2022-01-19 ENCOUNTER — Inpatient Hospital Stay: Payer: Medicare Other

## 2022-01-19 ENCOUNTER — Inpatient Hospital Stay (HOSPITAL_BASED_OUTPATIENT_CLINIC_OR_DEPARTMENT_OTHER): Payer: Medicare Other | Admitting: Hematology and Oncology

## 2022-01-19 DIAGNOSIS — Z9221 Personal history of antineoplastic chemotherapy: Secondary | ICD-10-CM | POA: Diagnosis not present

## 2022-01-19 DIAGNOSIS — Z923 Personal history of irradiation: Secondary | ICD-10-CM | POA: Insufficient documentation

## 2022-01-19 DIAGNOSIS — D6481 Anemia due to antineoplastic chemotherapy: Secondary | ICD-10-CM | POA: Insufficient documentation

## 2022-01-19 DIAGNOSIS — C50411 Malignant neoplasm of upper-outer quadrant of right female breast: Secondary | ICD-10-CM | POA: Diagnosis not present

## 2022-01-19 DIAGNOSIS — Z170 Estrogen receptor positive status [ER+]: Secondary | ICD-10-CM | POA: Diagnosis not present

## 2022-01-19 DIAGNOSIS — Z5112 Encounter for antineoplastic immunotherapy: Secondary | ICD-10-CM | POA: Insufficient documentation

## 2022-01-19 DIAGNOSIS — Z79899 Other long term (current) drug therapy: Secondary | ICD-10-CM | POA: Diagnosis not present

## 2022-01-19 DIAGNOSIS — Z7952 Long term (current) use of systemic steroids: Secondary | ICD-10-CM | POA: Diagnosis not present

## 2022-01-19 DIAGNOSIS — T451X5A Adverse effect of antineoplastic and immunosuppressive drugs, initial encounter: Secondary | ICD-10-CM | POA: Diagnosis not present

## 2022-01-19 DIAGNOSIS — Z95828 Presence of other vascular implants and grafts: Secondary | ICD-10-CM

## 2022-01-19 LAB — CMP (CANCER CENTER ONLY)
ALT: 13 U/L (ref 0–44)
AST: 12 U/L — ABNORMAL LOW (ref 15–41)
Albumin: 4.1 g/dL (ref 3.5–5.0)
Alkaline Phosphatase: 71 U/L (ref 38–126)
Anion gap: 6 (ref 5–15)
BUN: 15 mg/dL (ref 6–20)
CO2: 25 mmol/L (ref 22–32)
Calcium: 9.2 mg/dL (ref 8.9–10.3)
Chloride: 107 mmol/L (ref 98–111)
Creatinine: 0.69 mg/dL (ref 0.44–1.00)
GFR, Estimated: >60 mL/min (ref >60)
Glucose, Bld: 89 mg/dL (ref 70–99)
Potassium: 3.8 mmol/L (ref 3.5–5.1)
Sodium: 138 mmol/L (ref 135–145)
Total Bilirubin: 0.3 mg/dL (ref 0.3–1.2)
Total Protein: 6.9 g/dL (ref 6.5–8.1)

## 2022-01-19 LAB — CBC WITH DIFFERENTIAL (CANCER CENTER ONLY)
Abs Immature Granulocytes: 0.01 10*3/uL (ref 0.00–0.07)
Basophils Absolute: 0.1 10*3/uL (ref 0.0–0.1)
Basophils Relative: 1 %
Eosinophils Absolute: 0.1 10*3/uL (ref 0.0–0.5)
Eosinophils Relative: 2 %
HCT: 36.1 % (ref 36.0–46.0)
Hemoglobin: 11.6 g/dL — ABNORMAL LOW (ref 12.0–15.0)
Immature Granulocytes: 0 %
Lymphocytes Relative: 26 %
Lymphs Abs: 1.6 10*3/uL (ref 0.7–4.0)
MCH: 29 pg (ref 26.0–34.0)
MCHC: 32.1 g/dL (ref 30.0–36.0)
MCV: 90.3 fL (ref 80.0–100.0)
Monocytes Absolute: 0.6 10*3/uL (ref 0.1–1.0)
Monocytes Relative: 10 %
Neutro Abs: 3.9 10*3/uL (ref 1.7–7.7)
Neutrophils Relative %: 61 %
Platelet Count: 259 10*3/uL (ref 150–400)
RBC: 4 MIL/uL (ref 3.87–5.11)
RDW: 12.7 % (ref 11.5–15.5)
WBC Count: 6.4 10*3/uL (ref 4.0–10.5)
nRBC: 0 % (ref 0.0–0.2)

## 2022-01-19 MED ORDER — SODIUM CHLORIDE 0.9 % IV SOLN: Freq: Once | INTRAVENOUS | Status: AC

## 2022-01-19 MED ORDER — TRASTUZUMAB-ANNS CHEMO 150 MG IV SOLR
6.0000 mg/kg | Freq: Once | INTRAVENOUS | Status: AC
  Administered 2022-01-19: 441 mg via INTRAVENOUS
  Filled 2022-01-19: qty 21

## 2022-01-19 MED ORDER — SODIUM CHLORIDE 0.9% FLUSH
10.0000 mL | Freq: Once | INTRAVENOUS | Status: AC
  Administered 2022-01-19: 10 mL

## 2022-01-19 MED ORDER — DIPHENHYDRAMINE HCL 25 MG PO CAPS
25.0000 mg | ORAL_CAPSULE | Freq: Once | ORAL | Status: AC
  Administered 2022-01-19: 25 mg via ORAL
  Filled 2022-01-19: qty 1

## 2022-01-19 MED ORDER — ACETAMINOPHEN 325 MG PO TABS
650.0000 mg | ORAL_TABLET | Freq: Once | ORAL | Status: AC
  Administered 2022-01-19: 650 mg via ORAL
  Filled 2022-01-19: qty 2

## 2022-01-19 MED ORDER — SODIUM CHLORIDE 0.9% FLUSH
10.0000 mL | INTRAVENOUS | Status: DC | PRN
  Administered 2022-01-19: 10 mL

## 2022-01-19 MED ORDER — HEPARIN SOD (PORK) LOCK FLUSH 100 UNIT/ML IV SOLN
500.0000 [IU] | Freq: Once | INTRAVENOUS | Status: AC | PRN
  Administered 2022-01-19: 500 [IU]

## 2022-01-19 NOTE — Patient Instructions (Signed)
[facility]  Discharge Instructions: ?Thank you for choosing [facility] to provide your oncology and hematology care.  ? ?If you have a lab appointment with the Cancer Center, please go directly to the Cancer Center and check in at the registration area. ?  ?Wear comfortable clothing and clothing appropriate for easy access to any Portacath or PICC line.  ? ?We strive to give you quality time with your provider. You may need to reschedule your appointment if you arrive late (15 or more minutes).  Arriving late affects you and other patients whose appointments are after yours.  Also, if you miss three or more appointments without notifying the office, you may be dismissed from the clinic at the provider?s discretion.    ?  ?For prescription refill requests, have your pharmacy contact our office and allow 72 hours for refills to be completed.   ? ?Today you received the following chemotherapy and/or immunotherapy agents: Trastuzumab    ?  ?To help prevent nausea and vomiting after your treatment, we encourage you to take your nausea medication as directed. ? ?BELOW ARE SYMPTOMS THAT SHOULD BE REPORTED IMMEDIATELY: ?*FEVER GREATER THAN 100.4 F (38 ?C) OR HIGHER ?*CHILLS OR SWEATING ?*NAUSEA AND VOMITING THAT IS NOT CONTROLLED WITH YOUR NAUSEA MEDICATION ?*UNUSUAL SHORTNESS OF BREATH ?*UNUSUAL BRUISING OR BLEEDING ?*URINARY PROBLEMS (pain or burning when urinating, or frequent urination) ?*BOWEL PROBLEMS (unusual diarrhea, constipation, pain near the anus) ?TENDERNESS IN MOUTH AND THROAT WITH OR WITHOUT PRESENCE OF ULCERS (sore throat, sores in mouth, or a toothache) ?UNUSUAL RASH, SWELLING OR PAIN  ?UNUSUAL VAGINAL DISCHARGE OR ITCHING  ? ?Items with * indicate a potential emergency and should be followed up as soon as possible or go to the Emergency Department if any problems should occur. ? ?Please show the CHEMOTHERAPY ALERT CARD or IMMUNOTHERAPY ALERT CARD at check-in  to the Emergency Department and triage nurse. ? ?Should you have questions after your visit or need to cancel or reschedule your appointment, please contact [facility]  Dept: [phone]  and follow the prompts.  Office hours are 8:00 a.m. to 4:30 p.m. Monday - Friday. Please note that voicemails left after 4:00 p.m. may not be returned until the following business day.  We are closed weekends and major holidays. You have access to a nurse at all times for urgent questions. Please call the main number to the clinic Dept: [phone] and follow the prompts. ? ? ?For any non-urgent questions, you may also contact your provider using MyChart. We now offer e-Visits for anyone 18 and older to request care online for non-urgent symptoms. For details visit [email or web address]. ?  ?Also download the MyChart app! Go to the app store, search "MyChart", open the app, select [facility], and log in with your MyChart username and password. ? ?Due to Covid, a mask is required upon entering the hospital/clinic. If you do not have a mask, one will be given to you upon arrival. For doctor visits, patients may have 1 support person aged 18 or older with them. For treatment visits, patients cannot have anyone with them due to current Covid guidelines and our immunocompromised population.  ? ?

## 2022-01-19 NOTE — Assessment & Plan Note (Signed)
06/18/2021:Right lumpectomy: Grade 3 IDC 1.5 cm, high-grade DCIS, margins negative, 0/2 lymph nodes negative, ER 0%, PR 0%, HER2 3+ positive, Ki-67 30%? ?? ?(2013?Right breast cancer: Stage Ia ER/PR positive HER2 negative status postlumpectomy, radiation (low risk Oncotype) could not tolerate tamoxifen for more than 30 days.) ?? ?Treatment plan: ?1.??Adjuvant chemotherapy with Taxol and Herceptin followed by Herceptin maintenance ?2.?discussion regarding pros and cons of reirradiation?(Dr. Isidore Moos will need to inform?us if she is eligible for radiation given her prior history of radiation therapy to the breast) ?----------------------------------------------------------------------------------------------------------------------------------- ?Current treatment:?Completed 12 cycles of?Taxol Herceptin, currently on Herceptin maintenance ?Echocardiogram: EF?50-55%?(okay to treat) sent a referral to Dr. Haroldine Laws ?? ?Chemo toxicities: ?1. Fatigue: She attributes this to radiation. ?2.?Chemotherapy-induced anemia: Today's hemoglobin is 11.4 ?3.??Diarrhea:?No issues with diarrhea clinic ?  ?Return to clinic?every 3 weeks for Herceptin every 6 weeks with follow-up with me ?

## 2022-01-20 DIAGNOSIS — H1132 Conjunctival hemorrhage, left eye: Secondary | ICD-10-CM | POA: Diagnosis not present

## 2022-02-03 ENCOUNTER — Encounter (HOSPITAL_COMMUNITY): Payer: Self-pay

## 2022-02-09 ENCOUNTER — Other Ambulatory Visit: Payer: Self-pay

## 2022-02-09 ENCOUNTER — Encounter: Payer: Self-pay | Admitting: Radiation Oncology

## 2022-02-09 ENCOUNTER — Inpatient Hospital Stay: Payer: Medicare Other

## 2022-02-09 ENCOUNTER — Ambulatory Visit
Admission: RE | Admit: 2022-02-09 | Discharge: 2022-02-09 | Disposition: A | Discharge status: Home / Self Care | Payer: Medicare Other | Source: Ambulatory Visit | Attending: Radiation Oncology | Admitting: Radiation Oncology

## 2022-02-09 VITALS — BP 139/70 | HR 58 | Temp 96.6°F | Resp 18 | Ht 63.0 in | Wt 157.0 lb

## 2022-02-09 VITALS — BP 129/84 | HR 69 | Temp 98.0°F | Resp 18 | Wt 154.6 lb

## 2022-02-09 DIAGNOSIS — C50411 Malignant neoplasm of upper-outer quadrant of right female breast: Secondary | ICD-10-CM

## 2022-02-09 DIAGNOSIS — T451X5A Adverse effect of antineoplastic and immunosuppressive drugs, initial encounter: Secondary | ICD-10-CM | POA: Diagnosis not present

## 2022-02-09 DIAGNOSIS — D6481 Anemia due to antineoplastic chemotherapy: Secondary | ICD-10-CM | POA: Diagnosis not present

## 2022-02-09 DIAGNOSIS — Z5112 Encounter for antineoplastic immunotherapy: Secondary | ICD-10-CM | POA: Diagnosis not present

## 2022-02-09 DIAGNOSIS — Z7952 Long term (current) use of systemic steroids: Secondary | ICD-10-CM | POA: Insufficient documentation

## 2022-02-09 DIAGNOSIS — Z170 Estrogen receptor positive status [ER+]: Secondary | ICD-10-CM | POA: Diagnosis not present

## 2022-02-09 DIAGNOSIS — Z9221 Personal history of antineoplastic chemotherapy: Secondary | ICD-10-CM | POA: Diagnosis not present

## 2022-02-09 DIAGNOSIS — Z79899 Other long term (current) drug therapy: Secondary | ICD-10-CM | POA: Diagnosis not present

## 2022-02-09 DIAGNOSIS — Z923 Personal history of irradiation: Secondary | ICD-10-CM | POA: Insufficient documentation

## 2022-02-09 MED ORDER — DIPHENHYDRAMINE HCL 25 MG PO CAPS
25.0000 mg | ORAL_CAPSULE | Freq: Once | ORAL | Status: AC
  Administered 2022-02-09: 25 mg via ORAL
  Filled 2022-02-09: qty 1

## 2022-02-09 MED ORDER — ACETAMINOPHEN 325 MG PO TABS
650.0000 mg | ORAL_TABLET | Freq: Once | ORAL | Status: AC
  Administered 2022-02-09: 650 mg via ORAL
  Filled 2022-02-09: qty 2

## 2022-02-09 MED ORDER — TRASTUZUMAB-ANNS CHEMO 150 MG IV SOLR
6.0000 mg/kg | Freq: Once | INTRAVENOUS | Status: AC
  Administered 2022-02-09: 441 mg via INTRAVENOUS
  Filled 2022-02-09: qty 21

## 2022-02-09 MED ORDER — SODIUM CHLORIDE 0.9 % IV SOLN: Freq: Once | INTRAVENOUS | Status: AC

## 2022-02-09 NOTE — Progress Notes (Signed)
?  Radiation Oncology         [phone] ?________________________________ ? ?Name: Lori Mcmillan MRN: [id number]  ?Date: 02/09/2022  DOB: [birth year] ? ?Follow-Up Visit Note ? ?Outpatient ? ?CC: Luetta Nutting, DO  Nicholas Lose, MD ? ?Diagnosis and Prior Radiotherapy:  ?  ICD-10-CM   ?1. Malignant neoplasm of upper-outer quadrant of right female breast, unspecified estrogen receptor status ([place])  C50.411   ?  ? ? ?CHIEF COMPLAINT: Here for follow-up and surveillance of breast cancer ? ?Narrative:  The patient returns today for routine follow-up.    She completed reirradiation of the right breast last month and she is pleased with how she has healed ? ? Breast Side: RT ? ?They completed their radiation on: March 20th, 2023  ? ?Does the patient complain of any of the following: ?Post radiation skin issues: No ?Breast Tenderness: None ?Breast Swelling: None ?Lymphadema: None ?Range of Motion limitations: Full ROM bilaterally ?Fatigue post radiation: Mild on occasion. ?Appetite good/fair/poor: Fair ? ?Additional comments if applicable: None                             ? ?ALLERGIES:  has no active allergies. ? ?Meds: ?Current Outpatient Medications  ?Medication Sig Dispense Refill  ? ALPRAZolam (XANAX) 0.25 MG tablet Take 1 tablet (0.25 mg total) by mouth 2 (two) times daily as needed for anxiety. 30 tablet 1  ? diazepam (VALIUM) 2 MG tablet Take 2 mg by mouth at bedtime as needed for anxiety.    ? lidocaine-prilocaine (EMLA) cream Apply to affected area once 30 g 3  ? predniSONE (DELTASONE) 10 MG tablet Take 1 tablet (10 mg total) by mouth daily with breakfast. 60 tablet 0  ? predniSONE (DELTASONE) 10 MG tablet Take 10 mg by mouth as needed. For vertigo    ? ?No current facility-administered medications for this encounter.  ? ? ?Physical Findings: ?The patient is in no acute distress. Patient is alert and oriented. ? height is '5\' 3"'$  (1.6 m) and weight is 157 lb (71.2 kg). Her temporal temperature is 96.6 ?F (35.9  ?C) (abnormal). Her blood pressure is 139/70 and her pulse is 58 (abnormal). Her respiration is 18 and oxygen saturation is 100%. Marland Kitchen    ?Satisfactory skin healing in radiotherapy fields.  The right breast has smooth skin with minimal residual erythema and no residual dryness ? ? ?Lab Findings: ?Lab Results  ?Component Value Date  ? WBC 6.4 01/19/2022  ? HGB 11.6 (L) 01/19/2022  ? HCT 36.1 01/19/2022  ? MCV 90.3 01/19/2022  ? PLT 259 01/19/2022  ? ? ?Radiographic Findings: ?No results found. ? ?Impression/Plan: Healing well from radiotherapy to the right breast tissue.  1 month posttreatment her breast has healed well after reirradiation ? ?Continue skin care with topical Vitamin E Oil and / or lotion for at least 2 more months for further healing. ? ?I encouraged her to continue with yearly mammography as appropriate (for intact breast tissue) and followup with medical oncology. I will see her back on an as-needed basis. I have encouraged her to call if she has any issues or concerns in the future. I wished her the very best. ? ?On date of service, in total, I spent 15 minutes on this encounter. Patient was seen in person.  ?_____________________________________ ? ? ?Eppie Gibson, MD  ?

## 2022-02-09 NOTE — Progress Notes (Addendum)
Lori Mcmillan is here today for follow up post radiation to the breast. ? ? Breast Side: RT ? ?They completed their radiation on: March 20th, 2023  ? ?Does the patient complain of any of the following: ?Post radiation skin issues: No ?Breast Tenderness: None ?Breast Swelling: None ?Lymphadema: None ?Range of Motion limitations: Full ROM bilaterally ?Fatigue post radiation: Mild on occasion. ?Appetite good/fair/poor: Fair ? ?Additional comments if applicable: None ? ?BP 139/70 (BP Location: Left Arm, Patient Position: Sitting, Cuff Size: Normal)   Pulse (!) 58   Temp (!) 96.6 ?F (35.9 ?C) (Temporal)   Resp 18   Ht '5\' 3"'$  (1.6 m)   Wt 157 lb (71.2 kg)   LMP 05/11/2013   SpO2 100%   BMI 27.81 kg/m?  ? ?

## 2022-02-16 ENCOUNTER — Encounter: Payer: Self-pay | Admitting: Hematology and Oncology

## 2022-02-16 NOTE — Progress Notes (Signed)
? ?Patient Care Team: ?Luetta Nutting, DO as PCP - General (Family Medicine) ?Eppie Gibson, MD as Consulting Physician (Radiation Oncology) ?Ventura Sellers, MD as Consulting Physician (Oncology) ?Ragland, [place] (Ophthalmology) ?Denzil Magnuson, MD as Referring Physician (Dermatology) ?Helayne Seminole, MD as Referring Physician (Otolaryngology) ?Mauro Kaufmann, RN as Oncology Nurse Navigator ?Rockwell Germany, RN as Oncology Nurse Navigator ? ?DIAGNOSIS:  ?Encounter Diagnosis  ?Name Primary?  ? Malignant neoplasm of upper-outer quadrant of right female breast, unspecified estrogen receptor status ([place])   ? ? ?SUMMARY OF ONCOLOGIC HISTORY: ?Oncology History  ?Cancer of upper-outer quadrant of female breast ([place])  ?2013 Initial Diagnosis  ? Right breast cancer: Stage Ia ER/PR positive HER2 negative status postlumpectomy, radiation (low risk Oncotype) could not tolerate tamoxifen for more than 30 days. ?  ?2015 Initial Diagnosis  ? 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  ?05/04/2021 Initial Diagnosis  ? 05/04/2021: Palpable mass in the right breast mammogram revealed 0.8 cm mass and the ultrasound revealed a 1.1 cm mass.  Biopsy revealed grade 3 IDC ER/PR negative HER2 positive with a Ki-67 of 30% ?  ?05/26/2021 Cancer Staging  ? Staging form: Breast, AJCC 7th Edition ?- Clinical stage from 05/26/2021: Stage Unknown (T1c, NX, cM0) - Signed by Eppie Gibson, MD on 05/28/2021 ?Specimen type: Core Needle Biopsy ?Stage prefix: Initial diagnosis ?Laterality: Right ?Tumor grade (Scarff-Bloom-Richardson system): G3 ?Estrogen receptor status: Negative ?Progesterone receptor status: Negative ?HER2 status: Positive ?Staging comments: Staged at Breast Cancer Conference 12/08/11  ? ?  ?06/18/2021 Surgery  ? Right lumpectomy: Grade 3 IDC 1.5 cm, high-grade DCIS, margins negative, 0/2 lymph nodes negative, ER 0%, PR 0%, HER2 3+ positive, Ki-67 30% ?  ? Genetic Testing   ? Negative genetic testing. No pathogenic variants identified on the [facility] Multi-Cancer Panel+RNA. VUS in POLD1 identified called c.2429C>T. The report date is 07/01/2021. ? ?The Multi-Cancer Panel + RNA offered by [facility] includes sequencing and/or deletion duplication testing of the following 84 genes: AIP, ALK, APC, ATM, AXIN2,BAP1,  BARD1, BLM, BMPR1A, BRCA1, BRCA2, BRIP1, CASR, CDC73, CDH1, CDK4, CDKN1B, CDKN1C, CDKN2A (p14ARF), CDKN2A (p16INK4a), CEBPA, CHEK2, CTNNA1, DICER1, DIS3L2, EGFR (c.2369C>T, p.Thr790Met variant only), EPCAM (Deletion/duplication testing only), FH, FLCN, GATA2, GPC3, GREM1 (Promoter region deletion/duplication testing only), HOXB13 (c.251G>A, p.Gly84Glu), HRAS, KIT, MAX, MEN1, MET, MITF (c.952G>A, p.Glu318Lys variant only), MLH1, MSH2, MSH3, MSH6, MUTYH, NBN, NF1, NF2, NTHL1, PALB2, PDGFRA, PHOX2B, PMS2, POLD1, POLE, POT1, PRKAR1A, PTCH1, PTEN, RAD50, RAD51C, RAD51D, RB1, RECQL4, RET, RUNX1, SDHAF2, SDHA (sequence changes only), SDHB, SDHC, SDHD, SMAD4, SMARCA4, SMARCB1, SMARCE1, STK11, SUFU, TERC, TERT, TMEM127, TP53, TSC1, TSC2, VHL, WRN and WT1. ?  ?07/21/2021 -  Chemotherapy  ? Patient is on Treatment Plan : BREAST Paclitaxel + Trastuzumab q7d / Trastuzumab q21d  ? ?   ?11/25/2021 - 01/04/2022 Radiation Therapy  ? Adj Radiation with Dr.Squire ?  ? ? ?CHIEF COMPLIANT: Follow-up on Herceptin maintenance ? ?INTERVAL HISTORY: Lori Mcmillan is a 61 y.o. with above-mentioned history of  HER2 positive breast cancer finished 12 cycles of Taxol Herceptin, currently on radiation therapy. She presents to the clinic today for Herceptin maintenance.  ? ? ?ALLERGIES:  has no active allergies. ? ?MEDICATIONS:  ?Current Outpatient Medications  ?Medication Sig Dispense Refill  ? ALPRAZolam (XANAX) 0.25 MG tablet Take 1 tablet (0.25 mg total) by mouth 2 (two) times daily as needed for anxiety. 30 tablet 1  ? diazepam (VALIUM) 2 MG tablet Take  2 mg by mouth at bedtime as needed for anxiety.    ?  lidocaine-prilocaine (EMLA) cream Apply to affected area once 30 g 3  ? predniSONE (DELTASONE) 10 MG tablet Take 1 tablet (10 mg total) by mouth daily with breakfast. 60 tablet 0  ? predniSONE (DELTASONE) 10 MG tablet Take 10 mg by mouth as needed. For vertigo    ? ?No current facility-administered medications for this visit.  ? ? ?PHYSICAL EXAMINATION: ?ECOG PERFORMANCE STATUS: 1 - Symptomatic but completely ambulatory ? ?Vitals:  ? 03/02/22 1020  ?BP: 129/77  ?Pulse: 66  ?Resp: 18  ?Temp: (!) 97.3 ?F (36.3 ?C)  ?SpO2: 100%  ? ?Filed Weights  ? 03/02/22 1020  ?Weight: 155 lb 3.2 oz (70.4 kg)  ? ?  ? ?LABORATORY DATA:  ?I have reviewed the data as listed ? ?  Latest Ref Rng & Units 01/19/2022  ?  9:29 AM 12/08/2021  ?  9:39 AM 10/27/2021  ? 11:03 AM  ?CMP  ?Glucose 70 - 99 mg/dL 89   94   92    ?BUN 6 - 20 mg/dL $Remove'15   16   15    'AqvHbDl$ ?Creatinine 0.44 - 1.00 mg/dL 0.69   0.62   0.66    ?Sodium 135 - 145 mmol/L 138   138   137    ?Potassium 3.5 - 5.1 mmol/L 3.8   4.0   3.9    ?Chloride 98 - 111 mmol/L 107   107   105    ?CO2 22 - 32 mmol/L $RemoveB'25   27   25    'bhTXswJe$ ?Calcium 8.9 - 10.3 mg/dL 9.2   9.0   8.8    ?Total Protein 6.5 - 8.1 g/dL 6.9   6.8   6.7    ?Total Bilirubin 0.3 - 1.2 mg/dL 0.3   0.3   0.3    ?Alkaline Phos 38 - 126 U/L 71   73   79    ?AST 15 - 41 U/L $Remo'12   21   15    'mKBlB$ ?ALT 0 - 44 U/L $Remo'13   18   18    'EILkY$ ? ? ?Lab Results  ?Component Value Date  ? WBC 8.0 03/02/2022  ? HGB 11.3 (L) 03/02/2022  ? HCT 34.4 (L) 03/02/2022  ? MCV 88.7 03/02/2022  ? PLT 285 03/02/2022  ? NEUTROABS 5.1 03/02/2022  ? ? ?ASSESSMENT & PLAN:  ?Cancer of upper-outer quadrant of female breast ([place]) ?06/18/2021:Right lumpectomy: Grade 3 IDC 1.5 cm, high-grade DCIS, margins negative, 0/2 lymph nodes negative, ER 0%, PR 0%, HER2 3+ positive, Ki-67 30%  ?  ?(2013 Right breast cancer: Stage Ia ER/PR positive HER2 negative status postlumpectomy, radiation (low risk Oncotype) could not tolerate tamoxifen for more than 30 days.) ?  ?Treatment plan: ?1.  Adjuvant  chemotherapy with Taxol and Herceptin followed by Herceptin maintenance ?2. breast radiation completed 01/04/2022 ?----------------------------------------------------------------------------------------------------------------------------------- ?Current treatment: Completed 12 cycles of Taxol Herceptin, currently on Herceptin maintenance (last treatment 07/06/2022) ?Echocardiogram: EF 50-55% (okay to treat)   ?  ?Chemo toxicities: ?Fatigue: Much improved ?2. Chemotherapy-induced anemia: Today's hemoglobin is 11.3 ?  ?Return to clinic every 3 weeks for Herceptin every 6 weeks with follow-up with me ? ? ? ?No orders of the defined types were placed in this encounter. ? ?The patient has a good understanding of the overall plan. she agrees with it. she will call with any problems that may develop before the next visit here. ?Total time spent:  30 mins including face to face time and time spent for planning, charting and co-ordination of care ? ? Harriette Ohara, MD ?03/02/22 ? ? ? I Gardiner Coins am scribing for Dr. Lindi Adie ? ?I have reviewed the above documentation for accuracy and completeness, and I agree with the above. ?  ?

## 2022-02-16 NOTE — Progress Notes (Signed)
? ?                                                                                                                                                          ?  Patient Name: Lori Mcmillan ?MRN: [id number] ?DOB: 1961-07-09 ?Referring Physician: Nicholas Lose (Profile Not Attached) ?Date of Service: 01/04/2022 ?[facility] [place] ? ?                                                      End Of Treatment Note ? ?Diagnoses: C50.411-Malignant neoplasm of upper-outer quadrant of right female breast ? ?Cancer Staging:  Cancer Staging  ?Cancer of upper-outer quadrant of female breast ([place]) ?Staging form: Breast, AJCC 7th Edition ?- Pathologic: No stage assigned - Unsigned ?Specimen type: Core Needle Biopsy ?Laterality: Right ?- Clinical stage from 05/26/2021: Stage Unknown (T1c, NX, cM0) - Signed by Eppie Gibson, MD on 05/28/2021 ?Specimen type: Core Needle Biopsy ?Stage prefix: Initial diagnosis ?Laterality: Right ?Tumor grade (Scarff-Bloom-Richardson system): G3 ?Estrogen receptor status: Negative ?Progesterone receptor status: Negative ?HER2 status: Positive ?Staging comments: Staged at Breast Cancer Conference 12/08/11  ? ? ? ?pT1c, pN0 ? ?Intent: Curative ? ?Radiation Treatment Dates: 11/24/2021 through 01/04/2022 ?Site Technique Total Dose (Gy) Dose per Fx (Gy) Completed Fx Beam Energies  ?Breast, Right: Breast_R 3D 45/45 1.8 25/25 6XFFF  ?Breast, Right: Breast_R_Bst specialPort 5.4/5.4 1.8 3/3 12E  ? ?Narrative: The patient tolerated radiation therapy relatively well.  ? ?Plan: The patient will follow-up with radiation oncology in 71mo . ?----------------------------------- ? ?Eppie Gibson, MD ? ?

## 2022-03-02 ENCOUNTER — Inpatient Hospital Stay: Payer: Medicare Other | Attending: Internal Medicine

## 2022-03-02 ENCOUNTER — Inpatient Hospital Stay: Payer: Medicare Other

## 2022-03-02 ENCOUNTER — Inpatient Hospital Stay (HOSPITAL_BASED_OUTPATIENT_CLINIC_OR_DEPARTMENT_OTHER): Payer: Medicare Other | Admitting: Hematology and Oncology

## 2022-03-02 ENCOUNTER — Other Ambulatory Visit: Payer: Self-pay

## 2022-03-02 DIAGNOSIS — C50411 Malignant neoplasm of upper-outer quadrant of right female breast: Secondary | ICD-10-CM

## 2022-03-02 DIAGNOSIS — D6481 Anemia due to antineoplastic chemotherapy: Secondary | ICD-10-CM | POA: Diagnosis not present

## 2022-03-02 DIAGNOSIS — Z171 Estrogen receptor negative status [ER-]: Secondary | ICD-10-CM | POA: Diagnosis not present

## 2022-03-02 DIAGNOSIS — Z5112 Encounter for antineoplastic immunotherapy: Secondary | ICD-10-CM | POA: Insufficient documentation

## 2022-03-02 DIAGNOSIS — Z923 Personal history of irradiation: Secondary | ICD-10-CM | POA: Insufficient documentation

## 2022-03-02 DIAGNOSIS — Z95828 Presence of other vascular implants and grafts: Secondary | ICD-10-CM

## 2022-03-02 LAB — CBC WITH DIFFERENTIAL (CANCER CENTER ONLY)
Abs Immature Granulocytes: 0.02 10*3/uL (ref 0.00–0.07)
Basophils Absolute: 0.1 10*3/uL (ref 0.0–0.1)
Basophils Relative: 1 %
Eosinophils Absolute: 0.3 10*3/uL (ref 0.0–0.5)
Eosinophils Relative: 3 %
HCT: 34.4 % — ABNORMAL LOW (ref 36.0–46.0)
Hemoglobin: 11.3 g/dL — ABNORMAL LOW (ref 12.0–15.0)
Immature Granulocytes: 0 %
Lymphocytes Relative: 22 %
Lymphs Abs: 1.8 10*3/uL (ref 0.7–4.0)
MCH: 29.1 pg (ref 26.0–34.0)
MCHC: 32.8 g/dL (ref 30.0–36.0)
MCV: 88.7 fL (ref 80.0–100.0)
Monocytes Absolute: 0.8 10*3/uL (ref 0.1–1.0)
Monocytes Relative: 10 %
Neutro Abs: 5.1 10*3/uL (ref 1.7–7.7)
Neutrophils Relative %: 64 %
Platelet Count: 285 10*3/uL (ref 150–400)
RBC: 3.88 MIL/uL (ref 3.87–5.11)
RDW: 13.6 % (ref 11.5–15.5)
WBC Count: 8 10*3/uL (ref 4.0–10.5)
nRBC: 0 % (ref 0.0–0.2)

## 2022-03-02 LAB — CMP (CANCER CENTER ONLY)
ALT: 11 U/L (ref 0–44)
AST: 13 U/L — ABNORMAL LOW (ref 15–41)
Albumin: 4 g/dL (ref 3.5–5.0)
Alkaline Phosphatase: 69 U/L (ref 38–126)
Anion gap: 4 — ABNORMAL LOW (ref 5–15)
BUN: 13 mg/dL (ref 6–20)
CO2: 28 mmol/L (ref 22–32)
Calcium: 8.7 mg/dL — ABNORMAL LOW (ref 8.9–10.3)
Chloride: 106 mmol/L (ref 98–111)
Creatinine: 0.71 mg/dL (ref 0.44–1.00)
GFR, Estimated: >60 mL/min (ref >60)
Glucose, Bld: 88 mg/dL (ref 70–99)
Potassium: 4 mmol/L (ref 3.5–5.1)
Sodium: 138 mmol/L (ref 135–145)
Total Bilirubin: 0.3 mg/dL (ref 0.3–1.2)
Total Protein: 6.6 g/dL (ref 6.5–8.1)

## 2022-03-02 MED ORDER — SODIUM CHLORIDE 0.9 % IV SOLN: Freq: Once | INTRAVENOUS | Status: AC

## 2022-03-02 MED ORDER — SODIUM CHLORIDE 0.9% FLUSH
10.0000 mL | INTRAVENOUS | Status: DC | PRN
  Administered 2022-03-02: 10 mL

## 2022-03-02 MED ORDER — DIPHENHYDRAMINE HCL 25 MG PO CAPS
25.0000 mg | ORAL_CAPSULE | Freq: Once | ORAL | Status: AC
  Administered 2022-03-02: 25 mg via ORAL
  Filled 2022-03-02: qty 1

## 2022-03-02 MED ORDER — SODIUM CHLORIDE 0.9% FLUSH
10.0000 mL | Freq: Once | INTRAVENOUS | Status: AC
  Administered 2022-03-02: 10 mL

## 2022-03-02 MED ORDER — ACETAMINOPHEN 325 MG PO TABS
650.0000 mg | ORAL_TABLET | Freq: Once | ORAL | Status: AC
  Administered 2022-03-02: 650 mg via ORAL
  Filled 2022-03-02: qty 2

## 2022-03-02 MED ORDER — HEPARIN SOD (PORK) LOCK FLUSH 100 UNIT/ML IV SOLN
500.0000 [IU] | Freq: Once | INTRAVENOUS | Status: AC | PRN
  Administered 2022-03-02: 500 [IU]

## 2022-03-02 MED ORDER — TRASTUZUMAB-ANNS CHEMO 150 MG IV SOLR
6.0000 mg/kg | Freq: Once | INTRAVENOUS | Status: AC
  Administered 2022-03-02: 441 mg via INTRAVENOUS
  Filled 2022-03-02: qty 21

## 2022-03-02 NOTE — Patient Instructions (Signed)
[facility] MEDICAL [facility]COLOG[facility]  Discharge Instructions: ?Thank you for choosing [facility] to provide your oncology and hematology care.  ? ?If you have a lab appointment with the Cancer Center, please go directly to the Cancer Center and check in at the registration area. ?  ?Wear comfortable clothing and clothing appropriate for easy access to any Portacath or PICC line.  ? ?We strive to give you quality time with your provider. You may need to reschedule your appointment if you arrive late (15 or more minutes).  Arriving late affects you and other patients whose appointments are after yours.  Also, if you miss three or more appointments without notifying the office, you may be dismissed from the clinic at the provider?s discretion.    ?  ?For prescription refill requests, have your pharmacy contact our office and allow 72 hours for refills to be completed.   ? ?Today you received the following chemotherapy and/or immunotherapy agents: Trastuzumab    ?  ?To help prevent nausea and vomiting after your treatment, we encourage you to take your nausea medication as directed. ? ?BELOW ARE SYMPTOMS THAT SHOULD BE REPORTED IMMEDIATELY: ?*FEVER GREATER THAN 100.4 F (38 ?C) OR HIGHER ?*CHILLS OR SWEATING ?*NAUSEA AND VOMITING THAT IS NOT CONTROLLED WITH YOUR NAUSEA MEDICATION ?*UNUSUAL SHORTNESS OF BREATH ?*UNUSUAL BRUISING OR BLEEDING ?*URINARY PROBLEMS (pain or burning when urinating, or frequent urination) ?*BOWEL PROBLEMS (unusual diarrhea, constipation, pain near the anus) ?TENDERNESS IN MOUTH AND THROAT WITH OR WITHOUT PRESENCE OF ULCERS (sore throat, sores in mouth, or a toothache) ?UNUSUAL RASH, SWELLING OR PAIN  ?UNUSUAL VAGINAL DISCHARGE OR ITCHING  ? ?Items with * indicate a potential emergency and should be followed up as soon as possible or go to the Emergency Department if any problems should occur. ? ?Please show the CHEMOTHERAPY ALERT CARD or IMMUNOTHERAPY ALERT CARD at check-in  to the Emergency Department and triage nurse. ? ?Should you have questions after your visit or need to cancel or reschedule your appointment, please contact [facility]  Dept: [phone]  and follow the prompts.  Office hours are 8:00 a.m. to 4:30 p.m. Monday - Friday. Please note that voicemails left after 4:00 p.m. may not be returned until the following business day.  We are closed weekends and major holidays. You have access to a nurse at all times for urgent questions. Please call the main number to the clinic Dept: [phone] and follow the prompts. ? ? ?For any non-urgent questions, you may also contact your provider using MyChart. We now offer e-Visits for anyone 18 and older to request care online for non-urgent symptoms. For details visit [email or web address]. ?  ?Also download the MyChart app! Go to the app store, search "MyChart", open the app, select [facility], and log in with your MyChart username and password. ? ?Due to Covid, a mask is required upon entering the hospital/clinic. If you do not have a mask, one will be given to you upon arrival. For doctor visits, patients may have 1 support person aged 18 or older with them. For treatment visits, patients cannot have anyone with them due to current Covid guidelines and our immunocompromised population.  ? ?

## 2022-03-02 NOTE — Assessment & Plan Note (Signed)
06/18/2021:Right lumpectomy: Grade 3 IDC 1.5 cm, high-grade DCIS, margins negative, 0/2 lymph nodes negative, ER 0%, PR 0%, HER2 3+ positive, Ki-67 30%? ?? ?(2013?Right breast cancer: Stage Ia ER/PR positive HER2 negative status postlumpectomy, radiation (low risk Oncotype) could not tolerate tamoxifen for more than 30 days.) ?? ?Treatment plan: ?1.??Adjuvant chemotherapy with Taxol and Herceptin followed by Herceptin maintenance ?2.?breast radiation completed 01/04/2022 ?----------------------------------------------------------------------------------------------------------------------------------- ?Current treatment:?Completed 12 cycles of?Taxol Herceptin, currently on Herceptin maintenance ?Echocardiogram: EF?50-55%?(okay to treat)   ?? ?Chemo toxicities: ?1. Fatigue: Much improved ?2.?Chemotherapy-induced anemia: Today's hemoglobin is 11.6 ?? ?Return to clinic?every 3 weeks for Herceptin every 6 weeks with follow-up with me ?

## 2022-03-03 ENCOUNTER — Telehealth: Payer: Self-pay | Admitting: Hematology and Oncology

## 2022-03-03 NOTE — Telephone Encounter (Signed)
Scheduled appointment per 5/16 los. Patient is aware. ?

## 2022-03-22 ENCOUNTER — Encounter: Payer: Self-pay | Admitting: *Deleted

## 2022-03-23 ENCOUNTER — Other Ambulatory Visit: Payer: Self-pay

## 2022-03-23 ENCOUNTER — Inpatient Hospital Stay: Payer: Medicare Other | Attending: Internal Medicine

## 2022-03-23 VITALS — BP 115/91 | HR 69 | Temp 97.9°F | Resp 17

## 2022-03-23 DIAGNOSIS — C50411 Malignant neoplasm of upper-outer quadrant of right female breast: Secondary | ICD-10-CM | POA: Diagnosis not present

## 2022-03-23 DIAGNOSIS — Z923 Personal history of irradiation: Secondary | ICD-10-CM | POA: Diagnosis not present

## 2022-03-23 DIAGNOSIS — Z170 Estrogen receptor positive status [ER+]: Secondary | ICD-10-CM | POA: Insufficient documentation

## 2022-03-23 DIAGNOSIS — D6481 Anemia due to antineoplastic chemotherapy: Secondary | ICD-10-CM | POA: Insufficient documentation

## 2022-03-23 DIAGNOSIS — Z5112 Encounter for antineoplastic immunotherapy: Secondary | ICD-10-CM | POA: Diagnosis not present

## 2022-03-23 MED ORDER — SODIUM CHLORIDE 0.9 % IV SOLN: Freq: Once | INTRAVENOUS | Status: AC

## 2022-03-23 MED ORDER — TRASTUZUMAB-ANNS CHEMO 150 MG IV SOLR
6.0000 mg/kg | Freq: Once | INTRAVENOUS | Status: AC
  Administered 2022-03-23: 441 mg via INTRAVENOUS
  Filled 2022-03-23: qty 21

## 2022-03-23 MED ORDER — DIPHENHYDRAMINE HCL 25 MG PO CAPS
25.0000 mg | ORAL_CAPSULE | Freq: Once | ORAL | Status: AC
  Administered 2022-03-23: 25 mg via ORAL
  Filled 2022-03-23: qty 1

## 2022-03-23 MED ORDER — HEPARIN SOD (PORK) LOCK FLUSH 100 UNIT/ML IV SOLN
500.0000 [IU] | Freq: Once | INTRAVENOUS | Status: AC | PRN
  Administered 2022-03-23: 500 [IU]

## 2022-03-23 MED ORDER — ACETAMINOPHEN 325 MG PO TABS
650.0000 mg | ORAL_TABLET | Freq: Once | ORAL | Status: AC
  Administered 2022-03-23: 650 mg via ORAL
  Filled 2022-03-23: qty 2

## 2022-03-23 MED ORDER — SODIUM CHLORIDE 0.9% FLUSH
10.0000 mL | INTRAVENOUS | Status: DC | PRN
  Administered 2022-03-23: 10 mL

## 2022-03-23 NOTE — Patient Instructions (Signed)
[facility]  Discharge Instructions: ?Thank you for choosing [facility] to provide your oncology and hematology care.  ? ?If you have a lab appointment with the Cancer Center, please go directly to the Cancer Center and check in at the registration area. ?  ?Wear comfortable clothing and clothing appropriate for easy access to any Portacath or PICC line.  ? ?We strive to give you quality time with your provider. You may need to reschedule your appointment if you arrive late (15 or more minutes).  Arriving late affects you and other patients whose appointments are after yours.  Also, if you miss three or more appointments without notifying the office, you may be dismissed from the clinic at the provider?s discretion.    ?  ?For prescription refill requests, have your pharmacy contact our office and allow 72 hours for refills to be completed.   ? ?Today you received the following chemotherapy and/or immunotherapy agent: Kanjinti    ?  ?To help prevent nausea and vomiting after your treatment, we encourage you to take your nausea medication as directed. ? ?BELOW ARE SYMPTOMS THAT SHOULD BE REPORTED IMMEDIATELY: ?*FEVER GREATER THAN 100.4 F (38 ?C) OR HIGHER ?*CHILLS OR SWEATING ?*NAUSEA AND VOMITING THAT IS NOT CONTROLLED WITH YOUR NAUSEA MEDICATION ?*UNUSUAL SHORTNESS OF BREATH ?*UNUSUAL BRUISING OR BLEEDING ?*URINARY PROBLEMS (pain or burning when urinating, or frequent urination) ?*BOWEL PROBLEMS (unusual diarrhea, constipation, pain near the anus) ?TENDERNESS IN MOUTH AND THROAT WITH OR WITHOUT PRESENCE OF ULCERS (sore throat, sores in mouth, or a toothache) ?UNUSUAL RASH, SWELLING OR PAIN  ?UNUSUAL VAGINAL DISCHARGE OR ITCHING  ? ?Items with * indicate a potential emergency and should be followed up as soon as possible or go to the Emergency Department if any problems should occur. ? ?Please show the CHEMOTHERAPY ALERT CARD or IMMUNOTHERAPY ALERT CARD at check-in to  the Emergency Department and triage nurse. ? ?Should you have questions after your visit or need to cancel or reschedule your appointment, please contact [facility]  Dept: [phone]  and follow the prompts.  Office hours are 8:00 a.m. to 4:30 p.m. Monday - Friday. Please note that voicemails left after 4:00 p.m. may not be returned until the following business day.  We are closed weekends and major holidays. You have access to a nurse at all times for urgent questions. Please call the main number to the clinic Dept: [phone] and follow the prompts. ? ? ?For any non-urgent questions, you may also contact your provider using MyChart. We now offer e-Visits for anyone 18 and older to request care online for non-urgent symptoms. For details visit [email or web address]. ?  ?Also download the MyChart app! Go to the app store, search "MyChart", open the app, select , and log in with your MyChart username and password. ? ?Due to Covid, a mask is required upon entering the hospital/clinic. If you do not have a mask, one will be given to you upon arrival. For doctor visits, patients may have 1 support person aged 18 or older with them. For treatment visits, patients cannot have anyone with them due to current Covid guidelines and our immunocompromised population.  ? ?

## 2022-03-30 NOTE — Progress Notes (Incomplete)
Patient Care Team: Luetta Nutting, DO as PCP - General (Family Medicine) Eppie Gibson, MD as Consulting Physician (Radiation Oncology) Mickeal Skinner, Acey Lav, MD as Consulting Physician (Oncology) [facility] [place] (Ophthalmology) Bridget Hartshorn, Noel Journey, MD as Referring Physician (Dermatology) Helayne Seminole, MD as Referring Physician (Otolaryngology) Mauro Kaufmann, RN as Oncology Nurse Navigator Rockwell Germany, RN as Oncology Nurse Navigator  DIAGNOSIS: No diagnosis found.  SUMMARY OF ONCOLOGIC HISTORY: Oncology History  Cancer of upper-outer quadrant of female breast ([place])  2013 Initial Diagnosis   Right breast cancer: Stage Ia ER/PR positive HER2 negative status postlumpectomy, radiation (low risk Oncotype) could not tolerate tamoxifen for more than 30 days.   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  Biopsy revealed grade 3 IDC ER/PR negative HER2 positive with a Ki-67 of 30%   05/26/2021 Cancer Staging   Staging form: Breast, AJCC 7th Edition - Clinical stage from 05/26/2021: Stage Unknown (T1c, NX, cM0) - Signed by Eppie Gibson, MD on 05/28/2021 Specimen type: Core Needle Biopsy Stage prefix: Initial diagnosis Laterality: Right Tumor grade (Scarff-Bloom-Richardson system): G3 Estrogen receptor status: Negative Progesterone receptor status: Negative HER2 status: Positive Staging comments: Staged at Breast Cancer Conference 12/08/11    06/18/2021 Surgery   Right lumpectomy: Grade 3 IDC 1.5 cm, high-grade DCIS, margins negative, 0/2 lymph nodes negative, ER 0%, PR 0%, HER2 3+ positive, Ki-67 30%    Genetic Testing   Negative genetic testing. No pathogenic variants identified on the [facility] Multi-Cancer Panel+RNA. VUS in POLD1 identified called  c.2429C>T. The report date is 07/01/2021.  The Multi-Cancer Panel + RNA offered by [facility] includes sequencing and/or deletion duplication testing of the following 84 genes: AIP, ALK, APC, ATM, AXIN2,BAP1,  BARD1, BLM, BMPR1A, BRCA1, BRCA2, BRIP1, CASR, CDC73, CDH1, CDK4, CDKN1B, CDKN1C, CDKN2A (p14ARF), CDKN2A (p16INK4a), CEBPA, CHEK2, CTNNA1, DICER1, DIS3L2, EGFR (c.2369C>T, p.Thr790Met variant only), EPCAM (Deletion/duplication testing only), FH, FLCN, GATA2, GPC3, GREM1 (Promoter region deletion/duplication testing only), HOXB13 (c.251G>A, p.Gly84Glu), HRAS, KIT, MAX, MEN1, MET, MITF (c.952G>A, p.Glu318Lys variant only), MLH1, MSH2, MSH3, MSH6, MUTYH, NBN, NF1, NF2, NTHL1, PALB2, PDGFRA, PHOX2B, PMS2, POLD1, POLE, POT1, PRKAR1A, PTCH1, PTEN, RAD50, RAD51C, RAD51D, RB1, RECQL4, RET, RUNX1, SDHAF2, SDHA (sequence changes only), SDHB, SDHC, SDHD, SMAD4, SMARCA4, SMARCB1, SMARCE1, STK11, SUFU, TERC, TERT, TMEM127, TP53, TSC1, TSC2, VHL, WRN and WT1.   07/21/2021 -  Chemotherapy   Patient is on Treatment Plan : BREAST Paclitaxel + Trastuzumab q7d / Trastuzumab q21d     11/25/2021 - 01/04/2022 Radiation Therapy   Adj Radiation with Dr.Squire     CHIEF COMPLIANT: Day 1 cycle 12  INTERVAL HISTORY: Lori Mcmillan is a 61 y.o. with above-mentioned history of  HER2 positive breast cancer    ALLERGIES:  has no active allergies.  MEDICATIONS:  Current Outpatient Medications  Medication Sig Dispense Refill   ALPRAZolam (XANAX) 0.25 MG tablet Take 1 tablet (0.25 mg total) by mouth 2 (two) times daily as needed for anxiety. 30 tablet 1   diazepam (VALIUM) 2 MG tablet Take 2 mg by mouth at bedtime as needed for anxiety.     lidocaine-prilocaine (EMLA) cream Apply to affected area once 30 g 3   predniSONE (DELTASONE) 10 MG tablet Take 1 tablet (10 mg total) by mouth daily with breakfast.  60 tablet 0   predniSONE (DELTASONE) 10 MG tablet Take 10 mg by mouth as needed. For vertigo     No current  facility-administered medications for this visit.    PHYSICAL EXAMINATION: ECOG PERFORMANCE STATUS: {CHL ONC ECOG PS:[phone]}  There were no vitals filed for this visit. There were no vitals filed for this visit.  BREAST:*** No palpable masses or nodules in either right or left breasts. No palpable axillary supraclavicular or infraclavicular adenopathy no breast tenderness or nipple discharge. (exam performed in the presence of a chaperone)  LABORATORY DATA:  I have reviewed the data as listed    Latest Ref Rng & Units 03/02/2022   10:01 AM 01/19/2022    9:29 AM 12/08/2021    9:39 AM  CMP  Glucose 70 - 99 mg/dL 88  89  94   BUN 6 - 20 mg/dL _0 Creatinine 0.44 - 1.00 mg/dL 0.71  0.69  0.62   Sodium 135 - 145 mmol/L 138  138  138   Potassium 3.5 - 5.1 mmol/L 4.0  3.8  4.0   Chloride 98 - 111 mmol/L 106  107  107   CO2 22 - 32 mmol/L _1 Calcium 8.9 - 10.3 mg/dL 8.7  9.2  9.0   Total Protein 6.5 - 8.1 g/dL 6.6  6.9  6.8   Total Bilirubin 0.3 - 1.2 mg/dL 0.3  0.3  0.3   Alkaline Phos 38 - 126 U/L 69  71  73   AST 15 - 41 U/L _2 ALT 0 - 44 U/L _3 Lab Results  Component Value Date   WBC 8.0 03/02/2022   HGB 11.3 (L) 03/02/2022   HCT 34.4 (L) 03/02/2022   MCV 88.7 03/02/2022   PLT 285 03/02/2022   NEUTROABS 5.1 03/02/2022    ASSESSMENT & PLAN:  No problem-specific Assessment & Plan notes found for this encounter.    No orders of the defined types were placed in this encounter.  The patient has a good understanding of the overall plan. she agrees with it. she will call with any problems that may develop before the next visit here. Total time spent: 30 mins including face to face time and time spent for planning, charting and co-ordination of care   Suzzette Righter, [place] 03/30/22    I Gardiner Coins am scribing for Dr. Lindi Adie  ***

## 2022-04-05 ENCOUNTER — Other Ambulatory Visit: Payer: Self-pay | Admitting: Radiation Therapy

## 2022-04-08 IMAGING — DX DG RIBS W/ CHEST 3+V*R*
3 series · 3 of 3 positions shown · non-contrast
Comparison: Chest x-ray 05/30/2013

CLINICAL DATA: Pain in the right ribs

EXAM:
RIGHT RIBS AND CHEST - 3+ VIEW

[chest pa]
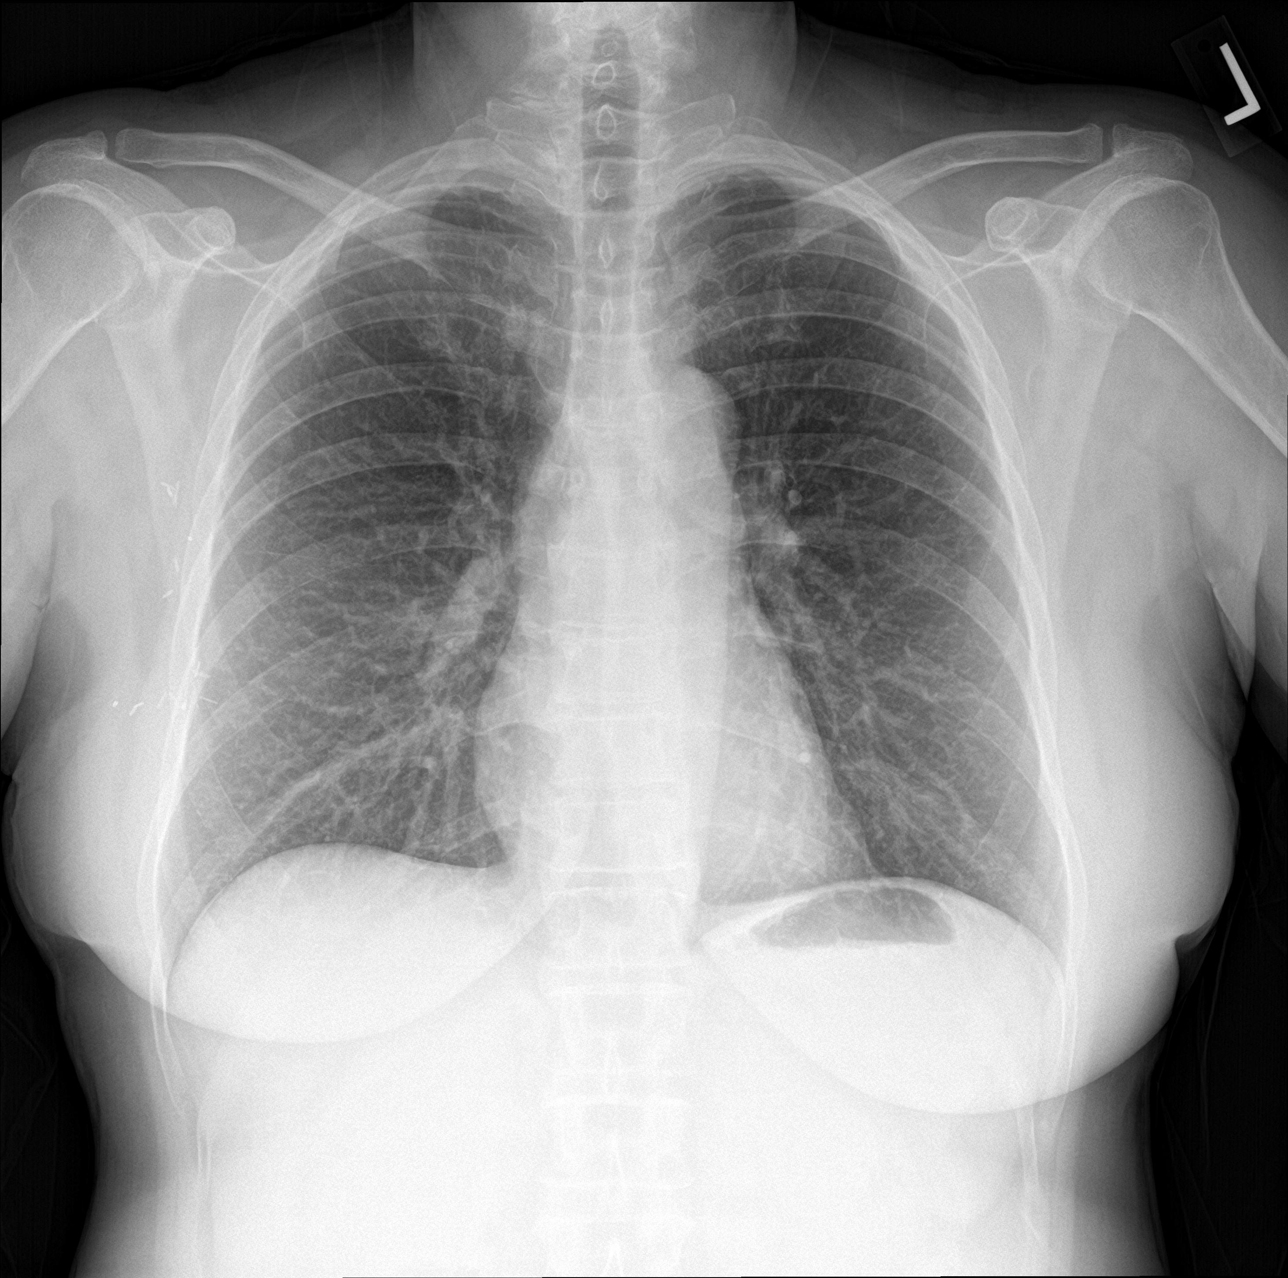

[rib pa]
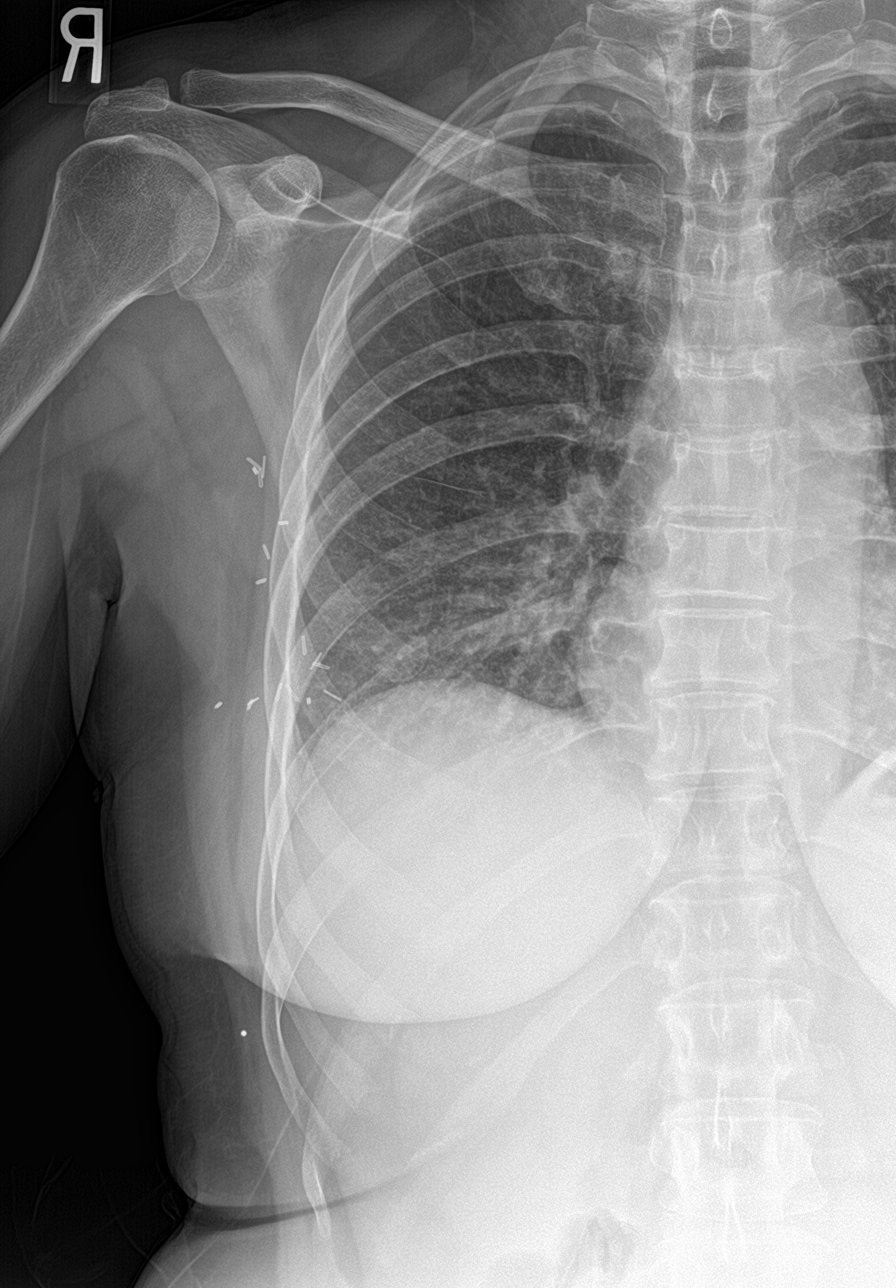

[rib pa obl]
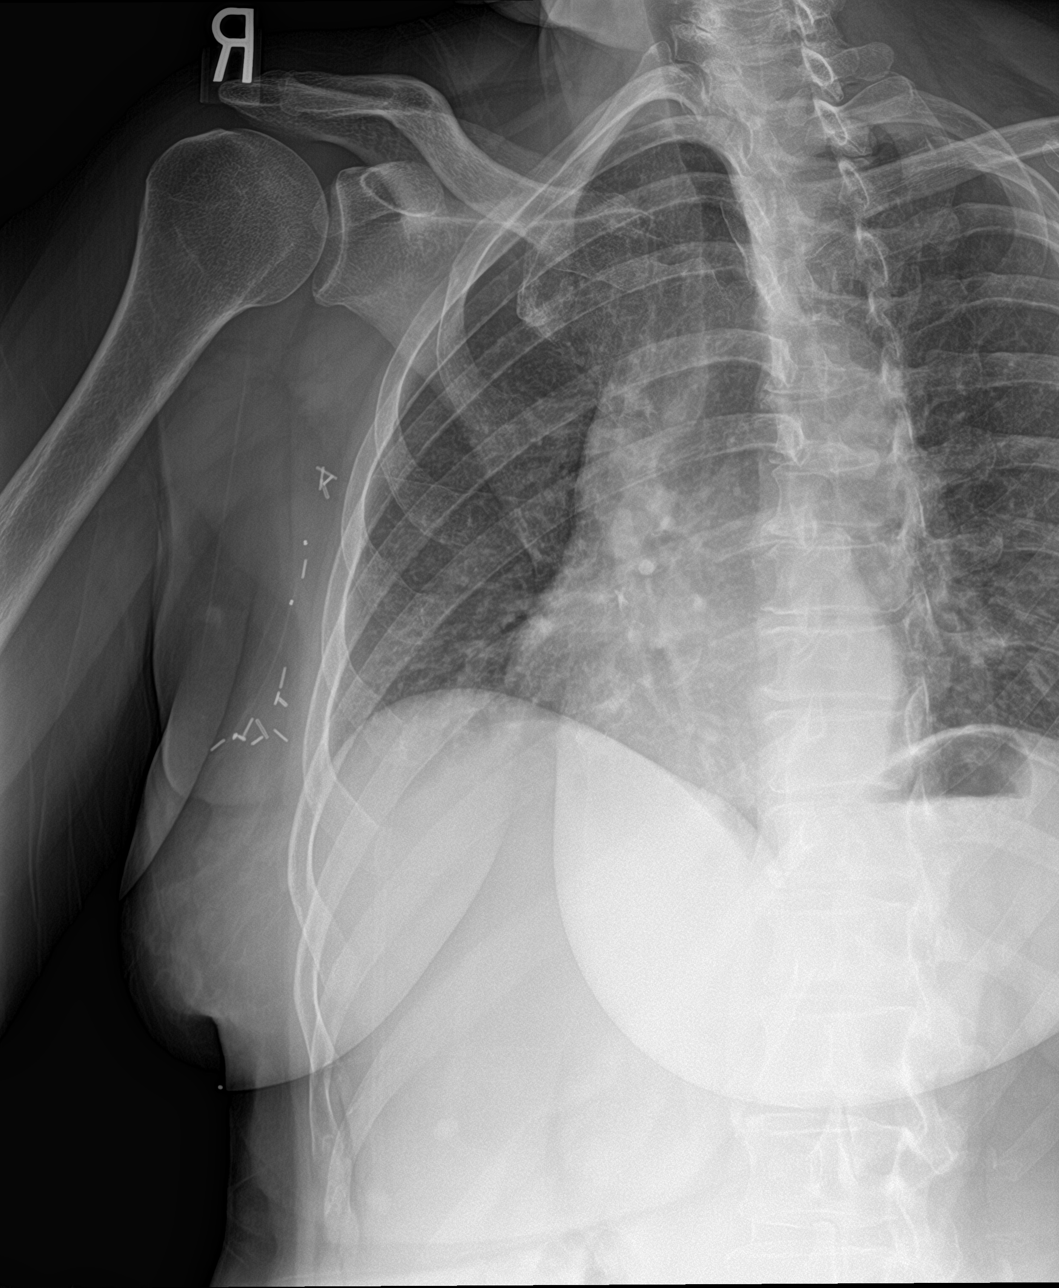

[3 of 3 positions shown; findings below may reference images not displayed]

FINDINGS: Single-view chest demonstrates no focal opacity or pleural effusion.
Normal heart size. No pneumothorax. Clips in the right axilla.

Right rib series demonstrates no acute displaced right rib fracture.
IMPRESSION: Negative.

## 2022-04-13 ENCOUNTER — Inpatient Hospital Stay: Payer: Medicare Other

## 2022-04-13 ENCOUNTER — Other Ambulatory Visit: Payer: Self-pay

## 2022-04-13 ENCOUNTER — Inpatient Hospital Stay (HOSPITAL_BASED_OUTPATIENT_CLINIC_OR_DEPARTMENT_OTHER): Payer: Medicare Other | Admitting: Hematology and Oncology

## 2022-04-13 DIAGNOSIS — Z923 Personal history of irradiation: Secondary | ICD-10-CM | POA: Diagnosis not present

## 2022-04-13 DIAGNOSIS — D6481 Anemia due to antineoplastic chemotherapy: Secondary | ICD-10-CM | POA: Diagnosis not present

## 2022-04-13 DIAGNOSIS — C50411 Malignant neoplasm of upper-outer quadrant of right female breast: Secondary | ICD-10-CM | POA: Diagnosis not present

## 2022-04-13 DIAGNOSIS — Z95828 Presence of other vascular implants and grafts: Secondary | ICD-10-CM

## 2022-04-13 DIAGNOSIS — Z170 Estrogen receptor positive status [ER+]: Secondary | ICD-10-CM | POA: Diagnosis not present

## 2022-04-13 DIAGNOSIS — Z5112 Encounter for antineoplastic immunotherapy: Secondary | ICD-10-CM | POA: Diagnosis not present

## 2022-04-13 LAB — CMP (CANCER CENTER ONLY)
ALT: 17 U/L (ref 0–44)
AST: 15 U/L (ref 15–41)
Albumin: 4.2 g/dL (ref 3.5–5.0)
Alkaline Phosphatase: 63 U/L (ref 38–126)
Anion gap: 4 — ABNORMAL LOW (ref 5–15)
BUN: 15 mg/dL (ref 6–20)
CO2: 26 mmol/L (ref 22–32)
Calcium: 9.3 mg/dL (ref 8.9–10.3)
Chloride: 107 mmol/L (ref 98–111)
Creatinine: 0.78 mg/dL (ref 0.44–1.00)
GFR, Estimated: >60 mL/min (ref >60)
Glucose, Bld: 95 mg/dL (ref 70–99)
Potassium: 3.8 mmol/L (ref 3.5–5.1)
Sodium: 137 mmol/L (ref 135–145)
Total Bilirubin: 0.3 mg/dL (ref 0.3–1.2)
Total Protein: 6.7 g/dL (ref 6.5–8.1)

## 2022-04-13 LAB — CBC WITH DIFFERENTIAL (CANCER CENTER ONLY)
Abs Immature Granulocytes: 0.02 10*3/uL (ref 0.00–0.07)
Basophils Absolute: 0.1 10*3/uL (ref 0.0–0.1)
Basophils Relative: 1 %
Eosinophils Absolute: 0.1 10*3/uL (ref 0.0–0.5)
Eosinophils Relative: 2 %
HCT: 35.3 % — ABNORMAL LOW (ref 36.0–46.0)
Hemoglobin: 11.6 g/dL — ABNORMAL LOW (ref 12.0–15.0)
Immature Granulocytes: 0 %
Lymphocytes Relative: 19 %
Lymphs Abs: 1.5 10*3/uL (ref 0.7–4.0)
MCH: 29.4 pg (ref 26.0–34.0)
MCHC: 32.9 g/dL (ref 30.0–36.0)
MCV: 89.6 fL (ref 80.0–100.0)
Monocytes Absolute: 0.7 10*3/uL (ref 0.1–1.0)
Monocytes Relative: 9 %
Neutro Abs: 5.4 10*3/uL (ref 1.7–7.7)
Neutrophils Relative %: 69 %
Platelet Count: 270 10*3/uL (ref 150–400)
RBC: 3.94 MIL/uL (ref 3.87–5.11)
RDW: 13.3 % (ref 11.5–15.5)
WBC Count: 7.9 10*3/uL (ref 4.0–10.5)
nRBC: 0 % (ref 0.0–0.2)

## 2022-04-13 MED ORDER — DIPHENHYDRAMINE HCL 25 MG PO CAPS
25.0000 mg | ORAL_CAPSULE | Freq: Once | ORAL | Status: AC
  Administered 2022-04-13: 25 mg via ORAL
  Filled 2022-04-13: qty 1

## 2022-04-13 MED ORDER — SODIUM CHLORIDE 0.9 % IV SOLN: Freq: Once | INTRAVENOUS | Status: AC

## 2022-04-13 MED ORDER — HEPARIN SOD (PORK) LOCK FLUSH 100 UNIT/ML IV SOLN
500.0000 [IU] | Freq: Once | INTRAVENOUS | Status: AC | PRN
  Administered 2022-04-13: 500 [IU]

## 2022-04-13 MED ORDER — SODIUM CHLORIDE 0.9% FLUSH
10.0000 mL | INTRAVENOUS | Status: DC | PRN
  Administered 2022-04-13: 10 mL

## 2022-04-13 MED ORDER — TRASTUZUMAB-ANNS CHEMO 150 MG IV SOLR
6.0000 mg/kg | Freq: Once | INTRAVENOUS | Status: AC
  Administered 2022-04-13: 441 mg via INTRAVENOUS
  Filled 2022-04-13: qty 21

## 2022-04-13 MED ORDER — ACETAMINOPHEN 325 MG PO TABS
650.0000 mg | ORAL_TABLET | Freq: Once | ORAL | Status: AC
  Administered 2022-04-13: 650 mg via ORAL
  Filled 2022-04-13: qty 2

## 2022-04-13 MED ORDER — SODIUM CHLORIDE 0.9% FLUSH
10.0000 mL | Freq: Once | INTRAVENOUS | Status: AC
  Administered 2022-04-13: 10 mL

## 2022-04-15 ENCOUNTER — Ambulatory Visit (HOSPITAL_COMMUNITY)
Admission: RE | Admit: 2022-04-15 | Discharge: 2022-04-15 | Disposition: A | Discharge status: Home / Self Care | Payer: Medicare Other | Source: Ambulatory Visit | Attending: Internal Medicine | Admitting: Internal Medicine

## 2022-04-15 ENCOUNTER — Ambulatory Visit (INDEPENDENT_AMBULATORY_CARE_PROVIDER_SITE_OTHER)
Admission: RE | Admit: 2022-04-15 | Discharge: 2022-04-15 | Disposition: A | Discharge status: Home / Self Care | Payer: Medicare Other | Source: Ambulatory Visit | Attending: Internal Medicine | Admitting: Internal Medicine

## 2022-04-15 ENCOUNTER — Telehealth: Payer: Self-pay | Admitting: Pharmacist

## 2022-04-15 ENCOUNTER — Encounter (HOSPITAL_COMMUNITY): Payer: Self-pay | Admitting: Internal Medicine

## 2022-04-15 VITALS — BP 116/70 | HR 67 | Wt 153.8 lb

## 2022-04-15 DIAGNOSIS — C50911 Malignant neoplasm of unspecified site of right female breast: Secondary | ICD-10-CM | POA: Diagnosis not present

## 2022-04-15 DIAGNOSIS — Z87891 Personal history of nicotine dependence: Secondary | ICD-10-CM | POA: Diagnosis not present

## 2022-04-15 DIAGNOSIS — I509 Heart failure, unspecified: Secondary | ICD-10-CM | POA: Insufficient documentation

## 2022-04-15 DIAGNOSIS — H8110 Benign paroxysmal vertigo, unspecified ear: Secondary | ICD-10-CM | POA: Diagnosis not present

## 2022-04-15 DIAGNOSIS — Z1502 Genetic susceptibility to malignant neoplasm of ovary: Secondary | ICD-10-CM | POA: Diagnosis not present

## 2022-04-15 DIAGNOSIS — C50411 Malignant neoplasm of upper-outer quadrant of right female breast: Secondary | ICD-10-CM

## 2022-04-15 DIAGNOSIS — Z170 Estrogen receptor positive status [ER+]: Secondary | ICD-10-CM | POA: Insufficient documentation

## 2022-04-15 DIAGNOSIS — Z0189 Encounter for other specified special examinations: Secondary | ICD-10-CM | POA: Diagnosis not present

## 2022-04-15 DIAGNOSIS — Z803 Family history of malignant neoplasm of breast: Secondary | ICD-10-CM | POA: Diagnosis not present

## 2022-04-15 DIAGNOSIS — Z853 Personal history of malignant neoplasm of breast: Secondary | ICD-10-CM | POA: Insufficient documentation

## 2022-04-15 DIAGNOSIS — R42 Dizziness and giddiness: Secondary | ICD-10-CM | POA: Insufficient documentation

## 2022-04-15 DIAGNOSIS — Z1501 Genetic susceptibility to malignant neoplasm of breast: Secondary | ICD-10-CM | POA: Diagnosis not present

## 2022-04-15 LAB — ECHOCARDIOGRAM COMPLETE
Area-P 1/2: 3.42 cm2
Calc EF: 54.3 %
S' Lateral: 3.1 cm
Single Plane A2C EF: 55 %
Single Plane A4C EF: 56.6 %

## 2022-04-15 NOTE — Addendum Note (Signed)
Encounter addended by: Jerl Mina, RN on: 04/15/2022 11:08 AM  Actions taken: Order list changed, Diagnosis association updated, Clinical Note Signed

## 2022-04-15 NOTE — Patient Instructions (Addendum)
There has been no changes to your medications.  Your physician has requested that you have an echocardiogram. Echocardiography is a painless test that uses sound waves to create images of your heart. It provides your doctor with information about the size and shape of your heart and how well your heart's chambers and valves are working. This procedure takes approximately one hour. There are no restrictions for this procedure.  You have been referred to the pharmacy for [employer] They will call you to arrange your appointment   Your physician recommends that you schedule a follow-up appointment in: 3 months ( October 2023) **please call the office in August to arrange your follow up appointment **  If you have any questions or concerns before your next appointment please send [place] a message through [facility] or call our office at [phone].    TO LEAVE A MESSAGE FOR THE NURSE SELECT OPTION 2, PLEASE LEAVE A MESSAGE INCLUDING: YOUR NAME DATE OF BIRTH CALL BACK NUMBER REASON FOR CALL**this is important as we prioritize the call backs  YOU WILL RECEIVE A CALL BACK THE SAME DAY AS LONG AS YOU CALL BEFORE 4:00 PM  At the [facility], you and your health needs are our priority. As part of our continuing mission to provide you with exceptional heart care, we have created designated Provider Care Teams. These Care Teams include your primary Cardiologist (physician) and Advanced Practice Providers (APPs- Physician Assistants and Nurse Practitioners) who all work together to provide you with the care you need, when you need it.   You may see any of the following providers on your designated Care Team at your next follow up: Dr Glori Bickers Dr Haynes Kerns, NP Lyda Jester, [place] [facility] [place], [place] Audry Riles, PharmD   Please be sure to bring in all your medications bottles to every appointment.

## 2022-04-15 NOTE — Telephone Encounter (Signed)
Pt referred by CHF clinic to initiate [place] therapy for weight loss. She has [place] with Medicaid which does not cover weight loss medications like Wegovy or Saxenda. She does not have DM so insurance will not cover other GLPs either. Called pt to make her aware, left detailed message and let her know she can call back with any concerns.

## 2022-04-15 NOTE — Progress Notes (Signed)
CARDIO-ONCOLOGY CLINIC NOTE  Referring Physician: Dr. Lindi Adie Primary Care: Luetta Nutting, DO Primary Cardiologist: None   HPI:  Lori Mcmillan is a 61 y.o. female with h/o tobacco use (quit 2018), migraine HAs and meningioma and right breast cancer referred by Dr. Lindi Adie for enrollment into the Cardio-Oncology program.  2013 Diagnosed with Stage Ia R breast cancer (ER/PR + HER-2 -) treated with lumpectomy and XRT. Tamoxifen x 30 days (stopped due to intolerance  2015 Meningioma resection and XRT  8/22 found to have another R breast CA (ER/PR - HER-2 +). Lumpectomy 9/22. Margins and LNs negative  10/22 Treated with Taxol + Trastuzumab q7d / Trastuzumab q21d x 12 followed by Herceptin maintenance therapy q 21 days. Getting XRT now.    Echo 9/22 EF 60-65% GLS -24% Echo 1/23 EF 50-55% GLS -17.7% Echo 01/01/22  EF 55-60% GLS -19.7%  Here for f/u. Tolerating Herceptin well. Four more doses left. Has been having vertigo for past 6 weeks. Worried meningioma may be coming back. Has MRI next week. No other focal neruo signs/symptoms. Walking every day.   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  No sign of residual dz at 04/2018 rad onc f/u and 10/2018 neuro-onc f/u. 03/2019 MRI brain->no resid/no recurrence.   Brain embolism and thrombosis    Breast cancer ([place])    Rt breast; 1.5 cm low grade invasive ductal carcinoma status post lumpectomy with sentinel node biopsy on 01/04/2012.   Chicken pox    Depression    zoloft in past--?wt gain.   Diplopia    chronic (meningioma-related)   Eustachian tube dysfunction 09/05/2013   Family history of breast cancer    Family history of colon cancer    Family history of melanoma    Family history of ovarian cancer    Family history of pancreatic cancer    History of radiation therapy  02/24/12-04/11/12   right breast/ 45Gy$RemoveBeforeDEI'@1'DNgSkUcJMevyYjSK$ .8Gyx36fx/boost=16Gy$RemoveBef'@2'iMsDhUhgOm$  Gya37fx.  Latest mammo and u/s 03/2013--normal.   History of radiation therapy 11/30/17- 01/11/18   Right temporal lobe treated to 55.8 Gy with 31 fx of 1.8 Gy   Hx of basal cell carcinoma    Migraine    Palpitations    has taken Metoprolol for palpitations in the past.   Seizure ([place])    Taste impairment    s/p radiation therapy   Tobacco dependence 09/05/2013    Current Outpatient Medications  Medication Sig Dispense Refill   ALPRAZolam (XANAX) 0.25 MG tablet Take 1 tablet (0.25 mg total) by mouth 2 (two) times daily as needed for anxiety. 30 tablet 1   diazepam (VALIUM) 2 MG tablet Take 2 mg by mouth at bedtime as needed for anxiety.     lidocaine-prilocaine (EMLA) cream Apply to affected area once 30 g 3   predniSONE (DELTASONE) 10 MG tablet Take 10 mg by mouth as needed. For vertigo     No current facility-administered medications for this encounter.    No Active Allergies    Social History   Socioeconomic History   Marital status: Single    Spouse name: Not on file   Number of children: Not on file   Years of education: Not on file   Highest education level: Not on file  Occupational History   Not on file  Tobacco  Use   Smoking status: Former    Packs/day: 0.50    Types: Cigarettes    Quit date: 10/17/2017    Years since quitting: 4.4   Smokeless tobacco: Never  Vaping Use   Vaping Use: Never used  Substance and Sexual Activity   Alcohol use: Yes    Alcohol/week: 3.0 standard drinks of alcohol    Types: 3 Cans of beer per week    Comment: 3-4 beers/day   Drug use: Not Currently   Sexual activity: Not Currently    Partners: Male  Other Topics Concern   Not on file  Social History Narrative   Marital status/children/pets: Single.  No children.  Lives alone.  Has pets.Orig from [place] in [place].   Education/employment: Bachelor's degree, retired   Engineer, materials:      -smoke alarm in the home:Yes     -  wears seatbelt: Yes               Social Determinants of Health   Financial Resource Strain: Not on file  Food Insecurity: Not on file  Transportation Needs: Not on file  Physical Activity: Not on file  Stress: Not on file  Social Connections: Not on file  Intimate Partner Violence: Not on file      Family History  Problem Relation Age of Onset   Anesthesia problems Mother    Breast cancer Mother 88       lumpectomy, chemo, radiation   Ovarian cancer Maternal Aunt 46   Cancer Maternal Aunt        unknown type   Breast cancer Maternal Aunt 80   Breast cancer Maternal Aunt 86       bilateral   Pancreatic cancer Maternal Uncle        dx 43s   Stomach cancer Paternal Uncle    Cancer Paternal Uncle        unknown type   Lung cancer Paternal Uncle        hx smoking   Dementia Maternal Grandmother    Lung cancer Maternal Grandfather        hx smoking   Pancreatic cancer Paternal Grandmother 28   Melanoma Paternal Grandmother        dx >50, shin   Heart Problems Paternal Grandfather    Colon cancer Cousin 52       maternal first cousin   Breast cancer Cousin        dx 69s, paternal first cousin    Vitals:   04/15/22 1020  BP: 116/70  Pulse: 67  SpO2: 97%  Weight: 69.8 kg (153 lb 12.8 oz)    PHYSICAL EXAM: General:  Well appearing. No resp difficulty HEENT: normal Neck: supple. no JVD. + R port Carotids 2+ bilat; no bruits. No lymphadenopathy or thryomegaly appreciated. Cor: PMI nondisplaced. Regular rate & rhythm. No rubs, gallops or murmurs. Lungs: clear Abdomen: soft, nontender, nondistended. No hepatosplenomegaly. No bruits or masses. Good bowel sounds. Extremities: no cyanosis, clubbing, rash, edema Neuro: alert & orientedx3, cranial nerves grossly intact. moves all 4 extremities w/o difficulty. Affect pleasant. No nystagmus  ECG: NSR 75 No ST-T wave abnormalities. Personally reviewed   ASSESSMENT & PLAN:  1. R Breast Cancer - initial R breast CA  2013 ER/PR + HER-2 - s/p lumpectomy and XRT - 8/22 found to have another R breast CA (ER/PR - HER-2 +). Lumpectomy 9/22. Margins and LNs negative -10/22 Treated with Taxol + Trastuzumab q7d / Trastuzumab q21d x 12 followed  by Herceptin maintenance therapy q 21 days.  -Echo 9/22 EF 60-65% GLS -24% - Echo 1/23 EF 50-55% GLS -17.7% - Echo 01/01/22  EF 55-60% GLS -19.7% - Echo today 04/15/22 EF 55% GLS -20.9  - Echo stable continue Herceptin. Has 4 more doses. (Finished 07/06/22) Will get echo after last dose to ensure stability  2. Vertigo - has MRI next week. If develops other neuro s/s encouraged her to reach out to her Neuro-oncologist ASAP  Glori Bickers, MD  10:57 AM   Glori Bickers, MD  10:39 AM

## 2022-04-21 DIAGNOSIS — D1800 Hemangioma unspecified site: Secondary | ICD-10-CM | POA: Diagnosis not present

## 2022-04-21 DIAGNOSIS — H04123 Dry eye syndrome of bilateral lacrimal glands: Secondary | ICD-10-CM | POA: Diagnosis not present

## 2022-04-23 ENCOUNTER — Ambulatory Visit (HOSPITAL_COMMUNITY)
Admission: RE | Admit: 2022-04-23 | Discharge: 2022-04-23 | Disposition: A | Discharge status: Home / Self Care | Payer: Medicare Other | Source: Ambulatory Visit | Attending: Internal Medicine | Admitting: Internal Medicine

## 2022-04-23 DIAGNOSIS — D420 Neoplasm of uncertain behavior of cerebral meninges: Secondary | ICD-10-CM | POA: Diagnosis not present

## 2022-04-23 DIAGNOSIS — G9389 Other specified disorders of brain: Secondary | ICD-10-CM | POA: Diagnosis not present

## 2022-04-23 DIAGNOSIS — C719 Malignant neoplasm of brain, unspecified: Secondary | ICD-10-CM | POA: Diagnosis not present

## 2022-04-23 MED ORDER — GADOBUTROL 1 MMOL/ML IV SOLN
7.0000 mL | Freq: Once | INTRAVENOUS | Status: AC | PRN
  Administered 2022-04-23: 7 mL via INTRAVENOUS

## 2022-04-26 ENCOUNTER — Other Ambulatory Visit: Payer: Self-pay

## 2022-04-26 ENCOUNTER — Inpatient Hospital Stay: Payer: Medicare Other | Attending: Internal Medicine | Admitting: Internal Medicine

## 2022-04-26 ENCOUNTER — Inpatient Hospital Stay: Payer: Medicare Other

## 2022-04-26 VITALS — BP 123/78 | HR 74 | Temp 98.4°F | Resp 15 | Wt 153.0 lb

## 2022-04-26 DIAGNOSIS — Z923 Personal history of irradiation: Secondary | ICD-10-CM | POA: Diagnosis not present

## 2022-04-26 DIAGNOSIS — Z87891 Personal history of nicotine dependence: Secondary | ICD-10-CM | POA: Diagnosis not present

## 2022-04-26 DIAGNOSIS — Z170 Estrogen receptor positive status [ER+]: Secondary | ICD-10-CM | POA: Diagnosis not present

## 2022-04-26 DIAGNOSIS — D420 Neoplasm of uncertain behavior of cerebral meninges: Secondary | ICD-10-CM | POA: Insufficient documentation

## 2022-04-26 DIAGNOSIS — Z5112 Encounter for antineoplastic immunotherapy: Secondary | ICD-10-CM | POA: Insufficient documentation

## 2022-04-26 DIAGNOSIS — Z79899 Other long term (current) drug therapy: Secondary | ICD-10-CM | POA: Diagnosis not present

## 2022-04-26 DIAGNOSIS — R569 Unspecified convulsions: Secondary | ICD-10-CM | POA: Insufficient documentation

## 2022-04-26 DIAGNOSIS — C50411 Malignant neoplasm of upper-outer quadrant of right female breast: Secondary | ICD-10-CM | POA: Diagnosis not present

## 2022-04-26 NOTE — Progress Notes (Signed)
[facility] [place], [place] [address] [phone]   Interval Evaluation  Date of Service: 04/26/22 Patient Name: Lori Mcmillan Patient MRN: [id number] Patient DOB: 07/29/1961 Provider: Ventura Sellers, MD  Identifying Statement:  Lori Mcmillan is a 61 y.o. 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 Isidore Moos  Interval History:  Lori Mcmillan presents today for follow up after recent MRI brain.  She describes 3-4 weeks history of daily headache today, no migrainous features.  Also has been having prolonged bouts of vertigo, last week it was on and off for several days.  Dosed a few days of prednisone which helped.  Otherwise no new or progressive deficits.  No seizures with the Lamictal.  Prior (05/03/18): Completed radiation at the end of March.  She has MRI brain for review today.  She describes no new neurologic deficits.  Does have frequent moderate headaches, has been taking Tylenol daily for the past 3 months.  Continues to have double vision which limits her ability to drive, read, perform some ADLs.  She has noted history of grade I breast cancer treated with radiation several years ago.  Medications: Current Outpatient Medications on File Prior to Visit  Medication Sig Dispense Refill   ALPRAZolam (XANAX) 0.25 MG tablet Take 1 tablet (0.25 mg total) by mouth 2 (two) times daily as needed for anxiety. 30 tablet 1   diazepam (VALIUM) 2 MG tablet Take 2 mg by mouth at bedtime as needed for anxiety.     lidocaine-prilocaine (EMLA) cream Apply to affected area once (Patient not taking: Reported on 04/26/2022) 30 g 3   predniSONE (DELTASONE) 10 MG tablet Take 10 mg by mouth as needed. For vertigo (Patient not  taking: Reported on 04/26/2022)     No current facility-administered medications on file prior to visit.    Allergies:  No Active Allergies  Past Medical History:  Past Medical History:  Diagnosis Date   Allergy    Anxiety    occ lorazepam   Atypical meningioma of brain ([facility]) 2018/2019   Resected, then RT Feb/Mar 2019.  No sign of residual dz at 04/2018 rad onc f/u and 10/2018 neuro-onc f/u. 03/2019 MRI brain->no resid/no recurrence.   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   Chicken pox    Depression    zoloft in past--?wt gain.   Diplopia    chronic (meningioma-related)   Eustachian tube dysfunction 09/05/2013   Family history of breast cancer    Family history of colon cancer    Family history of melanoma    Family history of ovarian cancer    Family history of pancreatic cancer    History of radiation therapy 02/24/12-04/11/12   right breast/ 45Gy'@1'$ .[id number]/boost=16Gy'@2'$  [id number]  Latest mammo and u/s 03/2013--normal.   History of radiation therapy 11/30/17- 01/11/18   Right temporal lobe treated to 55.8 Gy with 31 fx of 1.8 Gy   Hx of basal cell carcinoma    Migraine    Palpitations    has taken Metoprolol for palpitations in the past.   Seizure (HCC)    Taste impairment  s/p radiation therapy   Tobacco dependence 09/05/2013   Past Surgical History:  Past Surgical History:  Procedure Laterality Date   APPENDECTOMY  2008   emergency   BRAIN TUMOR EXCISION  2018   Meningioma   BREAST LUMPECTOMY  01/04/12   right breast   BREAST LUMPECTOMY WITH RADIOACTIVE SEED AND SENTINEL LYMPH NODE BIOPSY Right 06/18/2021   Procedure: RIGHT BREAST LUMPECTOMY WITH RADIOACTIVE SEED AND SENTINEL LYMPH NODE BIOPSY;  Surgeon: Erroll Luna, MD;  Location: [facility];  Service: General;  Laterality: Right;   CRANIECTOMY  10/19/2017   at [facility] Right 06/18/2021   Procedure: INSERTION PORT-A-CATH;  Surgeon: Erroll Luna, MD;  Location: [facility];  Service: General;  Laterality: Right;   WISDOM TOOTH EXTRACTION  1990   Social History:  Social History   Socioeconomic History   Marital status: Single    Spouse name: Not on file   Number of children: Not on file   Years of education: Not on file   Highest education level: Not on file  Occupational History   Not on file  Tobacco Use   Smoking status: Former    Packs/day: 0.50    Types: Cigarettes    Quit date: 10/17/2017    Years since quitting: 4.5   Smokeless tobacco: Never  Vaping Use   Vaping Use: Never used  Substance and Sexual Activity   Alcohol use: Yes    Alcohol/week: 3.0 standard drinks of alcohol    Types: 3 Cans of beer per week    Comment: 3-4 beers/day   Drug use: Not Currently   Sexual activity: Not Currently    Partners: Male  Other Topics Concern   Not on file  Social History Narrative   Marital status/children/pets: Single.  No children.  Lives alone.  Has pets.Orig from [place] in [place].   Education/employment: Bachelor's degree, retired   Engineer, materials:      -smoke alarm in the home:Yes     - wears seatbelt: Yes               Social Determinants of Health   Financial Resource Strain: Not on file  Food Insecurity: Not on file  Transportation Needs: Not on file  Physical Activity: Not on file  Stress: Not on file  Social Connections: Not on file  Intimate Partner Violence: Not on file   Family History:  Family History  Problem Relation Age of Onset   Anesthesia problems Mother    Breast cancer Mother 63       lumpectomy, chemo, radiation   Ovarian cancer Maternal Aunt 62   Cancer Maternal Aunt        unknown type   Breast cancer Maternal Aunt 80   Breast cancer Maternal Aunt 40       bilateral   Pancreatic cancer Maternal Uncle        dx 23s   Stomach cancer Paternal Uncle    Cancer  Paternal Uncle        unknown type   Lung cancer Paternal Uncle        hx smoking   Dementia Maternal Grandmother    Lung cancer Maternal Grandfather        hx smoking   Pancreatic cancer Paternal Grandmother 14   Melanoma Paternal Grandmother        dx >50, shin   Heart Problems Paternal Grandfather    Colon cancer Cousin 74  maternal first cousin   Breast cancer Cousin        dx 56s, paternal first cousin    Review of Systems: Constitutional: Denies fevers, chills or abnormal weight loss Eyes: Double vision Ears, nose, mouth, throat, and face: Denies mucositis or sore throat Respiratory: Denies cough, dyspnea or wheezes Cardiovascular: Denies palpitation, chest discomfort or lower extremity swelling Gastrointestinal:  Denies nausea, constipation, diarrhea GU: Denies dysuria or incontinence Skin: Denies abnormal skin rashes Neurological: Per HPI Musculoskeletal: Denies joint pain, back or neck discomfort. No decrease in ROM Behavioral/Psych: +anxiety  Physical Exam: Vitals:   04/26/22 0900  BP: 123/78  Pulse: 74  Resp: 15  Temp: 98.4 F (36.9 C)  SpO2: 95%    KPS: 90. General: Alert, cooperative, pleasant, in no acute distress Head: Craniotomy scar noted, dry and intact. EENT: No conjunctival injection or scleral icterus. Oral mucosa moist Lungs: Resp effort normal Cardiac: Regular rate and rhythm Abdomen: Soft, non-distended abdomen Skin: R T9 dermatomal rash Extremities: No clubbing or edema  Neurologic Exam: Mental Status: Awake, alert, attentive to examiner. Oriented to self and environment. Language is fluent with intact comprehension. Age advanced pyschomotor slowing with 2/3 object recall at 5 minutes. Cranial Nerves: Visual acuity is grossly normal. Visual fields are full. Extra-ocular movements intact with horizontal diplopia on left gaze. No ptosis. Face is symmetric, tongue midline.  Some sensory loss right>left face Motor: Tone and bulk are  normal. Power is full in both arms and legs. Reflexes are symmetric, no pathologic reflexes present. Intact finger to nose bilaterally Sensory: Intact to light touch and temperature Gait: Normal and tandem gait is normal.   Labs: I have reviewed the data as listed    Component Value Date/Time   NA 137 04/13/2022 0855   NA 140 11/23/2019 0000   K 3.8 04/13/2022 0855   CL 107 04/13/2022 0855   CO2 26 04/13/2022 0855   GLUCOSE 95 04/13/2022 0855   BUN 15 04/13/2022 0855   BUN 9 11/23/2019 0000   CREATININE 0.78 04/13/2022 0855   CREATININE 0.64 09/18/2013 0900   CALCIUM 9.3 04/13/2022 0855   PROT 6.7 04/13/2022 0855   ALBUMIN 4.2 04/13/2022 0855   AST 15 04/13/2022 0855   ALT 17 04/13/2022 0855   ALKPHOS 63 04/13/2022 0855   BILITOT 0.3 04/13/2022 0855   GFRNONAA >60 04/13/2022 0855   GFRAA 113 11/23/2019 0000   Lab Results  Component Value Date   WBC 7.9 04/13/2022   NEUTROABS 5.4 04/13/2022   HGB 11.6 (L) 04/13/2022   HCT 35.3 (L) 04/13/2022   MCV 89.6 04/13/2022   PLT 270 04/13/2022   Imaging:  [facility] Clinician Interpretation: I have personally reviewed the CNS images as listed.  My interpretation, in the context of the patient's clinical presentation, is stable disease  MR BRAIN W WO CONTRAST  Result Date: 04/25/2022 CLINICAL DATA:  Brain/CNS neoplasm, assess treatment response. EXAM: MRI HEAD WITHOUT AND WITH CONTRAST TECHNIQUE: Multiplanar, multiecho pulse sequences of the brain and surrounding structures were obtained without and with intravenous contrast. CONTRAST:  61m GADAVIST GADOBUTROL 1 MMOL/ML IV SOLN COMPARISON:  04/22/2021 FINDINGS: Brain: No acute infarct, mass effect or extra-axial collection. Unchanged large area of encephalomalacia of the right temporal lobe. Small foci of associated chronic blood are unchanged. Hyperintense T2-weighted signal surrounding the area of encephalomalacia is unchanged. There is no abnormal contrast enhancement at this resection  site. No new site of enhancement. Vascular: Major flow voids are preserved. Skull and upper cervical  spine: Remote right temporal craniotomy. Sinuses/Orbits:Chronic right mastoid effusion.  Normal orbits. IMPRESSION: Unchanged examination without evidence of recurrent or residual tumor. Electronically Signed   By: Ulyses Jarred M.D.   On: 04/25/2022 02:04   ECHOCARDIOGRAM COMPLETE  Result Date: 04/15/2022    ECHOCARDIOGRAM REPORT   Patient Name:   Lori Mcmillan Date of Exam: 04/15/2022 Medical Rec #:  [id number]          Height:       63.0 in Accession #:    [id number]         Weight:       154.4 lb Date of Birth:  [birth year]          BSA:          1.732 m Patient Age:    68 years           BP:           130/85 mmHg Patient Gender: F                  HR:           59 bpm. Exam Location:  Outpatient Procedure: 2D Echo, Cardiac Doppler, Color Doppler and Strain Analysis Indications:    Congestive Heart Failure; Breast Cancer  History:        Patient has prior history of Echocardiogram examinations, most                 recent 01/01/2022.  Sonographer:    Mikki Santee RDCS Referring Phys: CODY MATTHEWS IMPRESSIONS  1. Left ventricular ejection fraction, by estimation, is 55%. The left ventricle has normal function. The left ventricle has no regional wall motion abnormalities. Left ventricular diastolic parameters were normal.  2. Right ventricular systolic function is normal. The right ventricular size is normal.  3. The mitral valve is normal in structure. Trivial mitral valve regurgitation. No evidence of mitral stenosis.  4. The aortic valve is tricuspid. Aortic valve regurgitation is not visualized. No aortic stenosis is present.  5. The inferior vena cava is normal in size with greater than 50% respiratory variability, suggesting right atrial pressure of 3 mmHg. FINDINGS  Left Ventricle: Left ventricular ejection fraction, by estimation, is 55%. The left ventricle has normal function. The left ventricle  has no regional wall motion abnormalities. The left ventricular internal cavity size was normal in size. There is no left ventricular hypertrophy. Left ventricular diastolic parameters were normal. Right Ventricle: The right ventricular size is normal. No increase in right ventricular wall thickness. Right ventricular systolic function is normal. Left Atrium: Left atrial size was normal in size. Right Atrium: Right atrial size was normal in size. Pericardium: There is no evidence of pericardial effusion. Mitral Valve: The mitral valve is normal in structure. Trivial mitral valve regurgitation. No evidence of mitral valve stenosis. Tricuspid Valve: The tricuspid valve is normal in structure. Tricuspid valve regurgitation is not demonstrated. No evidence of tricuspid stenosis. Aortic Valve: The aortic valve is tricuspid. Aortic valve regurgitation is not visualized. No aortic stenosis is present. Pulmonic Valve: The pulmonic valve was normal in structure. Pulmonic valve regurgitation is mild. No evidence of pulmonic stenosis. Aorta: The aortic root is normal in size and structure. Venous: The inferior vena cava is normal in size with greater than 50% respiratory variability, suggesting right atrial pressure of 3 mmHg. IAS/Shunts: No atrial level shunt detected by color flow Doppler.  LEFT VENTRICLE PLAX 2D LVIDd:  4.30 cm     Diastology LVIDs:         3.10 cm     LV e' medial:    8.03 cm/s LV PW:         1.00 cm     LV E/e' medial:  8.4 LV IVS:        1.00 cm     LV e' lateral:   12.20 cm/s LVOT diam:     2.20 cm     LV E/e' lateral: 5.5 LV SV:         71 LV SV Index:   41          2D Longitudinal Strain LVOT Area:     3.80 cm    2D Strain GLS Avg:     -20.9 %  LV Volumes (MOD) LV vol d, MOD A2C: 89.6 ml LV vol d, MOD A4C: 65.7 ml LV vol s, MOD A2C: 40.3 ml LV vol s, MOD A4C: 28.5 ml LV SV MOD A2C:     49.3 ml LV SV MOD A4C:     65.7 ml LV SV MOD BP:      43.2 ml RIGHT VENTRICLE RV Basal diam:  3.30 cm RV Mid  diam:    3.00 cm RV S prime:     9.43 cm/s TAPSE (M-mode): 1.6 cm LEFT ATRIUM           Index        RIGHT ATRIUM           Index LA diam:      3.10 cm 1.79 cm/m   RA Area:     13.60 cm LA Vol (A2C): 48.3 ml 27.88 ml/m  RA Volume:   33.70 ml  19.45 ml/m LA Vol (A4C): 31.1 ml 17.95 ml/m  AORTIC VALVE LVOT Vmax:   99.00 cm/s LVOT Vmean:  60.400 cm/s LVOT VTI:    0.188 m  AORTA Ao Root diam: 3.00 cm Ao Asc diam:  3.30 cm MITRAL VALVE MV Area (PHT): 3.42 cm    SHUNTS MV Decel Time: 222 msec    Systemic VTI:  0.19 m MV E velocity: 67.30 cm/s  Systemic Diam: 2.20 cm MV A velocity: 59.90 cm/s MV E/A ratio:  1.12 Jenkins Rouge MD Electronically signed by Jenkins Rouge MD Signature Date/Time: 04/15/2022/10:11:27 AM    Final      Assessment/Plan Atypical meningioma of brain ([place])  Seizures ([facility])  Lori Mcmillan is clinically and radiographically stable today.    For headaches, recommended improved sleep hygiene, provided counseling on this.  We offered Elavil HS but she elected to defer this.  For vertigo, may dose Meclizine '25mg'$  q8 PRN.  Valium '5mg'$  may be used as adjunct if needed.  Next MRI scan can otherwise be in 1 year.   Continues on herceptin with Dr. Lindi Adie.  We appreciate the opportunity to participate in the care of Lori Mcmillan.   All questions were answered. The patient knows to call the clinic with any problems, questions or concerns. No barriers to learning were detected.  I have spent a total of 30 minutes of face-to-face and non-face-to-face time, excluding clinical staff time, preparing to see patient, ordering tests and/or medications, counseling the patient, and independently interpreting results and communicating results to the patient/family/caregiver    Ventura Sellers, MD Medical Director of Neuro-Oncology [facility] [place] 04/26/22 9:29 AM

## 2022-05-03 ENCOUNTER — Other Ambulatory Visit: Payer: Self-pay

## 2022-05-03 NOTE — Progress Notes (Signed)
Signed orders for diag MM faxed to [facility] per pt request. She verbalized thanks.

## 2022-05-04 ENCOUNTER — Other Ambulatory Visit: Payer: Self-pay

## 2022-05-04 ENCOUNTER — Inpatient Hospital Stay: Payer: Medicare Other

## 2022-05-04 VITALS — BP 107/83 | HR 86 | Temp 98.0°F | Resp 19 | Wt 151.5 lb

## 2022-05-04 DIAGNOSIS — Z87891 Personal history of nicotine dependence: Secondary | ICD-10-CM | POA: Diagnosis not present

## 2022-05-04 DIAGNOSIS — R569 Unspecified convulsions: Secondary | ICD-10-CM | POA: Diagnosis not present

## 2022-05-04 DIAGNOSIS — C50411 Malignant neoplasm of upper-outer quadrant of right female breast: Secondary | ICD-10-CM | POA: Diagnosis not present

## 2022-05-04 DIAGNOSIS — Z5112 Encounter for antineoplastic immunotherapy: Secondary | ICD-10-CM | POA: Diagnosis not present

## 2022-05-04 DIAGNOSIS — Z79899 Other long term (current) drug therapy: Secondary | ICD-10-CM | POA: Diagnosis not present

## 2022-05-04 DIAGNOSIS — Z923 Personal history of irradiation: Secondary | ICD-10-CM | POA: Diagnosis not present

## 2022-05-04 DIAGNOSIS — Z170 Estrogen receptor positive status [ER+]: Secondary | ICD-10-CM | POA: Diagnosis not present

## 2022-05-04 DIAGNOSIS — D420 Neoplasm of uncertain behavior of cerebral meninges: Secondary | ICD-10-CM | POA: Diagnosis not present

## 2022-05-04 MED ORDER — ACETAMINOPHEN 325 MG PO TABS
650.0000 mg | ORAL_TABLET | Freq: Once | ORAL | Status: AC
  Administered 2022-05-04: 650 mg via ORAL
  Filled 2022-05-04: qty 2

## 2022-05-04 MED ORDER — SODIUM CHLORIDE 0.9% FLUSH
10.0000 mL | INTRAVENOUS | Status: DC | PRN
  Administered 2022-05-04: 10 mL

## 2022-05-04 MED ORDER — SODIUM CHLORIDE 0.9 % IV SOLN: Freq: Once | INTRAVENOUS | Status: AC

## 2022-05-04 MED ORDER — DIPHENHYDRAMINE HCL 25 MG PO CAPS
25.0000 mg | ORAL_CAPSULE | Freq: Once | ORAL | Status: AC
  Administered 2022-05-04: 25 mg via ORAL
  Filled 2022-05-04: qty 1

## 2022-05-04 MED ORDER — TRASTUZUMAB-ANNS CHEMO 150 MG IV SOLR
6.0000 mg/kg | Freq: Once | INTRAVENOUS | Status: AC
  Administered 2022-05-04: 441 mg via INTRAVENOUS
  Filled 2022-05-04: qty 21

## 2022-05-04 MED ORDER — HEPARIN SOD (PORK) LOCK FLUSH 100 UNIT/ML IV SOLN
500.0000 [IU] | Freq: Once | INTRAVENOUS | Status: AC | PRN
  Administered 2022-05-04: 500 [IU]

## 2022-05-04 NOTE — Patient Instructions (Signed)
[facility]  Discharge Instructions: Thank you for choosing [facility] to provide your oncology and hematology care.   If you have a lab appointment with the Cancer Center, please go directly to the Cancer Center and check in at the registration area.   Wear comfortable clothing and clothing appropriate for easy access to any Portacath or PICC line.   We strive to give you quality time with your provider. You may need to reschedule your appointment if you arrive late (15 or more minutes).  Arriving late affects you and other patients whose appointments are after yours.  Also, if you miss three or more appointments without notifying the office, you may be dismissed from the clinic at the provider's discretion.      For prescription refill requests, have your pharmacy contact our office and allow 72 hours for refills to be completed.    Today you received the following chemotherapy and/or immunotherapy agents: trastuzumab       To help prevent nausea and vomiting after your treatment, we encourage you to take your nausea medication as directed.  BELOW ARE SYMPTOMS THAT SHOULD BE REPORTED IMMEDIATELY: *FEVER GREATER THAN 100.4 F (38 C) OR HIGHER *CHILLS OR SWEATING *NAUSEA AND VOMITING THAT IS NOT CONTROLLED WITH YOUR NAUSEA MEDICATION *UNUSUAL SHORTNESS OF BREATH *UNUSUAL BRUISING OR BLEEDING *URINARY PROBLEMS (pain or burning when urinating, or frequent urination) *BOWEL PROBLEMS (unusual diarrhea, constipation, pain near the anus) TENDERNESS IN MOUTH AND THROAT WITH OR WITHOUT PRESENCE OF ULCERS (sore throat, sores in mouth, or a toothache) UNUSUAL RASH, SWELLING OR PAIN  UNUSUAL VAGINAL DISCHARGE OR ITCHING   Items with * indicate a potential emergency and should be followed up as soon as possible or go to the Emergency Department if any problems should occur.  Please show the CHEMOTHERAPY ALERT CARD or IMMUNOTHERAPY ALERT CARD at check-in  to the Emergency Department and triage nurse.  Should you have questions after your visit or need to cancel or reschedule your appointment, please contact [facility]  Dept: [phone]  and follow the prompts.  Office hours are 8:00 a.m. to 4:30 p.m. Monday - Friday. Please note that voicemails left after 4:00 p.m. may not be returned until the following business day.  We are closed weekends and major holidays. You have access to a nurse at all times for urgent questions. Please call the main number to the clinic Dept: [phone] and follow the prompts.   For any non-urgent questions, you may also contact your provider using MyChart. We now offer e-Visits for anyone 18 and older to request care online for non-urgent symptoms. For details visit [email or web address].   Also download the MyChart app! Go to the app store, search "MyChart", open the app, select , and log in with your MyChart username and password.  Masks are optional in the cancer centers. If you would like for your care team to wear a mask while they are taking care of you, please let them know. For doctor visits, patients may have with them one support person who is at least 59 years old. At this time, visitors are not allowed in the infusion area. 

## 2022-05-07 ENCOUNTER — Encounter: Payer: Self-pay | Admitting: Hematology and Oncology

## 2022-05-07 DIAGNOSIS — N644 Mastodynia: Secondary | ICD-10-CM | POA: Diagnosis not present

## 2022-05-10 ENCOUNTER — Other Ambulatory Visit: Payer: Self-pay

## 2022-05-12 ENCOUNTER — Encounter: Payer: Self-pay | Admitting: Family Medicine

## 2022-05-12 ENCOUNTER — Ambulatory Visit (INDEPENDENT_AMBULATORY_CARE_PROVIDER_SITE_OTHER): Payer: Medicare Other | Admitting: Family Medicine

## 2022-05-12 VITALS — BP 107/76 | HR 84 | Temp 98.7°F | Ht 63.0 in | Wt 150.0 lb

## 2022-05-12 DIAGNOSIS — L03011 Cellulitis of right finger: Secondary | ICD-10-CM

## 2022-05-12 MED ORDER — DOXYCYCLINE HYCLATE 100 MG PO TABS: 100.0000 mg | ORAL_TABLET | Freq: Two times a day (BID) | ORAL | 0 refills | Status: AC

## 2022-05-12 NOTE — Patient Instructions (Addendum)
Send mychart message next week about how finger is doing   Finger Nails: dashing diva nails at [employer]

## 2022-05-12 NOTE — Assessment & Plan Note (Signed)
-   started pt on doxy for 7 days  - recommended she mychart message me to see how she is doing after one week of abx and make an apt if worsening or failing to improve - counseled on not getting her nails done at the salon and provided other options  - also discussed she let her oncologist know of her infection and the doxy  - provided ED precautions

## 2022-05-12 NOTE — Progress Notes (Signed)
   Acute Office Visit  Subjective:     Patient ID: Lori Mcmillan, female    DOB: 1960/12/25, 61 y.o.   MRN: [id number]  Pt presents with pain and concerns of infection in her middle finger on her right hand. She had her nails done a few weeks ago. She has been soaking in epsom salt and hot water for the past few weeks. She admits to swelling and tenderness to the area. She is currently on chemo regularly for breast and brain cancer and has her next transfusion on August 8th. Denies fever, chills, other systemic symptoms.     Review of Systems  Constitutional:  Negative for chills and fever.  Respiratory:  Negative for cough and shortness of breath.   Cardiovascular:  Negative for chest pain.  Neurological:  Negative for headaches.        Objective:    BP 107/76   Pulse 84   Temp 98.7 F (37.1 C)   Ht '5\' 3"'$  (1.6 m)   Wt 150 lb (68 kg)   LMP 05/11/2013   SpO2 96%   BMI 26.57 kg/m    Physical Exam Vitals and nursing note reviewed.  Constitutional:      General: She is not in acute distress.    Appearance: Normal appearance.  HENT:     Head: Normocephalic and atraumatic.     Right Ear: External ear normal.     Left Ear: External ear normal.     Nose: Nose normal.  Eyes:     Conjunctiva/sclera: Conjunctivae normal.  Cardiovascular:     Rate and Rhythm: Normal rate.  Pulmonary:     Effort: Pulmonary effort is normal.  Skin:    Comments: Middle finger of right hand is tender to palpation with some erythema. Mild skin flaking. No purulent discharge. Good peripheral pulse   Neurological:     General: No focal deficit present.     Mental Status: She is alert and oriented to person, place, and time.  Psychiatric:        Mood and Affect: Mood normal.        Behavior: Behavior normal.        Thought Content: Thought content normal.        Judgment: Judgment normal.     No results found for any visits on 05/12/22.      Assessment & Plan:   Problem List Items  Addressed This Visit       Other   Cellulitis of finger of right hand - Primary    - started pt on doxy for 7 days  - recommended she mychart message me to see how she is doing after one week of abx and make an apt if worsening or failing to improve - counseled on not getting her nails done at the salon and provided other options  - also discussed she let her oncologist know of her infection and the doxy  - provided ED precautions      Relevant Medications   doxycycline (VIBRA-TABS) 100 MG tablet    Meds ordered this encounter  Medications   doxycycline (VIBRA-TABS) 100 MG tablet    Sig: Take 1 tablet (100 mg total) by mouth 2 (two) times daily for 7 days.    Dispense:  14 tablet    Refill:  0    Return if symptoms worsen or fail to improve.  Owens Loffler, DO

## 2022-05-17 NOTE — Progress Notes (Signed)
Patient Care Team: Luetta Nutting, DO as PCP - General (Family Medicine) Eppie Gibson, MD as Consulting Physician (Radiation Oncology) Mickeal Skinner, Acey Lav, MD as Consulting Physician (Oncology) [facility] (Ophthalmology) Bridget Hartshorn, Noel Journey, MD as Referring Physician (Dermatology) Helayne Seminole, MD as Referring Physician (Otolaryngology) Mauro Kaufmann, RN as Oncology Nurse Navigator Rockwell Germany, RN as Oncology Nurse Navigator  DIAGNOSIS:  Encounter Diagnosis  Name Primary?   Malignant neoplasm of upper-outer quadrant of right female breast, unspecified estrogen receptor status ([place])     SUMMARY OF ONCOLOGIC HISTORY: Oncology History  Cancer of upper-outer quadrant of female breast ([place])  2013 Initial Diagnosis   Right breast cancer: Stage Ia ER/PR positive HER2 negative status postlumpectomy, radiation (low risk Oncotype) could not tolerate tamoxifen for more than 30 days.   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  Biopsy revealed grade 3 IDC ER/PR negative HER2 positive with a Ki-67 of 30%   05/26/2021 Cancer Staging   Staging form: Breast, AJCC 7th Edition - Clinical stage from 05/26/2021: Stage Unknown (T1c, NX, cM0) - Signed by Eppie Gibson, MD on 05/28/2021 Specimen type: Core Needle Biopsy Stage prefix: Initial diagnosis Laterality: Right Tumor grade (Scarff-Bloom-Richardson system): G3 Estrogen receptor status: Negative Progesterone receptor status: Negative HER2 status: Positive Staging comments: Staged at Breast Cancer Conference 12/08/11    06/18/2021 Surgery   Right lumpectomy: Grade 3 IDC 1.5 cm, high-grade DCIS, margins negative, 0/2 lymph nodes negative, ER 0%, PR 0%, HER2 3+ positive, Ki-67 30%    Genetic Testing    Negative genetic testing. No pathogenic variants identified on the [facility] Multi-Cancer Panel+RNA. VUS in POLD1 identified called c.2429C>T. The report date is 07/01/2021.  The Multi-Cancer Panel + RNA offered by [facility] includes sequencing and/or deletion duplication testing of the following 84 genes: AIP, ALK, APC, ATM, AXIN2,BAP1,  BARD1, BLM, BMPR1A, BRCA1, BRCA2, BRIP1, CASR, CDC73, CDH1, CDK4, CDKN1B, CDKN1C, CDKN2A (p14ARF), CDKN2A (p16INK4a), CEBPA, CHEK2, CTNNA1, DICER1, DIS3L2, EGFR (c.2369C>T, p.Thr790Met variant only), EPCAM (Deletion/duplication testing only), FH, FLCN, GATA2, GPC3, GREM1 (Promoter region deletion/duplication testing only), HOXB13 (c.251G>A, p.Gly84Glu), HRAS, KIT, MAX, MEN1, MET, MITF (c.952G>A, p.Glu318Lys variant only), MLH1, MSH2, MSH3, MSH6, MUTYH, NBN, NF1, NF2, NTHL1, PALB2, PDGFRA, PHOX2B, PMS2, POLD1, POLE, POT1, PRKAR1A, PTCH1, PTEN, RAD50, RAD51C, RAD51D, RB1, RECQL4, RET, RUNX1, SDHAF2, SDHA (sequence changes only), SDHB, SDHC, SDHD, SMAD4, SMARCA4, SMARCB1, SMARCE1, STK11, SUFU, TERC, TERT, TMEM127, TP53, TSC1, TSC2, VHL, WRN and WT1.   07/21/2021 -  Chemotherapy   Patient is on Treatment Plan : BREAST Paclitaxel + Trastuzumab q7d / Trastuzumab q21d     11/25/2021 - 01/04/2022 Radiation Therapy   Adj Radiation with Dr.Squire     CHIEF COMPLIANT: Herceptin maintenance    INTERVAL HISTORY: Lori Mcmillan is a 61 y.o. with above-mentioned history of  HER2 positive breast cancer. She presents to the clinic today for a follow-up. She states that she is having trouble sleeping and she is having some fatigue. She does get groggy from not sleeping.She does go to bed at the same time every night and turns off everything. Overall she is doing fine.   ALLERGIES:  has no active allergies.  MEDICATIONS:  Current Outpatient Medications  Medication Sig Dispense Refill   ALPRAZolam (XANAX) 0.25 MG tablet  Take 1 tablet (0.25 mg total) by mouth 2 (two) times daily as  needed for anxiety. 30 tablet 1   diazepam (VALIUM) 2 MG tablet Take 2 mg by mouth at bedtime as needed for anxiety.     lidocaine-prilocaine (EMLA) cream Apply to affected area once 30 g 3   No current facility-administered medications for this visit.    PHYSICAL EXAMINATION: ECOG PERFORMANCE STATUS: 1 - Symptomatic but completely ambulatory  Vitals:   05/25/22 0919  BP: 135/75  Pulse: 65  Resp: 17  Temp: (!) 97.3 F (36.3 C)  SpO2: 99%   Filed Weights   05/25/22 0919  Weight: 150 lb 11.2 oz (68.4 kg)      LABORATORY DATA:  I have reviewed the data as listed    Latest Ref Rng & Units 04/13/2022    8:55 AM 03/02/2022   10:01 AM 01/19/2022    9:29 AM  CMP  Glucose 70 - 99 mg/dL 95  88  89   BUN 6 - 20 mg/dL _0 Creatinine 0.44 - 1.00 mg/dL 0.78  0.71  0.69   Sodium 135 - 145 mmol/L 137  138  138   Potassium 3.5 - 5.1 mmol/L 3.8  4.0  3.8   Chloride 98 - 111 mmol/L 107  106  107   CO2 22 - 32 mmol/L _1 Calcium 8.9 - 10.3 mg/dL 9.3  8.7  9.2   Total Protein 6.5 - 8.1 g/dL 6.7  6.6  6.9   Total Bilirubin 0.3 - 1.2 mg/dL 0.3  0.3  0.3   Alkaline Phos 38 - 126 U/L 63  69  71   AST 15 - 41 U/L _2 ALT 0 - 44 U/L _3 Lab Results  Component Value Date   WBC 6.4 05/25/2022   HGB 11.6 (L) 05/25/2022   HCT 35.5 (L) 05/25/2022   MCV 87.9 05/25/2022   PLT 277 05/25/2022   NEUTROABS 3.6 05/25/2022    ASSESSMENT & PLAN:  Cancer of upper-outer quadrant of female breast ([place]) 06/18/2021:Right lumpectomy: 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  Adjuvant chemotherapy with Taxol and Herceptin followed by Herceptin maintenance 2. breast radiation completed  01/04/2022 ----------------------------------------------------------------------------------------------------------------------------------- Current treatment: Completed 12 cycles of Taxol Herceptin, currently on Herceptin maintenance (last treatment 07/06/2022)   Chemo toxicities: Fatigue: Much improved 2. Chemotherapy-induced anemia: Today's hemoglobin is 11.6 Echo 04/15/2022: EF 55%   Return to clinic every 3 weeks for Herceptin every 6 weeks with follow-up with me.  In 6 weeks she will complete her Herceptin treatment.  I sent a message to Dr. Brantley Stage to remove her port after that.      No orders of the defined types were placed in this encounter.  The patient has a good understanding of the overall plan. she agrees with it. she will call with any problems that may develop before the next visit here. Total time spent: 30 mins including face to face time and time spent for planning, charting and co-ordination of care   Harriette Ohara, MD 05/25/22    I Deritra,  Mcnairy am scribing for Dr. Lindi Adie  I have reviewed the above documentation for accuracy and completeness, and I agree with the above.

## 2022-05-24 NOTE — Assessment & Plan Note (Signed)
06/18/2021:Right lumpectomy: Grade 3 IDC 1.5 cm, high-grade DCIS, margins negative, 0/2 lymph nodes negative, ER 0%, PR 0%, HER2 3+ positive, Ki-67 30%  (2013Right breast cancer: Stage Ia ER/PR positive HER2 negative status postlumpectomy, radiation (low risk Oncotype) could not tolerate tamoxifen for more than 30 days.)  Treatment plan: 1.Adjuvant chemotherapy with Taxol and Herceptin followed by Herceptin maintenance 2.breast radiation completed 01/04/2022 ----------------------------------------------------------------------------------------------------------------------------------- Current treatment:Completed 12 cycles ofTaxol Herceptin,currently onHerceptin maintenance(last treatment 07/06/2022) Echocardiogram: EF50-55%(okay to treat)   Chemo toxicities: 1. Fatigue:Much improved 2.Chemotherapy-induced anemia: Today's hemoglobin is 11.6 She has an echocardiogram and a follow-up with Dr. Haroldine Laws.  Return to clinicevery 3 weeks for Herceptin every 6 weeks with follow-up with me.

## 2022-05-25 ENCOUNTER — Inpatient Hospital Stay: Payer: Medicare Other | Attending: Internal Medicine

## 2022-05-25 ENCOUNTER — Inpatient Hospital Stay: Payer: Medicare Other

## 2022-05-25 ENCOUNTER — Other Ambulatory Visit: Payer: Self-pay

## 2022-05-25 ENCOUNTER — Inpatient Hospital Stay (HOSPITAL_BASED_OUTPATIENT_CLINIC_OR_DEPARTMENT_OTHER): Payer: Medicare Other | Admitting: Hematology and Oncology

## 2022-05-25 DIAGNOSIS — D6481 Anemia due to antineoplastic chemotherapy: Secondary | ICD-10-CM | POA: Insufficient documentation

## 2022-05-25 DIAGNOSIS — Z923 Personal history of irradiation: Secondary | ICD-10-CM | POA: Insufficient documentation

## 2022-05-25 DIAGNOSIS — Z5112 Encounter for antineoplastic immunotherapy: Secondary | ICD-10-CM | POA: Insufficient documentation

## 2022-05-25 DIAGNOSIS — C50411 Malignant neoplasm of upper-outer quadrant of right female breast: Secondary | ICD-10-CM | POA: Diagnosis not present

## 2022-05-25 DIAGNOSIS — Z170 Estrogen receptor positive status [ER+]: Secondary | ICD-10-CM | POA: Insufficient documentation

## 2022-05-25 DIAGNOSIS — Z5111 Encounter for antineoplastic chemotherapy: Secondary | ICD-10-CM | POA: Diagnosis not present

## 2022-05-25 LAB — CBC WITH DIFFERENTIAL (CANCER CENTER ONLY)
Abs Immature Granulocytes: 0.01 10*3/uL (ref 0.00–0.07)
Basophils Absolute: 0 10*3/uL (ref 0.0–0.1)
Basophils Relative: 1 %
Eosinophils Absolute: 0.2 10*3/uL (ref 0.0–0.5)
Eosinophils Relative: 3 %
HCT: 35.5 % — ABNORMAL LOW (ref 36.0–46.0)
Hemoglobin: 11.6 g/dL — ABNORMAL LOW (ref 12.0–15.0)
Immature Granulocytes: 0 %
Lymphocytes Relative: 30 %
Lymphs Abs: 1.9 10*3/uL (ref 0.7–4.0)
MCH: 28.7 pg (ref 26.0–34.0)
MCHC: 32.7 g/dL (ref 30.0–36.0)
MCV: 87.9 fL (ref 80.0–100.0)
Monocytes Absolute: 0.7 10*3/uL (ref 0.1–1.0)
Monocytes Relative: 10 %
Neutro Abs: 3.6 10*3/uL (ref 1.7–7.7)
Neutrophils Relative %: 56 %
Platelet Count: 277 10*3/uL (ref 150–400)
RBC: 4.04 MIL/uL (ref 3.87–5.11)
RDW: 12.9 % (ref 11.5–15.5)
WBC Count: 6.4 10*3/uL (ref 4.0–10.5)
nRBC: 0 % (ref 0.0–0.2)

## 2022-05-25 LAB — CMP (CANCER CENTER ONLY)
ALT: 9 U/L (ref 0–44)
AST: 12 U/L — ABNORMAL LOW (ref 15–41)
Albumin: 4.2 g/dL (ref 3.5–5.0)
Alkaline Phosphatase: 72 U/L (ref 38–126)
Anion gap: 4 — ABNORMAL LOW (ref 5–15)
BUN: 12 mg/dL (ref 6–20)
CO2: 26 mmol/L (ref 22–32)
Calcium: 8.8 mg/dL — ABNORMAL LOW (ref 8.9–10.3)
Chloride: 106 mmol/L (ref 98–111)
Creatinine: 0.68 mg/dL (ref 0.44–1.00)
GFR, Estimated: >60 mL/min (ref >60)
Glucose, Bld: 97 mg/dL (ref 70–99)
Potassium: 4.3 mmol/L (ref 3.5–5.1)
Sodium: 136 mmol/L (ref 135–145)
Total Bilirubin: 0.3 mg/dL (ref 0.3–1.2)
Total Protein: 7 g/dL (ref 6.5–8.1)

## 2022-05-25 MED ORDER — HEPARIN SOD (PORK) LOCK FLUSH 100 UNIT/ML IV SOLN
500.0000 [IU] | Freq: Once | INTRAVENOUS | Status: AC | PRN
  Administered 2022-05-25: 500 [IU]

## 2022-05-25 MED ORDER — SODIUM CHLORIDE 0.9% FLUSH
10.0000 mL | INTRAVENOUS | Status: DC | PRN
  Administered 2022-05-25: 10 mL

## 2022-05-25 MED ORDER — DIPHENHYDRAMINE HCL 25 MG PO CAPS
25.0000 mg | ORAL_CAPSULE | Freq: Once | ORAL | Status: AC
  Administered 2022-05-25: 25 mg via ORAL
  Filled 2022-05-25: qty 1

## 2022-05-25 MED ORDER — TRASTUZUMAB-ANNS CHEMO 150 MG IV SOLR
6.0000 mg/kg | Freq: Once | INTRAVENOUS | Status: AC
  Administered 2022-05-25: 441 mg via INTRAVENOUS
  Filled 2022-05-25: qty 21

## 2022-05-25 MED ORDER — SODIUM CHLORIDE 0.9 % IV SOLN: Freq: Once | INTRAVENOUS | Status: AC

## 2022-05-25 MED ORDER — ACETAMINOPHEN 325 MG PO TABS
650.0000 mg | ORAL_TABLET | Freq: Once | ORAL | Status: AC
  Administered 2022-05-25: 650 mg via ORAL
  Filled 2022-05-25: qty 2

## 2022-05-25 NOTE — Patient Instructions (Signed)
[facility]COL[facility]  Discharge Instructions: Thank you for choosing [facility] to provide your oncology and hematology care.   If you have a lab appointment with the Cancer Center, please go directly to the Cancer Center and check in at the registration area.   Wear comfortable clothing and clothing appropriate for easy access to any Portacath or PICC line.   We strive to give you quality time with your provider. You may need to reschedule your appointment if you arrive late (15 or more minutes).  Arriving late affects you and other patients whose appointments are after yours.  Also, if you miss three or more appointments without notifying the office, you may be dismissed from the clinic at the provider's discretion.      For prescription refill requests, have your pharmacy contact our office and allow 72 hours for refills to be completed.    Today you received the following chemotherapy and/or immunotherapy agents: trastuzumab-anns      To help prevent nausea and vomiting after your treatment, we encourage you to take your nausea medication as directed.  BELOW ARE SYMPTOMS THAT SHOULD BE REPORTED IMMEDIATELY: *FEVER GREATER THAN 100.4 F (38 C) OR HIGHER *CHILLS OR SWEATING *NAUSEA AND VOMITING THAT IS NOT CONTROLLED WITH YOUR NAUSEA MEDICATION *UNUSUAL SHORTNESS OF BREATH *UNUSUAL BRUISING OR BLEEDING *URINARY PROBLEMS (pain or burning when urinating, or frequent urination) *BOWEL PROBLEMS (unusual diarrhea, constipation, pain near the anus) TENDERNESS IN MOUTH AND THROAT WITH OR WITHOUT PRESENCE OF ULCERS (sore throat, sores in mouth, or a toothache) UNUSUAL RASH, SWELLING OR PAIN  UNUSUAL VAGINAL DISCHARGE OR ITCHING   Items with * indicate a potential emergency and should be followed up as soon as possible or go to the Emergency Department if any problems should occur.  Please show the CHEMOTHERAPY ALERT CARD or IMMUNOTHERAPY ALERT CARD at  check-in to the Emergency Department and triage nurse.  Should you have questions after your visit or need to cancel or reschedule your appointment, please contact [facility]  Dept: [phone]  and follow the prompts.  Office hours are 8:00 a.m. to 4:30 p.m. Monday - Friday. Please note that voicemails left after 4:00 p.m. may not be returned until the following business day.  We are closed weekends and major holidays. You have access to a nurse at all times for urgent questions. Please call the main number to the clinic Dept: [phone] and follow the prompts.   For any non-urgent questions, you may also contact your provider using MyChart. We now offer e-Visits for anyone 18 and older to request care online for non-urgent symptoms. For details visit [email or web address].   Also download the MyChart app! Go to the app store, search "MyChart", open the app, select [facility], and log in with your MyChart username and password.  Masks are optional in the cancer centers. If you would like for your care team to wear a mask while they are taking care of you, please let them know. You may have one support person who is at least 59 years old accompany you for your appointments. 

## 2022-05-31 DIAGNOSIS — C50411 Malignant neoplasm of upper-outer quadrant of right female breast: Secondary | ICD-10-CM | POA: Diagnosis not present

## 2022-05-31 DIAGNOSIS — Z171 Estrogen receptor negative status [ER-]: Secondary | ICD-10-CM | POA: Diagnosis not present

## 2022-05-31 DIAGNOSIS — Z95828 Presence of other vascular implants and grafts: Secondary | ICD-10-CM | POA: Diagnosis not present

## 2022-06-01 ENCOUNTER — Telehealth: Payer: Self-pay | Admitting: *Deleted

## 2022-06-01 NOTE — Telephone Encounter (Signed)
Returned call from 8:53 AM. Left patient a message to call and schedule New GYN appointment with Dr. Rosana Hoes for Monday, 06/11/22 at 9:10 or 2:10 if able.

## 2022-06-09 ENCOUNTER — Encounter: Payer: Self-pay | Admitting: General Practice

## 2022-06-10 ENCOUNTER — Other Ambulatory Visit (HOSPITAL_COMMUNITY)
Admission: RE | Admit: 2022-06-10 | Discharge: 2022-06-10 | Disposition: A | Discharge status: Home / Self Care | Payer: Medicare Other | Source: Ambulatory Visit | Attending: Obstetrics and Gynecology | Admitting: Obstetrics and Gynecology

## 2022-06-10 ENCOUNTER — Encounter: Payer: Self-pay | Admitting: Obstetrics and Gynecology

## 2022-06-10 ENCOUNTER — Ambulatory Visit (INDEPENDENT_AMBULATORY_CARE_PROVIDER_SITE_OTHER): Payer: Medicare Other | Admitting: Obstetrics and Gynecology

## 2022-06-10 VITALS — BP 119/76 | HR 69 | Wt 149.0 lb

## 2022-06-10 DIAGNOSIS — Z1151 Encounter for screening for human papillomavirus (HPV): Secondary | ICD-10-CM | POA: Diagnosis not present

## 2022-06-10 DIAGNOSIS — Z0000 Encounter for general adult medical examination without abnormal findings: Secondary | ICD-10-CM

## 2022-06-10 DIAGNOSIS — Z01419 Encounter for gynecological examination (general) (routine) without abnormal findings: Secondary | ICD-10-CM | POA: Diagnosis present

## 2022-06-10 NOTE — Progress Notes (Signed)
Subjective:     Lori Mcmillan is a 61 y.o. female P0 postmenopausal with BMI 26 who is here for a comprehensive physical exam. The patient reports no problems. She denies any episodes of postmenopausal vaginal bleeding. She is not sexually active. She denies pelvic pain or abnormal discharge.   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  No sign of residual dz at 04/2018 rad onc f/u and 10/2018 neuro-onc f/u. 03/2019 MRI brain->no resid/no recurrence.   Brain embolism and thrombosis    Breast cancer ([place])    Rt breast; 1.5 cm low grade invasive ductal carcinoma status post lumpectomy with sentinel node biopsy on 01/04/2012.   Chicken pox    Depression    zoloft in past--?wt gain.   Diplopia    chronic (meningioma-related)   Eustachian tube dysfunction 09/05/2013   Family history of breast cancer    Family history of colon cancer    Family history of melanoma    Family history of ovarian cancer    Family history of pancreatic cancer    History of radiation therapy 02/24/12-04/11/12   right breast/ 45Gy'@1'$ .8Gyx57f/boost=16Gy'@2'$  [id number]  Latest mammo and u/s 03/2013--normal.   History of radiation therapy 11/30/17- 01/11/18   Right temporal lobe treated to 55.8 Gy with 31 fx of 1.8 Gy   Hx of basal cell carcinoma    Migraine    Palpitations    has taken Metoprolol for palpitations in the past.   [place]izure [place]   Taste impairment    s/p radiation therapy   Tobacco dependence 09/05/2013   Past Surgical History:  Procedure Laterality Date   APPENDECTOMY  2008   emergency   BRAIN TUMOR EXCISION  2018   Meningioma   BREAST LUMPECTOMY  01/04/12   right breast   BREAST LUMPECTOMY WITH RADIOACTIVE SEED AND SENTINEL LYMPH NODE BIOPSY Right 06/18/2021   Procedure: RIGHT BREAST LUMPECTOMY WITH RADIOACTIVE SEED AND SENTINEL LYMPH NODE BIOPSY;  Surgeon: CoErroll LunaMD;   [facility]: [facility];  Laterality: Right;   CRANIECTOMY  10/19/2017   [facility]   Procedure: INSERTION PORT-A-CATH;  Surgeon: CoErroll LunaMD;  [facility] Service: General;  Laterality: Right;   WIGlen Ellyn Family History  Problem Relation Age of Onset   Anesthesia problems Mother    Breast cancer Mother [id number]     lumpectomy, chemo, radiation   Ovarian cancer Maternal Aunt 7075 Cancer Maternal Aunt        unknown type   Breast cancer Maternal Aunt 80   Breast cancer Maternal Aunt 40       bilateral   Pancreatic cancer Maternal Uncle        dx 7075s Stomach cancer Paternal Uncle    Cancer Paternal Uncle        unknown type   Lung cancer Paternal Uncle        hx smoking   Dementia Maternal Grandmother    Lung cancer Maternal Grandfather        hx smoking   Pancreatic cancer Paternal Grandmother [id number] Melanoma Paternal Grandmother        dx >50, shin   Heart Problems Paternal Grandfather    Colon  cancer Cousin 80       maternal first cousin   Breast cancer Cousin        dx 67s, paternal first cousin     Social History   Socioeconomic History   Marital status: Single    Spouse name: Not on file   Number of children: Not on file   Years of education: Not on file   Highest education level: Not on file  Occupational History   Not on file  Tobacco Use   Smoking status: Former    Packs/day: 0.50    Types: Cigarettes    Quit date: 10/17/2017    Years since quitting: 4.6   Smokeless tobacco: Never  Vaping Use   Vaping Use: Never used  Substance and Sexual Activity   Alcohol use: Yes    Alcohol/week: 3.0 standard drinks of alcohol    Types: 3 Cans of beer per week    Comment: 3-4 beers/day   Drug use: Not Currently   Sexual activity: Not Currently    Partners: Male  Other Topics Concern   Not on file  Social History Narrative   Marital  status/children/pets: Single.  No children.  Lives alone.  Has pets.Orig from [place] in [place].   Education/employment: Bachelor's degree, retired   Engineer, materials:      -smoke alarm in the home:Yes     - wears seatbelt: Yes               Social Determinants of Health   Financial Resource Strain: Not on file  Food Insecurity: Not on file  Transportation Needs: Not on file  Physical Activity: Not on file  Stress: Not on file  Social Connections: Not on file  Intimate Partner Violence: Not on file   Health Maintenance  Topic Date Due   HIV Screening  Never done   Zoster Vaccines- Shingrix (1 of 2) Never done   PAP SMEAR-Modifier  Never done   COLONOSCOPY (Pts 45-56yr Insurance coverage will need to be confirmed)  09/07/2018   COVID-19 Vaccine (3 - Pfizer risk series) 02/25/2020   INFLUENZA VACCINE  07/19/2022 (Originally 05/18/2022)   MAMMOGRAM  05/07/2024   TETANUS/TDAP  03/05/2029   Hepatitis C Screening  Completed   HPV VACCINES  Aged Out       Review of Systems Pertinent items noted in HPI and remainder of comprehensive ROS otherwise negative.   Objective:  Blood pressure 119/76, pulse 69, weight 149 lb (67.6 kg), last menstrual period 05/11/2013.   GENERAL: Well-developed, well-nourished female in no acute distress.  HEENT: Normocephalic, atraumatic. Sclerae anicteric.  NECK: Supple. Normal thyroid.  LUNGS: Clear to auscultation bilaterally.  HEART: Regular rate and rhythm. BREASTS: Right breast smaller with palpable scar tissue near previous surgical sites.. No palpable masses or lymphadenopathy, skin changes, or nipple drainage.Port site intact ABDOMEN: Soft, nontender, nondistended. No organomegaly. PELVIC: Normal external female genitalia. Vagina is pink and rugated.  Normal discharge. Normal appearing cervix. Uterus is normal in size. No adnexal mass or tenderness. Chaperone present during the pelvic exam EXTREMITIES: No cyanosis, clubbing, or edema, 2+ distal  pulses.     Assessment:    Healthy female exam.      Plan:    Pap smear collected Mammogram done 04/2022 Colonoscopy referral placed See After Visit Summary for Counseling Recommendations

## 2022-06-10 NOTE — Progress Notes (Signed)
Pt states last annual was 6 years ago- never had abnormal pap

## 2022-06-15 ENCOUNTER — Other Ambulatory Visit: Payer: Self-pay

## 2022-06-15 ENCOUNTER — Inpatient Hospital Stay: Payer: Medicare Other

## 2022-06-15 ENCOUNTER — Encounter: Payer: Self-pay | Admitting: *Deleted

## 2022-06-15 VITALS — BP 129/75 | HR 68 | Temp 98.0°F | Resp 18 | Wt 150.2 lb

## 2022-06-15 DIAGNOSIS — Z923 Personal history of irradiation: Secondary | ICD-10-CM | POA: Diagnosis not present

## 2022-06-15 DIAGNOSIS — Z5112 Encounter for antineoplastic immunotherapy: Secondary | ICD-10-CM | POA: Diagnosis not present

## 2022-06-15 DIAGNOSIS — D6481 Anemia due to antineoplastic chemotherapy: Secondary | ICD-10-CM | POA: Diagnosis not present

## 2022-06-15 DIAGNOSIS — Z170 Estrogen receptor positive status [ER+]: Secondary | ICD-10-CM | POA: Diagnosis not present

## 2022-06-15 DIAGNOSIS — C50411 Malignant neoplasm of upper-outer quadrant of right female breast: Secondary | ICD-10-CM

## 2022-06-15 DIAGNOSIS — Z5111 Encounter for antineoplastic chemotherapy: Secondary | ICD-10-CM | POA: Diagnosis not present

## 2022-06-15 MED ORDER — SODIUM CHLORIDE 0.9% FLUSH
10.0000 mL | INTRAVENOUS | Status: DC | PRN
  Administered 2022-06-15: 10 mL

## 2022-06-15 MED ORDER — TRASTUZUMAB-ANNS CHEMO 150 MG IV SOLR
6.0000 mg/kg | Freq: Once | INTRAVENOUS | Status: AC
  Administered 2022-06-15: 441 mg via INTRAVENOUS
  Filled 2022-06-15: qty 21

## 2022-06-15 MED ORDER — HEPARIN SOD (PORK) LOCK FLUSH 100 UNIT/ML IV SOLN
500.0000 [IU] | Freq: Once | INTRAVENOUS | Status: AC | PRN
  Administered 2022-06-15: 500 [IU]

## 2022-06-15 MED ORDER — DIPHENHYDRAMINE HCL 25 MG PO CAPS
25.0000 mg | ORAL_CAPSULE | Freq: Once | ORAL | Status: AC
  Administered 2022-06-15: 25 mg via ORAL
  Filled 2022-06-15: qty 1

## 2022-06-15 MED ORDER — SODIUM CHLORIDE 0.9 % IV SOLN: Freq: Once | INTRAVENOUS | Status: AC

## 2022-06-15 MED ORDER — ACETAMINOPHEN 325 MG PO TABS
650.0000 mg | ORAL_TABLET | Freq: Once | ORAL | Status: AC
  Administered 2022-06-15: 650 mg via ORAL
  Filled 2022-06-15: qty 2

## 2022-06-15 NOTE — Patient Instructions (Signed)
[facility]  Discharge Instructions: Thank you for choosing [facility] to provide your oncology and hematology care.   If you have a lab appointment with the Cancer Center, please go directly to the Cancer Center and check in at the registration area.   Wear comfortable clothing and clothing appropriate for easy access to any Portacath or PICC line.   We strive to give you quality time with your provider. You may need to reschedule your appointment if you arrive late (15 or more minutes).  Arriving late affects you and other patients whose appointments are after yours.  Also, if you miss three or more appointments without notifying the office, you may be dismissed from the clinic at the provider's discretion.      For prescription refill requests, have your pharmacy contact our office and allow 72 hours for refills to be completed.    Today you received the following chemotherapy and/or immunotherapy agents: Trastuzumab      To help prevent nausea and vomiting after your treatment, we encourage you to take your nausea medication as directed.  BELOW ARE SYMPTOMS THAT SHOULD BE REPORTED IMMEDIATELY: *FEVER GREATER THAN 100.4 F (38 C) OR HIGHER *CHILLS OR SWEATING *NAUSEA AND VOMITING THAT IS NOT CONTROLLED WITH YOUR NAUSEA MEDICATION *UNUSUAL SHORTNESS OF BREATH *UNUSUAL BRUISING OR BLEEDING *URINARY PROBLEMS (pain or burning when urinating, or frequent urination) *BOWEL PROBLEMS (unusual diarrhea, constipation, pain near the anus) TENDERNESS IN MOUTH AND THROAT WITH OR WITHOUT PRESENCE OF ULCERS (sore throat, sores in mouth, or a toothache) UNUSUAL RASH, SWELLING OR PAIN  UNUSUAL VAGINAL DISCHARGE OR ITCHING   Items with * indicate a potential emergency and should be followed up as soon as possible or go to the Emergency Department if any problems should occur.  Please show the CHEMOTHERAPY ALERT CARD or IMMUNOTHERAPY ALERT CARD at check-in  to the Emergency Department and triage nurse.  Should you have questions after your visit or need to cancel or reschedule your appointment, please contact [facility]  Dept: [phone]  and follow the prompts.  Office hours are 8:00 a.m. to 4:30 p.m. Monday - Friday. Please note that voicemails left after 4:00 p.m. may not be returned until the following business day.  We are closed weekends and major holidays. You have access to a nurse at all times for urgent questions. Please call the main number to the clinic Dept: [phone] and follow the prompts.   For any non-urgent questions, you may also contact your provider using MyChart. We now offer e-Visits for anyone 18 and older to request care online for non-urgent symptoms. For details visit [email or web address].   Also download the MyChart app! Go to the app store, search "MyChart", open the app, select [facility], and log in with your MyChart username and password.  Masks are optional in the cancer centers. If you would like for your care team to wear a mask while they are taking care of you, please let them know. For doctor visits, patients may have with them one support person who is at least 59 years old. At this time, visitors are not allowed in the infusion area. 

## 2022-06-17 LAB — CYTOLOGY - PAP
Comment: NEGATIVE
Diagnosis: NEGATIVE
High risk HPV: NEGATIVE

## 2022-06-23 ENCOUNTER — Other Ambulatory Visit: Payer: Self-pay | Admitting: Hematology and Oncology

## 2022-07-01 NOTE — Progress Notes (Signed)
Patient Care Team: Luetta Nutting, DO as PCP - General (Family Medicine) Eppie Gibson, MD as Consulting Physician (Radiation Oncology) Mickeal Skinner, Acey Lav, MD as Consulting Physician (Oncology) [facility] [place] (Ophthalmology) Bridget Hartshorn, Noel Journey, MD as Referring Physician (Dermatology) Helayne Seminole, MD as Referring Physician (Otolaryngology) Mauro Kaufmann, RN as Oncology Nurse Navigator Rockwell Germany, RN as Oncology Nurse Navigator  DIAGNOSIS: No diagnosis found.  SUMMARY OF ONCOLOGIC HISTORY: Oncology History  Cancer of upper-outer quadrant of female breast ([facility])  2013 Initial Diagnosis   Right breast cancer: Stage Ia ER/PR positive HER2 negative status postlumpectomy, radiation (low risk Oncotype) could not tolerate tamoxifen for more than 30 days.   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  Biopsy revealed grade 3 IDC ER/PR negative HER2 positive with a Ki-67 of 30%   05/26/2021 Cancer Staging   Staging form: Breast, AJCC 7th Edition - Clinical stage from 05/26/2021: Stage Unknown (T1c, NX, cM0) - Signed by Eppie Gibson, MD on 05/28/2021 Specimen type: Core Needle Biopsy Stage prefix: Initial diagnosis Laterality: Right Tumor grade (Scarff-Bloom-Richardson system): G3 Estrogen receptor status: Negative Progesterone receptor status: Negative HER2 status: Positive Staging comments: Staged at Breast Cancer Conference 12/08/11    06/18/2021 Surgery   Right lumpectomy: Grade 3 IDC 1.5 cm, high-grade DCIS, margins negative, 0/2 lymph nodes negative, ER 0%, PR 0%, HER2 3+ positive, Ki-67 30%    Genetic Testing   Negative genetic testing. No pathogenic variants identified on the [facility] Multi-Cancer Panel+RNA. VUS in POLD1 identified called  c.2429C>T. The report date is 07/01/2021.  The Multi-Cancer Panel + RNA offered by [facility] includes sequencing and/or deletion duplication testing of the following 84 genes: AIP, ALK, APC, ATM, AXIN2,BAP1,  BARD1, BLM, BMPR1A, BRCA1, BRCA2, BRIP1, CASR, CDC73, CDH1, CDK4, CDKN1B, CDKN1C, CDKN2A (p14ARF), CDKN2A (p16INK4a), CEBPA, CHEK2, CTNNA1, DICER1, DIS3L2, EGFR (c.2369C>T, p.Thr790Met variant only), EPCAM (Deletion/duplication testing only), FH, FLCN, GATA2, GPC3, GREM1 (Promoter region deletion/duplication testing only), HOXB13 (c.251G>A, p.Gly84Glu), HRAS, KIT, MAX, MEN1, MET, MITF (c.952G>A, p.Glu318Lys variant only), MLH1, MSH2, MSH3, MSH6, MUTYH, NBN, NF1, NF2, NTHL1, PALB2, PDGFRA, PHOX2B, PMS2, POLD1, POLE, POT1, PRKAR1A, PTCH1, PTEN, RAD50, RAD51C, RAD51D, RB1, RECQL4, RET, RUNX1, SDHAF2, SDHA (sequence changes only), SDHB, SDHC, SDHD, SMAD4, SMARCA4, SMARCB1, SMARCE1, STK11, SUFU, TERC, TERT, TMEM127, TP53, TSC1, TSC2, VHL, WRN and WT1.   07/21/2021 -  Chemotherapy   Patient is on Treatment Plan : BREAST Paclitaxel + Trastuzumab q7d / Trastuzumab q21d     11/25/2021 - 01/04/2022 Radiation Therapy   Adj Radiation with Dr.Squire     CHIEF COMPLIANT:   INTERVAL HISTORY: Lori Mcmillan is a   ALLERGIES:  has No Known Allergies.  MEDICATIONS:  Current Outpatient Medications  Medication Sig Dispense Refill   ALPRAZolam (XANAX) 0.25 MG tablet Take 1 tablet (0.25 mg total) by mouth 2 (two) times daily as needed for anxiety. 30 tablet 1   diazepam (VALIUM) 2 MG tablet Take 2 mg by mouth at bedtime as needed for anxiety.     lidocaine-prilocaine (EMLA) cream Apply to affected area once 30 g 3   No current facility-administered medications for this visit.    PHYSICAL EXAMINATION: ECOG PERFORMANCE STATUS: {CHL ONC ECOG [phone]}  There were no vitals filed for this visit. There were no  vitals filed for this visit.  BREAST:*** No palpable masses or nodules in either right or  left breasts. No palpable axillary supraclavicular or infraclavicular adenopathy no breast tenderness or nipple discharge. (exam performed in the presence of a chaperone)  LABORATORY DATA:  I have reviewed the data as listed    Latest Ref Rng & Units 05/25/2022    9:04 AM 04/13/2022    8:55 AM 03/02/2022   10:01 AM  CMP  Glucose 70 - 99 mg/dL 97  95  88   BUN 6 - 20 mg/dL $Remove'12  15  13   'gNtfYdO$ Creatinine 0.44 - 1.00 mg/dL 0.68  0.78  0.71   Sodium 135 - 145 mmol/L 136  137  138   Potassium 3.5 - 5.1 mmol/L 4.3  3.8  4.0   Chloride 98 - 111 mmol/L 106  107  106   CO2 22 - 32 mmol/L $RemoveB'26  26  28   'oUYXJVYK$ Calcium 8.9 - 10.3 mg/dL 8.8  9.3  8.7   Total Protein 6.5 - 8.1 g/dL 7.0  6.7  6.6   Total Bilirubin 0.3 - 1.2 mg/dL 0.3  0.3  0.3   Alkaline Phos 38 - 126 U/L 72  63  69   AST 15 - 41 U/L $Remo'12  15  13   'aijjh$ ALT 0 - 44 U/L $Remo'9  17  11     'oypJj$ Lab Results  Component Value Date   WBC 6.4 05/25/2022   HGB 11.6 (L) 05/25/2022   HCT 35.5 (L) 05/25/2022   MCV 87.9 05/25/2022   PLT 277 05/25/2022   NEUTROABS 3.6 05/25/2022    ASSESSMENT & PLAN:  No problem-specific Assessment & Plan notes found for this encounter.    No orders of the defined types were placed in this encounter.  The patient has a good understanding of the overall plan. she agrees with it. she will call with any problems that may develop before the next visit here. Total time spent: 30 mins including face to face time and time spent for planning, charting and co-ordination of care   Suzzette Righter, [place] 07/01/22    I Gardiner Coins am scribing for Dr. Lindi Adie  ***

## 2022-07-06 ENCOUNTER — Encounter: Payer: Self-pay | Admitting: *Deleted

## 2022-07-06 ENCOUNTER — Inpatient Hospital Stay (HOSPITAL_BASED_OUTPATIENT_CLINIC_OR_DEPARTMENT_OTHER): Payer: Medicare Other | Admitting: Hematology and Oncology

## 2022-07-06 ENCOUNTER — Inpatient Hospital Stay: Payer: Medicare Other

## 2022-07-06 ENCOUNTER — Other Ambulatory Visit: Payer: Self-pay

## 2022-07-06 ENCOUNTER — Inpatient Hospital Stay: Payer: Medicare Other | Attending: Internal Medicine

## 2022-07-06 DIAGNOSIS — Z923 Personal history of irradiation: Secondary | ICD-10-CM | POA: Insufficient documentation

## 2022-07-06 DIAGNOSIS — Z853 Personal history of malignant neoplasm of breast: Secondary | ICD-10-CM | POA: Diagnosis not present

## 2022-07-06 DIAGNOSIS — Z95828 Presence of other vascular implants and grafts: Secondary | ICD-10-CM

## 2022-07-06 DIAGNOSIS — C50411 Malignant neoplasm of upper-outer quadrant of right female breast: Secondary | ICD-10-CM | POA: Diagnosis not present

## 2022-07-06 DIAGNOSIS — Z5112 Encounter for antineoplastic immunotherapy: Secondary | ICD-10-CM | POA: Diagnosis not present

## 2022-07-06 DIAGNOSIS — Z171 Estrogen receptor negative status [ER-]: Secondary | ICD-10-CM | POA: Insufficient documentation

## 2022-07-06 DIAGNOSIS — Z9221 Personal history of antineoplastic chemotherapy: Secondary | ICD-10-CM | POA: Insufficient documentation

## 2022-07-06 LAB — CMP (CANCER CENTER ONLY)
ALT: 10 U/L (ref 0–44)
AST: 13 U/L — ABNORMAL LOW (ref 15–41)
Albumin: 4.3 g/dL (ref 3.5–5.0)
Alkaline Phosphatase: 69 U/L (ref 38–126)
Anion gap: 5 (ref 5–15)
BUN: 8 mg/dL (ref 6–20)
CO2: 26 mmol/L (ref 22–32)
Calcium: 8.8 mg/dL — ABNORMAL LOW (ref 8.9–10.3)
Chloride: 107 mmol/L (ref 98–111)
Creatinine: 0.71 mg/dL (ref 0.44–1.00)
GFR, Estimated: >60 mL/min (ref >60)
Glucose, Bld: 94 mg/dL (ref 70–99)
Potassium: 4.3 mmol/L (ref 3.5–5.1)
Sodium: 138 mmol/L (ref 135–145)
Total Bilirubin: 0.3 mg/dL (ref 0.3–1.2)
Total Protein: 6.8 g/dL (ref 6.5–8.1)

## 2022-07-06 LAB — CBC WITH DIFFERENTIAL (CANCER CENTER ONLY)
Abs Immature Granulocytes: 0.01 10*3/uL (ref 0.00–0.07)
Basophils Absolute: 0 10*3/uL (ref 0.0–0.1)
Basophils Relative: 1 %
Eosinophils Absolute: 0.1 10*3/uL (ref 0.0–0.5)
Eosinophils Relative: 2 %
HCT: 36 % (ref 36.0–46.0)
Hemoglobin: 11.8 g/dL — ABNORMAL LOW (ref 12.0–15.0)
Immature Granulocytes: 0 %
Lymphocytes Relative: 25 %
Lymphs Abs: 1.6 10*3/uL (ref 0.7–4.0)
MCH: 29.4 pg (ref 26.0–34.0)
MCHC: 32.8 g/dL (ref 30.0–36.0)
MCV: 89.6 fL (ref 80.0–100.0)
Monocytes Absolute: 0.7 10*3/uL (ref 0.1–1.0)
Monocytes Relative: 10 %
Neutro Abs: 3.9 10*3/uL (ref 1.7–7.7)
Neutrophils Relative %: 62 %
Platelet Count: 282 10*3/uL (ref 150–400)
RBC: 4.02 MIL/uL (ref 3.87–5.11)
RDW: 13.2 % (ref 11.5–15.5)
WBC Count: 6.4 10*3/uL (ref 4.0–10.5)
nRBC: 0 % (ref 0.0–0.2)

## 2022-07-06 MED ORDER — ACETAMINOPHEN 325 MG PO TABS
650.0000 mg | ORAL_TABLET | Freq: Once | ORAL | Status: AC
  Administered 2022-07-06: 650 mg via ORAL
  Filled 2022-07-06: qty 2

## 2022-07-06 MED ORDER — HEPARIN SOD (PORK) LOCK FLUSH 100 UNIT/ML IV SOLN
500.0000 [IU] | Freq: Once | INTRAVENOUS | Status: AC | PRN
  Administered 2022-07-06: 500 [IU]

## 2022-07-06 MED ORDER — SODIUM CHLORIDE 0.9 % IV SOLN: Freq: Once | INTRAVENOUS | Status: AC

## 2022-07-06 MED ORDER — SODIUM CHLORIDE 0.9% FLUSH
10.0000 mL | Freq: Once | INTRAVENOUS | Status: AC
  Administered 2022-07-06: 10 mL

## 2022-07-06 MED ORDER — SODIUM CHLORIDE 0.9% FLUSH
10.0000 mL | INTRAVENOUS | Status: DC | PRN
  Administered 2022-07-06: 10 mL

## 2022-07-06 MED ORDER — DIPHENHYDRAMINE HCL 25 MG PO CAPS
25.0000 mg | ORAL_CAPSULE | Freq: Once | ORAL | Status: AC
  Administered 2022-07-06: 25 mg via ORAL
  Filled 2022-07-06: qty 1

## 2022-07-06 MED ORDER — TRASTUZUMAB-ANNS CHEMO 150 MG IV SOLR
6.0000 mg/kg | Freq: Once | INTRAVENOUS | Status: AC
  Administered 2022-07-06: 441 mg via INTRAVENOUS
  Filled 2022-07-06: qty 21

## 2022-07-06 NOTE — Assessment & Plan Note (Signed)
06/18/2021:Right lumpectomy: Grade 3 IDC 1.5 cm, high-grade DCIS, margins negative, 0/2 lymph nodes negative, ER 0%, PR 0%, HER2 3+ positive, Ki-67 30%  (2013Right breast cancer: Stage Ia ER/PR positive HER2 negative status postlumpectomy, radiation (low risk Oncotype) could not tolerate tamoxifen for more than 30 days.)  Treatment plan: 1.Adjuvant chemotherapy with Taxol and Herceptin followed by Herceptin maintenance 2.breast radiation completed 01/04/2022 ----------------------------------------------------------------------------------------------------------------------------------- Current treatment:Completed 12 cycles ofTaxol Herceptin, today's the last treatment with Herceptin  Chemo toxicities: 1. Fatigue:Much improved 2.Chemotherapy-induced anemia: Today's hemoglobin is 11.6 Echo 04/15/2022: EF 55%  Return to clinic in 3 months for survivorship care plan visit

## 2022-07-06 NOTE — Patient Instructions (Signed)
[facility]  Discharge Instructions: Thank you for choosing [facility] to provide your oncology and hematology care.   If you have a lab appointment with the [facility], please go directly to the [facility] and check in at the registration area.   Wear comfortable clothing and clothing appropriate for easy access to any Portacath or PICC line.   We strive to give you quality time with your provider. You may need to reschedule your appointment if you arrive late (15 or more minutes).  Arriving late affects you and other patients whose appointments are after yours.  Also, if you miss three or more appointments without notifying the office, you may be dismissed from the clinic at the provider's discretion.      For prescription refill requests, have your pharmacy contact our office and allow 72 hours for refills to be completed.    Today you received the following chemotherapy and/or immunotherapy agents: trastuzumab-anns (kanjinti)      To help prevent nausea and vomiting after your treatment, we encourage you to take your nausea medication as directed.  BELOW ARE SYMPTOMS THAT SHOULD BE REPORTED IMMEDIATELY: *FEVER GREATER THAN 100.4 F (38 C) OR HIGHER *CHILLS OR SWEATING *NAUSEA AND VOMITING THAT IS NOT CONTROLLED WITH YOUR NAUSEA MEDICATION *UNUSUAL SHORTNESS OF BREATH *UNUSUAL BRUISING OR BLEEDING *URINARY PROBLEMS (pain or burning when urinating, or frequent urination) *BOWEL PROBLEMS (unusual diarrhea, constipation, pain near the anus) TENDERNESS IN MOUTH AND THROAT WITH OR WITHOUT PRESENCE OF ULCERS (sore throat, sores in mouth, or a toothache) UNUSUAL RASH, SWELLING OR PAIN  UNUSUAL VAGINAL DISCHARGE OR ITCHING   Items with * indicate a potential emergency and should be followed up as soon as possible or go to the Emergency Department if any problems should occur.  Please show the CHEMOTHERAPY ALERT CARD or IMMUNOTHERAPY ALERT  CARD at check-in to the Emergency Department and triage nurse.  Should you have questions after your visit or need to cancel or reschedule your appointment, please contact [facility]  Dept: [phone]  and follow the prompts.  Office hours are 8:00 a.m. to 4:30 p.m. Monday - Friday. Please note that voicemails left after 4:00 p.m. may not be returned until the following business day.  We are closed weekends and major holidays. You have access to a nurse at all times for urgent questions. Please call the main number to the clinic Dept: [phone] and follow the prompts.   For any non-urgent questions, you may also contact your provider using MyChart. We now offer e-Visits for anyone 26 and older to request care online for non-urgent symptoms. For details visit [email or web address].   Also download the MyChart app! Go to the app store, search "MyChart", open the app, select [facility], and log in with your MyChart username and password.  Masks are optional in the cancer centers. If you would like for your care team to wear a mask while they are taking care of you, please let them know. You may have one support person who is at least 59 years old accompany you for your appointments.

## 2022-07-07 ENCOUNTER — Telehealth: Payer: Self-pay | Admitting: Adult Health

## 2022-07-07 NOTE — Telephone Encounter (Signed)
Left patient message about scheduled 12/19 appointment

## 2022-07-27 DIAGNOSIS — Z452 Encounter for adjustment and management of vascular access device: Secondary | ICD-10-CM | POA: Diagnosis not present

## 2022-08-24 ENCOUNTER — Encounter: Payer: Self-pay | Admitting: Gastroenterology

## 2022-09-02 ENCOUNTER — Other Ambulatory Visit: Payer: Self-pay | Admitting: Hematology and Oncology

## 2022-09-07 ENCOUNTER — Ambulatory Visit (AMBULATORY_SURGERY_CENTER): Payer: Self-pay | Admitting: *Deleted

## 2022-09-07 VITALS — Ht 63.0 in | Wt 147.0 lb

## 2022-09-07 DIAGNOSIS — Z1211 Encounter for screening for malignant neoplasm of colon: Secondary | ICD-10-CM

## 2022-09-07 MED ORDER — NA SULFATE-K SULFATE-MG SULF 17.5-3.13-1.6 GM/177ML PO SOLN: 1.0000 | Freq: Once | ORAL | 0 refills | Status: AC

## 2022-09-07 NOTE — Progress Notes (Signed)

## 2022-09-15 DIAGNOSIS — L570 Actinic keratosis: Secondary | ICD-10-CM | POA: Diagnosis not present

## 2022-09-15 DIAGNOSIS — D225 Melanocytic nevi of trunk: Secondary | ICD-10-CM | POA: Diagnosis not present

## 2022-09-15 DIAGNOSIS — C44619 Basal cell carcinoma of skin of left upper limb, including shoulder: Secondary | ICD-10-CM | POA: Diagnosis not present

## 2022-09-17 ENCOUNTER — Encounter: Payer: Medicare Other | Admitting: Gastroenterology

## 2022-09-27 ENCOUNTER — Encounter: Payer: Self-pay | Admitting: Gastroenterology

## 2022-09-29 ENCOUNTER — Encounter: Payer: Self-pay | Admitting: Gastroenterology

## 2022-09-29 ENCOUNTER — Ambulatory Visit (AMBULATORY_SURGERY_CENTER): Payer: Medicare Other | Admitting: Gastroenterology

## 2022-09-29 VITALS — BP 143/72 | HR 65 | Temp 96.8°F | Resp 12 | Ht 63.0 in | Wt 147.0 lb

## 2022-09-29 DIAGNOSIS — Z1211 Encounter for screening for malignant neoplasm of colon: Secondary | ICD-10-CM | POA: Diagnosis not present

## 2022-09-29 DIAGNOSIS — D123 Benign neoplasm of transverse colon: Secondary | ICD-10-CM

## 2022-09-29 MED ORDER — SODIUM CHLORIDE 0.9 % IV SOLN: 500.0000 mL | Freq: Once | INTRAVENOUS | Status: DC

## 2022-09-29 NOTE — Patient Instructions (Signed)
Handout on polyps given. Resume previous diet. Continue present medications.     YOU HAD AN ENDOSCOPIC PROCEDURE TODAY AT [facility]:   Refer to the procedure report that was given to you for any specific questions about what was found during the examination.  If the procedure report does not answer your questions, please call your gastroenterologist to clarify.  If you requested that your care partner not be given the details of your procedure findings, then the procedure report has been included in a sealed envelope for you to review at your convenience later.  YOU SHOULD EXPECT: Some feelings of bloating in the abdomen. Passage of more gas than usual.  Walking can help get rid of the air that was put into your GI tract during the procedure and reduce the bloating. If you had a lower endoscopy (such as a colonoscopy or flexible sigmoidoscopy) you may notice spotting of blood in your stool or on the toilet paper. If you underwent a bowel prep for your procedure, you may not have a normal bowel movement for a few days.  Please Note:  You might notice some irritation and congestion in your nose or some drainage.  This is from the oxygen used during your procedure.  There is no need for concern and it should clear up in a day or so.  SYMPTOMS TO REPORT IMMEDIATELY:  Following lower endoscopy (colonoscopy or flexible sigmoidoscopy):  Excessive amounts of blood in the stool  Significant tenderness or worsening of abdominal pains  Swelling of the abdomen that is new, acute  Fever of 100F or higher   For urgent or emergent issues, a gastroenterologist can be reached at any hour by calling [phone]. Do not use MyChart messaging for urgent concerns.    DIET:  We do recommend a small meal at first, but then you may proceed to your regular diet.  Drink plenty of fluids but you should avoid alcoholic beverages for 24 hours.  ACTIVITY:  You should plan to take it easy for the  rest of today and you should NOT DRIVE or use heavy machinery until tomorrow (because of the sedation medicines used during the test).    FOLLOW UP: Our staff will call the number listed on your records the next business day following your procedure.  We will call around 7:15- 8:00 am to check on you and address any questions or concerns that you may have regarding the information given to you following your procedure. If we do not reach you, we will leave a message.     If any biopsies were taken you will be contacted by phone or by letter within the next 1-3 weeks.  Please call us at [phone] if you have not heard about the biopsies in 3 weeks.    SIGNATURES/CONFIDENTIALITY: You and/or your care partner have signed paperwork which will be entered into your electronic medical record.  These signatures attest to the fact that that the information above on your After Visit Summary has been reviewed and is understood.  Full responsibility of the confidentiality of this discharge information lies with you and/or your care-partner.

## 2022-09-29 NOTE — Progress Notes (Signed)
Report given to PACU, vss 

## 2022-09-29 NOTE — Progress Notes (Signed)
[facility] History and Physical   Primary Care Physician:  Luetta Nutting, DO   Reason for Procedure:   Colorectal cancer screening  Plan:    colon     HPI: Lori Mcmillan is a 61 y.o. 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  No sign of residual dz at 04/2018 rad onc f/u and 10/2018 neuro-onc f/u. 03/2019 MRI brain->no resid/no recurrence.   Brain embolism and thrombosis    Breast cancer ([place])    Rt breast; 1.5 cm low grade invasive ductal carcinoma status post lumpectomy with sentinel node biopsy on 01/04/2012.   Chicken pox    Depression    zoloft in past--?wt gain.   Diplopia    chronic (meningioma-related)   Eustachian tube dysfunction 09/05/2013   Family history of breast cancer    Family history of colon cancer    Family history of melanoma    Family history of ovarian cancer    Family history of pancreatic cancer    GERD (gastroesophageal reflux disease)    History of radiation therapy 02/24/12-04/11/12   right breast/ 45Gy'@1'$ .8Gyx34f/boost=16Gy'@2'$  [id number]  Latest mammo and u/s 03/2013--normal.   History of radiation therapy 11/30/17- 01/11/18   Right temporal lobe treated to 55.8 Gy with 31 fx of 1.8 Gy   Hx of basal cell carcinoma    Migraine    Palpitations    has taken Metoprolol for palpitations in the past.   Seizure (HCTy Ty   2 YEARS AGO,UPDATED 09/07/22   Taste impairment    s/p radiation therapy   Tobacco dependence 09/05/2013    Past Surgical History:  Procedure Laterality Date   APPENDECTOMY  2008   emergency   BRAIN TUMOR EXCISION  2018   Meningioma   BREAST LUMPECTOMY Right 01/04/2012   right breast   BREAST LUMPECTOMY WITH RADIOACTIVE SEED AND SENTINEL LYMPH NODE BIOPSY Right 06/18/2021   Procedure: RIGHT BREAST LUMPECTOMY WITH RADIOACTIVE SEED AND SENTINEL LYMPH NODE BIOPSY;  Surgeon:  CoErroll LunaMD;  [facility];  Laterality: Right;   COLONOSCOPY     2014 ?   [facility]OMY  10/19/2017   [facility]   [place]geon: CoErroll LunaMD;  [facility] Service: General;  Laterality: Right;   PORTACATH REMOVED     [place],2023   [facility]  Prior to Admission medications   Medication Sig Start Date End Date Taking? Authorizing Provider  diazepam (VALIUM) 2 MG tablet Take by mouth as needed. 09/04/22  Yes [provider]  Famotidine-Ca Carb-Mag Hydrox (PEPCID COMPLETE PO) Take by mouth as needed.   Yes [provider]  acetaminophen (TYLENOL) 500 MG tablet Take 500 mg by mouth as needed.    [provider]  ALPRAZolam (XANAX) 0.25 MG tablet TAKE 1 TABLET BY MOUTH 2 TIMES DAILY AS NEEDED FOR ANXIETY. 09/02/22   GuNicholas LoseMD    Current Outpatient Medications  Medication Sig Dispense Refill   diazepam (VALIUM) 2 MG tablet Take by mouth as needed.     Famotidine-Ca Carb-Mag Hydrox (PEPCID COMPLETE PO) Take by mouth as needed.     acetaminophen (TYLENOL) 500 MG tablet Take 500 mg by mouth  as needed.     ALPRAZolam (XANAX) 0.25 MG tablet TAKE 1 TABLET BY MOUTH 2 TIMES DAILY AS NEEDED FOR ANXIETY. 30 tablet 3   Current Facility-Administered Medications  Medication Dose Route Frequency Provider Last Rate Last Admin   0.9 %  sodium chloride infusion  500 mL Intravenous Once Jackquline Denmark, MD        Allergies as of 09/29/2022   (No Known Allergies)    Family History  Problem Relation Age of Onset   Anesthesia problems Mother    Breast cancer Mother 33       lumpectomy, chemo, radiation   Colon polyps Brother    Ovarian cancer Maternal Aunt 70   Cancer Maternal Aunt        unknown type   Breast cancer Maternal Aunt 80   Breast cancer Maternal Aunt 40       bilateral    Pancreatic cancer Maternal Uncle        dx 53s   Stomach cancer Paternal Uncle    Cancer Paternal Uncle        unknown type   Lung cancer Paternal Uncle        hx smoking   Dementia Maternal Grandmother    Lung cancer Maternal Grandfather        hx smoking   Pancreatic cancer Paternal Grandmother 82   Melanoma Paternal Grandmother        dx >50, shin   Heart Problems Paternal Grandfather    Colon cancer Cousin 21       maternal first cousin   Breast cancer Cousin        dx 61s, paternal first cousin   Crohn's disease Neg Hx    Esophageal cancer Neg Hx    Rectal cancer Neg Hx    Ulcerative colitis Neg Hx     Social History   Socioeconomic History   Marital status: Single    Spouse name: Not on file   Number of children: Not on file   Years of education: Not on file   Highest education level: Not on file  Occupational History   Not on file  Tobacco Use   Smoking status: Former    Packs/day: 0.50    Types: Cigarettes    Quit date: 10/17/2017    Years since quitting: 4.9    Passive exposure: Never   Smokeless tobacco: Never  Vaping Use   Vaping Use: Former  Substance and Sexual Activity   Alcohol use: Yes    Alcohol/week: 3.0 standard drinks of alcohol    Types: 3 Cans of beer per week    Comment: 3-4 beers/day   Drug use: Not Currently   Sexual activity: Not Currently    Partners: Male  Other Topics Concern   Not on file  Social History Narrative   Marital status/children/pets: Single.  No children.  Lives alone.  Has pets.Orig from [place] in [place].   Education/employment: Bachelor's degree, retired   Engineer, materials:      -smoke alarm in the home:Yes     - wears seatbelt: Yes               Social Determinants of Health   Financial Resource Strain: Not on file  Food Insecurity: Not on file  Transportation Needs: Not on file  Physical Activity: Not on file  Stress: Not on file  Social Connections: Not on file  Intimate Partner Violence: Not on file     Review of Systems: Positive  for none All other review of systems negative except as mentioned in the HPI.  Physical Exam: Vital signs in last 24 hours: '@VSRANGES'$ @   General:   Alert,  Well-developed, well-nourished, pleasant and cooperative in NAD Lungs:  Clear throughout to auscultation.   Heart:  Regular rate and rhythm; no murmurs, clicks, rubs,  or gallops. Abdomen:  Soft, nontender and nondistended. Normal bowel sounds.   Neuro/Psych:  Alert and cooperative. Normal mood and affect. A and O x 3    No significant changes were identified.  The patient continues to be an appropriate candidate for the planned procedure and anesthesia.   Carmell Austria, MD. [facility] 09/29/2022 2:38 PM@

## 2022-09-29 NOTE — Progress Notes (Signed)
Called to room to assist during endoscopic procedure.  Patient ID and intended procedure confirmed with present staff. Received instructions for my participation in the procedure from the performing physician.  

## 2022-09-29 NOTE — Op Note (Signed)
[facility] Patient Name: Lori Mcmillan Procedure Date: 09/29/2022 1:21 PM MRN: [id number] Endoscopist: Jackquline Denmark , MD, [id number] Age: 61 Referring MD:  Date of Birth: [birth year] Gender: Female Account #: [id number] Procedure:                Colonoscopy Indications:              Screening for colorectal malignant neoplasm Medicines:                Monitored Anesthesia Care Procedure:                Pre-Anesthesia Assessment:                           - Prior to the procedure, a History and Physical                            was performed, and patient medications and                            allergies were reviewed. The patient's tolerance of                            previous anesthesia was also reviewed. The risks                            and benefits of the procedure and the sedation                            options and risks were discussed with the patient.                            All questions were answered, and informed consent                            was obtained. Prior Anticoagulants: The patient has                            taken no anticoagulant or antiplatelet agents. ASA                            Grade Assessment: II - A patient with mild systemic                            disease. After reviewing the risks and benefits,                            the patient was deemed in satisfactory condition to                            undergo the procedure.                           After obtaining informed consent, the colonoscope  was passed under direct vision. Throughout the                            procedure, the patient's blood pressure, pulse, and                            oxygen saturations were monitored continuously. The                            PCF-HQ190L Colonoscope was introduced through the                            anus and advanced to the 2 cm into the ileum. The                            colonoscopy  was performed without difficulty. The                            patient tolerated the procedure well. The quality                            of the bowel preparation was good. The terminal                            ileum, ileocecal valve, appendiceal orifice, and                            rectum were photographed. Scope In: 2:54:32 PM Scope Out: 3:09:04 PM Scope Withdrawal Time: 0 hours 10 minutes 26 seconds  Total Procedure Duration: 0 hours 14 minutes 32 seconds  Findings:                 A 6 mm polyp was found in the mid transverse colon.                            The polyp was sessile. The polyp was removed with a                            cold snare. Resection and retrieval were complete.                           A few rare small-mouthed diverticula were found in                            the sigmoid colon.                           Non-bleeding internal hemorrhoids were found during                            retroflexion. The hemorrhoids were small and Grade                            I (internal hemorrhoids that do  not prolapse).                           The terminal ileum appeared normal.                           The exam was otherwise without abnormality on                            direct and retroflexion views. Complications:            No immediate complications. Estimated Blood Loss:     Estimated blood loss: none. Impression:               - One 6 mm polyp in the mid transverse colon,                            removed with a cold snare. Resected and retrieved.                           - Minimal sigmoid diverticulosis.                           - Non-bleeding internal hemorrhoids.                           - The examined portion of the ileum was normal.                           - The examination was otherwise normal on direct                            and retroflexion views. Recommendation:           - Patient has a contact number available for                             emergencies. The signs and symptoms of potential                            delayed complications were discussed with the                            patient. Return to normal activities tomorrow.                            Written discharge instructions were provided to the                            patient.                           - Resume previous diet.                           - Continue present medications.                           -  Await pathology results.                           - Repeat colonoscopy for surveillance based on                            pathology results.                           - The findings and recommendations were discussed                            with the patient's family. Jackquline Denmark, MD 09/29/2022 3:12:11 PM This report has been signed electronically.

## 2022-09-29 NOTE — Progress Notes (Signed)
Pt's states no medical or surgical changes since previsit or office visit. 

## 2022-09-30 ENCOUNTER — Telehealth: Payer: Self-pay

## 2022-09-30 NOTE — Telephone Encounter (Signed)
  Follow up Call-     09/29/2022    1:33 PM  Call back number  Post procedure Call Back phone  # [phone]  Permission to leave phone message Yes   Follow-up call, LVM.

## 2022-10-05 ENCOUNTER — Encounter: Payer: Medicare Other | Admitting: Adult Health

## 2022-10-05 ENCOUNTER — Encounter: Payer: Self-pay | Admitting: *Deleted

## 2022-10-05 ENCOUNTER — Other Ambulatory Visit: Payer: Self-pay | Admitting: Pharmacist

## 2022-10-05 NOTE — Progress Notes (Incomplete)
SURVIVORSHIP VISIT:    BRIEF ONCOLOGIC HISTORY:  Oncology History  Malignant neoplasm of central portion of right breast in female, estrogen receptor negative ([place])  2013 Initial Diagnosis   Right breast cancer: Stage Ia ER/PR positive HER2 negative status postlumpectomy, radiation (low risk Oncotype) could not tolerate tamoxifen for more than 30 days.   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 No pathogenic variants identified on the [facility] Multi-Cancer Panel+RNA. VUS in POLD1 identified called c.2429C>T. The report date is 07/01/2021.  The Multi-Cancer Panel + RNA offered by [facility] includes sequencing and/or deletion duplication testing of the following 84 genes: AIP, ALK, APC, ATM, AXIN2,BAP1,  BARD1, BLM, BMPR1A, BRCA1, BRCA2, BRIP1, CASR, CDC73, CDH1, CDK4, CDKN1B, CDKN1C, CDKN2A (p14ARF), CDKN2A (p16INK4a), CEBPA, CHEK2, CTNNA1, DICER1, DIS3L2, EGFR (c.2369C>T, p.Thr790Met variant only), EPCAM (Deletion/duplication testing only), FH, FLCN, GATA2, GPC3, GREM1 (Promoter region deletion/duplication testing only), HOXB13 (c.251G>A, p.Gly84Glu), HRAS, KIT, MAX, MEN1, MET, MITF (c.952G>A, p.Glu318Lys variant only), MLH1, MSH2, MSH3, MSH6, MUTYH, NBN, NF1, NF2, NTHL1, PALB2, PDGFRA, PHOX2B, PMS2, POLD1, POLE, POT1, PRKAR1A, PTCH1, PTEN, RAD50, RAD51C, RAD51D, RB1, RECQL4, RET, RUNX1, SDHAF2, SDHA (sequence changes only), SDHB, SDHC, SDHD, SMAD4, SMARCA4, SMARCB1, SMARCE1, STK11, SUFU, TERC,  TERT, TMEM127, TP53, TSC1, TSC2, VHL, WRN and WT1.   06/18/2021 Cancer Staging   Staging form: Breast, AJCC 8th Edition - Pathologic stage from 06/18/2021: Stage IA (pT1c, pN0, cM0, G3, ER-, PR-, HER2+) - Signed by Gardenia Phlegm, NP on 10/05/2022 Stage prefix: Initial diagnosis Histologic grading system: 3 grade system   07/21/2021 -  Chemotherapy   Patient is on Treatment Plan : BREAST Paclitaxel + Trastuzumab q7d / Trastuzumab q21d     11/25/2021 - 01/04/2022 Radiation Therapy   Site Technique Total Dose (Gy) Dose per Fx (Gy) Completed Fx Beam Energies  Breast, Right: Breast_R 3D 45/45 1.8 25/25 6XFFF  Breast, Right: Breast_R_Bst specialPort 5.4/5.4 1.8 3/3 12E       INTERVAL HISTORY:  Ms. Paletta to review her survivorship care plan detailing her treatment course for breast cancer, as well as monitoring long-term side effects of that treatment, education regarding health maintenance, screening, and overall wellness and health promotion.     Overall, Ms. Silverstein reports feeling quite well   REVIEW OF SYSTEMS:  Review of Systems  Constitutional:  Negative for appetite change, chills, fatigue, fever and unexpected weight change.  HENT:   Negative for hearing loss, lump/mass and trouble swallowing.   Eyes:  Negative for eye problems and icterus.  Respiratory:  Negative for chest tightness, cough and shortness of breath.   Cardiovascular:  Negative for chest pain, leg swelling and palpitations.  Gastrointestinal:  Negative for abdominal distention, abdominal pain, constipation, diarrhea, nausea and vomiting.  Endocrine: Negative for hot flashes.  Genitourinary:  Negative for difficulty urinating.   Musculoskeletal:  Negative for arthralgias.  Skin:  Negative for itching and rash.  Neurological:  Negative for dizziness, extremity weakness, headaches and numbness.  Hematological:  Negative for adenopathy. Does not bruise/bleed easily.  Psychiatric/Behavioral:  Negative for  depression. The patient is not nervous/anxious.    Breast: Denies any new nodularity, masses, tenderness, nipple changes, or nipple discharge.        PAST MEDICAL/SURGICAL HISTORY:  Past Medical History:  Diagnosis Date  . Allergy    SEASONAL  . Anxiety    occ lorazepam  . 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  No sign of residual dz at 04/2018 rad onc f/u and 10/2018 neuro-onc f/u. 03/2019 MRI brain->no resid/no recurrence.  . Brain embolism and thrombosis   . Breast cancer ([place])    Rt breast; 1.5 cm low grade invasive ductal carcinoma status post lumpectomy with sentinel node biopsy on 01/04/2012.  Marland Kitchen Chicken pox   . Depression    zoloft in past--?wt gain.  . Diplopia    chronic (meningioma-related)  . Eustachian tube dysfunction 09/05/2013  . Family history of breast cancer   . Family history of colon cancer   . Family history of melanoma   . Family history of ovarian cancer   . Family history of pancreatic cancer   . GERD (gastroesophageal reflux disease)   . History of radiation therapy 02/24/12-04/11/12   right breast/ 45Gy_0 .8Gyx76f/boost=16Gy_1  [id number]  Latest mammo and u/s 03/2013--normal.  . History of radiation therapy 11/30/17- 01/11/18   Right temporal lobe treated to 55.8 Gy with 31 fx of 1.8 Gy  . Hx of basal cell carcinoma   . Migraine   . Palpitations    has taken Metoprolol for palpitations in the past.  . Se[place] ([facility]   2 YEARS AGO,UPDATED 09/07/22  . Taste impairment    s/p radiation therapy  . Tobacco dependence 09/05/2013   Past Surgical History:  Procedure Laterality Date  . APPENDECTOMY  2008   emergency  . BRAIN TUMOR EXCISION  2018   Meningioma  . BREAST LUMPECTOMY Right 01/04/2012   right breast  . BREAST LUMPECTOMY WITH RADIOACTIVE SEED AND SENTINEL LYMPH NODE BIOPSY Right 06/18/2021   Procedure: RIGHT BREAST LUMPECTOMY WITH RADIOACTIVE SEED AND SENTINEL LYMPH NODE BIOPSY;  Surgeon: CoErroll Luna MD;  [facility] Service: General;  Laterality: Right;  . COLONOSCOPY     2014 ?  . Marland KitchenRANIECTOMY  10/19/2017   [facility]   Procedure: INSERTION PORT-A-CATH;  Surgeon: CoErroll LunaMD;  [facility] Service: General;  Laterality: Right;  . PORTACATH REMOVED     OCTOBER 10,2023  . WISDOM TOOTH EXTRACTION  1990     ALLERGIES:  No Known Allergies   CURRENT MEDICATIONS:  Outpatient Encounter Medications as of 10/05/2022  Medication Sig  . acetaminophen (TYLENOL) 500 MG tablet Take 500 mg by mouth as needed.  . ALPRAZolam (XANAX) 0.25 MG tablet TAKE 1 TABLET BY MOUTH 2 TIMES DAILY AS NEEDED FOR ANXIETY.  . diazepam (VALIUM) 2 MG tablet Take by mouth as needed.  . Famotidine-Ca Carb-Mag Hydrox (PEPCID COMPLETE PO) Take by mouth as needed.   No facility-administered encounter medications on file as of 10/05/2022.     ONCOLOGIC FAMILY HISTORY:  Family History  Problem Relation Age of Onset  . Anesthesia problems Mother   . Breast cancer Mother [id number]     lumpectomy, chemo, radiation  . Colon polyps Brother   . Ovarian cancer Maternal Aunt 70  . Cancer Maternal Aunt  unknown type  . Breast cancer Maternal Aunt 80  . Breast cancer Maternal Aunt 40       bilateral  . Pancreatic cancer Maternal Uncle        dx 9s  . Stomach cancer Paternal Uncle   . Cancer Paternal Uncle        unknown type  . Lung cancer Paternal Uncle        hx smoking  . Dementia Maternal Grandmother   . Lung cancer Maternal Grandfather        hx smoking  . Pancreatic cancer Paternal Grandmother 55  . Melanoma Paternal Grandmother        dx >50, shin  . Heart Problems Paternal Grandfather   . Colon cancer Cousin 66       maternal first cousin  . Breast cancer Cousin        dx 66s, paternal first cousin  . Crohn's disease Neg Hx   . Esophageal cancer Neg Hx   . Rectal cancer Neg Hx   .  Ulcerative colitis Neg Hx       SOCIAL HISTORY:  Social History   Socioeconomic History  . Marital status: Single    Spouse name: Not on file  . Number of children: Not on file  . Years of education: Not on file  . Highest education level: Not on file  Occupational History  . Not on file  Tobacco Use  . Smoking status: Former    Packs/day: 0.50    Types: Cigarettes    Quit date: 10/17/2017    Years since quitting: 4.9    Passive exposure: Never  . Smokeless tobacco: Never  Vaping Use  . Vaping Use: Former  Substance and Sexual Activity  . Alcohol use: Yes    Alcohol/week: 3.0 standard drinks of alcohol    Types: 3 Cans of beer per week    Comment: 3-4 beers/day  . Drug use: Not Currently  . Sexual activity: Not Currently    Partners: Male  Other Topics Concern  . Not on file  Social History Narrative   Marital status/children/pets: Single.  No children.  Lives alone.  Has pets.Orig from [place] in [place].   Education/employment: Bachelor's degree, retired   Engineer, materials:      -smoke alarm in the home:Yes     - wears seatbelt: Yes               Social Determinants of Health   Financial Resource Strain: Not on file  Food Insecurity: Not on file  Transportation Needs: Not on file  Physical Activity: Not on file  Stress: Not on file  Social Connections: Not on file  Intimate Partner Violence: Not on file     OBSERVATIONS/OBJECTIVE:  LMP 05/11/2013  GENERAL: Patient is a well appearing female in no acute distress HEENT:  Sclerae anicteric.  Oropharynx clear and moist. No ulcerations or evidence of oropharyngeal candidiasis. Neck is supple.  NODES:  No cervical, supraclavicular, or axillary lymphadenopathy palpated.  BREAST EXAM: right bresat s/p lumpectomy and radiation no sign of breast cancer recurrence, left breast benign LUNGS:  Clear to auscultation bilaterally.  No wheezes or rhonchi. HEART:  Regular rate and rhythm. No murmur appreciated. ABDOMEN:  Soft,  nontender.  Positive, normoactive bowel sounds. No organomegaly palpated. MSK:  No focal spinal tenderness to palpation. Full range of motion bilaterally in the upper extremities. EXTREMITIES:  No peripheral edema.   SKIN:  Clear with no obvious rashes or skin  changes. No nail dyscrasia. NEURO:  Nonfocal. Well oriented.  Appropriate affect.   LABORATORY DATA:  None for this visit.  DIAGNOSTIC IMAGING:  None for this visit.      ASSESSMENT AND PLAN:  Ms.. Pals is a pleasant 61 y.o. female with Stage IA right breast invasive ductal carcinoma, ER-/PR-/HER2+, diagnosed in 04/2021, treated with lumpectomy, adjuvant chemotherapy, maintenance trastuzumab, and adjuvant radiation therapy.  She presents to the Survivorship Clinic for our initial meeting and routine follow-up post-completion of treatment for breast cancer.    1. Stage IA right breast cancer:  Ms. Folds is continuing to recover from definitive treatment for breast cancer. She will follow-up with her medical oncologist, Dr. Ross Ludwig in *** with history and physical exam per surveillance protocol.  She will continue her anti-estrogen therapy with ***. Thus far, she is tolerating the *** well, with minimal side effects. She was instructed to make Dr. Lindi Adie or myself aware if she begins to experience any worsening side effects of the medication and I could see her back in clinic to help manage those side effects, as needed. Her mammogram is due ***; orders placed today.  Her breast density is category ***. Today, a comprehensive survivorship care plan and treatment summary was reviewed with the patient today detailing her breast cancer diagnosis, treatment course, potential late/long-term effects of treatment, appropriate follow-up care with recommendations for the future, and patient education resources.  A copy of this summary, along with a letter will be sent to the patient's primary care provider via mail/fax/In Basket  message after today's visit.    #. Problem(s) at Visit______________  #. Bone health:  Given Ms. Yanni's age/history of breast cancer and her current treatment regimen including anti-estrogen therapy with ***, she is at risk for bone demineralization.  Her last DEXA scan was ***, which showed ***.  In the meantime, she was encouraged to increase her consumption of foods rich in calcium, as well as increase her weight-bearing activities.  She was given education on specific activities to promote bone health.  #. Cancer screening:  Due to Ms. Sohail's history and her age, she should receive screening for skin cancers, colon cancer, and gynecologic cancers.  The information and recommendations are listed on the patient's comprehensive care plan/treatment summary and were reviewed in detail with the patient.    #. Health maintenance and wellness promotion: Ms. Macfarlane was encouraged to consume 5-7 servings of fruits and vegetables per day. We reviewed the "Nutrition Rainbow" handout,y.  She was also encouraged to engage in moderate to vigorous exercise for 30 minutes per day most days of the week. We discussed the [employer] fitness program, which is designed for cancer survivors to help them become more physically fit after cancer treatments.  She was instructed to limit her alcohol consumption and continue to abstain from tobacco use/***was encouraged stop smoking.     #. Support services/counseling: It is not uncommon for this period of the patient's cancer care trajectory to be one of many emotions and stressors.  We discussed how this can be increasingly difficult during the times of quarantine and social distancing due to the COVID-19 pandemic.   She was given information regarding our available services and encouraged to contact me with any questions or for help enrolling in any of our support group/programs.    Follow up instructions:    -Return to cancer center ***  -Mammogram due  in *** -Follow up with surgery *** -She is welcome to return back  to the Survivorship Clinic at any time; no additional follow-up needed at this time.  -Consider referral back to survivorship as a long-term survivor for continued surveillance  The patient was provided an opportunity to ask questions and all were answered. The patient agreed with the plan and demonstrated an understanding of the instructions.   Total encounter time:*** minutes*in face-to-face visit time, chart review, lab review, care coordination, order entry, and documentation of the encounter time.    Wilber Bihari, NP 10/05/22 8:33 AM Medical Oncology and Hematology [facility] [place], [place] [address] Tel. [phone]    Fax. [phone]  *Total Encounter Time as defined by the Centers for Medicare and Medicaid Services includes, in addition to the face-to-face time of a patient visit (documented in the note above) non-face-to-face time: obtaining and reviewing outside history, ordering and reviewing medications, tests or procedures, care coordination (communications with other health care professionals or caregivers) and documentation in the medical record.

## 2022-10-12 ENCOUNTER — Encounter: Payer: Self-pay | Admitting: Gastroenterology

## 2022-10-21 ENCOUNTER — Encounter: Payer: Self-pay | Admitting: Family Medicine

## 2022-10-21 ENCOUNTER — Ambulatory Visit (INDEPENDENT_AMBULATORY_CARE_PROVIDER_SITE_OTHER): Payer: Medicare Other | Admitting: Family Medicine

## 2022-10-21 VITALS — BP 131/73 | HR 77 | Ht 63.0 in | Wt 149.0 lb

## 2022-10-21 DIAGNOSIS — R222 Localized swelling, mass and lump, trunk: Secondary | ICD-10-CM

## 2022-10-21 DIAGNOSIS — Z23 Encounter for immunization: Secondary | ICD-10-CM

## 2022-10-21 DIAGNOSIS — R0781 Pleurodynia: Secondary | ICD-10-CM

## 2022-10-21 DIAGNOSIS — R0789 Other chest pain: Secondary | ICD-10-CM

## 2022-10-21 NOTE — Progress Notes (Signed)
   Established Patient Office Visit  Subjective   Patient ID: Lori Mcmillan, female    DOB: May 13, 1961  Age: 62 y.o. MRN: [id number]  Chief Complaint  Patient presents with   Follow-up         HPI  Hx of BrBa and received chemo and radiation  Last infusion was in Sept. she has been doing well since then but noticed that she felt like the right anterior rib area was more prominent on her right side back in November.  She feels like the area has gotten a little bit larger and even had her sister look at it who also agreed.  She says sometimes it can feel a little sore or tender.  But it feels like a discomfort that is underneath the ribs not on the surface of the ribs.  She denies any abdominal pain discomfort GI symptoms, blood in the stool.  She does report some occasional shortness of breath but says it is not new.  She has had prior abdominal surgery on that right side as well as 2 breast lumpectomies on that side.  She was recently on Taxol and Herceptin.  He had echocardiogram in July that was reassuring.      ROS    Objective:     BP 131/73   Pulse 77   Ht '5\' 3"'$  (1.6 m)   Wt 149 lb (67.6 kg)   LMP 05/11/2013   SpO2 97%   BMI 26.39 kg/m    Physical Exam Vitals and nursing note reviewed.  Constitutional:      Appearance: She is well-developed.  HENT:     Head: Normocephalic and atraumatic.  Cardiovascular:     Rate and Rhythm: Normal rate and regular rhythm.     Heart sounds: Normal heart sounds.  Pulmonary:     Effort: Pulmonary effort is normal.     Breath sounds: Normal breath sounds.  Musculoskeletal:     Comments: Does have what looks like a fullness over that right anterior rib cage.  And then just above that she has a palpable knot.    Skin:    General: Skin is warm and dry.  Neurological:     Mental Status: She is alert and oriented to person, place, and time.  Psychiatric:        Behavior: Behavior normal.      No results found for any  visits on 10/21/22.    The 10-year ASCVD risk score (Arnett DK, et al., 2019) is: 2.9%* (Cholesterol units were assumed)    Assessment & Plan:   Problem List Items Addressed This Visit   None Visit Diagnoses     Rib pain    -  Primary   Relevant Orders   CT Chest W Contrast   Localized swelling of chest wall       Relevant Orders   CT Chest W Contrast   Need for immunization against influenza       Relevant Orders   Flu Vaccine QUAD 38moIM (Fluarix, Fluzone & Alfiuria Quad PF) (Completed)      She does have some swelling or lump on the right anterior lower rib cage.  We discussed further workup with chest CT.  Unclear etiology.  She does have cancer history which is certainly concerning.  Lungs are clear on exam  Flu vaccine given today.  No follow-ups on file.    CBeatrice Lecher MD

## 2022-11-01 ENCOUNTER — Encounter: Payer: Self-pay | Admitting: Hematology and Oncology

## 2022-11-01 ENCOUNTER — Ambulatory Visit (INDEPENDENT_AMBULATORY_CARE_PROVIDER_SITE_OTHER): Payer: Medicare Other

## 2022-11-01 DIAGNOSIS — R0789 Other chest pain: Secondary | ICD-10-CM | POA: Diagnosis not present

## 2022-11-01 DIAGNOSIS — C50919 Malignant neoplasm of unspecified site of unspecified female breast: Secondary | ICD-10-CM | POA: Diagnosis not present

## 2022-11-01 DIAGNOSIS — R0781 Pleurodynia: Secondary | ICD-10-CM

## 2022-11-01 DIAGNOSIS — R222 Localized swelling, mass and lump, trunk: Secondary | ICD-10-CM | POA: Diagnosis not present

## 2022-11-01 DIAGNOSIS — R911 Solitary pulmonary nodule: Secondary | ICD-10-CM | POA: Diagnosis not present

## 2022-11-01 MED ORDER — IOHEXOL 300 MG/ML  SOLN
100.0000 mL | Freq: Once | INTRAMUSCULAR | Status: AC | PRN
  Administered 2022-11-01: 75 mL via INTRAVENOUS

## 2022-11-01 NOTE — Progress Notes (Signed)
Hi Lori Mcmillan, they did not see anything worrisome along the chest wall which was the area that we were taking a look at.  They did see some nodules in your right lung in the upper lobe area the largest was 5 mm which is pretty small.  But they did recommend that we repeat a CT of your chest in 3 months to follow this up.  They saw some postoperative changes in the right breast and axilla consistent with fat necrosis.  This can cause some irregularity of the tissue but is not a worrisome finding.

## 2022-11-02 NOTE — Assessment & Plan Note (Addendum)
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  Adjuvant chemotherapy with Taxol and Herceptin followed by Herceptin maintenance completed 07/06/2022 2. breast radiation completed 01/04/2022 ----------------------------------------------------------------------------------------------------------------------------------- Current treatment: Surveillance   Breast cancer surveillance: Breast exam 11/02/2022: Benign Mammogram 05/07/2022: Benign  CT chest on 11/01/2022: Right upper lobe nodules largest measuring 5 mm  [place] and I discussed with her that these changes are very likely related to prior exposure and unlikely related to cancer.  She is a non-smoker.  We will obtain a CT chest in 3 months and telephone visit after that to discuss results.  She has a survivorship appointment coming up next week.

## 2022-11-03 ENCOUNTER — Inpatient Hospital Stay: Payer: 59 | Attending: Hematology and Oncology | Admitting: Hematology and Oncology

## 2022-11-03 DIAGNOSIS — C50111 Malignant neoplasm of central portion of right female breast: Secondary | ICD-10-CM

## 2022-11-03 DIAGNOSIS — Z171 Estrogen receptor negative status [ER-]: Secondary | ICD-10-CM

## 2022-11-03 DIAGNOSIS — R918 Other nonspecific abnormal finding of lung field: Secondary | ICD-10-CM

## 2022-11-03 NOTE — Progress Notes (Signed)
HEMATOLOGY-ONCOLOGY TELEPHONE VISIT PROGRESS NOTE  I connected with our patient on 11/03/22 at  1:45 PM EST by telephone and verified that I am speaking with the correct person using two identifiers.  I discussed the limitations, risks, security and privacy concerns of performing an evaluation and management service by telephone and the availability of in person appointments.  I also discussed with the patient that there may be a patient responsible charge related to this service. The patient expressed understanding and agreed to proceed.   History of Present Illness:  Lori Mcmillan is a 62 y.o with the above mention. She presents to the clinic today for a telephone follow-up to review scans.  Oncology History  Malignant neoplasm of central portion of right breast in female, estrogen receptor negative ([place])  01/04/2012 Initial Diagnosis   Right breast cancer: Stage Ia ER/PR positive HER2 negative status postlumpectomy, radiation (low risk Oncotype) could not tolerate tamoxifen for more than 30 days.   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 No pathogenic variants identified on the [facility] Multi-Cancer Panel+RNA. VUS in POLD1 identified called c.2429C>T. The report date is  07/01/2021.  The Multi-Cancer Panel + RNA offered by [facility] includes sequencing and/or deletion duplication testing of the following 84 genes: AIP, ALK, APC, ATM, AXIN2,BAP1,  BARD1, BLM, BMPR1A, BRCA1, BRCA2, BRIP1, CASR, CDC73, CDH1, CDK4, CDKN1B, CDKN1C, CDKN2A (p14ARF), CDKN2A (p16INK4a), CEBPA, CHEK2, CTNNA1, DICER1, DIS3L2, EGFR (c.2369C>T, p.Thr790Met variant only), EPCAM (Deletion/duplication testing only), FH, FLCN, GATA2, GPC3, GREM1 (Promoter region deletion/duplication testing only), HOXB13 (c.251G>A, p.Gly84Glu), HRAS, KIT, MAX, MEN1, MET, MITF (c.952G>A, p.Glu318Lys variant only), MLH1, MSH2, MSH3, MSH6, MUTYH, NBN, NF1, NF2, NTHL1, PALB2, PDGFRA, PHOX2B, PMS2, POLD1, POLE, POT1, PRKAR1A, PTCH1, PTEN, RAD50, RAD51C, RAD51D, RB1, RECQL4, RET, RUNX1, SDHAF2, SDHA (sequence changes only), SDHB, SDHC, SDHD, SMAD4, SMARCA4, SMARCB1, SMARCE1, STK11, SUFU, TERC, TERT, TMEM127, TP53, TSC1, TSC2, VHL, WRN and WT1.   06/18/2021 Cancer Staging   Staging form: Breast, AJCC 8th Edition - Pathologic stage from 06/18/2021: Stage IA (pT1c, pN0, cM0, G3, ER-, PR-, HER2+) - Signed by Gardenia Phlegm, NP on 10/05/2022 Stage prefix: Initial diagnosis Histologic grading system: 3 grade system   07/21/2021 - 07/06/2022 Chemotherapy   Patient is on Treatment Plan : BREAST Paclitaxel + Trastuzumab q7d / Trastuzumab q21d     11/25/2021 - 01/04/2022 Radiation Therapy   Site Technique Total Dose (Gy) Dose per Fx (Gy) Completed Fx Beam Energies  Breast, Right: Breast_R 3D 45/45 1.8 25/25 6XFFF  Breast, Right: Breast_R_Bst specialPort 5.4/5.4 1.8 3/3 12E       REVIEW OF SYSTEMS:   Constitutional: Denies fevers, chills or abnormal weight loss All other systems were reviewed with the patient and are negative. Observations/Objective:  Assessment Plan:  Malignant neoplasm of central portion of right breast in female, estrogen receptor negative ([place]) 06/18/2021:Right lumpectomy: Grade 3 IDC 1.5 cm,  high-grade DCIS, margins negative, 0/2 lymph nodes negative, ER 0%, PR 0%, HER2 3+ positive, Ki-67 30%    (2013 Right breast cancer: Stage Ia ER/PR positive HER2 negative status postlumpectomy, radiation (low risk Oncotype) could not tolerate tamoxifen for more than 30 days.)   Treatment plan: 1.  Adjuvant chemotherapy with Taxol and Herceptin followed by Herceptin maintenance completed 07/06/2022 2. breast radiation completed 01/04/2022 ----------------------------------------------------------------------------------------------------------------------------------- Current treatment: Surveillance   Breast cancer surveillance: Breast exam 11/02/2022: Benign Mammogram 05/07/2022: Benign  CT chest on 11/01/2022: Right upper lobe nodules largest measuring 5 mm  [place] and I discussed with her that these changes are very likely related to prior exposure and unlikely related to cancer.  She is a non-smoker.  We will obtain a CT chest in 3 months and telephone visit after that to discuss results.  Right breast pain: Mammogram revealed fat necrosis.  We discussed different things that can help with the pain including topical moisturizers and essential oils as well as Voltaren gel.  She is not interested in gabapentin at this time.  She has a survivorship appointment coming up next week.   I discussed the assessment and treatment plan with the patient. The patient was provided an opportunity to ask questions and all were answered. The patient agreed with the plan and demonstrated an understanding of the instructions. The patient was advised to call back or seek an in-person evaluation if the symptoms worsen or if the condition fails to improve as anticipated.   I provided 12 minutes of non-face-to-face time during this encounter.  This includes time for charting and coordination of care   Harriette Ohara, MD  I Gardiner Coins am acting as a scribe for Dr.Minda Faas  I have  reviewed the above documentation for accuracy and completeness, and I agree with the above.

## 2022-11-04 ENCOUNTER — Encounter: Payer: Self-pay | Admitting: Hematology and Oncology

## 2022-11-10 ENCOUNTER — Encounter: Payer: Medicare Other | Admitting: Adult Health

## 2022-11-19 ENCOUNTER — Other Ambulatory Visit: Payer: Self-pay | Admitting: *Deleted

## 2022-11-19 DIAGNOSIS — R918 Other nonspecific abnormal finding of lung field: Secondary | ICD-10-CM

## 2022-11-19 DIAGNOSIS — Z171 Estrogen receptor negative status [ER-]: Secondary | ICD-10-CM

## 2022-11-24 ENCOUNTER — Other Ambulatory Visit: Payer: Self-pay | Admitting: *Deleted

## 2022-12-28 ENCOUNTER — Ambulatory Visit (HOSPITAL_COMMUNITY)
Admission: RE | Admit: 2022-12-28 | Discharge: 2022-12-28 | Disposition: A | Discharge status: Home / Self Care | Payer: 59 | Source: Ambulatory Visit | Attending: Hematology and Oncology | Admitting: Hematology and Oncology

## 2022-12-28 DIAGNOSIS — R918 Other nonspecific abnormal finding of lung field: Secondary | ICD-10-CM | POA: Diagnosis not present

## 2022-12-28 MED ORDER — SODIUM CHLORIDE (PF) 0.9 % IJ SOLN
INTRAMUSCULAR | Status: AC
  Filled 2022-12-28: qty 50

## 2022-12-28 MED ORDER — IOHEXOL 300 MG/ML  SOLN
75.0000 mL | Freq: Once | INTRAMUSCULAR | Status: AC | PRN
  Administered 2022-12-28: 75 mL via INTRAVENOUS

## 2022-12-30 ENCOUNTER — Telehealth: Payer: Self-pay

## 2022-12-30 ENCOUNTER — Inpatient Hospital Stay: Payer: 59 | Attending: Hematology and Oncology | Admitting: Hematology and Oncology

## 2022-12-30 ENCOUNTER — Encounter: Payer: Self-pay | Admitting: Hematology and Oncology

## 2022-12-30 DIAGNOSIS — Z171 Estrogen receptor negative status [ER-]: Secondary | ICD-10-CM | POA: Diagnosis not present

## 2022-12-30 DIAGNOSIS — C50919 Malignant neoplasm of unspecified site of unspecified female breast: Secondary | ICD-10-CM | POA: Diagnosis not present

## 2022-12-30 DIAGNOSIS — C50111 Malignant neoplasm of central portion of right female breast: Secondary | ICD-10-CM

## 2022-12-30 DIAGNOSIS — C787 Secondary malignant neoplasm of liver and intrahepatic bile duct: Secondary | ICD-10-CM | POA: Diagnosis not present

## 2022-12-30 NOTE — Progress Notes (Signed)
Aletta Edouard, MD  Donita Brooks D PROCEDURE / BIOPSY REVIEW Date: 12/30/22  Requested Biopsy site: Liver Reason for request: Lesion Imaging review: Best seen on CT  Decision: Approved Imaging modality to perform: Ultrasound Schedule with: Moderate Sedation Schedule for: Any VIR  Additional comments: Send for breast prognostic panel and [facility] per Dr. Lindi Adie below.  Please contact me with questions, concerns, or if issue pertaining to this request arise.  Azzie Roup, MD [facility]

## 2022-12-30 NOTE — Telephone Encounter (Signed)
Pt's telephone visit r/s from 3/19 to today. Pt is aware MD will call her at any point today. She verbalized thanks and understanding.

## 2022-12-30 NOTE — Assessment & Plan Note (Signed)
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  Adjuvant chemotherapy with Taxol and Herceptin followed by Herceptin maintenance completed 07/06/2022 2. breast radiation completed 01/04/2022 ----------------------------------------------------------------------------------------------------------------------------------- Current treatment: Surveillance  CT chest on 11/01/2022: Right upper lobe nodules largest measuring 5 mm CT chest 12/29/2022: Upper and mid lung zone predominant pulmonary nodules are worrisome for metastatic disease.  Left and right hepatic lobe masses (2.5 cm and 3.9 cm) are new and are worrisome for metastatic disease.  Plan: PET CT scan Biopsy of the liver lesion  Follow-up in 10 days to discuss results.

## 2022-12-30 NOTE — Progress Notes (Signed)
HEMATOLOGY-ONCOLOGY TELEPHONE VISIT PROGRESS NOTE  I connected with our patient on 12/30/22 at 12:30 PM EDT by telephone and verified that I am speaking with the correct person using two identifiers.  I discussed the limitations, risks, security and privacy concerns of performing an evaluation and management service by telephone and the availability of in person appointments.  I also discussed with the patient that there may be a patient responsible charge related to this service. The patient expressed understanding and agreed to proceed.   History of Present Illness: Follow-up to discuss the results of the CT chest  Oncology History  Malignant neoplasm of central portion of right breast in female, estrogen receptor negative ([place])  01/04/2012 Initial Diagnosis   Right breast cancer: Stage Ia ER/PR positive HER2 negative status postlumpectomy, radiation (low risk Oncotype) could not tolerate tamoxifen for more than 30 days.   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 No pathogenic variants identified on the [facility] Multi-Cancer Panel+RNA. VUS in POLD1 identified called c.2429C>T. The report date is 07/01/2021.  The Multi-Cancer Panel + RNA offered by [facility] includes sequencing and/or  deletion duplication testing of the following 84 genes: AIP, ALK, APC, ATM, AXIN2,BAP1,  BARD1, BLM, BMPR1A, BRCA1, BRCA2, BRIP1, CASR, CDC73, CDH1, CDK4, CDKN1B, CDKN1C, CDKN2A (p14ARF), CDKN2A (p16INK4a), CEBPA, CHEK2, CTNNA1, DICER1, DIS3L2, EGFR (c.2369C>T, p.Thr790Met variant only), EPCAM (Deletion/duplication testing only), FH, FLCN, GATA2, GPC3, GREM1 (Promoter region deletion/duplication testing only), HOXB13 (c.251G>A, p.Gly84Glu), HRAS, KIT, MAX, MEN1, MET, MITF (c.952G>A, p.Glu318Lys variant only), MLH1, MSH2, MSH3, MSH6, MUTYH, NBN, NF1, NF2, NTHL1, PALB2, PDGFRA, PHOX2B, PMS2, POLD1, POLE, POT1, PRKAR1A, PTCH1, PTEN, RAD50, RAD51C, RAD51D, RB1, RECQL4, RET, RUNX1, SDHAF2, SDHA (sequence changes only), SDHB, SDHC, SDHD, SMAD4, SMARCA4, SMARCB1, SMARCE1, STK11, SUFU, TERC, TERT, TMEM127, TP53, TSC1, TSC2, VHL, WRN and WT1.   06/18/2021 Cancer Staging   Staging form: Breast, AJCC 8th Edition - Pathologic stage from 06/18/2021: Stage IA (pT1c, pN0, cM0, G3, ER-, PR-, HER2+) - Signed by Gardenia Phlegm, NP on 10/05/2022 Stage prefix: Initial diagnosis Histologic grading system: 3 grade system   07/21/2021 - 07/06/2022 Chemotherapy   Patient is on Treatment Plan : BREAST Paclitaxel + Trastuzumab q7d / Trastuzumab q21d     11/25/2021 - 01/04/2022 Radiation Therapy   Site Technique Total Dose (Gy) Dose per Fx (Gy) Completed Fx Beam Energies  Breast, Right: Breast_R 3D 45/45 1.8 25/25 6XFFF  Breast, Right: Breast_R_Bst specialPort 5.4/5.4 1.8 3/3 12E       REVIEW OF SYSTEMS:   Constitutional: Denies fevers, chills or abnormal weight loss All other systems were reviewed with the patient and are negative. Observations/Objective:     Assessment Plan:  Malignant neoplasm of central portion of right breast in female, estrogen receptor negative ([place])  06/18/2021:Right lumpectomy: Grade 3 IDC 1.5 cm, high-grade DCIS, margins negative, 0/2 lymph nodes negative, ER 0%, PR 0%, HER2 3+ positive,  Ki-67 30%    (2013 Right breast cancer: Stage Ia ER/PR positive HER2 negative status postlumpectomy, radiation (low risk Oncotype) could not tolerate tamoxifen for more than 30 days.)   Treatment plan: 1.  Adjuvant chemotherapy with Taxol and Herceptin followed by Herceptin maintenance completed 07/06/2022 2. breast radiation completed 01/04/2022 ----------------------------------------------------------------------------------------------------------------------------------- Current treatment: Surveillance  CT chest on 11/01/2022: Right upper lobe nodules largest measuring 5 mm CT chest 12/29/2022: Upper and mid lung zone predominant pulmonary nodules are worrisome for metastatic disease.  Left and right hepatic lobe masses (2.5 cm and 3.9 cm) are new and are worrisome for metastatic disease.  Plan: PET CT scan Biopsy of the liver lesion  Follow-up in 3 weeks to discuss results.   I discussed the assessment and treatment plan with the patient. The patient was provided an opportunity to ask questions and all were answered. The patient agreed with the plan and demonstrated an understanding of the instructions. The patient was advised to call back or seek an in-person evaluation if the symptoms worsen or if the condition fails to improve as anticipated.   I provided 12 minutes of non-face-to-face time during this encounter.  This includes time for charting and coordination of care   Harriette Ohara, MD

## 2022-12-31 ENCOUNTER — Telehealth: Payer: Self-pay | Admitting: Hematology and Oncology

## 2022-12-31 NOTE — Telephone Encounter (Signed)
Scheduled appointment per 3/14 los. Patient is aware of the made appointment.

## 2023-01-04 ENCOUNTER — Telehealth: Payer: 59 | Admitting: Hematology and Oncology

## 2023-01-12 ENCOUNTER — Other Ambulatory Visit: Payer: Self-pay | Admitting: Radiology

## 2023-01-12 DIAGNOSIS — C50919 Malignant neoplasm of unspecified site of unspecified female breast: Secondary | ICD-10-CM

## 2023-01-12 NOTE — Progress Notes (Signed)
Patient Care Team: Everrett Coombe, DO as PCP - General (Family Medicine) [name], MD as Consulting Physician (Radiation Oncology) Barbaraann Cao Georgeanna Lea, MD as Consulting Physician (Oncology) [facility] [place] ([facility]) Domenic Schwab, Lucianne Lei, MD as Referring Physician (Dermatology) Graylin Shiver, MD as Referring Physician (Otolaryngology) Serena Croissant, MD as Consulting Physician (Hematology and Oncology) Harriette Bouillon, MD as Consulting Physician (General Surgery)  DIAGNOSIS:  Encounter Diagnoses  Name Primary?   Malignant neoplasm of central portion of right breast in female, estrogen receptor negative Yes   Cardiotoxicity     SUMMARY OF ONCOLOGIC HISTORY: Oncology History  Malignant neoplasm of central portion of right breast in female, estrogen receptor negative  01/04/2012 Initial Diagnosis   Right breast cancer: Stage Ia ER/PR positive HER2 negative status postlumpectomy, radiation (low risk Oncotype) could not tolerate tamoxifen for more than 30 days.   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 No pathogenic variants identified on the [facility] Multi-Cancer Panel+RNA. VUS in POLD1 identified called c.2429C>T. The report date is 07/01/2021.  The  Multi-Cancer Panel + RNA offered by [facility] includes sequencing and/or deletion duplication testing of the following 84 genes: AIP, ALK, APC, ATM, AXIN2,BAP1,  BARD1, BLM, BMPR1A, BRCA1, BRCA2, BRIP1, CASR, CDC73, CDH1, CDK4, CDKN1B, CDKN1C, CDKN2A (p14ARF), CDKN2A (p16INK4a), CEBPA, CHEK2, CTNNA1, DICER1, DIS3L2, EGFR (c.2369C>T, p.Thr790Met variant only), EPCAM (Deletion/duplication testing only), FH, FLCN, GATA2, GPC3, GREM1 (Promoter region deletion/duplication testing only), HOXB13 (c.251G>A, p.Gly84Glu), HRAS, KIT, MAX, MEN1, MET, MITF (c.952G>A, p.Glu318Lys variant only), MLH1, MSH2, MSH3, MSH6, MUTYH, NBN, NF1, NF2, NTHL1, PALB2, PDGFRA, PHOX2B, PMS2, POLD1, POLE, POT1, PRKAR1A, PTCH1, PTEN, RAD50, RAD51C, RAD51D, RB1, RECQL4, RET, RUNX1, SDHAF2, SDHA (sequence changes only), SDHB, SDHC, SDHD, SMAD4, SMARCA4, SMARCB1, SMARCE1, STK11, SUFU, TERC, TERT, TMEM127, TP53, TSC1, TSC2, VHL, WRN and WT1.   06/18/2021 Cancer Staging   Staging form: Breast, AJCC 8th Edition - Pathologic stage from 06/18/2021: Stage IA (pT1c, pN0, cM0, G3, ER-, PR-, HER2+) - Signed by [name], NP on 10/05/2022 Stage prefix: Initial diagnosis Histologic grading system: 3 grade system   07/21/2021 - 07/06/2022 Chemotherapy   Patient is on Treatment Plan : BREAST Paclitaxel + Trastuzumab q7d / Trastuzumab q21d     11/25/2021 - 01/04/2022 Radiation Therapy   Site Technique Total Dose (Gy) Dose per Fx (Gy) Completed Fx Beam Energies  Breast, Right: Breast_R 3D 45/45 1.8 25/25 6XFFF  Breast, Right: Breast_R_Bst specialPort 5.4/5.4 1.8 3/3 12E       CHIEF COMPLIANT: Follow-up metastatic breast cancer  INTERVAL HISTORY: Lori Mcmillan is a 62 y.o with the above mentioned metastatic breast cancer.  Patient underwent a PET CT scan and a liver biopsy and is here today to discuss results accompanied by her sister.  She  is asymptomatic and is otherwise feeling well.  ALLERGIES:  has No Known  Allergies.  MEDICATIONS:  Current Outpatient Medications  Medication Sig Dispense Refill   acetaminophen (TYLENOL) 500 MG tablet Take 500 mg by mouth as needed.     ALPRAZolam (XANAX) 0.25 MG tablet TAKE 1 TABLET BY MOUTH 2 TIMES DAILY AS NEEDED FOR ANXIETY. 30 tablet 3   diazepam (VALIUM) 2 MG tablet Take by mouth as needed.     Famotidine-Ca Carb-Mag Hydrox (PEPCID COMPLETE PO) Take by mouth as needed.     No current facility-administered medications for this visit.    PHYSICAL EXAMINATION: ECOG PERFORMANCE STATUS: 1 - Symptomatic but completely ambulatory  Vitals:   01/21/23 1212  BP: 115/76  Pulse: 79  Resp: 18  Temp: 97.9 F (36.6 C)  SpO2: 97%   Filed Weights   01/21/23 1212  Weight: 150 lb 12.8 oz (68.4 kg)     LABORATORY DATA:  I have reviewed the data as listed    Latest Ref Rng & Units 01/13/2023   12:00 PM 07/06/2022    9:55 AM 05/25/2022    9:04 AM  CMP  Glucose 70 - 99 mg/dL 87  94  97   BUN 6 - 20 mg/dL 13  8  12    Creatinine 0.44 - 1.00 mg/dL 4.090.63  8.110.71  9.140.68   Sodium 135 - 145 mmol/L 138  138  136   Potassium 3.5 - 5.1 mmol/L 3.8  4.3  4.3   Chloride 98 - 111 mmol/L 106  107  106   CO2 22 - 32 mmol/L 23  26  26    Calcium 8.9 - 10.3 mg/dL 8.7  8.8  8.8   Total Protein 6.5 - 8.1 g/dL 6.8  6.8  7.0   Total Bilirubin 0.3 - 1.2 mg/dL 0.7  0.3  0.3   Alkaline Phos 38 - 126 U/L 76  69  72   AST 15 - 41 U/L 21  13  12    ALT 0 - 44 U/L 20  10  9      Lab Results  Component Value Date   WBC 7.0 01/13/2023   HGB 12.4 01/13/2023   HCT 39.1 01/13/2023   MCV 92.9 01/13/2023   PLT 275 01/13/2023   NEUTROABS 4.6 01/13/2023    ASSESSMENT & PLAN:  Malignant neoplasm of central portion of right breast in female, estrogen receptor negative ([facility]) 06/18/2021:Right lumpectomy: Grade 3 IDC 1.5 cm, high-grade DCIS, margins negative, 0/2 lymph nodes negative, ER 0%, PR 0%, HER2 3+ positive, Ki-67 30%    (2013 Right breast cancer: Stage Ia ER/PR positive HER2 negative  status postlumpectomy, radiation (low risk Oncotype) could not tolerate tamoxifen for more than 30 days.)   Treatment plan: 1.  Adjuvant chemotherapy with Taxol and Herceptin followed by Herceptin maintenance completed 07/06/2022 2. breast radiation completed 01/04/2022 3.  Metastatic disease diagnosis: --------------------------------------------------------------------------------------------------------------------- CT chest on 11/01/2022: Right upper lobe nodules largest measuring 5 mm CT chest 12/29/2022: Upper and mid lung zone predominant pulmonary nodules are worrisome for metastatic disease.  Left and right hepatic lobe masses (2.5 cm and 3.9 cm) are new and are worrisome for metastatic disease. PET CT scan 01/17/2023: Hypermetabolic bilobar hepatic (2 masses measuring 2.8 cm SUV 11.1, another mass 4.1 cm SUV 10.3) and pulmonary metastases (right upper lobe lung nodule 5 mm SUV 2.6 right lower lobe 6 mm nodule SUV 1.8)  01/13/23: Liver Biopsy: Met breast cancer Awaiting the results of the breast prognostic  panel.  I will call her with there is results early next week. I discussed with her about treatment with Enhertu if she is HER2 positive or HER2 low. I will request for echocardiogram and port placement. If the prognostic panel is confirmed then we will plan to start treatment in 1 to 2 weeks.  I discussed with her about the risks and benefits of Enhertu.   Orders Placed This Encounter  Procedures   ECHOCARDIOGRAM COMPLETE    Standing Status:   Future    Standing Expiration Date:   01/21/2024    Order Specific Question:   Where should this test be performed    Answer:   [facility]    Order Specific Question:   Perflutren DEFINITY (image enhancing agent) should be administered unless hypersensitivity or allergy exist    Answer:   Administer Perflutren    Order Specific Question:   Reason for exam-Echo    Answer:   Chemo  [id number]   The patient has a good understanding of the overall  plan. she agrees with it. she will call with any problems that may develop before the next visit here. Total time spent: 30 mins including face to face time and time spent for planning, charting and co-ordination of care   Tamsen Meek, MD 01/21/23    I Janan Ridge am acting as a Neurosurgeon for [employer]  I have reviewed the above documentation for accuracy and completeness, and I agree with the above.

## 2023-01-13 ENCOUNTER — Ambulatory Visit (HOSPITAL_COMMUNITY)
Admission: RE | Admit: 2023-01-13 | Discharge: 2023-01-13 | Disposition: A | Discharge status: Home / Self Care | Payer: 59 | Source: Ambulatory Visit | Attending: Hematology and Oncology | Admitting: Hematology and Oncology

## 2023-01-13 ENCOUNTER — Other Ambulatory Visit: Payer: Self-pay

## 2023-01-13 ENCOUNTER — Encounter (HOSPITAL_COMMUNITY): Payer: Self-pay

## 2023-01-13 ENCOUNTER — Other Ambulatory Visit (HOSPITAL_COMMUNITY): Payer: 59

## 2023-01-13 ENCOUNTER — Encounter: Payer: Self-pay | Admitting: Hematology and Oncology

## 2023-01-13 DIAGNOSIS — C50111 Malignant neoplasm of central portion of right female breast: Secondary | ICD-10-CM | POA: Insufficient documentation

## 2023-01-13 DIAGNOSIS — C50919 Malignant neoplasm of unspecified site of unspecified female breast: Secondary | ICD-10-CM

## 2023-01-13 DIAGNOSIS — K219 Gastro-esophageal reflux disease without esophagitis: Secondary | ICD-10-CM | POA: Diagnosis not present

## 2023-01-13 DIAGNOSIS — Z803 Family history of malignant neoplasm of breast: Secondary | ICD-10-CM | POA: Diagnosis not present

## 2023-01-13 DIAGNOSIS — Z171 Estrogen receptor negative status [ER-]: Secondary | ICD-10-CM | POA: Insufficient documentation

## 2023-01-13 DIAGNOSIS — C787 Secondary malignant neoplasm of liver and intrahepatic bile duct: Secondary | ICD-10-CM | POA: Insufficient documentation

## 2023-01-13 DIAGNOSIS — Z87891 Personal history of nicotine dependence: Secondary | ICD-10-CM | POA: Insufficient documentation

## 2023-01-13 DIAGNOSIS — K7689 Other specified diseases of liver: Secondary | ICD-10-CM | POA: Diagnosis not present

## 2023-01-13 DIAGNOSIS — C801 Malignant (primary) neoplasm, unspecified: Secondary | ICD-10-CM | POA: Diagnosis not present

## 2023-01-13 LAB — COMPREHENSIVE METABOLIC PANEL
ALT: 20 U/L (ref 0–44)
AST: 21 U/L (ref 15–41)
Albumin: 4.1 g/dL (ref 3.5–5.0)
Alkaline Phosphatase: 76 U/L (ref 38–126)
Anion gap: 9 (ref 5–15)
BUN: 13 mg/dL (ref 6–20)
CO2: 23 mmol/L (ref 22–32)
Calcium: 8.7 mg/dL — ABNORMAL LOW (ref 8.9–10.3)
Chloride: 106 mmol/L (ref 98–111)
Creatinine, Ser: 0.63 mg/dL (ref 0.44–1.00)
GFR, Estimated: >60 mL/min (ref >60)
Glucose, Bld: 87 mg/dL (ref 70–99)
Potassium: 3.8 mmol/L (ref 3.5–5.1)
Sodium: 138 mmol/L (ref 135–145)
Total Bilirubin: 0.7 mg/dL (ref 0.3–1.2)
Total Protein: 6.8 g/dL (ref 6.5–8.1)

## 2023-01-13 LAB — CBC WITH DIFFERENTIAL/PLATELET
Abs Immature Granulocytes: 0.02 10*3/uL (ref 0.00–0.07)
Basophils Absolute: 0.1 10*3/uL (ref 0.0–0.1)
Basophils Relative: 1 %
Eosinophils Absolute: 0.1 10*3/uL (ref 0.0–0.5)
Eosinophils Relative: 2 %
HCT: 39.1 % (ref 36.0–46.0)
Hemoglobin: 12.4 g/dL (ref 12.0–15.0)
Immature Granulocytes: 0 %
Lymphocytes Relative: 23 %
Lymphs Abs: 1.6 10*3/uL (ref 0.7–4.0)
MCH: 29.5 pg (ref 26.0–34.0)
MCHC: 31.7 g/dL (ref 30.0–36.0)
MCV: 92.9 fL (ref 80.0–100.0)
Monocytes Absolute: 0.6 10*3/uL (ref 0.1–1.0)
Monocytes Relative: 9 %
Neutro Abs: 4.6 10*3/uL (ref 1.7–7.7)
Neutrophils Relative %: 65 %
Platelets: 275 10*3/uL (ref 150–400)
RBC: 4.21 MIL/uL (ref 3.87–5.11)
RDW: 13.1 % (ref 11.5–15.5)
WBC: 7 10*3/uL (ref 4.0–10.5)
nRBC: 0 % (ref 0.0–0.2)

## 2023-01-13 LAB — PROTIME-INR
INR: 1 (ref 0.8–1.2)
Prothrombin Time: 12.8 seconds (ref 11.4–15.2)

## 2023-01-13 MED ORDER — ACETAMINOPHEN 500 MG PO TABS
1000.0000 mg | ORAL_TABLET | Freq: Once | ORAL | Status: AC
  Administered 2023-01-13: 1000 mg via ORAL

## 2023-01-13 MED ORDER — FENTANYL CITRATE (PF) 100 MCG/2ML IJ SOLN
INTRAMUSCULAR | Status: AC | PRN
  Administered 2023-01-13 (×2): 50 ug via INTRAVENOUS

## 2023-01-13 MED ORDER — LIDOCAINE HCL 1 % IJ SOLN
INTRAMUSCULAR | Status: AC
  Administered 2023-01-13: 10 mL
  Filled 2023-01-13: qty 20

## 2023-01-13 MED ORDER — MIDAZOLAM HCL 2 MG/2ML IJ SOLN
INTRAMUSCULAR | Status: AC | PRN
  Administered 2023-01-13 (×2): 1 mg via INTRAVENOUS

## 2023-01-13 MED ORDER — MIDAZOLAM HCL 2 MG/2ML IJ SOLN
INTRAMUSCULAR | Status: AC
  Filled 2023-01-13: qty 2

## 2023-01-13 MED ORDER — ACETAMINOPHEN 500 MG PO TABS
ORAL_TABLET | ORAL | Status: AC
  Filled 2023-01-13: qty 2

## 2023-01-13 MED ORDER — FENTANYL CITRATE (PF) 100 MCG/2ML IJ SOLN
INTRAMUSCULAR | Status: AC
  Filled 2023-01-13: qty 2

## 2023-01-13 MED ORDER — GELATIN ABSORBABLE 12-7 MM EX MISC
CUTANEOUS | Status: AC
  Administered 2023-01-13: 1
  Filled 2023-01-13: qty 1

## 2023-01-13 MED ORDER — SODIUM CHLORIDE 0.9 % IV SOLN: INTRAVENOUS | Status: DC

## 2023-01-13 NOTE — Sedation Documentation (Signed)
Samples collected by Dr. Annamaria Boots

## 2023-01-13 NOTE — Consult Note (Signed)
Chief Complaint: Patient was seen in consultation today for image guided liver lesion biopsy  Referring Physician(s): Bland  Supervising Physician: Daryll Brod  Patient Status: [facility] - Out-pt  History of Present Illness: Lori Mcmillan is a 62 y.o. female , ex smoker, with PMH sig for anxiety/depression, atypical meningioma of brain with resection 2019, GERD, basal cell skin cancer, migraines, seizures, and right breast cancer in 2013 and 2022, s/p lumpectomy/chemoradiation.Latest imaging revealed:  1. Upper and midlung zone predominant pulmonary nodules are worrisome for metastatic disease. 2. Left and right hepatic lobe masses are new from 01/18/2021 and also worrisome for metastatic disease.   She is scheduled today for US guided liver lesion biopsy for further evaluation.    Past Medical History:  Diagnosis Date   Allergy    SEASONAL   Anxiety    occ lorazepam   Atypical meningioma of brain ([place]) 2018/2019   Resected, then RT Feb/Mar 2019.  No sign of residual dz at 04/2018 rad onc f/u and 10/2018 neuro-onc f/u. 03/2019 MRI brain->no resid/no recurrence.   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   Chicken pox    Depression    zoloft in past--?wt gain.   Diplopia    chronic (meningioma-related)   Eustachian tube dysfunction 09/05/2013   Family history of breast cancer    Family history of colon cancer    Family history of melanoma    Family history of ovarian cancer    Family history of pancreatic cancer    GERD (gastroesophageal reflux disease)    History of radiation therapy 02/24/12-04/11/12   right breast/ 45Gy@1 .8Gyx76fx/boost=16Gy@2  [id number].  Latest mammo and u/s 03/2013--normal.   History of radiation therapy 11/30/17- 01/11/18   Right temporal lobe treated to 55.8 Gy with 31 fx of 1.8 Gy   Hx of basal cell carcinoma     Migraine    Palpitations    has taken Metoprolol for palpitations in the past.   Seizure ([place])    2 YEARS AGO,UPDATED 09/07/22   Taste impairment    s/p radiation therapy   Tobacco dependence 09/05/2013    Past Surgical History:  Procedure Laterality Date   APPENDECTOMY  2008   emergency   BRAIN TUMOR EXCISION  2018   Meningioma   BREAST LUMPECTOMY Right 01/04/2012   right breast   BREAST LUMPECTOMY WITH RADIOACTIVE SEED AND SENTINEL LYMPH NODE BIOPSY Right 06/18/2021   Procedure: RIGHT BREAST LUMPECTOMY WITH RADIOACTIVE SEED AND SENTINEL LYMPH NODE BIOPSY;  Surgeon: Erroll Luna, MD;  Location: [place];  Service: General;  Laterality: Right;   COLONOSCOPY     2014 ?   CRANIECTOMY  10/19/2017   at [place] Right 06/18/2021   Procedure: [place];  Surgeon: Erroll Luna, MD;  Location: [place];  Service: General;  Laterality: Right;   PORTACATH REMOVED     OCTOBER 10,2023   WISDOM TOOTH EXTRACTION  1990    Allergies: Patient has no known allergies.  Medications: Prior to Admission medications   Medication Sig Start Date End Date Taking? Authorizing Provider  acetaminophen (TYLENOL) 500 MG tablet Take 500 mg by mouth as needed.    [provider]  ALPRAZolam (XANAX) 0.25 MG tablet TAKE 1 TABLET BY MOUTH 2 TIMES DAILY AS NEEDED FOR ANXIETY. 09/02/22   Nicholas Lose,  MD  diazepam (VALIUM) 2 MG tablet Take by mouth as needed. 09/04/22   [provider]  Famotidine-Ca Carb-Mag Hydrox (PEPCID COMPLETE PO) Take by mouth as needed.    [provider]     Family History  Problem Relation Age of Onset   Anesthesia problems Mother    Breast cancer Mother 78       lumpectomy, chemo, radiation   Colon polyps Brother    Ovarian cancer Maternal Aunt 20   Cancer Maternal Aunt        unknown type   Breast cancer Maternal Aunt 80   Breast cancer Maternal Aunt 40        bilateral   Pancreatic cancer Maternal Uncle        dx 67s   Stomach cancer Paternal Uncle    Cancer Paternal Uncle        unknown type   Lung cancer Paternal Uncle        hx smoking   Dementia Maternal Grandmother    Lung cancer Maternal Grandfather        hx smoking   Pancreatic cancer Paternal Grandmother 47   Melanoma Paternal Grandmother        dx >50, shin   Heart Problems Paternal Grandfather    Colon cancer Cousin 62       maternal first cousin   Breast cancer Cousin        dx 7s, paternal first cousin   Crohn's disease Neg Hx    Esophageal cancer Neg Hx    Rectal cancer Neg Hx    Ulcerative colitis Neg Hx     Social History   Socioeconomic History   Marital status: Single    Spouse name: Not on file   Number of children: Not on file   Years of education: Not on file   Highest education level: Not on file  Occupational History   Not on file  Tobacco Use   Smoking status: Former    Packs/day: .5    Types: Cigarettes    Quit date: 10/17/2017    Years since quitting: 5.2    Passive exposure: Never   Smokeless tobacco: Never  Vaping Use   Vaping Use: Former  Substance and Sexual Activity   Alcohol use: Yes    Alcohol/week: 3.0 standard drinks of alcohol    Types: 3 Cans of beer per week    Comment: 3-4 beers/day   Drug use: Not Currently   Sexual activity: Not Currently    Partners: Male  Other Topics Concern   Not on file  Social History Narrative   Marital status/children/pets: Single.  No children.  Lives alone.  Has pets.Orig from [place] in [place].   Education/employment: Bachelor's degree, retired   Engineer, materials:      -smoke alarm in the home:Yes     - wears seatbelt: Yes               Social Determinants of Health   Financial Resource Strain: Not on file  Food Insecurity: Not on file  Transportation Needs: Not on file  Physical Activity: Not on file  Stress: Not on file  Social Connections: Not on file      Review of Systems denies  fever,HA,CP, cough, abd/back pain,N/V or bleeding; she does have occ dyspnea  Vital Signs:pending  LMP 05/11/2013   Code Status: FULL CODE    Physical Exam:  awake/alert; chest- CTA bilat; heart- RRR; abd- soft,+BS,NT; no LE edema  Imaging: CT Chest W Contrast  Result Date: 12/29/2022 CLINICAL DATA:  Lung nodule.  Breast cancer. EXAM: CT CHEST WITH CONTRAST TECHNIQUE: Multidetector CT imaging of the chest was performed during intravenous contrast administration. RADIATION DOSE REDUCTION: This exam was performed according to the departmental dose-optimization program which includes automated exposure control, adjustment of the mA and/or kV according to patient size and/or use of iterative reconstruction technique. CONTRAST:  60mL OMNIPAQUE IOHEXOL 300 MG/ML  SOLN COMPARISON:  11/01/2022 and CT abdomen 01/18/2021. FINDINGS: Cardiovascular: Heart size normal.  No pericardial effusion. Mediastinum/Nodes: No pathologically enlarged mediastinal, hilar or axillary lymph nodes. Surgical clips in the right axilla. Esophagus is grossly unremarkable. Lumpectomy changes in the medial right breast. Lungs/Pleura: Subpleural radiation scarring in the anterior right lung. Pulmonary nodules are upper and midlung zone predominant, measuring up to 5 mm in the superior segment right lower lobe (5/59), as on 11/01/2022. No new pulmonary nodules. No pleural fluid. Airway is unremarkable. Upper Abdomen: 2.2 x 2.5 cm low-attenuation lesion in the left hepatic lobe and 2.2 x 3.9 cm low-attenuation mass in the inferior right hepatic lobe (2/147), new from 01/18/2021. Visualized portions of the liver, gallbladder, adrenal glands, kidneys, spleen, pancreas, stomach and bowel are otherwise grossly unremarkable. No upper abdominal adenopathy. Musculoskeletal: Degenerative changes in the spine. IMPRESSION: 1. Upper and midlung zone predominant pulmonary nodules are worrisome for metastatic disease. 2. Left and right hepatic lobe  masses are new from 01/18/2021 and also worrisome for metastatic disease. Electronically Signed   By: Lorin Picket M.D.   On: 12/29/2022 14:14    Labs:  CBC: Recent Labs    03/02/22 1001 04/13/22 0855 05/25/22 0904 07/06/22 0955  WBC 8.0 7.9 6.4 6.4  HGB 11.3* 11.6* 11.6* 11.8*  HCT 34.4* 35.3* 35.5* 36.0  PLT 285 270 277 282    COAGS: No results for input(s): "INR", "APTT" in the last 8760 hours.  BMP: Recent Labs    03/02/22 1001 04/13/22 0855 05/25/22 0904 07/06/22 0955  NA 138 137 136 138  K 4.0 3.8 4.3 4.3  CL 106 107 106 107  CO2 28 26 26 26   GLUCOSE 88 95 97 94  BUN 13 15 12 8   CALCIUM 8.7* 9.3 8.8* 8.8*  CREATININE 0.71 0.78 0.68 0.71  GFRNONAA >60 >60 >60 >60    LIVER FUNCTION TESTS: Recent Labs    03/02/22 1001 04/13/22 0855 05/25/22 0904 07/06/22 0955  BILITOT 0.3 0.3 0.3 0.3  AST 13* 15 12* 13*  ALT 11 17 9 10   ALKPHOS 69 63 72 69  PROT 6.6 6.7 7.0 6.8  ALBUMIN 4.0 4.2 4.2 4.3    TUMOR MARKERS: No results for input(s): "AFPTM", "CEA", "CA199", "CHROMGRNA" in the last 8760 hours.  Assessment and Plan: 62 y.o. female , ex smoker, with PMH sig for anxiety/depression, atypical meningioma of brain with resection 2019, GERD, basal cell skin cancer, migraines, seizures, and right breast cancer in 2013 and 2022, s/p lumpectomy/chemoradiation.Latest imaging revealed:  1. Upper and midlung zone predominant pulmonary nodules are worrisome for metastatic disease. 2. Left and right hepatic lobe masses are new from 01/18/2021 and also worrisome for metastatic disease.   She is scheduled today for US guided liver lesion biopsy for further evaluation. Risks and benefits of image guided port-a-catheter placement was discussed with the patient including, but not limited to bleeding, infection, pneumothorax, or fibrin sheath development and need for additional procedures.  All of the patient's questions were answered, patient is agreeable to  proceed. Consent  signed and in chart.  LABS PENDING    Thank you for this interesting consult.  I greatly enjoyed meeting AIDY MEGNA and look forward to participating in their care.  A copy of this report was sent to the requesting provider on this date.  Electronically Signed: D. Rowe Robert, PA-C 01/13/2023, 11:49 AM   I spent a total of 25 minutes    in face to face in clinical consultation, greater than 50% of which was counseling/coordinating care for image guided liver lesion biopsy

## 2023-01-13 NOTE — Discharge Instructions (Signed)
Moderate Conscious Sedation, Adult, Care After This sheet gives you information about how to care for yourself after your procedure. Your health care provider may also give you more specific instructions. If you have problems or questions, contact your health care provider. What can I expect after the procedure? After the procedure, it is common to have: Sleepiness for several hours. Impaired judgment for several hours. Difficulty with balance. Vomiting if you eat too soon. Follow these instructions at home: For the time period you were told by your health care provider:     Rest. Do not participate in activities where you could fall or become injured. Do not drive or use machinery. Do not drink alcohol. Do not take sleeping pills or medicines that cause drowsiness. Do not make important decisions or sign legal documents. Do not take care of children on your own. Eating and drinking  Follow the diet recommended by your health care provider. Drink enough fluid to keep your urine pale yellow. If you vomit: Drink water, juice, or soup when you can drink without vomiting. Make sure you have little or no nausea before eating solid foods. General instructions Take over-the-counter and prescription medicines only as told by your health care provider. Have a responsible adult stay with you for the time you are told. It is important to have someone help care for you until you are awake and alert. Do not smoke. Keep all follow-up visits as told by your health care provider. This is important. Contact a health care provider if: You are still sleepy or having trouble with balance after 24 hours. You feel light-headed. You keep feeling nauseous or you keep vomiting. You develop a rash. You have a fever. You have redness or swelling around the IV site. Get help right away if: You have trouble breathing. You have new-onset confusion at home. Summary After the procedure, it is common to  feel sleepy, have impaired judgment, or feel nauseous if you eat too soon. Rest after you get home. Know the things you should not do after the procedure. Follow the diet recommended by your health care provider and drink enough fluid to keep your urine pale yellow. Get help right away if you have trouble breathing or new-onset confusion at home. This information is not intended to replace advice given to you by your health care provider. Make sure you discuss any questions you have with your health care provider. Document Revised: 02/01/2020 Document Reviewed: 08/30/2019 Elsevier Patient Education  2023 Elsevier Inc.     Liver Biopsy, Care After After a liver biopsy, it is common to have these things in the area where the biopsy was done. You may: Have pain. Feel sore. Have bruising. You may also feel tired for a few days. Follow these instructions at home: Medicines Take over-the-counter and prescription medicines only as told by your doctor. If you were prescribed an antibiotic medicine, take it as told by your doctor. Do not stop taking the antibiotic, even if you start to feel better. Do not take medicines that may thin your blood. These medicines include aspirin and ibuprofen. Take them only if your doctor tells you to. If told, take steps to prevent problems with pooping (constipation). You may need to: Drink enough fluid to keep your pee (urine) pale yellow. Take medicines. You will be told what medicines to take. Eat foods that are high in fiber. These include beans, whole grains, and fresh fruits and vegetables. Limit foods that are high in fat and   sugar. These include fried or sweet foods. Ask your doctor if you should avoid driving or using machines while you are taking your medicine. Caring for your incision Follow instructions from your doctor about how to take care of your cut from surgery (incisions). Make sure you: Wash your hands with soap and water for at least 20  seconds before and after you change your bandage. If you cannot use soap and water, use hand sanitizer. Change your bandage. Leavestitches or skin glue in place for at least two weeks. Leave tape strips alone unless you are told to take them off. You may trim the edges of the tape strips if they curl up. Check your incision every day for signs of infection. Check for: Redness, swelling, or more pain. Fluid or blood. Warmth. Pus or a bad smell. Do not take baths, swim, or use a hot tub. Ask your doctor about taking showers or sponge baths. Activity Rest at home for 1-2 days, or as told by your doctor. Get up to take short walks every 1 to 2 hours. Ask for help if you feel weak or unsteady. Do not lift anything that is heavier than 10 lb (4.5 kg), or the limit that you are told. Do not play contact sports for 2 weeks after the procedure. Return to your normal activities as told by your doctor. Ask what activities are safe for you. General instructions Do not drink alcohol in the first week after the procedure. Plan to have a responsible adult care for you for the time you are told after you leave the hospital or clinic. This is important. It is up to you to get the results of your procedure. Ask how to get your results when they are ready. Keep all follow-up visits.   Contact a doctor if: You have more bleeding in your incision. Your incision swells, or is red and more painful. You have fluid that comes from your incision. You develop a rash. You have fever or chills. Get help right away if: You have swelling, bloating, or pain in your belly (abdomen). You get dizzy or faint. You vomit or you feel like vomiting. You have trouble breathing or feel short of breath. You have chest pain. You have problems talking or seeing. You have trouble with your balance or moving your arms or legs. These symptoms may be an emergency. Get help right away. Call your local emergency services (911 in  the [place] Do not wait to see if the symptoms will go away. Do not drive yourself to the hospital. Summary After the procedure, it is common to have pain, soreness, bruising, and tiredness. Your doctor will tell you how to take care of yourself at home. Change your bandage, take your medicines, and limit your activities as told by your doctor. Call your doctor if you have symptoms of infection. Get help right away if your belly swells, your cut bleeds a lot, or you have trouble talking or breathing. This information is not intended to replace advice given to you by your health care provider. Make sure you discuss any questions you have with your healthcare provider. Document Revised: 08/18/2020 Document Reviewed: 08/18/2020 Elsevier Patient Education  2022 Elsevier Inc.     

## 2023-01-13 NOTE — Procedures (Signed)
Interventional Radiology Procedure Note  Procedure: [place] RT LIVER MET CORE BX    Complications: None  Estimated Blood Loss:  MIN  Findings: 18G CORE X 2    M. Daryll Brod, MD

## 2023-01-14 ENCOUNTER — Telehealth: Payer: Self-pay

## 2023-01-14 NOTE — Telephone Encounter (Signed)
Called Pt regarding MyChart message. Assured Pt that her liver bx results are not back yet and that we will keep her 04/05 appt as is for now since she has a PET on 04/01. Explained that we need a few days after the imaging to have results back before the MD visit. Pt verbalized understanding.

## 2023-01-17 ENCOUNTER — Encounter (HOSPITAL_COMMUNITY)
Admission: RE | Admit: 2023-01-17 | Discharge: 2023-01-17 | Disposition: A | Discharge status: Home / Self Care | Payer: 59 | Source: Ambulatory Visit | Attending: Hematology and Oncology | Admitting: Hematology and Oncology

## 2023-01-17 DIAGNOSIS — C50919 Malignant neoplasm of unspecified site of unspecified female breast: Secondary | ICD-10-CM | POA: Diagnosis not present

## 2023-01-17 DIAGNOSIS — C787 Secondary malignant neoplasm of liver and intrahepatic bile duct: Secondary | ICD-10-CM | POA: Insufficient documentation

## 2023-01-17 DIAGNOSIS — C50411 Malignant neoplasm of upper-outer quadrant of right female breast: Secondary | ICD-10-CM | POA: Diagnosis not present

## 2023-01-17 DIAGNOSIS — Z171 Estrogen receptor negative status [ER-]: Secondary | ICD-10-CM

## 2023-01-17 DIAGNOSIS — C50111 Malignant neoplasm of central portion of right female breast: Secondary | ICD-10-CM | POA: Insufficient documentation

## 2023-01-17 LAB — GLUCOSE, CAPILLARY: Glucose-Capillary: 94 mg/dL (ref 70–99)

## 2023-01-17 MED ORDER — FLUDEOXYGLUCOSE F - 18 (FDG) INJECTION
7.4100 | Freq: Once | INTRAVENOUS | Status: AC | PRN
  Administered 2023-01-17: 7.4 via INTRAVENOUS

## 2023-01-21 ENCOUNTER — Inpatient Hospital Stay: Payer: 59 | Attending: Hematology and Oncology | Admitting: Hematology and Oncology

## 2023-01-21 ENCOUNTER — Encounter: Payer: Self-pay | Admitting: *Deleted

## 2023-01-21 ENCOUNTER — Ambulatory Visit: Payer: Self-pay | Admitting: Surgery

## 2023-01-21 VITALS — BP 115/76 | HR 79 | Temp 97.9°F | Resp 18 | Ht 63.0 in | Wt 150.8 lb

## 2023-01-21 DIAGNOSIS — Z171 Estrogen receptor negative status [ER-]: Secondary | ICD-10-CM | POA: Diagnosis not present

## 2023-01-21 DIAGNOSIS — C7801 Secondary malignant neoplasm of right lung: Secondary | ICD-10-CM | POA: Diagnosis not present

## 2023-01-21 DIAGNOSIS — C50111 Malignant neoplasm of central portion of right female breast: Secondary | ICD-10-CM | POA: Insufficient documentation

## 2023-01-21 DIAGNOSIS — R1030 Lower abdominal pain, unspecified: Secondary | ICD-10-CM | POA: Insufficient documentation

## 2023-01-21 DIAGNOSIS — Z79899 Other long term (current) drug therapy: Secondary | ICD-10-CM | POA: Diagnosis not present

## 2023-01-21 DIAGNOSIS — Z9221 Personal history of antineoplastic chemotherapy: Secondary | ICD-10-CM | POA: Insufficient documentation

## 2023-01-21 DIAGNOSIS — Z5111 Encounter for antineoplastic chemotherapy: Secondary | ICD-10-CM | POA: Insufficient documentation

## 2023-01-21 DIAGNOSIS — R1013 Epigastric pain: Secondary | ICD-10-CM | POA: Insufficient documentation

## 2023-01-21 DIAGNOSIS — C787 Secondary malignant neoplasm of liver and intrahepatic bile duct: Secondary | ICD-10-CM | POA: Insufficient documentation

## 2023-01-21 DIAGNOSIS — I427 Cardiomyopathy due to drug and external agent: Secondary | ICD-10-CM | POA: Diagnosis not present

## 2023-01-21 DIAGNOSIS — Z85828 Personal history of other malignant neoplasm of skin: Secondary | ICD-10-CM | POA: Diagnosis not present

## 2023-01-21 DIAGNOSIS — M7989 Other specified soft tissue disorders: Secondary | ICD-10-CM | POA: Diagnosis not present

## 2023-01-21 DIAGNOSIS — Z808 Family history of malignant neoplasm of other organs or systems: Secondary | ICD-10-CM | POA: Insufficient documentation

## 2023-01-21 DIAGNOSIS — Z800 Family history of malignant neoplasm of digestive organs: Secondary | ICD-10-CM | POA: Diagnosis not present

## 2023-01-21 DIAGNOSIS — Z923 Personal history of irradiation: Secondary | ICD-10-CM | POA: Diagnosis not present

## 2023-01-21 DIAGNOSIS — Z87891 Personal history of nicotine dependence: Secondary | ICD-10-CM | POA: Insufficient documentation

## 2023-01-21 DIAGNOSIS — R140 Abdominal distension (gaseous): Secondary | ICD-10-CM | POA: Diagnosis not present

## 2023-01-21 NOTE — Assessment & Plan Note (Signed)
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  Adjuvant chemotherapy with Taxol and Herceptin followed by Herceptin maintenance completed 07/06/2022 2. breast radiation completed 01/04/2022 3.  Metastatic disease diagnosis: --------------------------------------------------------------------------------------------------------------------- CT chest on 11/01/2022: Right upper lobe nodules largest measuring 5 mm CT chest 12/29/2022: Upper and mid lung zone predominant pulmonary nodules are worrisome for metastatic disease.  Left and right hepatic lobe masses (2.5 cm and 3.9 cm) are new and are worrisome for metastatic disease. PET CT scan 01/17/2023: Hypermetabolic bilobar hepatic (2 masses measuring 2.8 cm SUV 11.1, another mass 4.1 cm SUV 10.3) and pulmonary metastases (right upper lobe lung nodule 5 mm SUV 2.6 right lower lobe 6 mm nodule SUV 1.8)  01/13/23: Liver Biopsy: Met breast cancer

## 2023-01-21 NOTE — Progress Notes (Signed)
RN placed call [facility] and spoke with Bjorn Loser, requesting breast prognostic panel be added to recent path report from 01/13/23.  Bjorn Loser states she will contact [facility] to have panel added.

## 2023-01-24 ENCOUNTER — Telehealth: Payer: Self-pay | Admitting: Hematology and Oncology

## 2023-01-24 ENCOUNTER — Encounter: Payer: Self-pay | Admitting: Hematology and Oncology

## 2023-01-24 ENCOUNTER — Other Ambulatory Visit: Payer: Self-pay | Admitting: Hematology and Oncology

## 2023-01-24 ENCOUNTER — Ambulatory Visit (HOSPITAL_COMMUNITY): Payer: 59 | Attending: Cardiology

## 2023-01-24 ENCOUNTER — Encounter: Payer: Self-pay | Admitting: *Deleted

## 2023-01-24 DIAGNOSIS — I427 Cardiomyopathy due to drug and external agent: Secondary | ICD-10-CM | POA: Insufficient documentation

## 2023-01-24 DIAGNOSIS — Z171 Estrogen receptor negative status [ER-]: Secondary | ICD-10-CM | POA: Diagnosis not present

## 2023-01-24 DIAGNOSIS — C50111 Malignant neoplasm of central portion of right female breast: Secondary | ICD-10-CM | POA: Diagnosis not present

## 2023-01-24 LAB — SURGICAL PATHOLOGY

## 2023-01-24 LAB — ECHOCARDIOGRAM COMPLETE
Area-P 1/2: 4.33 cm2
S' Lateral: 2.5 cm

## 2023-01-24 MED ORDER — PROCHLORPERAZINE MALEATE 10 MG PO TABS
10.0000 mg | ORAL_TABLET | Freq: Four times a day (QID) | ORAL | 1 refills | Status: DC | PRN
Start: 2023-01-24 — End: 2023-10-04

## 2023-01-24 MED ORDER — ONDANSETRON HCL 8 MG PO TABS
8.0000 mg | ORAL_TABLET | Freq: Three times a day (TID) | ORAL | 1 refills | Status: DC | PRN
Start: 2023-01-24 — End: 2023-10-04

## 2023-01-24 MED ORDER — LIDOCAINE-PRILOCAINE 2.5-2.5 % EX CREA
TOPICAL_CREAM | CUTANEOUS | 3 refills | Status: DC
Start: 2023-01-24 — End: 2023-10-04

## 2023-01-24 NOTE — Progress Notes (Signed)
Per MD request RN successfully faxed [facility] request to [phone].

## 2023-01-24 NOTE — Telephone Encounter (Signed)
No need for a FU appointment at this time per 4/5 los. Per Dr.Gudena.

## 2023-01-24 NOTE — Progress Notes (Signed)
DISCONTINUE ON PATHWAY REGIMEN - Breast     Cycle 1: A cycle is 7 days:     Trastuzumab-xxxx      Paclitaxel    Cycles 2 through 12: A cycle is every 7 days:     Trastuzumab-xxxx      Paclitaxel    Cycles 13 through 25: A cycle is every 21 days:     Trastuzumab-xxxx   **Always confirm dose/schedule in your pharmacy ordering system**  REASON: Other Reason PRIOR TREATMENT: [id number]: Weekly Paclitaxel + Trastuzumab x 12 Weeks, Followed by Trastuzumab Maintenance q21 Days x 13 Cycles TREATMENT RESPONSE: Unable to Evaluate  START ON PATHWAY REGIMEN - Breast     A cycle is every 21 days:     Fam-trastuzumab deruxtecan-nxki   **Always confirm dose/schedule in your pharmacy ordering system**  Patient Characteristics: Distant Metastases or Locoregional Recurrent Disease - Unresected, M0 or Locally Advanced Unresectable Disease Progressing after Neoadjuvant and Local Therapies, M0, HER2 Positive, ER Negative, Chemotherapy, Second Line Therapeutic Status: Distant Metastases HER2 Status: Positive (+) ER Status: Negative (-) PR Status: Negative (-) Line of Therapy: Second Line Intent of Therapy: Non-Curative / Palliative Intent, Discussed with Patient

## 2023-01-24 NOTE — Telephone Encounter (Signed)
Scheduled appointments per WQ. Left voicemail. 

## 2023-01-25 ENCOUNTER — Other Ambulatory Visit: Payer: Self-pay | Admitting: *Deleted

## 2023-01-25 ENCOUNTER — Telehealth: Payer: Self-pay | Admitting: Hematology and Oncology

## 2023-01-25 DIAGNOSIS — Z171 Estrogen receptor negative status [ER-]: Secondary | ICD-10-CM

## 2023-01-25 NOTE — Progress Notes (Signed)
Per MD request, orders placed for port a cath insertion.

## 2023-01-25 NOTE — Telephone Encounter (Signed)
Rescheduled appointments per nurse Adelina Mings. Port placement on 4/17. Left voicemail.

## 2023-01-27 ENCOUNTER — Telehealth: Payer: Self-pay | Admitting: Hematology and Oncology

## 2023-01-27 ENCOUNTER — Telehealth: Payer: Self-pay

## 2023-01-27 NOTE — Telephone Encounter (Signed)
Called Pt regarding education appt. Pt states she does not want to come in person for education as this is "not her first rodeo with cancer", she [place] min away, and states "coming in extra would be more harmful on my mental health". Sent scheduling message for Pt to have virtual education. Pt verbalized understanding.

## 2023-01-27 NOTE — Telephone Encounter (Signed)
Spoke with patient confirming upcoming appointments  

## 2023-02-01 ENCOUNTER — Other Ambulatory Visit: Payer: Self-pay | Admitting: Student

## 2023-02-01 ENCOUNTER — Other Ambulatory Visit: Payer: Self-pay | Admitting: Radiology

## 2023-02-01 ENCOUNTER — Ambulatory Visit: Payer: Self-pay | Admitting: Surgery

## 2023-02-02 ENCOUNTER — Ambulatory Visit (HOSPITAL_COMMUNITY)
Admission: RE | Admit: 2023-02-02 | Discharge: 2023-02-02 | Disposition: A | Discharge status: Home / Self Care | Payer: 59 | Source: Ambulatory Visit | Attending: Hematology and Oncology | Admitting: Hematology and Oncology

## 2023-02-02 ENCOUNTER — Encounter (HOSPITAL_COMMUNITY): Payer: Self-pay

## 2023-02-02 ENCOUNTER — Ambulatory Visit: Payer: 59

## 2023-02-02 ENCOUNTER — Other Ambulatory Visit: Payer: 59

## 2023-02-02 ENCOUNTER — Ambulatory Visit: Payer: 59 | Admitting: Adult Health

## 2023-02-02 DIAGNOSIS — Z85828 Personal history of other malignant neoplasm of skin: Secondary | ICD-10-CM | POA: Insufficient documentation

## 2023-02-02 DIAGNOSIS — F419 Anxiety disorder, unspecified: Secondary | ICD-10-CM | POA: Diagnosis not present

## 2023-02-02 DIAGNOSIS — C787 Secondary malignant neoplasm of liver and intrahepatic bile duct: Secondary | ICD-10-CM | POA: Insufficient documentation

## 2023-02-02 DIAGNOSIS — Z171 Estrogen receptor negative status [ER-]: Secondary | ICD-10-CM | POA: Diagnosis not present

## 2023-02-02 DIAGNOSIS — Z923 Personal history of irradiation: Secondary | ICD-10-CM | POA: Diagnosis not present

## 2023-02-02 DIAGNOSIS — K219 Gastro-esophageal reflux disease without esophagitis: Secondary | ICD-10-CM | POA: Diagnosis not present

## 2023-02-02 DIAGNOSIS — F32A Depression, unspecified: Secondary | ICD-10-CM | POA: Diagnosis not present

## 2023-02-02 DIAGNOSIS — Z9221 Personal history of antineoplastic chemotherapy: Secondary | ICD-10-CM | POA: Diagnosis not present

## 2023-02-02 DIAGNOSIS — Z452 Encounter for adjustment and management of vascular access device: Secondary | ICD-10-CM | POA: Diagnosis not present

## 2023-02-02 DIAGNOSIS — C50111 Malignant neoplasm of central portion of right female breast: Secondary | ICD-10-CM | POA: Insufficient documentation

## 2023-02-02 DIAGNOSIS — C50919 Malignant neoplasm of unspecified site of unspecified female breast: Secondary | ICD-10-CM | POA: Diagnosis not present

## 2023-02-02 HISTORY — PX: IR IMAGING GUIDED PORT INSERTION: IMG5740

## 2023-02-02 MED ORDER — FENTANYL CITRATE (PF) 100 MCG/2ML IJ SOLN
INTRAMUSCULAR | Status: AC | PRN
  Administered 2023-02-02: 50 ug via INTRAVENOUS
  Administered 2023-02-02: 25 ug via INTRAVENOUS
  Administered 2023-02-02: 50 ug via INTRAVENOUS
  Administered 2023-02-02: 25 ug via INTRAVENOUS

## 2023-02-02 MED ORDER — HEPARIN SOD (PORK) LOCK FLUSH 100 UNIT/ML IV SOLN
INTRAVENOUS | Status: AC | PRN
  Administered 2023-02-02: 500 [IU] via INTRAVENOUS

## 2023-02-02 MED ORDER — MIDAZOLAM HCL 2 MG/2ML IJ SOLN
INTRAMUSCULAR | Status: AC
  Filled 2023-02-02: qty 2

## 2023-02-02 MED ORDER — FENTANYL CITRATE (PF) 100 MCG/2ML IJ SOLN
INTRAMUSCULAR | Status: AC
  Filled 2023-02-02: qty 2

## 2023-02-02 MED ORDER — MIDAZOLAM HCL 2 MG/2ML IJ SOLN
INTRAMUSCULAR | Status: AC | PRN
  Administered 2023-02-02: 1 mg via INTRAVENOUS
  Administered 2023-02-02 (×2): .5 mg via INTRAVENOUS
  Administered 2023-02-02: 1 mg via INTRAVENOUS

## 2023-02-02 MED ORDER — SODIUM CHLORIDE 0.9 % IV SOLN: INTRAVENOUS | Status: DC

## 2023-02-02 MED ORDER — HEPARIN SOD (PORK) LOCK FLUSH 100 UNIT/ML IV SOLN
INTRAVENOUS | Status: AC
  Filled 2023-02-02: qty 5

## 2023-02-02 MED ORDER — LIDOCAINE-EPINEPHRINE 1 %-1:100000 IJ SOLN
20.0000 mL | Freq: Once | INTRAMUSCULAR | Status: AC
  Administered 2023-02-02: 15 mL via INTRADERMAL

## 2023-02-02 MED ORDER — LIDOCAINE-EPINEPHRINE 1 %-1:100000 IJ SOLN
INTRAMUSCULAR | Status: AC
  Filled 2023-02-02: qty 1

## 2023-02-02 MED ORDER — LIDOCAINE HCL 1 % IJ SOLN
20.0000 mL | Freq: Once | INTRAMUSCULAR | Status: AC
  Administered 2023-02-02: 10 mL via INTRADERMAL

## 2023-02-02 MED ORDER — LIDOCAINE HCL 1 % IJ SOLN
INTRAMUSCULAR | Status: AC
  Filled 2023-02-02: qty 20

## 2023-02-02 NOTE — H&P (Signed)
Referring Physician(s): Serena Croissant  Supervising Physician: Richarda Overlie  Patient Status:  [facility]  Chief Complaint:  "I'm getting a port a cath"  Subjective: Pt known to IR team from right liver lesion biopsy on 01/13/23. She is a 62 yo female ex smoker, with PMH sig for anxiety/depression, atypical meningioma of brain with resection 2019, GERD, basal cell skin cancer, migraines, seizures, and right breast cancer in 2013 and 2022, s/p lumpectomy/chemoradiation . Pathology from recent liver bx revealed metastatic breast cancer. She presents today for port a cath placement to assist with treatment. She previously had a right chest port placed by Dr. Luisa Hart in 2022, removed in 2023. She denies fever, HA,CP,dyspnea, cough, abd pain, back pain,N/V or bleeding.    Past Medical History:  Diagnosis Date   Allergy    SEASONAL   Anxiety    occ lorazepam   Atypical meningioma of brain 2018/2019   Resected, then RT Feb/Mar 2019.  No sign of residual dz at 04/2018 rad onc f/u and 10/2018 neuro-onc f/u. 03/2019 MRI brain->no resid/no recurrence.   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   Chicken pox    Depression    zoloft in past--?wt gain.   Diplopia    chronic (meningioma-related)   Eustachian tube dysfunction 09/05/2013   Family history of breast cancer    Family history of colon cancer    Family history of melanoma    Family history of ovarian cancer    Family history of pancreatic cancer    GERD (gastroesophageal reflux disease)    History of radiation therapy 02/24/12-04/11/12   right breast/ 45Gy@1 .8Gyx58fx/boost=16Gy@2  [id number].  Latest mammo and u/s 03/2013--normal.   History of radiation therapy 11/30/17- 01/11/18   Right temporal lobe treated to 55.8 Gy with 31 fx of 1.8 Gy   Hx of basal cell carcinoma    Migraine    Palpitations    has taken Metoprolol  for palpitations in the past.   Seizure    2 YEARS AGO,UPDATED 09/07/22   Taste impairment    s/p radiation therapy   Tobacco dependence 09/05/2013   Past Surgical History:  Procedure Laterality Date   APPENDECTOMY  2008   emergency   BRAIN TUMOR EXCISION  2018   Meningioma   BREAST LUMPECTOMY Right 01/04/2012   right breast   BREAST LUMPECTOMY WITH RADIOACTIVE SEED AND SENTINEL LYMPH NODE BIOPSY Right 06/18/2021   Procedure: RIGHT BREAST LUMPECTOMY WITH RADIOACTIVE SEED AND SENTINEL LYMPH NODE BIOPSY;  Surgeon: Harriette Bouillon, MD;  Location: [facility];  Service: General;  Laterality: Right;   COLONOSCOPY     2014 ?   CRANIECTOMY  10/19/2017   at [facility] hospital   PORTACATH PLACEMENT Right 06/18/2021   Procedure: INSERTION PORT-A-CATH;  Surgeon: Harriette Bouillon, MD;  Location: [facility];  Service: General;  Laterality: Right;   PORTACATH REMOVED     OCTOBER 10,2023   WISDOM TOOTH EXTRACTION  1990      Allergies: Patient has no known allergies.  Medications: Prior to Admission medications   Medication Sig Start Date End Date Taking? Authorizing Provider  acetaminophen (TYLENOL) 500 MG tablet Take 500 mg by mouth as needed.   Yes [provider]  ALPRAZolam (XANAX) 0.25 MG tablet TAKE 1 TABLET BY MOUTH 2 TIMES DAILY AS NEEDED FOR ANXIETY. 09/02/22  Yes Gudena,  Mikey College, MD  Famotidine-Ca Carb-Mag Hydrox (PEPCID COMPLETE PO) Take by mouth as needed.   Yes [provider]  diazepam (VALIUM) 2 MG tablet Take by mouth as needed. 09/04/22   [provider]  lidocaine-prilocaine (EMLA) cream Apply to affected area once 01/24/23   Serena Croissant, MD  ondansetron (ZOFRAN) 8 MG tablet Take 1 tablet (8 mg total) by mouth every 8 (eight) hours as needed for nausea or vomiting. Start on the third day after chemotherapy. 01/24/23   Serena Croissant, MD  prochlorperazine (COMPAZINE) 10 MG tablet Take 1 tablet (10 mg total) by mouth every 6 (six)  hours as needed for nausea or vomiting. 01/24/23   Serena Croissant, MD     Vital Signs: BP 133/80   Pulse 79   Temp 98.1 F (36.7 C) (Oral)   Resp 16   Ht  (1.6 m)   Wt 150 lb 12.8 oz (68.4 kg)   LMP 05/11/2013   SpO2 96%   BMI 26.71 kg/m    Code Status: FULL CODE  Physical Exam: awake/alert; chest- CTA bilat; heart- RRR; abd- soft,+BS,NT; no LE edema  Imaging: No results found.  Labs:  CBC: Recent Labs    04/13/22 0855 05/25/22 0904 07/06/22 0955 01/13/23 1200  WBC 7.9 6.4 6.4 7.0  HGB 11.6* 11.6* 11.8* 12.4  HCT 35.3* 35.5* 36.0 39.1  PLT 270 277 282 275    COAGS: Recent Labs    01/13/23 1200  INR 1.0    BMP: Recent Labs    04/13/22 0855 05/25/22 0904 07/06/22 0955 01/13/23 1200  NA 137 136 138 138  K 3.8 4.3 4.3 3.8  CL 107 106 107 106  CO2 GLUCOSE 95 97 94 87  BUN CALCIUM 9.3 8.8* 8.8* 8.7*  CREATININE 0.78 0.68 0.71 0.63  GFRNONAA >60 >60 >60 >60    LIVER FUNCTION TESTS: Recent Labs    04/13/22 0855 05/25/22 0904 07/06/22 0955 01/13/23 1200  BILITOT 0.3 0.3 0.3 0.7  AST 15 12* 13* 21  ALT ALKPHOS 63 72 69 76  PROT 6.7 7.0 6.8 6.8  ALBUMIN 4.2 4.2 4.3 4.1    Assessment and Plan: 62 yo female ex smoker, with PMH sig for anxiety/depression, atypical meningioma of brain with resection 2019, GERD, basal cell skin cancer, migraines, seizures, and right breast cancer in 2013 and 2022, s/p lumpectomy/chemoradiation . Pathology from recent liver bx revealed metastatic breast cancer. She presents today for port a cath placement to assist with treatment. She previously had a right chest port placed by Dr. Luisa Hart in 2022, removed in 2023.Risks and benefits of image guided port-a-catheter placement was discussed with the patient including, but not limited to bleeding, infection, pneumothorax, or fibrin sheath development and need for additional procedures.  All of the patient's questions were answered,  patient is agreeable to proceed. Consent signed and in chart.    Electronically Signed: D. Jeananne Rama, PA-C 02/02/2023, 12:50 PM   I spent a total of 20 minutes at the the patient's bedside AND on the patient's hospital floor or unit, greater than 50% of which was counseling/coordinating care for port a cath placement

## 2023-02-02 NOTE — Discharge Instructions (Signed)

## 2023-02-02 NOTE — Procedures (Signed)
Interventional Radiology Procedure:   Indications: Metastatic breast cancer  Procedure: Port placement  Findings: Right jugular port, tip at SVC/RA junction  Complications: None     EBL: Minimal, less than 10 ml  Plan: Discharge in one hour.  Keep port site and incisions dry for at least 24 hours.     Nancy Arvin R. Ronnesha Mester, MD  Pager: [phone]    

## 2023-02-03 ENCOUNTER — Inpatient Hospital Stay: Payer: 59 | Admitting: Pharmacist

## 2023-02-03 ENCOUNTER — Telehealth: Payer: Self-pay | Admitting: Pharmacist

## 2023-02-03 ENCOUNTER — Inpatient Hospital Stay: Payer: 59

## 2023-02-03 DIAGNOSIS — Z171 Estrogen receptor negative status [ER-]: Secondary | ICD-10-CM

## 2023-02-03 NOTE — Telephone Encounter (Signed)
Called and reminded patient about virtual mychart appointment with pharmacy clinic and patient education. Left voicemail.

## 2023-02-03 NOTE — Progress Notes (Signed)
[facility]       Telephone: [phone]?Fax: [phone]   Oncology Clinical Pharmacist Practitioner Initial Assessment  Lori Mcmillan is a 62 y.o. female with a diagnosis of breast cancer. They were contacted today via video visit.  I connected with Lori Mcmillan today by video and verified that I was speaking with the correct person using two patient identifiers.   I discussed the limitations, risks, security and privacy concerns of performing an evaluation and management service by telemedicine and the availability of in-person appointments. I also discussed with the patient that there may be a patient responsible charge related to this service. The patient/caregiver expressed understanding and agreed to proceed.   Other persons participating in the visit and their role in the encounter: none   Patient's location: home  Provider's location: clinic    Indication/Regimen Fam-trastuzumab deruxtecan-nxki  (Enhertu) is being used appropriately for treatment of breast cancer by Dr. Serena Croissant.       Wt Readings from Last 1 Encounters:  02/02/23 150 lb 12.8 oz (68.4 kg)    Estimated body surface area is 1.74 meters squared as calculated from the following:   Height as of 02/02/23:  (1.6 m).   Weight as of 02/02/23: 150 lb 12.8 oz (68.4 kg).  The dosing regimen is every 21 days until disease progression or unacceptable toxicity  Fam-trastuzumab deruxtecan-nxki (5.4 mg/kg) on Day 1  Dose Modifications Dexamethasone removed as prescription by Dr. Pamelia Hoit. Full dose chemotherapy.   Allergies No Known Allergies  Vitals = No vitals or labs were done today for this chemotherapy education visit. This was also a virtual visit  Contraindications Contraindications were reviewed? Yes Contraindications to therapy were identified? No   Safety Precautions The following safety precautions for the use of fam-trastuzumab deruxtecan-nxki were reviewed:   Fever/neutropenia: reviewed the importance of having a thermometer and the Centers for Disease Control and Prevention (CDC) definition of fever which is 100.59F (38C) or higher. Patient should call 24/7 triage at [phone] if experiencing a fever or any other symptoms Decreased hemoglobin Decreased platelet count Nausea / Vomiting Liver function test elevations Fatigue Alopecia Electrolyte abnormalities Constipation Diarrhea Pain Decreased appetite Interstitial Lung Disease (ILD): reviewed possible symptoms which include cough, shortness of breast, or fever Left Ventricular Dysfunction Handling body fluids and waste Intimacy, sexual activity, contraception, and fertility  Medication Reconciliation Current Outpatient Medications  Medication Sig Dispense Refill   acetaminophen (TYLENOL) 500 MG tablet Take 500 mg by mouth as needed.     ALPRAZolam (XANAX) 0.25 MG tablet TAKE 1 TABLET BY MOUTH 2 TIMES DAILY AS NEEDED FOR ANXIETY. 30 tablet 3   diazepam (VALIUM) 2 MG tablet Take by mouth as needed.     Famotidine-Ca Carb-Mag Hydrox (PEPCID COMPLETE PO) Take by mouth as needed.     lidocaine-prilocaine (EMLA) cream Apply to affected area once 30 g 3   ondansetron (ZOFRAN) 8 MG tablet Take 1 tablet (8 mg total) by mouth every 8 (eight) hours as needed for nausea or vomiting. Start on the third day after chemotherapy. 30 tablet 1   prochlorperazine (COMPAZINE) 10 MG tablet Take 1 tablet (10 mg total) by mouth every 6 (six) hours as needed for nausea or vomiting. 30 tablet 1   No current facility-administered medications for this visit.   Medication reconciliation is based on the patient's most recent medication list in the electronic medical record (EMR) including herbal products and OTC medications.   The patient's  medication list was reviewed today with the patient? Yes   Drug-drug interactions (DDIs) DDIs were evaluated? Yes Significant DDIs identified? Yes , she does have two  BZDs on medication list. However, she states she only takes the diazepam when experiencing dizziness. She knows not to take them at the same time  Drug-Food Interactions Drug-food interactions were evaluated? Yes Drug-food interactions identified? No   Follow-up Plan  Treatment start date: 02/07/23 Port placement date: 02/02/23 ECHO date: 01/24/23 We reviewed the prescriptions, premedications, and treatment regimen with the patient. Possible side effects of the treatment regimen were reviewed and management strategies were discussed.  She will use OTC loperamide (Imodium) if needed for loose stools Clinical pharmacy will assist Dr. Serena Croissant and Lori Mcmillan on an as needed basis going forward  Lori Mcmillan participated in the discussion, expressed understanding, and voiced agreement with the above plan. All questions were answered to her satisfaction. The patient was advised to contact the clinic at [phone] with any questions or concerns prior to her return visit.   I spent 30 minutes assessing the patient.  Ismeal Heider A. Odetta Pink, PharmD, BCOP, CPP  Anselm Lis, RPH-CPP, 02/03/2023 11:01 AM  **Disclaimer: This note was dictated with voice recognition software. Similar sounding words can inadvertently be transcribed and this note may contain transcription errors which may not have been corrected upon publication of note.**

## 2023-02-04 ENCOUNTER — Telehealth: Payer: Self-pay | Admitting: Internal Medicine

## 2023-02-04 MED FILL — Fosaprepitant Dimeglumine For IV Infusion 150 MG (Base Eq): INTRAVENOUS | Qty: 5 | Status: AC

## 2023-02-04 MED FILL — Dexamethasone Sodium Phosphate Inj 100 MG/10ML: INTRAMUSCULAR | Qty: 1 | Status: AC

## 2023-02-04 NOTE — Progress Notes (Addendum)
Pt called and states that her port site, placed 4/17, is slightly red with warmth at the port site.  Pt denies fever, chills, pain, redness, excessive warmth at site or oozing. Pt was advised that there will be some redness and warmth at incision site being only 2 days post-op.  She was advised to monitor for increased warmth, swelling redness, pain or oozing at the site.  Pt verbalized understanding and will call back if she experiences new symptoms.      Alex Gardener, AGNP-BC 02/04/2023, 4:37 PM

## 2023-02-07 ENCOUNTER — Inpatient Hospital Stay: Payer: 59

## 2023-02-07 ENCOUNTER — Ambulatory Visit (HOSPITAL_COMMUNITY)
Admission: RE | Admit: 2023-02-07 | Discharge: 2023-02-07 | Disposition: A | Discharge status: Home / Self Care | Payer: 59 | Source: Ambulatory Visit | Attending: Adult Health | Admitting: Adult Health

## 2023-02-07 ENCOUNTER — Inpatient Hospital Stay (HOSPITAL_BASED_OUTPATIENT_CLINIC_OR_DEPARTMENT_OTHER): Payer: 59 | Admitting: Adult Health

## 2023-02-07 ENCOUNTER — Ambulatory Visit (HOSPITAL_COMMUNITY): Payer: 59

## 2023-02-07 ENCOUNTER — Encounter: Payer: Self-pay | Admitting: Adult Health

## 2023-02-07 VITALS — BP 135/69 | HR 78 | Temp 98.2°F | Resp 16 | Ht 63.0 in | Wt 152.0 lb

## 2023-02-07 VITALS — BP 105/64 | HR 77 | Resp 18

## 2023-02-07 DIAGNOSIS — C7801 Secondary malignant neoplasm of right lung: Secondary | ICD-10-CM

## 2023-02-07 DIAGNOSIS — R140 Abdominal distension (gaseous): Secondary | ICD-10-CM | POA: Insufficient documentation

## 2023-02-07 DIAGNOSIS — Z800 Family history of malignant neoplasm of digestive organs: Secondary | ICD-10-CM | POA: Diagnosis not present

## 2023-02-07 DIAGNOSIS — Z85828 Personal history of other malignant neoplasm of skin: Secondary | ICD-10-CM | POA: Diagnosis not present

## 2023-02-07 DIAGNOSIS — C7802 Secondary malignant neoplasm of left lung: Secondary | ICD-10-CM

## 2023-02-07 DIAGNOSIS — C50111 Malignant neoplasm of central portion of right female breast: Secondary | ICD-10-CM

## 2023-02-07 DIAGNOSIS — C787 Secondary malignant neoplasm of liver and intrahepatic bile duct: Secondary | ICD-10-CM | POA: Diagnosis not present

## 2023-02-07 DIAGNOSIS — Z5111 Encounter for antineoplastic chemotherapy: Secondary | ICD-10-CM | POA: Diagnosis not present

## 2023-02-07 DIAGNOSIS — Z9221 Personal history of antineoplastic chemotherapy: Secondary | ICD-10-CM | POA: Diagnosis not present

## 2023-02-07 DIAGNOSIS — Z808 Family history of malignant neoplasm of other organs or systems: Secondary | ICD-10-CM | POA: Diagnosis not present

## 2023-02-07 DIAGNOSIS — Z79899 Other long term (current) drug therapy: Secondary | ICD-10-CM | POA: Diagnosis not present

## 2023-02-07 DIAGNOSIS — R1013 Epigastric pain: Secondary | ICD-10-CM | POA: Diagnosis not present

## 2023-02-07 DIAGNOSIS — Z171 Estrogen receptor negative status [ER-]: Secondary | ICD-10-CM | POA: Diagnosis not present

## 2023-02-07 DIAGNOSIS — R600 Localized edema: Secondary | ICD-10-CM | POA: Diagnosis not present

## 2023-02-07 DIAGNOSIS — M7989 Other specified soft tissue disorders: Secondary | ICD-10-CM | POA: Diagnosis not present

## 2023-02-07 DIAGNOSIS — Z923 Personal history of irradiation: Secondary | ICD-10-CM | POA: Diagnosis not present

## 2023-02-07 DIAGNOSIS — C7800 Secondary malignant neoplasm of unspecified lung: Secondary | ICD-10-CM | POA: Insufficient documentation

## 2023-02-07 DIAGNOSIS — R1030 Lower abdominal pain, unspecified: Secondary | ICD-10-CM | POA: Diagnosis not present

## 2023-02-07 DIAGNOSIS — Z87891 Personal history of nicotine dependence: Secondary | ICD-10-CM | POA: Diagnosis not present

## 2023-02-07 DIAGNOSIS — Z95828 Presence of other vascular implants and grafts: Secondary | ICD-10-CM

## 2023-02-07 LAB — CMP (CANCER CENTER ONLY)
ALT: 30 U/L (ref 0–44)
AST: 28 U/L (ref 15–41)
Albumin: 4.2 g/dL (ref 3.5–5.0)
Alkaline Phosphatase: 91 U/L (ref 38–126)
Anion gap: 6 (ref 5–15)
BUN: 8 mg/dL (ref 6–20)
CO2: 26 mmol/L (ref 22–32)
Calcium: 9.3 mg/dL (ref 8.9–10.3)
Chloride: 106 mmol/L (ref 98–111)
Creatinine: 0.63 mg/dL (ref 0.44–1.00)
GFR, Estimated: >60 mL/min (ref >60)
Glucose, Bld: 90 mg/dL (ref 70–99)
Potassium: 4.3 mmol/L (ref 3.5–5.1)
Sodium: 138 mmol/L (ref 135–145)
Total Bilirubin: 0.3 mg/dL (ref 0.3–1.2)
Total Protein: 7 g/dL (ref 6.5–8.1)

## 2023-02-07 LAB — CBC WITH DIFFERENTIAL (CANCER CENTER ONLY)
Abs Immature Granulocytes: 0.03 10*3/uL (ref 0.00–0.07)
Basophils Absolute: 0.1 10*3/uL (ref 0.0–0.1)
Basophils Relative: 1 %
Eosinophils Absolute: 0.2 10*3/uL (ref 0.0–0.5)
Eosinophils Relative: 3 %
HCT: 37.7 % (ref 36.0–46.0)
Hemoglobin: 12.2 g/dL (ref 12.0–15.0)
Immature Granulocytes: 0 %
Lymphocytes Relative: 25 %
Lymphs Abs: 1.8 10*3/uL (ref 0.7–4.0)
MCH: 29.8 pg (ref 26.0–34.0)
MCHC: 32.4 g/dL (ref 30.0–36.0)
MCV: 92.2 fL (ref 80.0–100.0)
Monocytes Absolute: 0.7 10*3/uL (ref 0.1–1.0)
Monocytes Relative: 10 %
Neutro Abs: 4.4 10*3/uL (ref 1.7–7.7)
Neutrophils Relative %: 61 %
Platelet Count: 261 10*3/uL (ref 150–400)
RBC: 4.09 MIL/uL (ref 3.87–5.11)
RDW: 12.9 % (ref 11.5–15.5)
WBC Count: 7.2 10*3/uL (ref 4.0–10.5)
nRBC: 0 % (ref 0.0–0.2)

## 2023-02-07 MED ORDER — SODIUM CHLORIDE 0.9 % IV SOLN
150.0000 mg | Freq: Once | INTRAVENOUS | Status: AC
  Administered 2023-02-07: 150 mg via INTRAVENOUS
  Filled 2023-02-07: qty 150

## 2023-02-07 MED ORDER — DIPHENHYDRAMINE HCL 25 MG PO CAPS
25.0000 mg | ORAL_CAPSULE | Freq: Once | ORAL | Status: AC
  Administered 2023-02-07: 25 mg via ORAL
  Filled 2023-02-07: qty 1

## 2023-02-07 MED ORDER — SODIUM CHLORIDE 0.9 % IV SOLN
10.0000 mg | Freq: Once | INTRAVENOUS | Status: AC
  Administered 2023-02-07: 10 mg via INTRAVENOUS
  Filled 2023-02-07: qty 10

## 2023-02-07 MED ORDER — FAM-TRASTUZUMAB DERUXTECAN-NXKI CHEMO 100 MG IV SOLR
5.4000 mg/kg | Freq: Once | INTRAVENOUS | Status: AC
  Administered 2023-02-07: 400 mg via INTRAVENOUS
  Filled 2023-02-07: qty 20

## 2023-02-07 MED ORDER — HEPARIN SOD (PORK) LOCK FLUSH 100 UNIT/ML IV SOLN
500.0000 [IU] | Freq: Once | INTRAVENOUS | Status: AC | PRN
  Administered 2023-02-07: 500 [IU]

## 2023-02-07 MED ORDER — SODIUM CHLORIDE 0.9% FLUSH
10.0000 mL | INTRAVENOUS | Status: DC | PRN
  Administered 2023-02-07: 10 mL

## 2023-02-07 MED ORDER — SODIUM CHLORIDE 0.9% FLUSH
10.0000 mL | Freq: Once | INTRAVENOUS | Status: AC
  Administered 2023-02-07: 10 mL

## 2023-02-07 MED ORDER — SODIUM CHLORIDE 0.9% FLUSH: 3.0000 mL | INTRAVENOUS | Status: DC | PRN

## 2023-02-07 MED ORDER — HEPARIN SOD (PORK) LOCK FLUSH 100 UNIT/ML IV SOLN: 250.0000 [IU] | Freq: Once | INTRAVENOUS | Status: DC | PRN

## 2023-02-07 MED ORDER — ALTEPLASE 2 MG IJ SOLR: 2.0000 mg | Freq: Once | INTRAMUSCULAR | Status: DC | PRN

## 2023-02-07 MED ORDER — DEXTROSE 5 % IV SOLN: Freq: Once | INTRAVENOUS | Status: AC

## 2023-02-07 MED ORDER — ACETAMINOPHEN 325 MG PO TABS
650.0000 mg | ORAL_TABLET | Freq: Once | ORAL | Status: AC
  Administered 2023-02-07: 650 mg via ORAL
  Filled 2023-02-07: qty 2

## 2023-02-07 MED ORDER — PALONOSETRON HCL INJECTION 0.25 MG/5ML
0.2500 mg | Freq: Once | INTRAVENOUS | Status: AC
  Administered 2023-02-07: 0.25 mg via INTRAVENOUS
  Filled 2023-02-07: qty 5

## 2023-02-07 NOTE — Progress Notes (Signed)
[facility] Cancer Follow up:    Everrett Coombe, DO [address]  [address] [place] [address]   DIAGNOSIS:  Cancer Staging  Malignant neoplasm of central portion of right breast in female, estrogen receptor negative Staging form: Breast, AJCC 6th Edition - Pathologic stage from 01/04/2012: Stage I (T1c, N0, M0) - Signed by [name], NP on 10/05/2022 Histologic grade (G): G1 Staging form: Breast, AJCC 8th Edition - Pathologic stage from 06/18/2021: Stage IA (pT1c, pN0, cM0, G3, ER-, PR-, HER2+) - Signed by [name], NP on 10/05/2022 Stage prefix: Initial diagnosis Histologic grading system: 3 grade system - Pathologic stage from 01/13/2023: Stage IV (pM1, G3, ER-, PR-, HER2+) - Signed by [name], NP on 02/07/2023 Histologic grading system: 3 grade system   SUMMARY OF ONCOLOGIC HISTORY: Oncology History  Malignant neoplasm of central portion of right breast in female, estrogen receptor negative  01/04/2012 Initial Diagnosis   Right breast cancer: Stage Ia ER/PR positive HER2 negative status postlumpectomy, radiation (low risk Oncotype) could not tolerate tamoxifen for more than 30 days.   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  Biopsy revealed grade 3 IDC ER/PR negative HER2 positive with a Ki-67 of 30%   06/18/2021 Surgery   Right lumpectomy: Grade 3 IDC 1.5 cm, high-grade DCIS, margins negative, 0/2 lymph nodes negative, ER 0%, PR 0%, HER2 3+ positive, Ki-67 30%    Genetic Testing    Negative genetic testing. No pathogenic variants identified on the [facility] Multi-Cancer Panel+RNA. VUS in POLD1 identified called c.2429C>T. The report date is 07/01/2021.  The Multi-Cancer Panel + RNA offered by [facility] includes sequencing and/or deletion duplication testing of the following 84 genes: AIP, ALK, APC, ATM, AXIN2,BAP1,  BARD1, BLM, BMPR1A, BRCA1, BRCA2, BRIP1, CASR, CDC73, CDH1, CDK4, CDKN1B, CDKN1C, CDKN2A (p14ARF), CDKN2A (p16INK4a), CEBPA, CHEK2, CTNNA1, DICER1, DIS3L2, EGFR (c.2369C>T, p.Thr790Met variant only), EPCAM (Deletion/duplication testing only), FH, FLCN, GATA2, GPC3, GREM1 (Promoter region deletion/duplication testing only), HOXB13 (c.251G>A, p.Gly84Glu), HRAS, KIT, MAX, MEN1, MET, MITF (c.952G>A, p.Glu318Lys variant only), MLH1, MSH2, MSH3, MSH6, MUTYH, NBN, NF1, NF2, NTHL1, PALB2, PDGFRA, PHOX2B, PMS2, POLD1, POLE, POT1, PRKAR1A, PTCH1, PTEN, RAD50, RAD51C, RAD51D, RB1, RECQL4, RET, RUNX1, SDHAF2, SDHA (sequence changes only), SDHB, SDHC, SDHD, SMAD4, SMARCA4, SMARCB1, SMARCE1, STK11, SUFU, TERC, TERT, TMEM127, TP53, TSC1, TSC2, VHL, WRN and WT1.   06/18/2021 Cancer Staging   Staging form: Breast, AJCC 8th Edition - Pathologic stage from 06/18/2021: Stage IA (pT1c, pN0, cM0, G3, ER-, PR-, HER2+) - Signed by [name], NP on 10/05/2022 Stage prefix: Initial diagnosis Histologic grading system: 3 grade system   07/21/2021 - 07/06/2022 Chemotherapy   Patient is on Treatment Plan : BREAST Paclitaxel + Trastuzumab q7d / Trastuzumab q21d     11/25/2021 - 01/04/2022 Radiation Therapy   Site Technique Total Dose (Gy) Dose per Fx (Gy) Completed Fx Beam Energies  Breast, Right: Breast_R 3D 45/45 1.8 25/25 6XFFF  Breast, Right: Breast_R_Bst specialPort 5.4/5.4 1.8 3/3 12E     12/29/2022 Progression   CT  chest on 11/01/2022: Right upper lobe nodules largest measuring 5 mm CT chest 12/29/2022: Upper and mid lung zone predominant pulmonary nodules are worrisome for metastatic  disease.  Left and right hepatic lobe masses (2.5 cm and 3.9 cm) are new and are worrisome for metastatic disease.   01/13/2023 Initial Biopsy   Right liver biopsy: Metastatic carcinoma with breast primary, ER-, PR-, HER2 +, Ki-67 50%.    01/13/2023 Cancer Staging   Staging form: Breast, AJCC 8th Edition - Pathologic stage from 01/13/2023: Stage IV (pM1, G3, ER-, PR-, HER2+) - Signed by [name], NP on 02/07/2023 Histologic grading system: 3 grade system   01/17/2023 PET scan   PET CT scan 01/17/2023: Hypermetabolic bilobar hepatic (2 masses measuring 2.8 cm SUV 11.1, another mass 4.1 cm SUV 10.3) and pulmonary metastases (right upper lobe lung nodule 5 mm SUV 2.6 right lower lobe 6 mm nodule SUV 1.8)    02/01/2023 -  Chemotherapy   Patient is on Treatment Plan : BREAST METASTATIC Fam-Trastuzumab Deruxtecan-nxki (Enhertu) (5.4) q21d       CURRENT THERAPY: Enhertu  INTERVAL HISTORY: CLARISSIA MCKEEN 62 y.o. female returns for f/u prior to receiving Enhertu.  She is experiencing lower abdominal pain began Saturday night.  She had diarrhea Saturday morning which is an occasional but expectable event for her.  Sunday she developed more constipation.  She still had a bowel movement however, it was harder to go than usual.  She took a bath, and noted the hot water over her stomach seemed to relax everything and help.  She has had nausea x the past 2 days but no vomiting.  She has taken simethicone, pepcid, exercised, and nothing seemed to help. This impacted her ability to sleep.  She denies any dietary changes.  Left leg is larger than the right which she noticed today along with the left leg feeling swollen.   Her most recent echocardiogram occurred on January 24, 2023 demonstrating a left ventricular ejection fraction of 57%.  Global longitudinal strain was normal.   Patient Active Problem List   Diagnosis Date Noted   Metastases to the liver 02/07/2023   Cancer with pulmonary  metastases 02/07/2023   Cellulitis of finger of right hand 05/12/2022   Eustachian tube dysfunction, right 08/09/2021   Port-A-Cath in place 07/21/2021   Genetic testing 07/02/2021   Hx of basal cell carcinoma 05/26/2021   Family history of breast cancer 05/26/2021   Family history of ovarian cancer 05/26/2021   Family history of pancreatic cancer 05/26/2021   Family history of colon cancer 05/26/2021   Family history of melanoma 05/26/2021   Breast lump in upper inner quadrant 04/30/2021   Post herpetic neuralgia 01/29/2021   Seizures 01/01/2021   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  No sign of residual dz at 04/2018 rad onc f/u and 10/2018 neuro-onc f/u. 03/2019 MRI brain->no resid/no recurrence.   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   Chicken pox    Depression  zoloft in past--?wt gain.   Diplopia    chronic (meningioma-related)   Eustachian tube dysfunction 09/05/2013   Family history of breast cancer    Family history of colon cancer    Family history of melanoma    Family history of ovarian cancer    Family history of pancreatic cancer    GERD (gastroesophageal reflux disease)    History of radiation therapy 02/24/12-04/11/12   right breast/ 45Gy@1 .8Gyx3fx/boost=16Gy@2  Gya10fx.  Latest mammo and u/s 03/2013--normal.   History of radiation therapy 11/30/17- 01/11/18   Right temporal lobe treated to 55.8 Gy with 31 fx of 1.8 Gy   Hx of basal cell carcinoma    Migraine    Palpitations    has taken Metoprolol for palpitations in  the past.   Seizure    2 YEARS AGO,UPDATED 09/07/22   Taste impairment    s/p radiation therapy   Tobacco dependence 09/05/2013    SURGICAL HISTORY: Past Surgical History:  Procedure Laterality Date   APPENDECTOMY  2008   emergency   BRAIN TUMOR EXCISION  2018   Meningioma   BREAST LUMPECTOMY Right 01/04/2012   right breast   BREAST LUMPECTOMY WITH RADIOACTIVE SEED AND SENTINEL LYMPH NODE BIOPSY Right 06/18/2021   Procedure: RIGHT BREAST LUMPECTOMY WITH RADIOACTIVE SEED AND SENTINEL LYMPH NODE BIOPSY;  Surgeon: Harriette Bouillon, MD;  Location: [facility];  Service: General;  Laterality: Right;   COLONOSCOPY     2014 ?   CRANIECTOMY  10/19/2017   at [facility] hospital   IR IMAGING GUIDED PORT INSERTION  02/02/2023   PORTACATH PLACEMENT Right 06/18/2021   Procedure: INSERTION PORT-A-CATH;  Surgeon: Harriette Bouillon, MD;  Location: [facility];  Service: General;  Laterality: Right;   PORTACATH REMOVED     OCTOBER 10,2023   WISDOM TOOTH EXTRACTION  1990    SOCIAL HISTORY: Social History   Socioeconomic History   Marital status: Single    Spouse name: Not on file   Number of children: Not on file   Years of education: Not on file   Highest education level: Not on file  Occupational History   Not on file  Tobacco Use   Smoking status: Former    Packs/day: .5    Types: Cigarettes    Quit date: 10/17/2017    Years since quitting: 5.3    Passive exposure: Never   Smokeless tobacco: Never  Vaping Use   Vaping Use: Former  Substance and Sexual Activity   Alcohol use: Yes    Alcohol/week: 3.0 standard drinks of alcohol    Types: 3 Cans of beer per week    Comment: 3-4 beers/day   Drug use: Not Currently   Sexual activity: Not Currently    Partners: Male  Other Topics Concern   Not on file  Social History Narrative   Marital status/children/pets: Single.  No children.  Lives alone.  Has pets.Orig from [place] in [place].   Education/employment:  Bachelor's degree, retired   Field seismologist:      -smoke alarm in the home:Yes     - wears seatbelt: Yes               Social Determinants of Health   Financial Resource Strain: Not on file  Food Insecurity: Not on file  Transportation Needs: Not on file  Physical Activity: Not on file  Stress: Not on file  Social Connections: Not on file  Intimate Partner Violence: Not on file    FAMILY  HISTORY: Family History  Problem Relation Age of Onset   Anesthesia problems Mother    Breast cancer Mother 57       lumpectomy, chemo, radiation   Colon polyps Brother    Ovarian cancer Maternal Aunt 73   Cancer Maternal Aunt        unknown type   Breast cancer Maternal Aunt 82   Breast cancer Maternal Aunt 40       bilateral   Pancreatic cancer Maternal Uncle        dx 70s   Stomach cancer Paternal Uncle    Cancer Paternal Uncle        unknown type   Lung cancer Paternal Uncle        hx smoking   Dementia Maternal Grandmother    Lung cancer Maternal Grandfather        hx smoking   Pancreatic cancer Paternal Grandmother 85   Melanoma Paternal Grandmother        dx >50, shin   Heart Problems Paternal Grandfather    Colon cancer Cousin 54       maternal first cousin   Breast cancer Cousin        dx 68s, paternal first cousin   Crohn's disease Neg Hx    Esophageal cancer Neg Hx    Rectal cancer Neg Hx    Ulcerative colitis Neg Hx     Review of Systems  Constitutional:  Negative for appetite change, chills, fatigue, fever and unexpected weight change.  HENT:   Negative for hearing loss, lump/mass, mouth sores and trouble swallowing.   Eyes:  Negative for eye problems and icterus.  Respiratory:  Negative for chest tightness, cough and shortness of breath.   Cardiovascular:  Positive for leg swelling. Negative for chest pain and palpitations.  Gastrointestinal:  Positive for constipation and nausea. Negative for abdominal distention, abdominal pain, blood in stool, diarrhea, rectal  pain and vomiting.  Endocrine: Negative for hot flashes.  Genitourinary:  Negative for difficulty urinating.   Musculoskeletal:  Negative for arthralgias.  Skin:  Negative for itching and rash.  Neurological:  Negative for dizziness, extremity weakness, headaches and numbness.  Hematological:  Negative for adenopathy. Does not bruise/bleed easily.  Psychiatric/Behavioral:  Negative for depression. The patient is not nervous/anxious.       PHYSICAL EXAMINATION   Onc Performance Status - 02/07/23 0925       ECOG Perf Status   ECOG Perf Status Restricted in physically strenuous activity but ambulatory and able to carry out work of a light or sedentary nature, e.g., light house work, office work      KPS SCALE   KPS % SCORE Able to carry on normal activity, minor s/s of disease             Vitals:   02/07/23 0922  BP: 135/69  Pulse: 78  Resp: 16  Temp: 98.2 F (36.8 C)  SpO2: 100%    Physical Exam Constitutional:      General: She is not in acute distress.    Appearance: Normal appearance. She is not toxic-appearing.  HENT:     Head: Normocephalic and atraumatic.  Eyes:     General: No scleral icterus. Cardiovascular:     Rate and Rhythm: Normal rate and regular rhythm.     Pulses: Normal pulses.     Heart sounds: Normal heart sounds.  Pulmonary:     Effort: Pulmonary effort is normal.     Breath sounds: Normal breath  sounds.  Abdominal:     General: Abdomen is flat. Bowel sounds are normal. There is no distension.     Palpations: Abdomen is soft. There is no mass.     Tenderness: There is no abdominal tenderness. There is no rebound.  Musculoskeletal:        General: Swelling (+left leg > right leg) present.     Cervical back: Neck supple.  Lymphadenopathy:     Cervical: No cervical adenopathy.  Skin:    General: Skin is warm and dry.     Findings: No rash.  Neurological:     General: No focal deficit present.     Mental Status: She is alert.   Psychiatric:        Mood and Affect: Mood normal.        Behavior: Behavior normal.     LABORATORY DATA:  CBC    Component Value Date/Time   WBC 7.2 02/07/2023 0856   WBC 7.0 01/13/2023 1200   RBC 4.09 02/07/2023 0856   HGB 12.2 02/07/2023 0856   HGB 11.8 12/08/2011 0819   HCT 37.7 02/07/2023 0856   HCT 34.9 12/08/2011 0819   PLT 261 02/07/2023 0856   PLT 314 12/08/2011 0819   MCV 92.2 02/07/2023 0856   MCV 88.3 12/08/2011 0819   MCH 29.8 02/07/2023 0856   MCHC 32.4 02/07/2023 0856   RDW 12.9 02/07/2023 0856   RDW 14.0 12/08/2011 0819   LYMPHSABS 1.8 02/07/2023 0856   LYMPHSABS 2.2 12/08/2011 0819   MONOABS 0.7 02/07/2023 0856   MONOABS 0.8 12/08/2011 0819   EOSABS 0.2 02/07/2023 0856   EOSABS 0.2 12/08/2011 0819   BASOSABS 0.1 02/07/2023 0856   BASOSABS 0.1 12/08/2011 0819    CMP     Component Value Date/Time   NA 138 02/07/2023 0856   NA 140 11/23/2019 0000   K 4.3 02/07/2023 0856   CL 106 02/07/2023 0856   CO2 26 02/07/2023 0856   GLUCOSE 90 02/07/2023 0856   BUN 8 02/07/2023 0856   BUN 9 11/23/2019 0000   CREATININE 0.63 02/07/2023 0856   CREATININE 0.64 09/18/2013 0900   CALCIUM 9.3 02/07/2023 0856   PROT 7.0 02/07/2023 0856   ALBUMIN 4.2 02/07/2023 0856   AST 28 02/07/2023 0856   ALT 30 02/07/2023 0856   ALKPHOS 91 02/07/2023 0856   BILITOT 0.3 02/07/2023 0856   GFRNONAA >60 02/07/2023 0856   GFRAA 113 11/23/2019 0000       ASSESSMENT and THERAPY PLAN:   Malignant neoplasm of central portion of right breast in female, estrogen receptor negative (HCC) Metastatic disease noted March 2024, liver biopsy on January 13, 2023 demonstrated ER negative, PR negative, HER2 positive with a Ki67 of 50%.  Treatment plan: Enhertu given every 3 weeks  Most recent imaging: CT C/A/P 01/13/2023; PET 01/17/2023; ECHO 01/24/2023  Chemo toxicities:  She is due to start chemotherapy today with Enhertu.  Due to her lower abdominal pain we will get a stat KUB to  evaluate her bowels prior to proceeding with treatment.  We will also obtain a Doppler for the left leg swelling.    Her CBC and c-Met are normal so if KUB is okay we can proceed with Enhertu today.  She will return in 1 week for labs and f/u with Dr. Pamelia Hoit.     All questions were answered. The patient knows to call the clinic with any problems, questions or concerns. We can certainly see the patient much sooner if  necessary.  Total encounter time:30 minutes*in face-to-face visit time, chart review, lab review, care coordination, order entry, and documentation of the encounter time.    Lillard Anes, NP 02/07/23 10:11 AM Medical Oncology and Hematology [facility] [address] [place], [place] [address] Tel. [phone]    Fax. [phone]  *Total Encounter Time as defined by the Centers for Medicare and Medicaid Services includes, in addition to the face-to-face time of a patient visit (documented in the note above) non-face-to-face time: obtaining and reviewing outside history, ordering and reviewing medications, tests or procedures, care coordination (communications with other health care professionals or caregivers) and documentation in the medical record.

## 2023-02-07 NOTE — Patient Instructions (Signed)
[facility]  Discharge Instructions: Thank you for choosing [facility] to provide your oncology and hematology care.   If you have a lab appointment with the Cancer Center, please go directly to the Cancer Center and check in at the registration area.   Wear comfortable clothing and clothing appropriate for easy access to any Portacath or PICC line.   We strive to give you quality time with your provider. You may need to reschedule your appointment if you arrive late (15 or more minutes).  Arriving late affects you and other patients whose appointments are after yours.  Also, if you miss three or more appointments without notifying the office, you may be dismissed from the clinic at the provider's discretion.      For prescription refill requests, have your pharmacy contact our office and allow 72 hours for refills to be completed.    Today you received the following chemotherapy and/or immunotherapy agents Enhertu.   To help prevent nausea and vomiting after your treatment, we encourage you to take your nausea medication as directed.  BELOW ARE SYMPTOMS THAT SHOULD BE REPORTED IMMEDIATELY: *FEVER GREATER THAN 100.4 F (38 C) OR HIGHER *CHILLS OR SWEATING *NAUSEA AND VOMITING THAT IS NOT CONTROLLED WITH YOUR NAUSEA MEDICATION *UNUSUAL SHORTNESS OF BREATH *UNUSUAL BRUISING OR BLEEDING *URINARY PROBLEMS (pain or burning when urinating, or frequent urination) *BOWEL PROBLEMS (unusual diarrhea, constipation, pain near the anus) TENDERNESS IN MOUTH AND THROAT WITH OR WITHOUT PRESENCE OF ULCERS (sore throat, sores in mouth, or a toothache) UNUSUAL RASH, SWELLING OR PAIN  UNUSUAL VAGINAL DISCHARGE OR ITCHING   Items with * indicate a potential emergency and should be followed up as soon as possible or go to the Emergency Department if any problems should occur.  Please show the CHEMOTHERAPY ALERT CARD or IMMUNOTHERAPY ALERT CARD at check-in  to the Emergency Department and triage nurse.  Should you have questions after your visit or need to cancel or reschedule your appointment, please contact [facility]  Dept: [phone]  and follow the prompts.  Office hours are 8:00 a.m. to 4:30 p.m. Monday - Friday. Please note that voicemails left after 4:00 p.m. may not be returned until the following business day.  We are closed weekends and major holidays. You have access to a nurse at all times for urgent questions. Please call the main number to the clinic Dept: [phone] and follow the prompts.   For any non-urgent questions, you may also contact your provider using MyChart. We now offer e-Visits for anyone 18 and older to request care online for non-urgent symptoms. For details visit [email or web address].   Also download the MyChart app! Go to the app store, search "MyChart", open the app, select , and log in with your MyChart username and password.   

## 2023-02-07 NOTE — Assessment & Plan Note (Addendum)
Metastatic disease noted March 2024, liver biopsy on January 13, 2023 demonstrated ER negative, PR negative, HER2 positive with a Ki67 of 50%.  Treatment plan: Enhertu given every 3 weeks  Most recent imaging: CT C/A/P 01/13/2023; PET 01/17/2023; ECHO 01/24/2023  Chemo toxicities:  She is due to start chemotherapy today with Enhertu.  Due to her lower abdominal pain we will get a stat KUB to evaluate her bowels prior to proceeding with treatment.  We will also obtain a Doppler for the left leg swelling.    Her CBC and c-Met are normal so if KUB is okay we can proceed with Enhertu today.  She will return in 1 week for labs and f/u with Dr. Pamelia Hoit.

## 2023-02-08 ENCOUNTER — Telehealth: Payer: Self-pay

## 2023-02-08 ENCOUNTER — Other Ambulatory Visit: Payer: Self-pay | Admitting: Adult Health

## 2023-02-08 ENCOUNTER — Ambulatory Visit (HOSPITAL_COMMUNITY)
Admission: RE | Admit: 2023-02-08 | Discharge: 2023-02-08 | Disposition: A | Discharge status: Home / Self Care | Payer: 59 | Source: Ambulatory Visit | Attending: Adult Health | Admitting: Adult Health

## 2023-02-08 ENCOUNTER — Encounter (HOSPITAL_COMMUNITY): Payer: 59

## 2023-02-08 ENCOUNTER — Other Ambulatory Visit: Payer: Self-pay | Admitting: Hematology and Oncology

## 2023-02-08 DIAGNOSIS — R1084 Generalized abdominal pain: Secondary | ICD-10-CM

## 2023-02-08 DIAGNOSIS — R600 Localized edema: Secondary | ICD-10-CM | POA: Diagnosis not present

## 2023-02-08 DIAGNOSIS — M7989 Other specified soft tissue disorders: Secondary | ICD-10-CM | POA: Diagnosis not present

## 2023-02-08 DIAGNOSIS — Z171 Estrogen receptor negative status [ER-]: Secondary | ICD-10-CM

## 2023-02-08 NOTE — Progress Notes (Signed)
KUB shows kidney stone over kidney, patient is in persistent pain and feels it is more GI in nature, ordering CT abdomen pelvis to get better picture of stomach and to evaluate for inflammation/infection in her colon.  Lillard Anes, NP 02/08/23 4:31 PM Medical Oncology and Hematology [facility] [address] [place], [place] [address] Tel. [phone]    Fax. [phone]

## 2023-02-08 NOTE — Telephone Encounter (Signed)
-----   Message from Rae Roam, RN sent at 02/07/2023  1:56 PM EDT ----- Regarding: Dr Pamelia Hoit pt, first time Enhertu Dr Pamelia Hoit pt came in 4/22 for first time Enhertu. Tolerated infusion well. Needs call back.

## 2023-02-08 NOTE — Telephone Encounter (Signed)
LM for patient that this nurse was calling to see how they were doing after their treatment. Please call back to Dr. Gudena's nurse at [phone] if they have any questions or concerns regarding the treatment. 

## 2023-02-09 ENCOUNTER — Other Ambulatory Visit: Payer: Self-pay

## 2023-02-09 ENCOUNTER — Other Ambulatory Visit: Payer: Self-pay | Admitting: Adult Health

## 2023-02-09 ENCOUNTER — Other Ambulatory Visit: Payer: 59

## 2023-02-09 ENCOUNTER — Telehealth: Payer: Self-pay

## 2023-02-09 ENCOUNTER — Ambulatory Visit: Payer: 59 | Admitting: Physician Assistant

## 2023-02-09 ENCOUNTER — Ambulatory Visit (HOSPITAL_COMMUNITY)
Admission: RE | Admit: 2023-02-09 | Discharge: 2023-02-09 | Disposition: A | Discharge status: Home / Self Care | Payer: 59 | Source: Ambulatory Visit | Attending: Adult Health | Admitting: Adult Health

## 2023-02-09 DIAGNOSIS — I700 Atherosclerosis of aorta: Secondary | ICD-10-CM | POA: Diagnosis not present

## 2023-02-09 DIAGNOSIS — R1084 Generalized abdominal pain: Secondary | ICD-10-CM | POA: Insufficient documentation

## 2023-02-09 DIAGNOSIS — Z171 Estrogen receptor negative status [ER-]: Secondary | ICD-10-CM | POA: Insufficient documentation

## 2023-02-09 DIAGNOSIS — C50111 Malignant neoplasm of central portion of right female breast: Secondary | ICD-10-CM | POA: Insufficient documentation

## 2023-02-09 DIAGNOSIS — N200 Calculus of kidney: Secondary | ICD-10-CM | POA: Diagnosis not present

## 2023-02-09 DIAGNOSIS — R109 Unspecified abdominal pain: Secondary | ICD-10-CM | POA: Diagnosis not present

## 2023-02-09 MED ORDER — IOHEXOL 300 MG/ML  SOLN
100.0000 mL | Freq: Once | INTRAMUSCULAR | Status: AC | PRN
  Administered 2023-02-09: 100 mL via INTRAVENOUS

## 2023-02-09 MED ORDER — HEPARIN SOD (PORK) LOCK FLUSH 100 UNIT/ML IV SOLN
500.0000 [IU] | INTRAVENOUS | Status: AC | PRN
  Administered 2023-02-09: 500 [IU]

## 2023-02-09 NOTE — Telephone Encounter (Signed)
Orders entered per NP for CT abd/pelvis. She is scheduled today at 230 for PO contrast with 430 PM scan. Provided phone # for on call onc physician Dr Al Pimple. Scan is pending PA. Message sent to PA team to obtain.

## 2023-02-10 ENCOUNTER — Telehealth: Payer: Self-pay

## 2023-02-10 ENCOUNTER — Inpatient Hospital Stay (HOSPITAL_BASED_OUTPATIENT_CLINIC_OR_DEPARTMENT_OTHER): Payer: 59 | Admitting: Adult Health

## 2023-02-10 ENCOUNTER — Encounter: Payer: Self-pay | Admitting: Adult Health

## 2023-02-10 DIAGNOSIS — R109 Unspecified abdominal pain: Secondary | ICD-10-CM

## 2023-02-10 DIAGNOSIS — C50111 Malignant neoplasm of central portion of right female breast: Secondary | ICD-10-CM | POA: Diagnosis not present

## 2023-02-10 DIAGNOSIS — Z171 Estrogen receptor negative status [ER-]: Secondary | ICD-10-CM | POA: Diagnosis not present

## 2023-02-10 MED ORDER — SUCRALFATE 1 G PO TABS
1.0000 g | ORAL_TABLET | Freq: Three times a day (TID) | ORAL | 0 refills | Status: DC
Start: 2023-02-10 — End: 2023-02-28

## 2023-02-10 MED ORDER — OMEPRAZOLE 40 MG PO CPDR
40.0000 mg | DELAYED_RELEASE_CAPSULE | Freq: Every day | ORAL | 1 refills | Status: DC
Start: 2023-02-10 — End: 2023-03-04

## 2023-02-10 NOTE — Telephone Encounter (Signed)
Called pt to offer phone visit with NP today at 1:30 PM. She accepted. Appt scheduled.

## 2023-02-10 NOTE — Telephone Encounter (Signed)
-----   Message from [name], NP sent at 02/10/2023  9:05 AM EDT ----- Telephone visit with me today to discuss further thanks, lc ----- Message ----- From: De Blanch, LPN Sent: 1/61/0960   8:19 AM EDT To: [name], NP  Would you like me to send a GI referral? No indication for pt reported abd pain when eating.

## 2023-02-10 NOTE — Progress Notes (Signed)
[facility] Cancer Follow up:    Lori Coombe, DO [address]  [address] [place] [address]   DIAGNOSIS:  Cancer Staging  Malignant neoplasm of central portion of right breast in female, estrogen receptor negative Staging form: Breast, AJCC 6th Edition - Pathologic stage from 01/04/2012: Stage I (T1c, N0, M0) - Signed by [name], NP on 10/05/2022 Histologic grade (G): G1 Staging form: Breast, AJCC 8th Edition - Pathologic stage from 06/18/2021: Stage IA (pT1c, pN0, cM0, G3, ER-, PR-, HER2+) - Signed by [name], NP on 10/05/2022 Stage prefix: Initial diagnosis Histologic grading system: 3 grade system - Pathologic stage from 01/13/2023: Stage IV (pM1, G3, ER-, PR-, HER2+) - Signed by [name], NP on 02/07/2023 Histologic grading system: 3 grade system  I connected with Lori Mcmillan on 02/10/23 at  4:15 PM EDT by telephone and verified that I am speaking with the correct person using two identifiers.  I discussed the limitations, risks, security and privacy concerns of performing an evaluation and management service by telephone and the availability of in person appointments.  I also discussed with the patient that there may be a patient responsible charge related to this service. The patient expressed understanding and agreed to proceed.  Patient location: home Provider location: [facility] private office Others participating: none  SUMMARY OF ONCOLOGIC HISTORY: Oncology History  Malignant neoplasm of central portion of right breast in female, estrogen receptor negative  01/04/2012 Initial Diagnosis   Right breast cancer: Stage Ia ER/PR positive HER2 negative status postlumpectomy, radiation (low risk Oncotype) could not tolerate tamoxifen for more than 30 days.   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 No pathogenic variants identified on the [facility] Multi-Cancer Panel+RNA. VUS in POLD1 identified called c.2429C>T. The report date is 07/01/2021.  The Multi-Cancer Panel + RNA offered by [facility] includes sequencing and/or deletion duplication testing of the following 84 genes: AIP, ALK, APC, ATM, AXIN2,BAP1,  BARD1, BLM, BMPR1A, BRCA1, BRCA2, BRIP1, CASR, CDC73, CDH1, CDK4, CDKN1B, CDKN1C, CDKN2A (p14ARF), CDKN2A (p16INK4a), CEBPA, CHEK2, CTNNA1, DICER1, DIS3L2, EGFR (c.2369C>T, p.Thr790Met variant only), EPCAM (Deletion/duplication testing only), FH, FLCN, GATA2, GPC3, GREM1 (Promoter region deletion/duplication testing only), HOXB13 (c.251G>A, p.Gly84Glu), HRAS, KIT, MAX, MEN1, MET, MITF (c.952G>A, p.Glu318Lys variant only), MLH1, MSH2, MSH3, MSH6, MUTYH, NBN, NF1, NF2, NTHL1, PALB2, PDGFRA, PHOX2B, PMS2, POLD1, POLE, POT1, PRKAR1A, PTCH1, PTEN, RAD50, RAD51C, RAD51D, RB1, RECQL4, RET, RUNX1, SDHAF2, SDHA (sequence changes only), SDHB, SDHC, SDHD, SMAD4, SMARCA4, SMARCB1, SMARCE1, STK11, SUFU, TERC, TERT, TMEM127, TP53, TSC1, TSC2, VHL, WRN and WT1.   06/18/2021 Cancer Staging   Staging form: Breast, AJCC 8th Edition - Pathologic stage from 06/18/2021: Stage IA (pT1c, pN0, cM0, G3, ER-, PR-, HER2+) -  Signed by [name], NP on 10/05/2022 Stage prefix: Initial diagnosis Histologic  grading system: 3 grade system   07/21/2021 - 07/06/2022 Chemotherapy   Patient is on Treatment Plan : BREAST Paclitaxel + Trastuzumab q7d / Trastuzumab q21d     11/25/2021 - 01/04/2022 Radiation Therapy   Site Technique Total Dose (Gy) Dose per Fx (Gy) Completed Fx Beam Energies  Breast, Right: Breast_R 3D 45/45 1.8 25/25 6XFFF  Breast, Right: Breast_R_Bst specialPort 5.4/5.4 1.8 3/3 12E     12/29/2022 Progression   CT chest on 11/01/2022: Right upper lobe nodules largest measuring 5 mm CT chest 12/29/2022: Upper and mid lung zone predominant pulmonary nodules are worrisome for metastatic disease.  Left and right hepatic lobe masses (2.5 cm and 3.9 cm) are new and are worrisome for metastatic disease.   01/13/2023 Initial Biopsy   Right liver biopsy: Metastatic carcinoma with breast primary, ER-, PR-, HER2 +, Ki-67 50%.    01/13/2023 Cancer Staging   Staging form: Breast, AJCC 8th Edition - Pathologic stage from 01/13/2023: Stage IV (pM1, G3, ER-, PR-, HER2+) - Signed by [name], NP on 02/07/2023 Histologic grading system: 3 grade system   01/17/2023 PET scan   PET CT scan 01/17/2023: Hypermetabolic bilobar hepatic (2 masses measuring 2.8 cm SUV 11.1, another mass 4.1 cm SUV 10.3) and pulmonary metastases (right upper lobe lung nodule 5 mm SUV 2.6 right lower lobe 6 mm nodule SUV 1.8)    02/07/2023 -  Chemotherapy   Patient is on Treatment Plan : BREAST METASTATIC Fam-Trastuzumab Deruxtecan-nxki (Enhertu) (5.4) q21d       CURRENT THERAPY:  INTERVAL HISTORY: Lori Mcmillan 62 y.o. female returns for   Follow-up after undergoing CT abdomen pelvis on February 09, 2023 that demonstrated stable liver metastasis a stable 3 mm nonobstructing left renal calculus and aortic atherosclerosis.  No GI etiology for her discomfort was identified.  She is having pain in her stomach.  She is unable to eat.  When the food touched her stomach she experienced pain shortly thereafter.  Since that  time she has not had a lot to eat.  She has been eating yogurt, banana, and baked sweet potato.  Her last bowel movement was Sunday.  This all started before her treatment.  She is drinking 90 ounces of water per day.     Patient Active Problem List   Diagnosis Date Noted   Metastases to the liver 02/07/2023   Cancer with pulmonary metastases 02/07/2023   Cellulitis of finger of right hand 05/12/2022   Eustachian tube dysfunction, right 08/09/2021   Port-A-Cath in place 07/21/2021   Genetic testing 07/02/2021   Hx of basal cell carcinoma 05/26/2021   Family history of breast cancer 05/26/2021   Family history of ovarian cancer 05/26/2021   Family history of pancreatic cancer 05/26/2021   Family history of colon cancer 05/26/2021   Family history of melanoma 05/26/2021   Breast lump in upper inner quadrant 04/30/2021   Post herpetic neuralgia 01/29/2021   Seizures 01/01/2021   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  No sign of residual dz at 04/2018 rad onc f/u and  10/2018 neuro-onc f/u. 03/2019 MRI brain->no resid/no recurrence.   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   Chicken pox    Depression    zoloft in past--?wt gain.   Diplopia    chronic (meningioma-related)   Eustachian tube dysfunction 09/05/2013   Family history of breast cancer    Family history of colon cancer    Family history of melanoma    Family history of ovarian cancer    Family history of pancreatic cancer    GERD (gastroesophageal reflux disease)    History of  radiation therapy 02/24/12-04/11/12   right breast/ 45Gy@1 .8Gyx42fx/boost=16Gy@2  Gya54fx.  Latest mammo and u/s 03/2013--normal.   History of radiation therapy 11/30/17- 01/11/18   Right temporal lobe treated to 55.8 Gy with 31 fx of 1.8 Gy   Hx of basal cell carcinoma    Migraine    Palpitations    has taken Metoprolol for palpitations in the past.   Seizure    2 YEARS AGO,UPDATED 09/07/22   Taste impairment    s/p radiation therapy   Tobacco dependence 09/05/2013    SURGICAL HISTORY: Past Surgical History:  Procedure Laterality Date   APPENDECTOMY  2008   emergency   BRAIN TUMOR EXCISION  2018   Meningioma   BREAST LUMPECTOMY Right 01/04/2012   right breast   BREAST LUMPECTOMY WITH RADIOACTIVE SEED AND SENTINEL LYMPH NODE BIOPSY Right 06/18/2021   Procedure: RIGHT BREAST LUMPECTOMY WITH RADIOACTIVE SEED AND SENTINEL LYMPH NODE BIOPSY;  Surgeon: Harriette Bouillon, MD;  Location: [facility];  Service: General;  Laterality: Right;   COLONOSCOPY     2014 ?   CRANIECTOMY  10/19/2017   at [facility]   IR IMAGING GUIDED PORT INSERTION  02/02/2023   PORTACATH PLACEMENT Right 06/18/2021   Procedure: INSERTION PORT-A-CATH;  Surgeon: Harriette Bouillon, MD;  Location: [facility];  Service: General;  Laterality: Right;   PORTACATH REMOVED     OCTOBER 10,2023   WISDOM TOOTH EXTRACTION  1990    SOCIAL HISTORY: Social History   Socioeconomic History   Marital status: Single    Spouse name: Not on file   Number of children: Not on file   Years of education: Not on file   Highest education level: Not on file  Occupational History   Not on file  Tobacco Use   Smoking status: Former    Packs/day: .5    Types: Cigarettes    Quit date: 10/17/2017    Years since quitting: 5.3    Passive exposure: Never   Smokeless tobacco: Never  Vaping Use   Vaping Use: Former  Substance and Sexual Activity   Alcohol use: Yes    Alcohol/week: 3.0 standard drinks of alcohol     Types: 3 Cans of beer per week    Comment: 3-4 beers/day   Drug use: Not Currently   Sexual activity: Not Currently    Partners: Male  Other Topics Concern   Not on file  Social History Narrative   Marital status/children/pets: Single.  No children.  Lives alone.  Has pets.Orig from [place] in [place].   Education/employment: Bachelor's degree, retired   Field seismologist:      -smoke alarm in the home:Yes     - wears seatbelt: Yes               Social  Determinants of Health   Financial Resource Strain: Not on file  Food Insecurity: Not on file  Transportation Needs: Not on file  Physical Activity: Not on file  Stress: Not on file  Social Connections: Not on file  Intimate Partner Violence: Not on file    FAMILY HISTORY: Family History  Problem Relation Age of Onset   Anesthesia problems Mother    Breast cancer Mother 21       lumpectomy, chemo, radiation   Colon polyps Brother    Ovarian cancer Maternal Aunt 50   Cancer Maternal Aunt        unknown type   Breast cancer Maternal Aunt 34   Breast cancer Maternal Aunt 40       bilateral   Pancreatic cancer Maternal Uncle        dx 51s   Stomach cancer Paternal Uncle    Cancer Paternal Uncle        unknown type   Lung cancer Paternal Uncle        hx smoking   Dementia Maternal Grandmother    Lung cancer Maternal Grandfather        hx smoking   Pancreatic cancer Paternal Grandmother 63   Melanoma Paternal Grandmother        dx >50, shin   Heart Problems Paternal Grandfather    Colon cancer Cousin 56       maternal first cousin   Breast cancer Cousin        dx 16s, paternal first cousin   Crohn's disease Neg Hx    Esophageal cancer Neg Hx    Rectal cancer Neg Hx    Ulcerative colitis Neg Hx     Review of Systems  Constitutional:  Negative for appetite change, chills, fatigue, fever and unexpected weight change.  HENT:   Negative for hearing loss, lump/mass and trouble swallowing.   Eyes:  Negative for eye  problems and icterus.  Respiratory:  Negative for chest tightness, cough and shortness of breath.   Cardiovascular:  Negative for chest pain, leg swelling and palpitations.  Gastrointestinal:  Positive for abdominal pain, constipation and nausea. Negative for abdominal distention, blood in stool, diarrhea, rectal pain and vomiting.  Endocrine: Negative for hot flashes.  Genitourinary:  Negative for difficulty urinating.   Musculoskeletal:  Negative for arthralgias.  Skin:  Negative for itching and rash.  Neurological:  Negative for dizziness, extremity weakness, headaches and numbness.  Hematological:  Negative for adenopathy. Does not bruise/bleed easily.  Psychiatric/Behavioral:  Negative for depression. The patient is not nervous/anxious.       PHYSICAL EXAMINATION Patient is in no apparent distress.  Mood and behavior are normal.  Speech is normal.  Breathing is nonlabored.   LABORATORY DATA: None for this visit  RADIOGRAPHIC STUDIES:  CT ABDOMEN PELVIS W CONTRAST  Result Date: 02/09/2023 CLINICAL DATA:  Acute abdominal pain, right breast cancer with known liver and pulmonary metastases EXAM: CT ABDOMEN AND PELVIS WITH CONTRAST TECHNIQUE: Multidetector CT imaging of the abdomen and pelvis was performed using the standard protocol following bolus administration of intravenous contrast. RADIATION DOSE REDUCTION: This exam was performed according to the departmental dose-optimization program which includes automated exposure control, adjustment of the mA and/or kV according to patient size and/or use of iterative reconstruction technique. CONTRAST:  OMNIPAQUE IOHEXOL 300 MG/ML  SOLN COMPARISON:  01/17/2023 FINDINGS: Lower chest: The lung bases are clear. Hepatobiliary: Liver metastatic disease is again identified. Segment 2 mass measures 2.8 x 2.3 cm  reference image 16/2, and segment 6 mass measures 3.5 x 2.6 cm reference image 25/2. A smaller segment 8 lesion reference image 19/2  measures 1 cm. These lesions correspond to the hypermetabolic lesions on recent PET scan. No new liver abnormalities. The gallbladder is unremarkable. Pancreas: Unremarkable. No pancreatic ductal dilatation or surrounding inflammatory changes. Spleen: Normal in size without focal abnormality. Adrenals/Urinary Tract: 3 mm nonobstructing calculus lower pole left kidney. Right kidney is unremarkable. The adrenals and bladder are normal. Stomach/Bowel: No bowel obstruction or ileus. No bowel wall thickening or inflammatory change. Vascular/Lymphatic: Aortic atherosclerosis. No enlarged abdominal or pelvic lymph nodes. Reproductive: Uterus and bilateral adnexa are unremarkable. Other: No free fluid or free intraperitoneal gas. No abdominal wall hernia. Musculoskeletal: No acute or destructive bony lesions. Reconstructed images demonstrate no additional findings. IMPRESSION: 1. Stable liver metastases. 2. Stable 3 mm nonobstructing left renal calculus. 3.  Aortic Atherosclerosis (ICD10-I70.0). Electronically Signed   By: Sharlet Salina M.D.   On: 02/09/2023 19:02     ASSESSMENT and THERAPY PLAN:   Malignant neoplasm of central portion of right breast in female, estrogen receptor negative (HCC) Shatori is a 62 year old woman with metastatic breast cancer here today to discuss her abdominal discomfort.  The CT scan did not give any additional ideas as to what is going on in her abdomen.  I prescribed omeprazole 40 mg daily and Carafate 4 times a day to see if this will help with her postprandial epigastric pain.  I also recommended MiraLAX daily and Senokot S2 tabs p.o. twice daily as a bowel regimen to see if we can get her bowels moving.  There was no free air, obstruction, or ileus noted on her CT scan completed yesterday.  All of these issues were present prior to her starting the Enhertu so they are not related to her treatment.  She will see Dr. Pamelia Hoit on Tuesday for labs and follow-up and he will check in with  her on how she is doing regarding her pain and discomfort.   All questions were answered. The patient knows to call the clinic with any problems, questions or concerns. We can certainly see the patient much sooner if necessary.  Follow up instructions:    -Return to cancer center 02/15/2023 for labs and f/u with Dr. Pamelia Hoit     The patient was provided an opportunity to ask questions and all were answered. The patient agreed with the plan and demonstrated an understanding of the instructions.   The patient was advised to call back or seek an in-person evaluation if the symptoms worsen or if the condition fails to improve as anticipated.   I provided 15 minutes of non face-to-face telephone visit time during this encounter, and > 50% was spent counseling as documented under my assessment & plan.  Lillard Anes, NP 02/10/23 4:41 PM Medical Oncology and Hematology [facility] [address] [place], [place] [address] Tel. [phone]    Fax. [phone]

## 2023-02-10 NOTE — Assessment & Plan Note (Signed)
Lori Mcmillan is a 62 year old woman with metastatic breast cancer here today to discuss her abdominal discomfort.  The CT scan did not give any additional ideas as to what is going on in her abdomen.  I prescribed omeprazole 40 mg daily and Carafate 4 times a day to see if this will help with her postprandial epigastric pain.  I also recommended MiraLAX daily and Senokot S2 tabs p.o. twice daily as a bowel regimen to see if we can get her bowels moving.  There was no free air, obstruction, or ileus noted on her CT scan completed yesterday.  All of these issues were present prior to her starting the Enhertu so they are not related to her treatment.  She will see Dr. Pamelia Hoit on Tuesday for labs and follow-up and he will check in with her on how she is doing regarding her pain and discomfort.

## 2023-02-10 NOTE — Patient Instructions (Signed)
We need to get your bowels moving.  I recommend Miralax daily and Senokot S 2 tab twice a day.  Please let us know if you do not have a bowel movement with this regimen after 2 days. For the stomach pain I will prescribe Omeprazole  first thing in the morning on an empty stomach.  Carafate tablets you can take before meals to help coat your stomach.  Let us know how this helps with the discomfort.

## 2023-02-11 ENCOUNTER — Other Ambulatory Visit: Payer: Self-pay

## 2023-02-11 ENCOUNTER — Encounter: Payer: Self-pay | Admitting: Hematology and Oncology

## 2023-02-11 DIAGNOSIS — Z171 Estrogen receptor negative status [ER-]: Secondary | ICD-10-CM

## 2023-02-11 NOTE — Telephone Encounter (Signed)
error 

## 2023-02-15 ENCOUNTER — Inpatient Hospital Stay (HOSPITAL_BASED_OUTPATIENT_CLINIC_OR_DEPARTMENT_OTHER): Payer: 59 | Admitting: Hematology and Oncology

## 2023-02-15 ENCOUNTER — Inpatient Hospital Stay: Payer: 59

## 2023-02-15 ENCOUNTER — Other Ambulatory Visit: Payer: Self-pay

## 2023-02-15 VITALS — BP 126/55 | HR 89 | Temp 97.3°F | Resp 18 | Ht 63.0 in | Wt 148.1 lb

## 2023-02-15 DIAGNOSIS — R1030 Lower abdominal pain, unspecified: Secondary | ICD-10-CM | POA: Diagnosis not present

## 2023-02-15 DIAGNOSIS — Z5111 Encounter for antineoplastic chemotherapy: Secondary | ICD-10-CM | POA: Diagnosis not present

## 2023-02-15 DIAGNOSIS — Z79899 Other long term (current) drug therapy: Secondary | ICD-10-CM | POA: Diagnosis not present

## 2023-02-15 DIAGNOSIS — Z95828 Presence of other vascular implants and grafts: Secondary | ICD-10-CM

## 2023-02-15 DIAGNOSIS — Z9221 Personal history of antineoplastic chemotherapy: Secondary | ICD-10-CM | POA: Diagnosis not present

## 2023-02-15 DIAGNOSIS — Z923 Personal history of irradiation: Secondary | ICD-10-CM | POA: Diagnosis not present

## 2023-02-15 DIAGNOSIS — C50111 Malignant neoplasm of central portion of right female breast: Secondary | ICD-10-CM

## 2023-02-15 DIAGNOSIS — C7801 Secondary malignant neoplasm of right lung: Secondary | ICD-10-CM | POA: Diagnosis not present

## 2023-02-15 DIAGNOSIS — Z87891 Personal history of nicotine dependence: Secondary | ICD-10-CM | POA: Diagnosis not present

## 2023-02-15 DIAGNOSIS — Z800 Family history of malignant neoplasm of digestive organs: Secondary | ICD-10-CM | POA: Diagnosis not present

## 2023-02-15 DIAGNOSIS — M7989 Other specified soft tissue disorders: Secondary | ICD-10-CM | POA: Diagnosis not present

## 2023-02-15 DIAGNOSIS — R140 Abdominal distension (gaseous): Secondary | ICD-10-CM | POA: Diagnosis not present

## 2023-02-15 DIAGNOSIS — R1013 Epigastric pain: Secondary | ICD-10-CM | POA: Diagnosis not present

## 2023-02-15 DIAGNOSIS — Z808 Family history of malignant neoplasm of other organs or systems: Secondary | ICD-10-CM | POA: Diagnosis not present

## 2023-02-15 DIAGNOSIS — Z171 Estrogen receptor negative status [ER-]: Secondary | ICD-10-CM | POA: Diagnosis not present

## 2023-02-15 DIAGNOSIS — Z85828 Personal history of other malignant neoplasm of skin: Secondary | ICD-10-CM | POA: Diagnosis not present

## 2023-02-15 DIAGNOSIS — C787 Secondary malignant neoplasm of liver and intrahepatic bile duct: Secondary | ICD-10-CM | POA: Diagnosis not present

## 2023-02-15 LAB — CMP (CANCER CENTER ONLY)
ALT: 26 U/L (ref 0–44)
AST: 30 U/L (ref 15–41)
Albumin: 3.9 g/dL (ref 3.5–5.0)
Alkaline Phosphatase: 86 U/L (ref 38–126)
Anion gap: 4 — ABNORMAL LOW (ref 5–15)
BUN: 8 mg/dL (ref 6–20)
CO2: 27 mmol/L (ref 22–32)
Calcium: 8.9 mg/dL (ref 8.9–10.3)
Chloride: 107 mmol/L (ref 98–111)
Creatinine: 0.67 mg/dL (ref 0.44–1.00)
GFR, Estimated: >60 mL/min (ref >60)
Glucose, Bld: 90 mg/dL (ref 70–99)
Potassium: 3.9 mmol/L (ref 3.5–5.1)
Sodium: 138 mmol/L (ref 135–145)
Total Bilirubin: 0.3 mg/dL (ref 0.3–1.2)
Total Protein: 6.4 g/dL — ABNORMAL LOW (ref 6.5–8.1)

## 2023-02-15 LAB — CBC WITH DIFFERENTIAL (CANCER CENTER ONLY)
Abs Immature Granulocytes: 0.05 10*3/uL (ref 0.00–0.07)
Basophils Absolute: 0 10*3/uL (ref 0.0–0.1)
Basophils Relative: 0 %
Eosinophils Absolute: 0.2 10*3/uL (ref 0.0–0.5)
Eosinophils Relative: 3 %
HCT: 34.5 % — ABNORMAL LOW (ref 36.0–46.0)
Hemoglobin: 11.6 g/dL — ABNORMAL LOW (ref 12.0–15.0)
Immature Granulocytes: 1 %
Lymphocytes Relative: 20 %
Lymphs Abs: 1.5 10*3/uL (ref 0.7–4.0)
MCH: 30.1 pg (ref 26.0–34.0)
MCHC: 33.6 g/dL (ref 30.0–36.0)
MCV: 89.6 fL (ref 80.0–100.0)
Monocytes Absolute: 0.3 10*3/uL (ref 0.1–1.0)
Monocytes Relative: 4 %
Neutro Abs: 5.3 10*3/uL (ref 1.7–7.7)
Neutrophils Relative %: 72 %
Platelet Count: 158 10*3/uL (ref 150–400)
RBC: 3.85 MIL/uL — ABNORMAL LOW (ref 3.87–5.11)
RDW: 12.3 % (ref 11.5–15.5)
WBC Count: 7.4 10*3/uL (ref 4.0–10.5)
nRBC: 0 % (ref 0.0–0.2)

## 2023-02-15 MED ORDER — SODIUM CHLORIDE 0.9% FLUSH
10.0000 mL | Freq: Once | INTRAVENOUS | Status: AC
  Administered 2023-02-15: 10 mL

## 2023-02-15 MED ORDER — HEPARIN SOD (PORK) LOCK FLUSH 100 UNIT/ML IV SOLN
500.0000 [IU] | Freq: Once | INTRAVENOUS | Status: AC
  Administered 2023-02-15: 500 [IU]

## 2023-02-15 NOTE — Assessment & Plan Note (Addendum)
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  Adjuvant chemotherapy with Taxol and Herceptin followed by Herceptin maintenance completed 07/06/2022 2. breast radiation completed 01/04/2022 3.  Metastatic disease diagnosis: --------------------------------------------------------------------------------------------------------------------- CT chest on 11/01/2022: Right upper lobe nodules largest measuring 5 mm CT chest 12/29/2022: Upper and mid lung zone predominant pulmonary nodules are worrisome for metastatic disease.  Left and right hepatic lobe masses (2.5 cm and 3.9 cm) are new and are worrisome for metastatic disease. PET CT scan 01/17/2023: Hypermetabolic bilobar hepatic (2 masses measuring 2.8 cm SUV 11.1, another mass 4.1 cm SUV 10.3) and pulmonary metastases (right upper lobe lung nodule 5 mm SUV 2.6 right lower lobe 6 mm nodule SUV 1.8)   01/13/23: Liver Biopsy: Met breast cancer ER 0%, PR 0%, Ki67 50%, HER2 3+   Current treatment: Enhertu cycle 1 day 8 Chemo toxicities: Mild nausea which resolved with Compazine.  Kidney stone causing abdominal pain and distention: Patient tells me that she drank 3 beers and it resolved her problems.  She is not in any pain and the abdomen is definitely not bloated.   Return to clinic in 2 weeks for cycle 2

## 2023-02-15 NOTE — Progress Notes (Signed)
Patient Care Team: Everrett Coombe, DO as PCP - General (Family Medicine) [name], MD as Consulting Physician (Radiation Oncology) Barbaraann Cao Georgeanna Lea, MD as Consulting Physician (Oncology) [facility] ([facility]) Domenic Schwab, Lucianne Lei, MD as Referring Physician (Dermatology) Graylin Shiver, MD as Referring Physician (Otolaryngology) Serena Croissant, MD as Medical Oncologist (Hematology and Oncology) Harriette Bouillon, MD as Consulting Physician (General Surgery)  DIAGNOSIS:  Encounter Diagnosis  Name Primary?   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 No pathogenic variants identified on the [facility] Multi-Cancer Panel+RNA. VUS in POLD1 identified called c.2429C>T. The report date is 07/01/2021.  The Multi-Cancer  Panel + RNA offered by [facility] includes sequencing and/or deletion duplication testing of the following 84 genes: AIP, ALK, APC, ATM, AXIN2,BAP1,  BARD1, BLM, BMPR1A, BRCA1, BRCA2, BRIP1, CASR, CDC73, CDH1, CDK4, CDKN1B, CDKN1C, CDKN2A (p14ARF), CDKN2A (p16INK4a), CEBPA, CHEK2, CTNNA1, DICER1, DIS3L2, EGFR (c.2369C>T, p.Thr790Met variant only), EPCAM (Deletion/duplication testing only), FH, FLCN, GATA2, GPC3, GREM1 (Promoter region deletion/duplication testing only), HOXB13 (c.251G>A, p.Gly84Glu), HRAS, KIT, MAX, MEN1, MET, MITF (c.952G>A, p.Glu318Lys variant only), MLH1, MSH2, MSH3, MSH6, MUTYH, NBN, NF1, NF2, NTHL1, PALB2, PDGFRA, PHOX2B, PMS2, POLD1, POLE, POT1, PRKAR1A, PTCH1, PTEN, RAD50, RAD51C, RAD51D, RB1, RECQL4, RET, RUNX1, SDHAF2, SDHA (sequence changes only), SDHB, SDHC, SDHD, SMAD4, SMARCA4, SMARCB1, SMARCE1, STK11, SUFU, TERC, TERT, TMEM127, TP53, TSC1, TSC2, VHL, WRN and WT1.   06/18/2021 Cancer Staging   Staging form: Breast, AJCC 8th Edition - Pathologic stage from 06/18/2021: Stage IA (pT1c, pN0, cM0, G3, ER-, PR-, HER2+) - Signed by [name], NP on 10/05/2022 Stage prefix: Initial diagnosis Histologic grading system: 3 grade system   07/21/2021 - 07/06/2022 Chemotherapy   Patient is on Treatment Plan : BREAST Paclitaxel + Trastuzumab q7d / Trastuzumab q21d     11/25/2021 - 01/04/2022 Radiation Therapy   Site Technique Total Dose (Gy) Dose per Fx (Gy) Completed Fx Beam Energies  Breast, Right: Breast_R 3D 45/45 1.8 25/25 6XFFF  Breast, Right: Breast_R_Bst specialPort 5.4/5.4 1.8 3/3 12E     12/29/2022 Progression   CT chest on 11/01/2022: Right upper lobe nodules largest measuring 5 mm CT chest 12/29/2022: Upper and mid lung zone predominant pulmonary nodules are worrisome for metastatic disease.  Left and right hepatic lobe masses (2.5 cm and 3.9 cm) are new and are worrisome for metastatic  disease.   01/13/2023 Initial Biopsy   Right liver biopsy: Metastatic carcinoma  with breast primary, ER-, PR-, HER2 +, Ki-67 50%.    01/13/2023 Cancer Staging   Staging form: Breast, AJCC 8th Edition - Pathologic stage from 01/13/2023: Stage IV (pM1, G3, ER-, PR-, HER2+) - Signed by [name], NP on 02/07/2023 Histologic grading system: 3 grade system   01/17/2023 PET scan   PET CT scan 01/17/2023: Hypermetabolic bilobar hepatic (2 masses measuring 2.8 cm SUV 11.1, another mass 4.1 cm SUV 10.3) and pulmonary metastases (right upper lobe lung nodule 5 mm SUV 2.6 right lower lobe 6 mm nodule SUV 1.8)    02/07/2023 -  Chemotherapy   Patient is on Treatment Plan : BREAST METASTATIC Fam-Trastuzumab Deruxtecan-nxki (Enhertu) (5.4) q21d       CHIEF COMPLIANT: Enhertu cycle 1 day 8   INTERVAL HISTORY: Lori Mcmillan is a 62 y.o with the above mentioned metastatic breast cancer. Currently on Enhertu. She reports that treatment went well with no complaints or symptoms. She says she had some mild nausea. She states that she did have a kidney stone that was hurting really bad. She says it has subsided as of today.  ALLERGIES:  has No Known Allergies.  MEDICATIONS:  Current Outpatient Medications  Medication Sig Dispense Refill   acetaminophen (TYLENOL) 500 MG tablet Take 500 mg by mouth as needed.     ALPRAZolam (XANAX) 0.25 MG tablet TAKE 1 TABLET BY MOUTH 2 TIMES DAILY AS NEEDED FOR ANXIETY. 30 tablet 3   diazepam (VALIUM) 2 MG tablet Take by mouth as needed (dizziness).     Famotidine-Ca Carb-Mag Hydrox (PEPCID COMPLETE PO) Take by mouth as needed.     omeprazole (PRILOSEC) 40 MG capsule Take 1 capsule (40 mg total) by mouth daily. 30 capsule 1   lidocaine-prilocaine (EMLA) cream Apply to affected area once (Patient not taking: Reported on 02/15/2023) 30 g 3   ondansetron (ZOFRAN) 8 MG tablet Take 1 tablet (8 mg total) by mouth every 8 (eight) hours as needed for nausea or vomiting. Start on the third day after chemotherapy. (Patient not taking: Reported on  02/15/2023) 30 tablet 1   prochlorperazine (COMPAZINE) 10 MG tablet Take 1 tablet (10 mg total) by mouth every 6 (six) hours as needed for nausea or vomiting. (Patient not taking: Reported on 02/15/2023) 30 tablet 1   sucralfate (CARAFATE) 1 g tablet Take 1 tablet (1 g total) by mouth 4 (four) times daily -  with meals and at bedtime. 40 tablet 0   No current facility-administered medications for this visit.    PHYSICAL EXAMINATION: ECOG PERFORMANCE STATUS: 1 - Symptomatic but completely ambulatory  Vitals:   02/15/23 1115  BP: (!) 126/55  Pulse: 89  Resp: 18  Temp: (!) 97.3 F (36.3 C)  SpO2: 98%   Filed Weights   02/15/23 1115  Weight: 148 lb 1.6 oz (67.2 kg)      LABORATORY DATA:  I have reviewed the data as listed    Latest Ref Rng & Units 02/15/2023   10:58 AM 02/07/2023    8:56 AM 01/13/2023   12:00 PM  CMP  Glucose 70 - 99 mg/dL 90  90  87   BUN 6 - 20 mg/dL 8  8  13    Creatinine 0.44 - 1.00 mg/dL 1.61  0.96  0.45   Sodium 135 - 145 mmol/L 138  138  138   Potassium 3.5 - 5.1 mmol/L 3.9  4.3  3.8   Chloride 98 - 111 mmol/L 107  106  106   CO2 22 - 32 mmol/L 27  26  23    Calcium 8.9 - 10.3 mg/dL 8.9  9.3  8.7   Total Protein 6.5 - 8.1 g/dL 6.4  7.0  6.8   Total Bilirubin 0.3 - 1.2 mg/dL 0.3  0.3  0.7   Alkaline Phos 38 - 126 U/L 86  91  76   AST 15 - 41 U/L 30  28  21    ALT 0 - 44 U/L 26  30  20      Lab Results  Component Value Date   WBC 7.4 02/15/2023   HGB 11.6 (L) 02/15/2023   HCT 34.5 (L) 02/15/2023   MCV 89.6 02/15/2023   PLT 158 02/15/2023   NEUTROABS 5.3 02/15/2023    ASSESSMENT & PLAN:  Malignant neoplasm of central portion of right breast in female, estrogen receptor negative (HCC) 06/18/2021:Right lumpectomy: Grade 3 IDC 1.5 cm, high-grade DCIS, margins negative, 0/2 lymph nodes negative, ER 0%, PR 0%, HER2 3+ positive, Ki-67 30%    (2013 Right breast cancer: Stage Ia ER/PR positive HER2 negative status postlumpectomy, radiation (low risk  Oncotype) could not tolerate tamoxifen for more than 30 days.)   Treatment plan: 1.  Adjuvant chemotherapy with Taxol and Herceptin followed by Herceptin maintenance completed 07/06/2022 2. breast radiation completed 01/04/2022 3.  Metastatic disease diagnosis: --------------------------------------------------------------------------------------------------------------------- CT chest on 11/01/2022: Right upper lobe nodules largest measuring 5 mm CT chest 12/29/2022: Upper and mid lung zone predominant pulmonary nodules are worrisome for metastatic disease.  Left and right hepatic lobe masses (2.5 cm and 3.9 cm) are new and are worrisome for metastatic disease. PET CT scan 01/17/2023: Hypermetabolic bilobar hepatic (2 masses measuring 2.8 cm SUV 11.1, another mass 4.1 cm SUV 10.3) and pulmonary metastases (right upper lobe lung nodule 5 mm SUV 2.6 right lower lobe 6 mm nodule SUV 1.8)   01/13/23: Liver Biopsy: Met breast cancer ER 0%, PR 0%, Ki67 50%, HER2 3+   Current treatment: Enhertu cycle 1 day 8 Chemo toxicities: Mild nausea which resolved with Compazine.  Kidney stone causing abdominal pain and distention: Patient tells me that she drank 3 beers and it resolved her problems.  She is not in any pain and the abdomen is definitely not bloated.   Return to clinic in 2 weeks for cycle 2   No orders of the defined types were placed in this encounter.  The patient has a good understanding of the overall plan. she agrees with it. she will call with any problems that may develop before the next visit here. Total time spent: 30 mins including face to face time and time spent for planning, charting and co-ordination of care   Tamsen Meek, MD 02/15/23    I Janan Ridge am acting as a Neurosurgeon for [employer]  I have reviewed the above documentation for accuracy and completeness, and I agree with the above.

## 2023-02-16 ENCOUNTER — Telehealth: Payer: Self-pay | Admitting: Family Medicine

## 2023-02-16 NOTE — Telephone Encounter (Signed)
Called patient to schedule Medicare Annual Wellness Visit (AWV). Left message for patient to call back and schedule Medicare Annual Wellness Visit (AWV).  Last date of AWV: Never  Please schedule an appointment at any time with [facility].  If any questions, please contact me at [phone].  Thank you ,  Morgan Jessup Patient Access Advocate II Direct Dial: [phone]  

## 2023-02-22 ENCOUNTER — Other Ambulatory Visit: Payer: 59

## 2023-02-22 ENCOUNTER — Ambulatory Visit: Payer: 59 | Admitting: Hematology and Oncology

## 2023-02-22 ENCOUNTER — Ambulatory Visit: Payer: 59

## 2023-02-23 ENCOUNTER — Encounter: Payer: Self-pay | Admitting: Hematology and Oncology

## 2023-02-23 ENCOUNTER — Telehealth: Payer: Self-pay | Admitting: Hematology and Oncology

## 2023-02-23 NOTE — Telephone Encounter (Signed)
Scheduled appointments per WQ. Patient is aware of the made appointments.  

## 2023-02-24 NOTE — Progress Notes (Signed)
Patient Care Team: Everrett Coombe, DO as PCP - General (Family Medicine) [name], MD as Consulting Physician (Radiation Oncology) Barbaraann Cao Georgeanna Lea, MD as Consulting Physician (Oncology) [facility] ([facility]) Domenic Schwab, Lucianne Lei, MD as Referring Physician (Dermatology) Graylin Shiver, MD as Referring Physician (Otolaryngology) Serena Croissant, MD as Medical Oncologist (Hematology and Oncology) Harriette Bouillon, MD as Consulting Physician (General Surgery)  DIAGNOSIS:  Encounter Diagnosis  Name Primary?   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 No pathogenic variants identified on the [facility] Multi-Cancer Panel+RNA. VUS in POLD1 identified called c.2429C>T. The report date is 07/01/2021.  The Multi-Cancer  Panel + RNA offered by [facility] includes sequencing and/or deletion duplication testing of the following 84 genes: AIP, ALK, APC, ATM, AXIN2,BAP1,  BARD1, BLM, BMPR1A, BRCA1, BRCA2, BRIP1, CASR, CDC73, CDH1, CDK4, CDKN1B, CDKN1C, CDKN2A (p14ARF), CDKN2A (p16INK4a), CEBPA, CHEK2, CTNNA1, DICER1, DIS3L2, EGFR (c.2369C>T, p.Thr790Met variant only), EPCAM (Deletion/duplication testing only), FH, FLCN, GATA2, GPC3, GREM1 (Promoter region deletion/duplication testing only), HOXB13 (c.251G>A, p.Gly84Glu), HRAS, KIT, MAX, MEN1, MET, MITF (c.952G>A, p.Glu318Lys variant only), MLH1, MSH2, MSH3, MSH6, MUTYH, NBN, NF1, NF2, NTHL1, PALB2, PDGFRA, PHOX2B, PMS2, POLD1, POLE, POT1, PRKAR1A, PTCH1, PTEN, RAD50, RAD51C, RAD51D, RB1, RECQL4, RET, RUNX1, SDHAF2, SDHA (sequence changes only), SDHB, SDHC, SDHD, SMAD4, SMARCA4, SMARCB1, SMARCE1, STK11, SUFU, TERC, TERT, TMEM127, TP53, TSC1, TSC2, VHL, WRN and WT1.   06/18/2021 Cancer Staging   Staging form: Breast, AJCC 8th Edition - Pathologic stage from 06/18/2021: Stage IA (pT1c, pN0, cM0, G3, ER-, PR-, HER2+) - Signed by [name], NP on 10/05/2022 Stage prefix: Initial diagnosis Histologic grading system: 3 grade system   07/21/2021 - 07/06/2022 Chemotherapy   Patient is on Treatment Plan : BREAST Paclitaxel + Trastuzumab q7d / Trastuzumab q21d     11/25/2021 - 01/04/2022 Radiation Therapy   Site Technique Total Dose (Gy) Dose per Fx (Gy) Completed Fx Beam Energies  Breast, Right: Breast_R 3D 45/45 1.8 25/25 6XFFF  Breast, Right: Breast_R_Bst specialPort 5.4/5.4 1.8 3/3 12E     12/29/2022 Progression   CT chest on 11/01/2022: Right upper lobe nodules largest measuring 5 mm CT chest 12/29/2022: Upper and mid lung zone predominant pulmonary nodules are worrisome for metastatic disease.  Left and right hepatic lobe masses (2.5 cm and 3.9 cm) are new and are worrisome for metastatic  disease.   01/13/2023 Initial Biopsy   Right liver biopsy: Metastatic carcinoma  with breast primary, ER-, PR-, HER2 +, Ki-67 50%.    01/13/2023 Cancer Staging   Staging form: Breast, AJCC 8th Edition - Pathologic stage from 01/13/2023: Stage IV (pM1, G3, ER-, PR-, HER2+) - Signed by [name], NP on 02/07/2023 Histologic grading system: 3 grade system   01/17/2023 PET scan   PET CT scan 01/17/2023: Hypermetabolic bilobar hepatic (2 masses measuring 2.8 cm SUV 11.1, another mass 4.1 cm SUV 10.3) and pulmonary metastases (right upper lobe lung nodule 5 mm SUV 2.6 right lower lobe 6 mm nodule SUV 1.8)    02/07/2023 -  Chemotherapy   Patient is on Treatment Plan : BREAST METASTATIC Fam-Trastuzumab Deruxtecan-nxki (Enhertu) (5.4) q21d       CHIEF COMPLIANT: Enhertu cycle 2   INTERVAL HISTORY: Lori Mcmillan is a 62 y.o with the above mentioned metastatic breast cancer. Currently on Enhertu. She presents to the clinic for a follow-up. She reports that she is tolerating treatment.   ALLERGIES:  has No Known Allergies.  MEDICATIONS:  Current Outpatient Medications  Medication Sig Dispense Refill   acetaminophen (TYLENOL) 500 MG tablet Take 500 mg by mouth as needed.     ALPRAZolam (XANAX) 0.25 MG tablet TAKE 1 TABLET BY MOUTH 2 TIMES DAILY AS NEEDED FOR ANXIETY. 30 tablet 3   diazepam (VALIUM) 2 MG tablet Take by mouth as needed (dizziness).     Famotidine-Ca Carb-Mag Hydrox (PEPCID COMPLETE PO) Take by mouth as needed.     lidocaine-prilocaine (EMLA) cream Apply to affected area once (Patient not taking: Reported on 02/15/2023) 30 g 3   omeprazole (PRILOSEC) 40 MG capsule Take 1 capsule (40 mg total) by mouth daily. 30 capsule 1   ondansetron (ZOFRAN) 8 MG tablet Take 1 tablet (8 mg total) by mouth every 8 (eight) hours as needed for nausea or vomiting. Start on the third day after chemotherapy. (Patient not taking: Reported on 02/15/2023) 30 tablet 1   prochlorperazine (COMPAZINE) 10 MG tablet Take 1 tablet (10 mg total) by mouth every 6 (six) hours as needed  for nausea or vomiting. (Patient not taking: Reported on 02/15/2023) 30 tablet 1   No current facility-administered medications for this visit.    PHYSICAL EXAMINATION: ECOG PERFORMANCE STATUS: 1 - Symptomatic but completely ambulatory  Vitals:   02/28/23 1151  BP: (!) 139/93  Pulse: 78  Resp: 18  Temp: 97.8 F (36.6 C)  SpO2: 100%   Filed Weights   02/28/23 1151  Weight: 148 lb 11.2 oz (67.4 kg)      LABORATORY DATA:  I have reviewed the data as listed    Latest Ref Rng & Units 02/28/2023   11:14 AM 02/15/2023   10:58 AM 02/07/2023    8:56 AM  CMP  Glucose 70 - 99 mg/dL 94  90  90   BUN 6 - 20 mg/dL 7  8  8    Creatinine 0.44 - 1.00 mg/dL 3.47  4.25  9.56   Sodium 135 - 145 mmol/L 139  138  138   Potassium 3.5 - 5.1 mmol/L 4.1  3.9  4.3   Chloride 98 - 111 mmol/L 107  107  106   CO2 22 - 32 mmol/L 25  27  26    Calcium 8.9 - 10.3 mg/dL 8.7  8.9  9.3   Total Protein 6.5 - 8.1 g/dL 6.7  6.4  7.0   Total Bilirubin 0.3 -  1.2 mg/dL 0.2  0.3  0.3   Alkaline Phos 38 - 126 U/L 85  86  91   AST 15 - 41 U/L 31  30  28    ALT 0 - 44 U/L 46  26  30     Lab Results  Component Value Date   WBC 5.2 02/28/2023   HGB 11.6 (L) 02/28/2023   HCT 36.7 02/28/2023   MCV 92.2 02/28/2023   PLT 385 02/28/2023   NEUTROABS 2.3 02/28/2023    ASSESSMENT & PLAN:  Malignant neoplasm of central portion of right breast in female, estrogen receptor negative (HCC) 06/18/2021:Right lumpectomy: 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  Adjuvant chemotherapy with Taxol and Herceptin followed by Herceptin maintenance completed 07/06/2022 2. breast radiation completed 01/04/2022 3.  Metastatic disease  diagnosis: --------------------------------------------------------------------------------------------------------------------- CT chest on 11/01/2022: Right upper lobe nodules largest measuring 5 mm CT chest 12/29/2022: Upper and mid lung zone predominant pulmonary nodules are worrisome for metastatic disease.  Left and right hepatic lobe masses (2.5 cm and 3.9 cm) are new and are worrisome for metastatic disease. PET CT scan 01/17/2023: Hypermetabolic bilobar hepatic (2 masses measuring 2.8 cm SUV 11.1, another mass 4.1 cm SUV 10.3) and pulmonary metastases (right upper lobe lung nodule 5 mm SUV 2.6 right lower lobe 6 mm nodule SUV 1.8)   01/13/23: Liver Biopsy: Met breast cancer ER 0%, PR 0%, Ki67 50%, HER2 3+   Current treatment: Enhertu cycle 2 Chemo toxicities: Mild nausea which resolved with Compazine.   Kidney stone causing abdominal pain and distention: Patient tells me that she drank 3 beers and it resolved her problems.   Our plan is to obtain scans after 4 rounds of chemo.   Return to clinic in 3 weeks for cycle 3    No orders of the defined types were placed in this encounter.  The patient has a good understanding of the overall plan. she agrees with it. she will call with any problems that may develop before the next visit here. Total time spent: 30 mins including face to face time and time spent for planning, charting and co-ordination of care   Tamsen Meek, MD 02/28/23    I Janan Ridge am acting as a Neurosurgeon for [employer]  I have reviewed the above documentation for accuracy and completeness, and I agree with the above.

## 2023-02-25 MED FILL — Dexamethasone Sodium Phosphate Inj 100 MG/10ML: INTRAMUSCULAR | Qty: 1 | Status: AC

## 2023-02-25 MED FILL — Fosaprepitant Dimeglumine For IV Infusion 150 MG (Base Eq): INTRAVENOUS | Qty: 5 | Status: AC

## 2023-02-28 ENCOUNTER — Other Ambulatory Visit: Payer: Self-pay

## 2023-02-28 ENCOUNTER — Inpatient Hospital Stay: Payer: 59

## 2023-02-28 ENCOUNTER — Inpatient Hospital Stay (HOSPITAL_BASED_OUTPATIENT_CLINIC_OR_DEPARTMENT_OTHER): Payer: 59 | Admitting: Hematology and Oncology

## 2023-02-28 ENCOUNTER — Inpatient Hospital Stay: Payer: 59 | Attending: Hematology and Oncology

## 2023-02-28 VITALS — BP 139/93 | HR 78 | Temp 97.8°F | Resp 18 | Ht 63.0 in | Wt 148.7 lb

## 2023-02-28 DIAGNOSIS — C50111 Malignant neoplasm of central portion of right female breast: Secondary | ICD-10-CM | POA: Insufficient documentation

## 2023-02-28 DIAGNOSIS — Z923 Personal history of irradiation: Secondary | ICD-10-CM | POA: Diagnosis not present

## 2023-02-28 DIAGNOSIS — Z5111 Encounter for antineoplastic chemotherapy: Secondary | ICD-10-CM | POA: Diagnosis present

## 2023-02-28 DIAGNOSIS — Z171 Estrogen receptor negative status [ER-]: Secondary | ICD-10-CM | POA: Diagnosis not present

## 2023-02-28 DIAGNOSIS — Z5112 Encounter for antineoplastic immunotherapy: Secondary | ICD-10-CM | POA: Diagnosis not present

## 2023-02-28 DIAGNOSIS — C787 Secondary malignant neoplasm of liver and intrahepatic bile duct: Secondary | ICD-10-CM | POA: Insufficient documentation

## 2023-02-28 DIAGNOSIS — C7801 Secondary malignant neoplasm of right lung: Secondary | ICD-10-CM | POA: Diagnosis not present

## 2023-02-28 DIAGNOSIS — Z95828 Presence of other vascular implants and grafts: Secondary | ICD-10-CM

## 2023-02-28 LAB — CMP (CANCER CENTER ONLY)
ALT: 46 U/L — ABNORMAL HIGH (ref 0–44)
AST: 31 U/L (ref 15–41)
Albumin: 4.1 g/dL (ref 3.5–5.0)
Alkaline Phosphatase: 85 U/L (ref 38–126)
Anion gap: 7 (ref 5–15)
BUN: 7 mg/dL (ref 6–20)
CO2: 25 mmol/L (ref 22–32)
Calcium: 8.7 mg/dL — ABNORMAL LOW (ref 8.9–10.3)
Chloride: 107 mmol/L (ref 98–111)
Creatinine: 0.61 mg/dL (ref 0.44–1.00)
GFR, Estimated: >60 mL/min (ref >60)
Glucose, Bld: 94 mg/dL (ref 70–99)
Potassium: 4.1 mmol/L (ref 3.5–5.1)
Sodium: 139 mmol/L (ref 135–145)
Total Bilirubin: 0.2 mg/dL — ABNORMAL LOW (ref 0.3–1.2)
Total Protein: 6.7 g/dL (ref 6.5–8.1)

## 2023-02-28 LAB — CBC WITH DIFFERENTIAL (CANCER CENTER ONLY)
Abs Immature Granulocytes: 0.02 10*3/uL (ref 0.00–0.07)
Basophils Absolute: 0.1 10*3/uL (ref 0.0–0.1)
Basophils Relative: 1 %
Eosinophils Absolute: 0.2 10*3/uL (ref 0.0–0.5)
Eosinophils Relative: 4 %
HCT: 36.7 % (ref 36.0–46.0)
Hemoglobin: 11.6 g/dL — ABNORMAL LOW (ref 12.0–15.0)
Immature Granulocytes: 0 %
Lymphocytes Relative: 37 %
Lymphs Abs: 1.9 10*3/uL (ref 0.7–4.0)
MCH: 29.1 pg (ref 26.0–34.0)
MCHC: 31.6 g/dL (ref 30.0–36.0)
MCV: 92.2 fL (ref 80.0–100.0)
Monocytes Absolute: 0.7 10*3/uL (ref 0.1–1.0)
Monocytes Relative: 13 %
Neutro Abs: 2.3 10*3/uL (ref 1.7–7.7)
Neutrophils Relative %: 45 %
Platelet Count: 385 10*3/uL (ref 150–400)
RBC: 3.98 MIL/uL (ref 3.87–5.11)
RDW: 13.2 % (ref 11.5–15.5)
WBC Count: 5.2 10*3/uL (ref 4.0–10.5)
nRBC: 0 % (ref 0.0–0.2)

## 2023-02-28 MED ORDER — SODIUM CHLORIDE 0.9% FLUSH
10.0000 mL | Freq: Once | INTRAVENOUS | Status: AC
  Administered 2023-02-28: 10 mL

## 2023-02-28 MED ORDER — FAM-TRASTUZUMAB DERUXTECAN-NXKI CHEMO 100 MG IV SOLR
5.4000 mg/kg | Freq: Once | INTRAVENOUS | Status: AC
  Administered 2023-02-28: 400 mg via INTRAVENOUS
  Filled 2023-02-28: qty 20

## 2023-02-28 MED ORDER — SODIUM CHLORIDE 0.9% FLUSH: 10.0000 mL | INTRAVENOUS | Status: DC | PRN

## 2023-02-28 MED ORDER — HEPARIN SOD (PORK) LOCK FLUSH 100 UNIT/ML IV SOLN: 500.0000 [IU] | Freq: Once | INTRAVENOUS | Status: DC | PRN

## 2023-02-28 MED ORDER — SODIUM CHLORIDE 0.9 % IV SOLN
150.0000 mg | Freq: Once | INTRAVENOUS | Status: AC
  Administered 2023-02-28: 150 mg via INTRAVENOUS
  Filled 2023-02-28: qty 150

## 2023-02-28 MED ORDER — DIPHENHYDRAMINE HCL 25 MG PO CAPS
25.0000 mg | ORAL_CAPSULE | Freq: Once | ORAL | Status: AC
  Administered 2023-02-28: 25 mg via ORAL
  Filled 2023-02-28: qty 1

## 2023-02-28 MED ORDER — SODIUM CHLORIDE 0.9 % IV SOLN
10.0000 mg | Freq: Once | INTRAVENOUS | Status: AC
  Administered 2023-02-28: 10 mg via INTRAVENOUS
  Filled 2023-02-28: qty 10

## 2023-02-28 MED ORDER — ACETAMINOPHEN 325 MG PO TABS
650.0000 mg | ORAL_TABLET | Freq: Once | ORAL | Status: AC
  Administered 2023-02-28: 650 mg via ORAL
  Filled 2023-02-28: qty 2

## 2023-02-28 MED ORDER — DEXTROSE 5 % IV SOLN: Freq: Once | INTRAVENOUS | Status: AC

## 2023-02-28 MED ORDER — PALONOSETRON HCL INJECTION 0.25 MG/5ML
0.2500 mg | Freq: Once | INTRAVENOUS | Status: AC
  Administered 2023-02-28: 0.25 mg via INTRAVENOUS
  Filled 2023-02-28: qty 5

## 2023-02-28 NOTE — Assessment & Plan Note (Signed)
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  Adjuvant chemotherapy with Taxol and Herceptin followed by Herceptin maintenance completed 07/06/2022 2. breast radiation completed 01/04/2022 3.  Metastatic disease diagnosis: --------------------------------------------------------------------------------------------------------------------- CT chest on 11/01/2022: Right upper lobe nodules largest measuring 5 mm CT chest 12/29/2022: Upper and mid lung zone predominant pulmonary nodules are worrisome for metastatic disease.  Left and right hepatic lobe masses (2.5 cm and 3.9 cm) are new and are worrisome for metastatic disease. PET CT scan 01/17/2023: Hypermetabolic bilobar hepatic (2 masses measuring 2.8 cm SUV 11.1, another mass 4.1 cm SUV 10.3) and pulmonary metastases (right upper lobe lung nodule 5 mm SUV 2.6 right lower lobe 6 mm nodule SUV 1.8)   01/13/23: Liver Biopsy: Met breast cancer ER 0%, PR 0%, Ki67 50%, HER2 3+   Current treatment: Enhertu cycle 2 Chemo toxicities: Mild nausea which resolved with Compazine.   Kidney stone causing abdominal pain and distention: Patient tells me that she drank 3 beers and it resolved her problems.  She is not in any pain and the abdomen is definitely not bloated.     Return to clinic in 3 weeks for cycle 3

## 2023-03-03 ENCOUNTER — Ambulatory Visit (INDEPENDENT_AMBULATORY_CARE_PROVIDER_SITE_OTHER): Payer: 59 | Admitting: Family Medicine

## 2023-03-03 ENCOUNTER — Encounter: Payer: Self-pay | Admitting: Family Medicine

## 2023-03-03 VITALS — BP 91/61 | HR 77 | Ht 63.0 in | Wt 149.0 lb

## 2023-03-03 DIAGNOSIS — Z13220 Encounter for screening for lipoid disorders: Secondary | ICD-10-CM

## 2023-03-03 DIAGNOSIS — Z0000 Encounter for general adult medical examination without abnormal findings: Secondary | ICD-10-CM

## 2023-03-03 NOTE — Progress Notes (Signed)
Lori Mcmillan - 62 y.o. female MRN [id number]  Date of birth: 1961/09/26  Subjective Chief Complaint  Patient presents with   Annual Exam    HPI Lori Mcmillan is a 62 y.o. female here today for annual exam.   I last saw her in 2022.  She has history of breast cancer.  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  She is doing pretty well, has some generalized fatigue.    She remains pretty active.  She feels that diet is pretty good.   She is a non-smoker.  She does consume EtOH occasionally.   Review of Systems  Constitutional:  Negative for chills, fever, malaise/fatigue and weight loss.  HENT:  Negative for congestion, ear pain and sore throat.   Eyes:  Negative for blurred vision, double vision and pain.  Respiratory:  Negative for cough and shortness of breath.   Cardiovascular:  Negative for chest pain and palpitations.  Gastrointestinal:  Negative for abdominal pain, blood in stool, constipation, heartburn and nausea.  Genitourinary:  Negative for dysuria and urgency.  Musculoskeletal:  Negative for joint pain and myalgias.  Neurological:  Negative for dizziness and headaches.  Endo/Heme/Allergies:  Does not bruise/bleed easily.  Psychiatric/Behavioral:  Negative for depression. The patient is not nervous/anxious and does not have insomnia.     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  No sign of residual dz at 04/2018 rad onc f/u and 10/2018 neuro-onc f/u. 03/2019 MRI brain->no resid/no recurrence.   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   Chicken pox    Depression    zoloft in past--?wt gain.   Diplopia    chronic (meningioma-related)    Eustachian tube dysfunction 09/05/2013   Family history of breast cancer    Family history of colon cancer    Family history of melanoma    Family history of ovarian cancer    Family history of pancreatic cancer    GERD (gastroesophageal reflux disease)    History of radiation therapy 02/24/12-04/11/12   right breast/ 45Gy@1 .8Gyx66fx/boost=16Gy@2  Gya2fx.  Latest mammo and u/s 03/2013--normal.   History of radiation therapy 11/30/17- 01/11/18   Right temporal lobe treated to 55.8 Gy with 31 fx of 1.8 Gy   Hx of basal cell carcinoma    Migraine    Palpitations    has taken Metoprolol for palpitations in the past.   Seizure (HCC)    2 YEARS AGO,UPDATED 09/07/22   Taste impairment    s/p radiation therapy   Tobacco dependence 09/05/2013    Past Surgical History:  Procedure Laterality Date   APPENDECTOMY  2008   emergency   BRAIN TUMOR EXCISION  2018   Meningioma   BREAST LUMPECTOMY Right 01/04/2012   right breast   BREAST LUMPECTOMY WITH RADIOACTIVE SEED AND SENTINEL LYMPH NODE BIOPSY Right 06/18/2021   Procedure: RIGHT BREAST LUMPECTOMY WITH RADIOACTIVE SEED AND SENTINEL LYMPH NODE BIOPSY;  Surgeon: Harriette Bouillon, MD;  Location: [facility];  Service: General;  Laterality: Right;   COLONOSCOPY     2014 ?   CRANIECTOMY  10/19/2017   at [name] hospital  IR IMAGING GUIDED PORT INSERTION  02/02/2023   PORTACATH PLACEMENT Right 06/18/2021   Procedure: INSERTION PORT-A-CATH;  Surgeon: Harriette Bouillon, MD;  Location: [facility];  Service: General;  Laterality: Right;   PORTACATH REMOVED     OCTOBER 10,2023   WISDOM TOOTH EXTRACTION  1990    Social History   Socioeconomic History   Marital status: Single    Spouse name: Not on file   Number of children: Not on file   Years of education: Not on file   Highest education level: Not on file  Occupational History   Not on file  Tobacco Use   Smoking status: Former    Packs/day: .5    Types: Cigarettes     Quit date: 10/17/2017    Years since quitting: 5.3    Passive exposure: Never   Smokeless tobacco: Never  Vaping Use   Vaping Use: Former  Substance and Sexual Activity   Alcohol use: Yes    Alcohol/week: 3.0 standard drinks of alcohol    Types: 3 Cans of beer per week    Comment: 3-4 beers/day   Drug use: Not Currently   Sexual activity: Not Currently    Partners: Male  Other Topics Concern   Not on file  Social History Narrative   Marital status/children/pets: Single.  No children.  Lives alone.  Has pets.Orig from [place] in [place].   Education/employment: Bachelor's degree, retired   Field seismologist:      -smoke alarm in the home:Yes     - wears seatbelt: Yes               Social Determinants of Health   Financial Resource Strain: Not on file  Food Insecurity: Not on file  Transportation Needs: Not on file  Physical Activity: Not on file  Stress: Not on file  Social Connections: Not on file    Family History  Problem Relation Age of Onset   Anesthesia problems Mother    Breast cancer Mother 8       lumpectomy, chemo, radiation   Colon polyps Brother    Ovarian cancer Maternal Aunt 84   Cancer Maternal Aunt        unknown type   Breast cancer Maternal Aunt 14   Breast cancer Maternal Aunt 40       bilateral   Pancreatic cancer Maternal Uncle        dx 22s   Stomach cancer Paternal Uncle    Cancer Paternal Uncle        unknown type   Lung cancer Paternal Uncle        hx smoking   Dementia Maternal Grandmother    Lung cancer Maternal Grandfather        hx smoking   Pancreatic cancer Paternal Grandmother 67   Melanoma Paternal Grandmother        dx >50, shin   Heart Problems Paternal Grandfather    Colon cancer Cousin 44       maternal first cousin   Breast cancer Cousin        dx 70s, paternal first cousin   Crohn's disease Neg Hx    Esophageal cancer Neg Hx    Rectal cancer Neg Hx    Ulcerative colitis Neg Hx     Health Maintenance  Topic Date  Due   COVID-19 Vaccine (3 - Pfizer risk series) 07/08/2023 (Originally 02/25/2020)   Medicare Annual Wellness (AWV)  07/08/2023 (Originally 1961-02-11)   Zoster Vaccines-  Shingrix (1 of 2) 07/08/2023 (Originally 03/28/1982)   HIV Screening  03/02/2024 (Originally 03/28/1978)   INFLUENZA VACCINE  05/19/2023   MAMMOGRAM  05/07/2024   PAP SMEAR-Modifier  06/10/2025   DTaP/Tdap/Td (2 - Td or Tdap) 03/05/2029   COLONOSCOPY (Pts 45-69yrs Insurance coverage will need to be confirmed)  09/29/2029   Hepatitis C Screening  Completed   HPV VACCINES  Aged Out     ----------------------------------------------------------------------------------------------------------------------------------------------------------------------------------------------------------------- Physical Exam BP 91/61 (BP Location: Left Arm, Patient Position: Sitting, Cuff Size: Normal)   Pulse 77   Ht 5\' 3"  (1.6 m)   Wt 149 lb (67.6 kg)   LMP 05/11/2013   SpO2 95%   BMI 26.39 kg/m   Physical Exam Constitutional:      General: She is not in acute distress. HENT:     Head: Normocephalic and atraumatic.     Right Ear: Tympanic membrane and ear canal normal.     Left Ear: Tympanic membrane and ear canal normal.     Nose: Nose normal.  Eyes:     General: No scleral icterus.    Conjunctiva/sclera: Conjunctivae normal.  Neck:     Thyroid: No thyromegaly.  Cardiovascular:     Rate and Rhythm: Normal rate and regular rhythm.     Heart sounds: Normal heart sounds.  Pulmonary:     Effort: Pulmonary effort is normal.     Breath sounds: Normal breath sounds.  Abdominal:     General: Bowel sounds are normal. There is no distension.     Palpations: Abdomen is soft.     Tenderness: There is no abdominal tenderness. There is no guarding.  Musculoskeletal:        General: Normal range of motion.     Cervical back: Normal range of motion and neck supple.  Lymphadenopathy:     Cervical: No cervical adenopathy.  Skin:     General: Skin is warm and dry.     Findings: No rash.  Neurological:     General: No focal deficit present.     Mental Status: She is alert and oriented to person, place, and time.     Cranial Nerves: No cranial nerve deficit.     Coordination: Coordination normal.  Psychiatric:        Mood and Affect: Mood normal.        Behavior: Behavior normal.     ------------------------------------------------------------------------------------------------------------------------------------------------------------------------------------------------------------------- Assessment and Plan  Well adult exam Well adult Orders Placed This Encounter  Procedures   Lipid Panel w/reflex Direct LDL   TSH   Vitamin D (25 hydroxy)  Screenings: Per lab orders Immunizations:  She will check with her oncologist regarding Shingrix. Anticipatory guidance/Risk factor reduction:  Recommendations per AVS.    No orders of the defined types were placed in this encounter.   No follow-ups on file.    This visit occurred during the SARS-CoV-2 public health emergency.  Safety protocols were in place, including screening questions prior to the visit, additional usage of staff PPE, and extensive cleaning of exam room while observing appropriate contact time as indicated for disinfecting solutions.

## 2023-03-03 NOTE — Assessment & Plan Note (Signed)
Well adult Orders Placed This Encounter  Procedures   Lipid Panel w/reflex Direct LDL   TSH   Vitamin D (25 hydroxy)  Screenings: Per lab orders Immunizations:  She will check with her oncologist regarding Shingrix. Anticipatory guidance/Risk factor reduction:  Recommendations per AVS.

## 2023-03-03 NOTE — Patient Instructions (Signed)
Preventive Care 40-60 Years Old, Female Preventive care refers to lifestyle choices and visits with your health care provider that can promote health and wellness. Preventive care visits are also called wellness exams. What can I expect for my preventive care visit? Counseling Your health care provider may ask you questions about your: Medical history, including: Past medical problems. Family medical history. Pregnancy history. Current health, including: Menstrual cycle. Method of birth control. Emotional well-being. Home life and relationship well-being. Sexual activity and sexual health. Lifestyle, including: Alcohol, nicotine or tobacco, and drug use. Access to firearms. Diet, exercise, and sleep habits. Work and work environment. Sunscreen use. Safety issues such as seatbelt and bike helmet use. Physical exam Your health care provider will check your: Height and weight. These may be used to calculate your BMI (body mass index). BMI is a measurement that tells if you are at a healthy weight. Waist circumference. This measures the distance around your waistline. This measurement also tells if you are at a healthy weight and may help predict your risk of certain diseases, such as type 2 diabetes and high blood pressure. Heart rate and blood pressure. Body temperature. Skin for abnormal spots. What immunizations do I need?  Vaccines are usually given at various ages, according to a schedule. Your health care provider will recommend vaccines for you based on your age, medical history, and lifestyle or other factors, such as travel or where you work. What tests do I need? Screening Your health care provider may recommend screening tests for certain conditions. This may include: Lipid and cholesterol levels. Diabetes screening. This is done by checking your blood sugar (glucose) after you have not eaten for a while (fasting). Pelvic exam and Pap test. Hepatitis B test. Hepatitis C  test. HIV (human immunodeficiency virus) test. STI (sexually transmitted infection) testing, if you are at risk. Lung cancer screening. Colorectal cancer screening. Mammogram. Talk with your health care provider about when you should start having regular mammograms. This may depend on whether you have a family history of breast cancer. BRCA-related cancer screening. This may be done if you have a family history of breast, ovarian, tubal, or peritoneal cancers. Bone density scan. This is done to screen for osteoporosis. Talk with your health care provider about your test results, treatment options, and if necessary, the need for more tests. Follow these instructions at home: Eating and drinking  Eat a diet that includes fresh fruits and vegetables, whole grains, lean protein, and low-fat dairy products. Take vitamin and mineral supplements as recommended by your health care provider. Do not drink alcohol if: Your health care provider tells you not to drink. You are pregnant, may be pregnant, or are planning to become pregnant. If you drink alcohol: Limit how much you have to 0-1 drink a day. Know how much alcohol is in your drink. In the [place] one drink equals one 12 oz bottle of beer (355 mL), one 5 oz glass of wine (148 mL), or one 1 oz glass of hard liquor (44 mL). Lifestyle Brush your teeth every morning and night with fluoride toothpaste. Floss one time each day. Exercise for at least 30 minutes 5 or more days each week. Do not use any products that contain nicotine or tobacco. These products include cigarettes, chewing tobacco, and vaping devices, such as e-cigarettes. If you need help quitting, ask your health care provider. Do not use drugs. If you are sexually active, practice safe sex. Use a condom or other form of protection to   prevent STIs. If you do not wish to become pregnant, use a form of birth control. If you plan to become pregnant, see your health care provider for a  prepregnancy visit. Take aspirin only as told by your health care provider. Make sure that you understand how much to take and what form to take. Work with your health care provider to find out whether it is safe and beneficial for you to take aspirin daily. Find healthy ways to manage stress, such as: Meditation, yoga, or listening to music. Journaling. Talking to a trusted person. Spending time with friends and family. Minimize exposure to UV radiation to reduce your risk of skin cancer. Safety Always wear your seat belt while driving or riding in a vehicle. Do not drive: If you have been drinking alcohol. Do not ride with someone who has been drinking. When you are tired or distracted. While texting. If you have been using any mind-altering substances or drugs. Wear a helmet and other protective equipment during sports activities. If you have firearms in your house, make sure you follow all gun safety procedures. Seek help if you have been physically or sexually abused. What's next? Visit your health care provider once a year for an annual wellness visit. Ask your health care provider how often you should have your eyes and teeth checked. Stay up to date on all vaccines. This information is not intended to replace advice given to you by your health care provider. Make sure you discuss any questions you have with your health care provider. Document Revised: 04/01/2021 Document Reviewed: 04/01/2021 Elsevier Patient Education  2023 Elsevier Inc.  

## 2023-03-04 ENCOUNTER — Other Ambulatory Visit: Payer: Self-pay | Admitting: Adult Health

## 2023-03-04 DIAGNOSIS — R109 Unspecified abdominal pain: Secondary | ICD-10-CM

## 2023-03-14 ENCOUNTER — Other Ambulatory Visit: Payer: Self-pay | Admitting: Hematology and Oncology

## 2023-03-18 MED FILL — Dexamethasone Sodium Phosphate Inj 100 MG/10ML: INTRAMUSCULAR | Qty: 1 | Status: AC

## 2023-03-18 MED FILL — Fosaprepitant Dimeglumine For IV Infusion 150 MG (Base Eq): INTRAVENOUS | Qty: 5 | Status: AC

## 2023-03-21 ENCOUNTER — Inpatient Hospital Stay (HOSPITAL_BASED_OUTPATIENT_CLINIC_OR_DEPARTMENT_OTHER): Payer: 59 | Admitting: Adult Health

## 2023-03-21 ENCOUNTER — Encounter: Payer: Self-pay | Admitting: Adult Health

## 2023-03-21 ENCOUNTER — Inpatient Hospital Stay: Payer: 59 | Attending: Hematology and Oncology

## 2023-03-21 ENCOUNTER — Inpatient Hospital Stay: Payer: 59

## 2023-03-21 VITALS — BP 127/80 | HR 74 | Temp 97.3°F | Resp 16 | Ht 63.0 in | Wt 146.2 lb

## 2023-03-21 VITALS — BP 102/68 | HR 66

## 2023-03-21 DIAGNOSIS — Z9221 Personal history of antineoplastic chemotherapy: Secondary | ICD-10-CM | POA: Insufficient documentation

## 2023-03-21 DIAGNOSIS — E559 Vitamin D deficiency, unspecified: Secondary | ICD-10-CM | POA: Diagnosis not present

## 2023-03-21 DIAGNOSIS — C50111 Malignant neoplasm of central portion of right female breast: Secondary | ICD-10-CM | POA: Diagnosis not present

## 2023-03-21 DIAGNOSIS — Z923 Personal history of irradiation: Secondary | ICD-10-CM | POA: Diagnosis not present

## 2023-03-21 DIAGNOSIS — Z13220 Encounter for screening for lipoid disorders: Secondary | ICD-10-CM

## 2023-03-21 DIAGNOSIS — Z87891 Personal history of nicotine dependence: Secondary | ICD-10-CM | POA: Insufficient documentation

## 2023-03-21 DIAGNOSIS — Z5112 Encounter for antineoplastic immunotherapy: Secondary | ICD-10-CM | POA: Insufficient documentation

## 2023-03-21 DIAGNOSIS — Z79899 Other long term (current) drug therapy: Secondary | ICD-10-CM | POA: Insufficient documentation

## 2023-03-21 DIAGNOSIS — C7800 Secondary malignant neoplasm of unspecified lung: Secondary | ICD-10-CM | POA: Insufficient documentation

## 2023-03-21 DIAGNOSIS — Z171 Estrogen receptor negative status [ER-]: Secondary | ICD-10-CM | POA: Insufficient documentation

## 2023-03-21 DIAGNOSIS — Z1329 Encounter for screening for other suspected endocrine disorder: Secondary | ICD-10-CM

## 2023-03-21 DIAGNOSIS — C787 Secondary malignant neoplasm of liver and intrahepatic bile duct: Secondary | ICD-10-CM | POA: Diagnosis not present

## 2023-03-21 DIAGNOSIS — Z95828 Presence of other vascular implants and grafts: Secondary | ICD-10-CM

## 2023-03-21 LAB — CMP (CANCER CENTER ONLY)
ALT: 43 U/L (ref 0–44)
AST: 28 U/L (ref 15–41)
Albumin: 4 g/dL (ref 3.5–5.0)
Alkaline Phosphatase: 87 U/L (ref 38–126)
Anion gap: 6 (ref 5–15)
BUN: 9 mg/dL (ref 6–20)
CO2: 25 mmol/L (ref 22–32)
Calcium: 9 mg/dL (ref 8.9–10.3)
Chloride: 108 mmol/L (ref 98–111)
Creatinine: 0.64 mg/dL (ref 0.44–1.00)
GFR, Estimated: >60 mL/min (ref >60)
Glucose, Bld: 99 mg/dL (ref 70–99)
Potassium: 4 mmol/L (ref 3.5–5.1)
Sodium: 139 mmol/L (ref 135–145)
Total Bilirubin: 0.3 mg/dL (ref 0.3–1.2)
Total Protein: 6.8 g/dL (ref 6.5–8.1)

## 2023-03-21 LAB — CBC WITH DIFFERENTIAL (CANCER CENTER ONLY)
Abs Immature Granulocytes: 0.02 10*3/uL (ref 0.00–0.07)
Basophils Absolute: 0.1 10*3/uL (ref 0.0–0.1)
Basophils Relative: 1 %
Eosinophils Absolute: 0.2 10*3/uL (ref 0.0–0.5)
Eosinophils Relative: 4 %
HCT: 35.6 % — ABNORMAL LOW (ref 36.0–46.0)
Hemoglobin: 12 g/dL (ref 12.0–15.0)
Immature Granulocytes: 0 %
Lymphocytes Relative: 34 %
Lymphs Abs: 1.8 10*3/uL (ref 0.7–4.0)
MCH: 30.6 pg (ref 26.0–34.0)
MCHC: 33.7 g/dL (ref 30.0–36.0)
MCV: 90.8 fL (ref 80.0–100.0)
Monocytes Absolute: 0.6 10*3/uL (ref 0.1–1.0)
Monocytes Relative: 12 %
Neutro Abs: 2.6 10*3/uL (ref 1.7–7.7)
Neutrophils Relative %: 49 %
Platelet Count: 311 10*3/uL (ref 150–400)
RBC: 3.92 MIL/uL (ref 3.87–5.11)
RDW: 13.9 % (ref 11.5–15.5)
WBC Count: 5.2 10*3/uL (ref 4.0–10.5)
nRBC: 0 % (ref 0.0–0.2)

## 2023-03-21 MED ORDER — ACETAMINOPHEN 325 MG PO TABS
650.0000 mg | ORAL_TABLET | Freq: Once | ORAL | Status: AC
  Administered 2023-03-21: 650 mg via ORAL
  Filled 2023-03-21: qty 2

## 2023-03-21 MED ORDER — DIPHENHYDRAMINE HCL 25 MG PO CAPS
25.0000 mg | ORAL_CAPSULE | Freq: Once | ORAL | Status: AC
  Administered 2023-03-21: 25 mg via ORAL
  Filled 2023-03-21: qty 1

## 2023-03-21 MED ORDER — DEXTROSE 5 % IV SOLN: Freq: Once | INTRAVENOUS | Status: AC

## 2023-03-21 MED ORDER — HEPARIN SOD (PORK) LOCK FLUSH 100 UNIT/ML IV SOLN
500.0000 [IU] | Freq: Once | INTRAVENOUS | Status: AC | PRN
  Administered 2023-03-21: 500 [IU]

## 2023-03-21 MED ORDER — SODIUM CHLORIDE 0.9% FLUSH
10.0000 mL | INTRAVENOUS | Status: DC | PRN
  Administered 2023-03-21: 10 mL

## 2023-03-21 MED ORDER — SODIUM CHLORIDE 0.9 % IV SOLN
150.0000 mg | Freq: Once | INTRAVENOUS | Status: AC
  Administered 2023-03-21: 150 mg via INTRAVENOUS
  Filled 2023-03-21: qty 150

## 2023-03-21 MED ORDER — SODIUM CHLORIDE 0.9% FLUSH
10.0000 mL | Freq: Once | INTRAVENOUS | Status: AC
  Administered 2023-03-21: 10 mL

## 2023-03-21 MED ORDER — SODIUM CHLORIDE 0.9 % IV SOLN
10.0000 mg | Freq: Once | INTRAVENOUS | Status: AC
  Administered 2023-03-21: 10 mg via INTRAVENOUS
  Filled 2023-03-21: qty 10

## 2023-03-21 MED ORDER — FAM-TRASTUZUMAB DERUXTECAN-NXKI CHEMO 100 MG IV SOLR
5.4000 mg/kg | Freq: Once | INTRAVENOUS | Status: AC
  Administered 2023-03-21: 400 mg via INTRAVENOUS
  Filled 2023-03-21: qty 20

## 2023-03-21 MED ORDER — PALONOSETRON HCL INJECTION 0.25 MG/5ML
0.2500 mg | Freq: Once | INTRAVENOUS | Status: AC
  Administered 2023-03-21: 0.25 mg via INTRAVENOUS
  Filled 2023-03-21: qty 5

## 2023-03-21 NOTE — Patient Instructions (Signed)
[facility]  Discharge Instructions: Thank you for choosing [facility] to provide your oncology and hematology care.   If you have a lab appointment with the Cancer Center, please go directly to the Cancer Center and check in at the registration area.   Wear comfortable clothing and clothing appropriate for easy access to any Portacath or PICC line.   We strive to give you quality time with your provider. You may need to reschedule your appointment if you arrive late (15 or more minutes).  Arriving late affects you and other patients whose appointments are after yours.  Also, if you miss three or more appointments without notifying the office, you may be dismissed from the clinic at the provider's discretion.      For prescription refill requests, have your pharmacy contact our office and allow 72 hours for refills to be completed.    Today you received the following chemotherapy and/or immunotherapy agents Enhertu.   To help prevent nausea and vomiting after your treatment, we encourage you to take your nausea medication as directed.  BELOW ARE SYMPTOMS THAT SHOULD BE REPORTED IMMEDIATELY: *FEVER GREATER THAN 100.4 F (38 C) OR HIGHER *CHILLS OR SWEATING *NAUSEA AND VOMITING THAT IS NOT CONTROLLED WITH YOUR NAUSEA MEDICATION *UNUSUAL SHORTNESS OF BREATH *UNUSUAL BRUISING OR BLEEDING *URINARY PROBLEMS (pain or burning when urinating, or frequent urination) *BOWEL PROBLEMS (unusual diarrhea, constipation, pain near the anus) TENDERNESS IN MOUTH AND THROAT WITH OR WITHOUT PRESENCE OF ULCERS (sore throat, sores in mouth, or a toothache) UNUSUAL RASH, SWELLING OR PAIN  UNUSUAL VAGINAL DISCHARGE OR ITCHING   Items with * indicate a potential emergency and should be followed up as soon as possible or go to the Emergency Department if any problems should occur.  Please show the CHEMOTHERAPY ALERT CARD or IMMUNOTHERAPY ALERT CARD at check-in  to the Emergency Department and triage nurse.  Should you have questions after your visit or need to cancel or reschedule your appointment, please contact [facility]  Dept: [phone]  and follow the prompts.  Office hours are 8:00 a.m. to 4:30 p.m. Monday - Friday. Please note that voicemails left after 4:00 p.m. may not be returned until the following business day.  We are closed weekends and major holidays. You have access to a nurse at all times for urgent questions. Please call the main number to the clinic Dept: [phone] and follow the prompts.   For any non-urgent questions, you may also contact your provider using MyChart. We now offer e-Visits for anyone 18 and older to request care online for non-urgent symptoms. For details visit [email or web address].   Also download the MyChart app! Go to the app store, search "MyChart", open the app, select [facility], and log in with your MyChart username and password.   

## 2023-03-21 NOTE — Assessment & Plan Note (Addendum)
Metastatic breast cancer currently on treatment with Enhertu   01/13/23: Liver Biopsy: Met breast cancer ER 0%, PR 0%, Ki67 50%, HER2 3+   Current treatment: Enhertu cycle 3 Chemo toxicities: Mild nausea which resolved with Compazine. Abdominal discomfort: resolved At risk for heart failure: most recent echocardiogram occurred 01/24/2023 and was normal. Repeat due in 04/2023; orders placed today Fasting lab request from Dr. Ashley Royalty: I placed orders for these to occur next week.   Stage IV breast cancer monitoring: No clinical signs of cancer progression, repeat imaging after her fourth cycle of therapy; orders placed today along with f/u with Dr. Pamelia Hoit to review the results the week after imaging completion.    Return to clinic in 3 weeks for cycle 4

## 2023-03-21 NOTE — Progress Notes (Signed)
Will continue enHertu dose to 400 mg.  Demetrius Charity, PharmD

## 2023-03-21 NOTE — Progress Notes (Signed)
[facility] Cancer Follow up:    Everrett Coombe, DO [address]  [address] [place] [address]   DIAGNOSIS:  Cancer Staging  Malignant neoplasm of central portion of right breast in female, estrogen receptor negative (HCC) Staging form: Breast, AJCC 6th Edition - Pathologic stage from 01/04/2012: Stage I (T1c, N0, M0) - Signed by [name], NP on 10/05/2022 Histologic grade (G): G1 Staging form: Breast, AJCC 8th Edition - Pathologic stage from 06/18/2021: Stage IA (pT1c, pN0, cM0, G3, ER-, PR-, HER2+) - Signed by [name], NP on 10/05/2022 Stage prefix: Initial diagnosis Histologic grading system: 3 grade system - Pathologic stage from 01/13/2023: Stage IV (pM1, G3, ER-, PR-, HER2+) - Signed by [name], NP on 02/07/2023 Histologic grading system: 3 grade system   SUMMARY OF ONCOLOGIC HISTORY: Oncology History  Malignant neoplasm of central portion of right breast in female, estrogen receptor negative (HCC)  01/04/2012 Initial Diagnosis   Right breast cancer: Stage Ia ER/PR positive HER2 negative status postlumpectomy, radiation (low risk Oncotype) could not tolerate tamoxifen for more than 30 days.   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  Biopsy revealed grade 3 IDC ER/PR negative HER2 positive with a Ki-67 of 30%   06/18/2021 Surgery   Right lumpectomy: Grade 3 IDC 1.5 cm, high-grade DCIS, margins negative, 0/2 lymph nodes negative, ER 0%, PR 0%, HER2 3+ positive, Ki-67 30%    Genetic  Testing   Negative genetic testing. No pathogenic variants identified on the [facility] Multi-Cancer Panel+RNA. VUS in POLD1 identified called c.2429C>T. The report date is 07/01/2021.  The Multi-Cancer Panel + RNA offered by [facility] includes sequencing and/or deletion duplication testing of the following 84 genes: AIP, ALK, APC, ATM, AXIN2,BAP1,  BARD1, BLM, BMPR1A, BRCA1, BRCA2, BRIP1, CASR, CDC73, CDH1, CDK4, CDKN1B, CDKN1C, CDKN2A (p14ARF), CDKN2A (p16INK4a), CEBPA, CHEK2, CTNNA1, DICER1, DIS3L2, EGFR (c.2369C>T, p.Thr790Met variant only), EPCAM (Deletion/duplication testing only), FH, FLCN, GATA2, GPC3, GREM1 (Promoter region deletion/duplication testing only), HOXB13 (c.251G>A, p.Gly84Glu), HRAS, KIT, MAX, MEN1, MET, MITF (c.952G>A, p.Glu318Lys variant only), MLH1, MSH2, MSH3, MSH6, MUTYH, NBN, NF1, NF2, NTHL1, PALB2, PDGFRA, PHOX2B, PMS2, POLD1, POLE, POT1, PRKAR1A, PTCH1, PTEN, RAD50, RAD51C, RAD51D, RB1, RECQL4, RET, RUNX1, SDHAF2, SDHA (sequence changes only), SDHB, SDHC, SDHD, SMAD4, SMARCA4, SMARCB1, SMARCE1, STK11, SUFU, TERC, TERT, TMEM127, TP53, TSC1, TSC2, VHL, WRN and WT1.   06/18/2021 Cancer Staging   Staging form: Breast, AJCC 8th Edition - Pathologic stage from 06/18/2021: Stage IA (pT1c, pN0, cM0, G3, ER-, PR-, HER2+) - Signed by [name], NP on 10/05/2022 Stage prefix: Initial diagnosis Histologic grading system: 3 grade system   07/21/2021 - 07/06/2022 Chemotherapy   Patient is on Treatment Plan : BREAST Paclitaxel + Trastuzumab q7d / Trastuzumab q21d     11/25/2021 - 01/04/2022 Radiation Therapy   Site Technique Total Dose (Gy) Dose per Fx (Gy) Completed Fx Beam Energies  Breast, Right: Breast_R 3D 45/45 1.8 25/25 6XFFF  Breast, Right: Breast_R_Bst specialPort 5.4/5.4 1.8 3/3 12E     12/29/2022 Progression  CT chest on 11/01/2022: Right upper lobe nodules largest measuring 5 mm CT chest 12/29/2022: Upper and mid lung zone predominant pulmonary nodules are worrisome for  metastatic disease.  Left and right hepatic lobe masses (2.5 cm and 3.9 cm) are new and are worrisome for metastatic disease.   01/13/2023 Initial Biopsy   Right liver biopsy: Metastatic carcinoma with breast primary, ER-, PR-, HER2 +, Ki-67 50%.    01/13/2023 Cancer Staging   Staging form: Breast, AJCC 8th Edition - Pathologic stage from 01/13/2023: Stage IV (pM1, G3, ER-, PR-, HER2+) - Signed by [name], NP on 02/07/2023 Histologic grading system: 3 grade system   01/17/2023 PET scan   PET CT scan 01/17/2023: Hypermetabolic bilobar hepatic (2 masses measuring 2.8 cm SUV 11.1, another mass 4.1 cm SUV 10.3) and pulmonary metastases (right upper lobe lung nodule 5 mm SUV 2.6 right lower lobe 6 mm nodule SUV 1.8)    02/07/2023 -  Chemotherapy   Patient is on Treatment Plan : BREAST METASTATIC Fam-Trastuzumab Deruxtecan-nxki (Enhertu) (5.4) q21d       CURRENT THERAPY: Enhertu  INTERVAL HISTORY: EMJAY MONTIETH 62 y.o. female returns for f/u before receiving Enhertu.  She tells me she is tolerating her treatment well.  She denies any new concerns today.  Her only request today is to receive a new chemo card because she has previously thrown hers away.    Her most recent echocardiogram occurred on January 24, 2023 demonstrating a normal left ventricular ejection fraction of 57%.  He also noted unilateral leg swelling on February 07, 2023.  A Doppler that was completed on April 23 did not demonstrate a DVT.   Patient Active Problem List   Diagnosis Date Noted   Well adult exam 03/03/2023   Metastases to the liver [facility]) 02/07/2023   Cancer with pulmonary metastases (HCC) 02/07/2023   Cellulitis of finger of right hand 05/12/2022   Eustachian tube dysfunction, right 08/09/2021   Port-A-Cath in place 07/21/2021   Genetic testing 07/02/2021   Hx of basal cell carcinoma 05/26/2021   Family history of breast cancer 05/26/2021   Family history of ovarian cancer 05/26/2021   Family  history of pancreatic cancer 05/26/2021   Family history of colon cancer 05/26/2021   Family history of melanoma 05/26/2021   Breast lump in upper inner quadrant 04/30/2021   Post herpetic neuralgia 01/29/2021   Seizures (HCC) 01/01/2021   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  No sign of residual dz at 04/2018 rad onc f/u and 10/2018 neuro-onc f/u. 03/2019 MRI brain->no resid/no recurrence.   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   Chicken pox    Depression    zoloft in past--?wt gain.   Diplopia    chronic (meningioma-related)   Eustachian tube dysfunction 09/05/2013   Family history of breast cancer    Family history of colon cancer    Family history of melanoma    Family history of ovarian cancer  Family history of pancreatic cancer    GERD (gastroesophageal reflux disease)    History of radiation therapy 02/24/12-04/11/12   right breast/ 45Gy@1 .8Gyx2fx/boost=16Gy@2  Gya76fx.  Latest mammo and u/s 03/2013--normal.   History of radiation therapy 11/30/17- 01/11/18   Right temporal lobe treated to 55.8 Gy with 31 fx of 1.8 Gy   Hx of basal cell carcinoma    Migraine    Palpitations    has taken Metoprolol for palpitations in the past.   Seizure (HCC)    2 YEARS AGO,UPDATED 09/07/22   Taste impairment    s/p radiation therapy   Tobacco dependence 09/05/2013    SURGICAL HISTORY: Past Surgical History:  Procedure Laterality Date   APPENDECTOMY  2008   emergency   BRAIN TUMOR EXCISION  2018   Meningioma    BREAST LUMPECTOMY Right 01/04/2012   right breast   BREAST LUMPECTOMY WITH RADIOACTIVE SEED AND SENTINEL LYMPH NODE BIOPSY Right 06/18/2021   Procedure: RIGHT BREAST LUMPECTOMY WITH RADIOACTIVE SEED AND SENTINEL LYMPH NODE BIOPSY;  Surgeon: Harriette Bouillon, MD;  Location: [facility];  Service: General;  Laterality: Right;   COLONOSCOPY     2014 ?   CRANIECTOMY  10/19/2017   at [facility] hospital   IR IMAGING GUIDED PORT INSERTION  02/02/2023   PORTACATH PLACEMENT Right 06/18/2021   Procedure: INSERTION PORT-A-CATH;  Surgeon: Harriette Bouillon, MD;  Location: [facility];  Service: General;  Laterality: Right;   PORTACATH REMOVED     OCTOBER 10,2023   WISDOM TOOTH EXTRACTION  1990    SOCIAL HISTORY: Social History   Socioeconomic History   Marital status: Single    Spouse name: Not on file   Number of children: Not on file   Years of education: Not on file   Highest education level: Not on file  Occupational History   Not on file  Tobacco Use   Smoking status: Former    Packs/day: .5    Types: Cigarettes    Quit date: 10/17/2017    Years since quitting: 5.4    Passive exposure: Never   Smokeless tobacco: Never  Vaping Use   Vaping Use: Former  Substance and Sexual Activity   Alcohol use: Yes    Alcohol/week: 3.0 standard drinks of alcohol    Types: 3 Cans of beer per week    Comment: 3-4 beers/day   Drug use: Not Currently   Sexual activity: Not Currently    Partners: Male  Other Topics Concern   Not on file  Social History Narrative   Marital status/children/pets: Single.  No children.  Lives alone.  Has pets.Orig from [place] in [place].   Education/employment: Bachelor's degree, retired   Field seismologist:      -smoke alarm in the home:Yes     - wears seatbelt: Yes               Social Determinants of Health   Financial Resource Strain: Not on file  Food Insecurity: Not on file  Transportation Needs: Not on file  Physical Activity: Not on file   Stress: Not on file  Social Connections: Not on file  Intimate Partner Violence: Not on file    FAMILY HISTORY: Family History  Problem Relation Age of Onset   Anesthesia problems Mother    Breast cancer Mother 28       lumpectomy, chemo, radiation   Colon polyps Brother    Ovarian cancer Maternal Aunt 50   Cancer Maternal Aunt  unknown type   Breast cancer Maternal Aunt 80   Breast cancer Maternal Aunt 40       bilateral   Pancreatic cancer Maternal Uncle        dx 34s   Stomach cancer Paternal Uncle    Cancer Paternal Uncle        unknown type   Lung cancer Paternal Uncle        hx smoking   Dementia Maternal Grandmother    Lung cancer Maternal Grandfather        hx smoking   Pancreatic cancer Paternal Grandmother 4   Melanoma Paternal Grandmother        dx >50, shin   Heart Problems Paternal Grandfather    Colon cancer Cousin 58       maternal first cousin   Breast cancer Cousin        dx 38s, paternal first cousin   Crohn's disease Neg Hx    Esophageal cancer Neg Hx    Rectal cancer Neg Hx    Ulcerative colitis Neg Hx     Review of Systems  Constitutional:  Negative for appetite change, chills, fatigue, fever and unexpected weight change.       Notes a decreased appetite  HENT:   Negative for hearing loss, lump/mass and trouble swallowing.   Eyes:  Negative for eye problems and icterus.  Respiratory:  Negative for chest tightness, cough and shortness of breath.   Cardiovascular:  Negative for chest pain, leg swelling and palpitations.  Gastrointestinal:  Negative for abdominal distention, abdominal pain, constipation, diarrhea, nausea and vomiting.  Endocrine: Negative for hot flashes.  Genitourinary:  Negative for difficulty urinating.   Musculoskeletal:  Negative for arthralgias.  Skin:  Negative for itching and rash.  Neurological:  Negative for dizziness, extremity weakness, headaches and numbness.  Hematological:  Negative for adenopathy. Does  not bruise/bleed easily.  Psychiatric/Behavioral:  Negative for depression. The patient is not nervous/anxious.       PHYSICAL EXAMINATION   Onc Performance Status - 03/21/23 1005       ECOG Perf Status   ECOG Perf Status Restricted in physically strenuous activity but ambulatory and able to carry out work of a light or sedentary nature, e.g., light house work, office work      KPS SCALE   KPS % SCORE Able to carry on normal activity, minor s/s of disease             Vitals:   03/21/23 1002  BP: 127/80  Pulse: 74  Resp: 16  Temp: (!) 97.3 F (36.3 C)  SpO2: 100%   Patient appears well.  She is in no apparent distress.  Speech slightly rushed.    LABORATORY DATA:  CBC    Component Value Date/Time   WBC 5.2 03/21/2023 0944   WBC 7.0 01/13/2023 1200   RBC 3.92 03/21/2023 0944   HGB 12.0 03/21/2023 0944   HGB 11.8 12/08/2011 0819   HCT 35.6 (L) 03/21/2023 0944   HCT 34.9 12/08/2011 0819   PLT 311 03/21/2023 0944   PLT 314 12/08/2011 0819   MCV 90.8 03/21/2023 0944   MCV 88.3 12/08/2011 0819   MCH 30.6 03/21/2023 0944   MCHC 33.7 03/21/2023 0944   RDW 13.9 03/21/2023 0944   RDW 14.0 12/08/2011 0819   LYMPHSABS 1.8 03/21/2023 0944   LYMPHSABS 2.2 12/08/2011 0819   MONOABS 0.6 03/21/2023 0944   MONOABS 0.8 12/08/2011 0819   EOSABS 0.2 03/21/2023 0944  EOSABS 0.2 12/08/2011 0819   BASOSABS 0.1 03/21/2023 0944   BASOSABS 0.1 12/08/2011 0819    CMP     Component Value Date/Time   NA 139 03/21/2023 0944   NA 140 11/23/2019 0000   K 4.0 03/21/2023 0944   CL 108 03/21/2023 0944   CO2 25 03/21/2023 0944   GLUCOSE 99 03/21/2023 0944   BUN 9 03/21/2023 0944   BUN 9 11/23/2019 0000   CREATININE 0.64 03/21/2023 0944   CREATININE 0.64 09/18/2013 0900   CALCIUM 9.0 03/21/2023 0944   PROT 6.8 03/21/2023 0944   ALBUMIN 4.0 03/21/2023 0944   AST 28 03/21/2023 0944   ALT 43 03/21/2023 0944   ALKPHOS 87 03/21/2023 0944   BILITOT 0.3 03/21/2023 0944    GFRNONAA >60 03/21/2023 0944   GFRAA 113 11/23/2019 0000       ASSESSMENT and THERAPY PLAN:   Malignant neoplasm of central portion of right breast in female, estrogen receptor negative (HCC) Metastatic breast cancer currently on treatment with Enhertu   01/13/23: Liver Biopsy: Met breast cancer ER 0%, PR 0%, Ki67 50%, HER2 3+   Current treatment: Enhertu cycle 3 Chemo toxicities: Mild nausea which resolved with Compazine. Abdominal discomfort: resolved At risk for heart failure: most recent echocardiogram occurred 01/24/2023 and was normal. Repeat due in 04/2023; orders placed today Fasting lab request from Dr. Ashley Royalty: I placed orders for these to occur next week.   Stage IV breast cancer monitoring: No clinical signs of cancer progression, repeat imaging after her fourth cycle of therapy; orders placed today along with f/u with Dr. Pamelia Hoit to review the results the week after imaging completion.    Return to clinic in 3 weeks for cycle 4    All questions were answered. The patient knows to call the clinic with any problems, questions or concerns. We can certainly see the patient much sooner if necessary.  Total encounter time:20 minutes*in face-to-face visit time, chart review, lab review, care coordination, order entry, and documentation of the encounter time.    Lillard Anes, NP 03/21/23 11:16 AM Medical Oncology and Hematology [facility] [address] [place], [place] [address] Tel. [phone]    Fax. [phone]  *Total Encounter Time as defined by the Centers for Medicare and Medicaid Services includes, in addition to the face-to-face time of a patient visit (documented in the note above) non-face-to-face time: obtaining and reviewing outside history, ordering and reviewing medications, tests or procedures, care coordination (communications with other health care professionals or caregivers) and documentation in the medical record.

## 2023-03-23 ENCOUNTER — Telehealth: Payer: Self-pay | Admitting: Hematology and Oncology

## 2023-03-23 NOTE — Telephone Encounter (Signed)
Scheduled appointment per los. Patient is aware. 

## 2023-04-04 ENCOUNTER — Encounter: Payer: Self-pay | Admitting: Sports Medicine

## 2023-04-04 ENCOUNTER — Ambulatory Visit (INDEPENDENT_AMBULATORY_CARE_PROVIDER_SITE_OTHER): Payer: 59 | Admitting: Sports Medicine

## 2023-04-04 VITALS — BP 127/90 | HR 100

## 2023-04-04 DIAGNOSIS — W57XXXA Bitten or stung by nonvenomous insect and other nonvenomous arthropods, initial encounter: Secondary | ICD-10-CM

## 2023-04-04 DIAGNOSIS — L03115 Cellulitis of right lower limb: Secondary | ICD-10-CM

## 2023-04-04 MED ORDER — DOXYCYCLINE HYCLATE 100 MG PO TABS
100.0000 mg | ORAL_TABLET | Freq: Two times a day (BID) | ORAL | 0 refills | Status: AC
Start: 2023-04-04 — End: 2023-04-11

## 2023-04-04 NOTE — Assessment & Plan Note (Signed)
Pleasant 62 year old female, on Tuesday approximately 6 days ago she had a tick bite, the tick was not attached for more than a few minutes, she immediately removed it. There appeared to be a deer tick per her report. She has a bit of erythema and tenderness at the site of the tick bite but it looks like she got all of the tick components out. No new fevers, chills, she is on chemotherapy. Due to her immunocompromise status we will treat her aggressively, adding doxycycline, she will let me know if she develops any rash or achiness out of the ordinary.

## 2023-04-04 NOTE — Progress Notes (Signed)
Tick bite   Procedures performed today:    None.  Independent interpretation of notes and tests performed by another provider:   None.  Brief History, Exam, Impression, and Recommendations:    Tick bite right posterior thigh Pleasant 62 year old female, on Tuesday approximately 6 days ago she had a tick bite, the tick was not attached for more than a few minutes, she immediately removed it. There appeared to be a deer tick per her report. She has a bit of erythema and tenderness at the site of the tick bite but it looks like she got all of the tick components out. No new fevers, chills, she is on chemotherapy. Due to her immunocompromise status we will treat her aggressively, adding doxycycline, she will let me know if she develops any rash or achiness out of the ordinary.    ____________________________________________ Ihor Austin. Benjamin Stain, M.D., ABFM., CAQSM., AME. Primary Care and Sports Medicine [facility]  Adjunct Professor of Family Medicine  [employer]  Restaurant manager, fast food

## 2023-04-06 NOTE — Addendum Note (Signed)
Addended by: Monica Becton on: 04/06/2023 02:24 PM   Modules accepted: Level of Service

## 2023-04-10 NOTE — Progress Notes (Signed)
Patient Care Team: Everrett Coombe, DO as PCP - General (Family Medicine) [name], MD as Consulting Physician (Radiation Oncology) Barbaraann Cao Georgeanna Lea, MD as Consulting Physician (Oncology) [facility] [place] ([facility]) Domenic Schwab, Lucianne Lei, MD as Referring Physician (Dermatology) Graylin Shiver, MD as Referring Physician (Otolaryngology) Serena Croissant, MD as Medical Oncologist (Hematology and Oncology) Harriette Bouillon, MD as Consulting Physician (General Surgery)  DIAGNOSIS: No diagnosis found.  SUMMARY OF ONCOLOGIC HISTORY: Oncology History  Malignant neoplasm of central portion of right breast in female, estrogen receptor negative (HCC)  01/04/2012 Initial Diagnosis   Right breast cancer: Stage Ia ER/PR positive HER2 negative status postlumpectomy, radiation (low risk Oncotype) could not tolerate tamoxifen for more than 30 days.   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 No pathogenic variants identified on the [facility] Multi-Cancer Panel+RNA. VUS in POLD1 identified called c.2429C>T. The report date is 07/01/2021.  The Multi-Cancer Panel + RNA offered by [facility] includes sequencing and/or deletion duplication testing of the following 84 genes: AIP, ALK,  APC, ATM, AXIN2,BAP1,  BARD1, BLM, BMPR1A, BRCA1, BRCA2, BRIP1, CASR, CDC73, CDH1, CDK4, CDKN1B, CDKN1C, CDKN2A (p14ARF), CDKN2A (p16INK4a), CEBPA, CHEK2, CTNNA1, DICER1, DIS3L2, EGFR (c.2369C>T, p.Thr790Met variant only), EPCAM (Deletion/duplication testing only), FH, FLCN, GATA2, GPC3, GREM1 (Promoter region deletion/duplication testing only), HOXB13 (c.251G>A, p.Gly84Glu), HRAS, KIT, MAX, MEN1, MET, MITF (c.952G>A, p.Glu318Lys variant only), MLH1, MSH2, MSH3, MSH6, MUTYH, NBN, NF1, NF2, NTHL1, PALB2, PDGFRA, PHOX2B, PMS2, POLD1, POLE, POT1, PRKAR1A, PTCH1, PTEN, RAD50, RAD51C, RAD51D, RB1, RECQL4, RET, RUNX1, SDHAF2, SDHA (sequence changes only), SDHB, SDHC, SDHD, SMAD4, SMARCA4, SMARCB1, SMARCE1, STK11, SUFU, TERC, TERT, TMEM127, TP53, TSC1, TSC2, VHL, WRN and WT1.   06/18/2021 Cancer Staging   Staging form: Breast, AJCC 8th Edition - Pathologic stage from 06/18/2021: Stage IA (pT1c, pN0, cM0, G3, ER-, PR-, HER2+) - Signed by [name], NP on 10/05/2022 Stage prefix: Initial diagnosis Histologic grading system: 3 grade system   07/21/2021 - 07/06/2022 Chemotherapy   Patient is on Treatment Plan : BREAST Paclitaxel + Trastuzumab q7d / Trastuzumab q21d     11/25/2021 - 01/04/2022 Radiation Therapy   Site Technique Total Dose (Gy) Dose per Fx (Gy) Completed Fx Beam Energies  Breast, Right: Breast_R 3D 45/45 1.8 25/25 6XFFF  Breast, Right: Breast_R_Bst specialPort 5.4/5.4 1.8 3/3 12E     12/29/2022 Progression   CT chest on 11/01/2022: Right upper lobe nodules largest measuring 5 mm CT chest 12/29/2022: Upper and mid lung zone predominant pulmonary nodules are worrisome for metastatic disease.  Left and right hepatic lobe masses (2.5 cm and 3.9 cm) are new and are worrisome for metastatic disease.   01/13/2023 Initial Biopsy   Right liver biopsy: Metastatic carcinoma with breast primary, ER-, PR-, HER2 +, Ki-67 50%.  01/13/2023 Cancer Staging   Staging form: Breast, AJCC 8th Edition -  Pathologic stage from 01/13/2023: Stage IV (pM1, G3, ER-, PR-, HER2+) - Signed by [name], NP on 02/07/2023 Histologic grading system: 3 grade system   01/17/2023 PET scan   PET CT scan 01/17/2023: Hypermetabolic bilobar hepatic (2 masses measuring 2.8 cm SUV 11.1, another mass 4.1 cm SUV 10.3) and pulmonary metastases (right upper lobe lung nodule 5 mm SUV 2.6 right lower lobe 6 mm nodule SUV 1.8)    02/07/2023 -  Chemotherapy   Patient is on Treatment Plan : BREAST METASTATIC Fam-Trastuzumab Deruxtecan-nxki (Enhertu) (5.4) q21d       CHIEF COMPLIANT: Enhertu cycle 3  INTERVAL HISTORY: Lori Mcmillan is a 62 y.o with the above mentioned metastatic breast cancer. Currently on Enhertu. She presents to the clinic for a follow-up.    ALLERGIES:  has No Known Allergies.  MEDICATIONS:  Current Outpatient Medications  Medication Sig Dispense Refill   acetaminophen (TYLENOL) 500 MG tablet Take 500 mg by mouth as needed.     ALPRAZolam (XANAX) 0.25 MG tablet TAKE 1 TABLET BY MOUTH TWICE A DAY AS NEEDED FOR ANXIETY 30 tablet 3   diazepam (VALIUM) 2 MG tablet Take by mouth as needed (dizziness).     doxycycline (VIBRA-TABS) 100 MG tablet Take 1 tablet (100 mg total) by mouth 2 (two) times daily for 7 days. 14 tablet 0   Famotidine-Ca Carb-Mag Hydrox (PEPCID COMPLETE PO) Take by mouth as needed.     lidocaine-prilocaine (EMLA) cream Apply to affected area once 30 g 3   ondansetron (ZOFRAN) 8 MG tablet Take 1 tablet (8 mg total) by mouth every 8 (eight) hours as needed for nausea or vomiting. Start on the third day after chemotherapy. 30 tablet 1   prochlorperazine (COMPAZINE) 10 MG tablet Take 1 tablet (10 mg total) by mouth every 6 (six) hours as needed for nausea or vomiting. 30 tablet 1   No current facility-administered medications for this visit.    PHYSICAL EXAMINATION: ECOG PERFORMANCE STATUS: {CHL ONC ECOG PS:[id number]}  There were no vitals filed for this  visit. There were no vitals filed for this visit.  BREAST:*** No palpable masses or nodules in either right or left breasts. No palpable axillary supraclavicular or infraclavicular adenopathy no breast tenderness or nipple discharge. (exam performed in the presence of a chaperone)  LABORATORY DATA:  I have reviewed the data as listed    Latest Ref Rng & Units 03/21/2023    9:44 AM 02/28/2023   11:14 AM 02/15/2023   10:58 AM  CMP  Glucose 70 - 99 mg/dL 99  94  90   BUN 6 - 20 mg/dL 9  7  8    Creatinine 0.44 - 1.00 mg/dL 8.11  9.14  7.82   Sodium 135 - 145 mmol/L 139  139  138   Potassium 3.5 - 5.1 mmol/L 4.0  4.1  3.9   Chloride 98 - 111 mmol/L 108  107  107   CO2 22 - 32 mmol/L 25  25  27    Calcium 8.9 - 10.3 mg/dL 9.0  8.7  8.9   Total Protein 6.5 - 8.1 g/dL 6.8  6.7  6.4   Total Bilirubin 0.3 - 1.2 mg/dL 0.3  0.2  0.3   Alkaline Phos 38 - 126 U/L 87  85  86   AST 15 - 41 U/L 28  31  30    ALT 0 - 44 U/L 43  46  26     Lab Results  Component Value Date   WBC 5.2 03/21/2023   HGB 12.0 03/21/2023   HCT 35.6 (L) 03/21/2023   MCV 90.8 03/21/2023   PLT 311 03/21/2023   NEUTROABS 2.6 03/21/2023    ASSESSMENT & PLAN:  No problem-specific Assessment & Plan notes found for this encounter.    No orders of the defined types were placed in this encounter.  The patient has a good understanding of the overall plan. she agrees with it. she will call with any problems that may develop before the next visit here. Total time spent: 30 mins including face to face time and time spent for planning, charting and co-ordination of care   Sherlyn Lick, CMA 04/10/23    I Janan Ridge am acting as a Neurosurgeon for [employer]  ***

## 2023-04-11 MED FILL — Dexamethasone Sodium Phosphate Inj 100 MG/10ML: INTRAMUSCULAR | Qty: 1 | Status: AC

## 2023-04-11 MED FILL — Fosaprepitant Dimeglumine For IV Infusion 150 MG (Base Eq): INTRAVENOUS | Qty: 5 | Status: AC

## 2023-04-12 ENCOUNTER — Inpatient Hospital Stay: Payer: 59

## 2023-04-12 ENCOUNTER — Other Ambulatory Visit: Payer: Self-pay

## 2023-04-12 ENCOUNTER — Inpatient Hospital Stay (HOSPITAL_BASED_OUTPATIENT_CLINIC_OR_DEPARTMENT_OTHER): Payer: 59 | Admitting: Hematology and Oncology

## 2023-04-12 VITALS — BP 120/67 | HR 71 | Resp 17

## 2023-04-12 VITALS — BP 132/76 | HR 76 | Temp 97.3°F | Resp 18 | Ht 63.0 in | Wt 145.6 lb

## 2023-04-12 DIAGNOSIS — C50111 Malignant neoplasm of central portion of right female breast: Secondary | ICD-10-CM

## 2023-04-12 DIAGNOSIS — Z1329 Encounter for screening for other suspected endocrine disorder: Secondary | ICD-10-CM

## 2023-04-12 DIAGNOSIS — Z95828 Presence of other vascular implants and grafts: Secondary | ICD-10-CM

## 2023-04-12 DIAGNOSIS — Z87891 Personal history of nicotine dependence: Secondary | ICD-10-CM | POA: Diagnosis not present

## 2023-04-12 DIAGNOSIS — Z79899 Other long term (current) drug therapy: Secondary | ICD-10-CM | POA: Diagnosis not present

## 2023-04-12 DIAGNOSIS — E559 Vitamin D deficiency, unspecified: Secondary | ICD-10-CM | POA: Diagnosis not present

## 2023-04-12 DIAGNOSIS — Z923 Personal history of irradiation: Secondary | ICD-10-CM | POA: Diagnosis not present

## 2023-04-12 DIAGNOSIS — Z13220 Encounter for screening for lipoid disorders: Secondary | ICD-10-CM

## 2023-04-12 DIAGNOSIS — Z5112 Encounter for antineoplastic immunotherapy: Secondary | ICD-10-CM | POA: Diagnosis not present

## 2023-04-12 DIAGNOSIS — Z171 Estrogen receptor negative status [ER-]: Secondary | ICD-10-CM

## 2023-04-12 DIAGNOSIS — C787 Secondary malignant neoplasm of liver and intrahepatic bile duct: Secondary | ICD-10-CM | POA: Diagnosis not present

## 2023-04-12 DIAGNOSIS — Z9221 Personal history of antineoplastic chemotherapy: Secondary | ICD-10-CM | POA: Diagnosis not present

## 2023-04-12 DIAGNOSIS — C7800 Secondary malignant neoplasm of unspecified lung: Secondary | ICD-10-CM | POA: Diagnosis not present

## 2023-04-12 LAB — CMP (CANCER CENTER ONLY)
ALT: 39 U/L (ref 0–44)
AST: 32 U/L (ref 15–41)
Albumin: 3.8 g/dL (ref 3.5–5.0)
Alkaline Phosphatase: 93 U/L (ref 38–126)
Anion gap: 5 (ref 5–15)
BUN: 7 mg/dL (ref 6–20)
CO2: 26 mmol/L (ref 22–32)
Calcium: 8.8 mg/dL — ABNORMAL LOW (ref 8.9–10.3)
Chloride: 107 mmol/L (ref 98–111)
Creatinine: 0.64 mg/dL (ref 0.44–1.00)
GFR, Estimated: >60 mL/min (ref >60)
Glucose, Bld: 95 mg/dL (ref 70–99)
Potassium: 4.1 mmol/L (ref 3.5–5.1)
Sodium: 138 mmol/L (ref 135–145)
Total Bilirubin: 0.3 mg/dL (ref 0.3–1.2)
Total Protein: 6.7 g/dL (ref 6.5–8.1)

## 2023-04-12 LAB — CBC WITH DIFFERENTIAL (CANCER CENTER ONLY)
Abs Immature Granulocytes: 0.01 10*3/uL (ref 0.00–0.07)
Basophils Absolute: 0.1 10*3/uL (ref 0.0–0.1)
Basophils Relative: 1 %
Eosinophils Absolute: 0.1 10*3/uL (ref 0.0–0.5)
Eosinophils Relative: 3 %
HCT: 34.7 % — ABNORMAL LOW (ref 36.0–46.0)
Hemoglobin: 11.4 g/dL — ABNORMAL LOW (ref 12.0–15.0)
Immature Granulocytes: 0 %
Lymphocytes Relative: 31 %
Lymphs Abs: 1.6 10*3/uL (ref 0.7–4.0)
MCH: 30.8 pg (ref 26.0–34.0)
MCHC: 32.9 g/dL (ref 30.0–36.0)
MCV: 93.8 fL (ref 80.0–100.0)
Monocytes Absolute: 0.7 10*3/uL (ref 0.1–1.0)
Monocytes Relative: 14 %
Neutro Abs: 2.7 10*3/uL (ref 1.7–7.7)
Neutrophils Relative %: 51 %
Platelet Count: 279 10*3/uL (ref 150–400)
RBC: 3.7 MIL/uL — ABNORMAL LOW (ref 3.87–5.11)
RDW: 15.3 % (ref 11.5–15.5)
WBC Count: 5.2 10*3/uL (ref 4.0–10.5)
nRBC: 0 % (ref 0.0–0.2)

## 2023-04-12 LAB — LIPID PANEL
Cholesterol: 269 mg/dL — ABNORMAL HIGH (ref 0–200)
HDL: 66 mg/dL (ref >40)
LDL Cholesterol: 184 mg/dL — ABNORMAL HIGH (ref 0–99)
Total CHOL/HDL Ratio: 4.1 RATIO
Triglycerides: 94 mg/dL (ref <150)
VLDL: 19 mg/dL (ref 0–40)

## 2023-04-12 LAB — VITAMIN D 25 HYDROXY (VIT D DEFICIENCY, FRACTURES): Vit D, 25-Hydroxy: 26.09 ng/mL — ABNORMAL LOW (ref 30–100)

## 2023-04-12 MED ORDER — DEXTROSE 5 % IV SOLN: Freq: Once | INTRAVENOUS | Status: AC

## 2023-04-12 MED ORDER — SODIUM CHLORIDE 0.9 % IV SOLN
10.0000 mg | Freq: Once | INTRAVENOUS | Status: AC
  Administered 2023-04-12: 10 mg via INTRAVENOUS
  Filled 2023-04-12: qty 10

## 2023-04-12 MED ORDER — SODIUM CHLORIDE 0.9% FLUSH
10.0000 mL | Freq: Once | INTRAVENOUS | Status: AC
  Administered 2023-04-12: 10 mL

## 2023-04-12 MED ORDER — DIPHENHYDRAMINE HCL 25 MG PO CAPS
25.0000 mg | ORAL_CAPSULE | Freq: Once | ORAL | Status: AC
  Administered 2023-04-12: 25 mg via ORAL
  Filled 2023-04-12: qty 1

## 2023-04-12 MED ORDER — SODIUM CHLORIDE 0.9% FLUSH
10.0000 mL | INTRAVENOUS | Status: DC | PRN
  Administered 2023-04-12: 10 mL

## 2023-04-12 MED ORDER — HEPARIN SOD (PORK) LOCK FLUSH 100 UNIT/ML IV SOLN
500.0000 [IU] | Freq: Once | INTRAVENOUS | Status: AC | PRN
  Administered 2023-04-12: 500 [IU]

## 2023-04-12 MED ORDER — FAM-TRASTUZUMAB DERUXTECAN-NXKI CHEMO 100 MG IV SOLR
5.4000 mg/kg | Freq: Once | INTRAVENOUS | Status: AC
  Administered 2023-04-12: 400 mg via INTRAVENOUS
  Filled 2023-04-12: qty 20

## 2023-04-12 MED ORDER — SODIUM CHLORIDE 0.9 % IV SOLN
150.0000 mg | Freq: Once | INTRAVENOUS | Status: AC
  Administered 2023-04-12: 150 mg via INTRAVENOUS
  Filled 2023-04-12: qty 150

## 2023-04-12 MED ORDER — ACETAMINOPHEN 325 MG PO TABS
650.0000 mg | ORAL_TABLET | Freq: Once | ORAL | Status: AC
  Administered 2023-04-12: 650 mg via ORAL
  Filled 2023-04-12: qty 2

## 2023-04-12 MED ORDER — PALONOSETRON HCL INJECTION 0.25 MG/5ML
0.2500 mg | Freq: Once | INTRAVENOUS | Status: AC
  Administered 2023-04-12: 0.25 mg via INTRAVENOUS
  Filled 2023-04-12: qty 5

## 2023-04-12 NOTE — Progress Notes (Signed)
Ok to proceed without CMP today Per Dr. Pamelia Hoit due to delays with CMP analyzer being down.

## 2023-04-12 NOTE — Assessment & Plan Note (Addendum)
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  Adjuvant chemotherapy with Taxol and Herceptin followed by Herceptin maintenance completed 07/06/2022 2. breast radiation completed 01/04/2022 3.  Metastatic disease diagnosis: --------------------------------------------------------------------------------------------------------------------- CT chest on 11/01/2022: Right upper lobe nodules largest measuring 5 mm CT chest 12/29/2022: Upper and mid lung zone predominant pulmonary nodules are worrisome for metastatic disease.  Left and right hepatic lobe masses (2.5 cm and 3.9 cm) are new and are worrisome for metastatic disease. PET CT scan 01/17/2023: Hypermetabolic bilobar hepatic (2 masses measuring 2.8 cm SUV 11.1, another mass 4.1 cm SUV 10.3) and pulmonary metastases (right upper lobe lung nodule 5 mm SUV 2.6 right lower lobe 6 mm nodule SUV 1.8)   01/13/23: Liver Biopsy: Met breast cancer ER 0%, PR 0%, Ki67 50%, HER2 3+   Current treatment: Enhertu cycle 4 Chemo toxicities: Mild nausea which resolved with Compazine. Monitoring closely for toxicities.  Scans have been ordered for 04/15/2023

## 2023-04-13 ENCOUNTER — Other Ambulatory Visit: Payer: Self-pay | Admitting: Family Medicine

## 2023-04-13 LAB — TSH: TSH: 2.04 u[IU]/mL (ref 0.350–4.500)

## 2023-04-13 MED ORDER — CHOLECALCIFEROL 20 MCG (800 UNIT) PO TABS: 1.0000 | ORAL_TABLET | Freq: Every day | ORAL | 0 refills | Status: DC

## 2023-04-15 ENCOUNTER — Ambulatory Visit (HOSPITAL_COMMUNITY)
Admission: RE | Admit: 2023-04-15 | Discharge: 2023-04-15 | Disposition: A | Discharge status: Home / Self Care | Payer: 59 | Source: Ambulatory Visit | Attending: Adult Health | Admitting: Adult Health

## 2023-04-15 ENCOUNTER — Encounter (HOSPITAL_COMMUNITY): Payer: Self-pay

## 2023-04-15 DIAGNOSIS — C50111 Malignant neoplasm of central portion of right female breast: Secondary | ICD-10-CM | POA: Diagnosis not present

## 2023-04-15 DIAGNOSIS — Z171 Estrogen receptor negative status [ER-]: Secondary | ICD-10-CM | POA: Diagnosis not present

## 2023-04-15 DIAGNOSIS — C7800 Secondary malignant neoplasm of unspecified lung: Secondary | ICD-10-CM | POA: Diagnosis not present

## 2023-04-15 DIAGNOSIS — C787 Secondary malignant neoplasm of liver and intrahepatic bile duct: Secondary | ICD-10-CM | POA: Diagnosis not present

## 2023-04-15 DIAGNOSIS — N200 Calculus of kidney: Secondary | ICD-10-CM | POA: Diagnosis not present

## 2023-04-15 MED ORDER — HEPARIN SOD (PORK) LOCK FLUSH 100 UNIT/ML IV SOLN
INTRAVENOUS | Status: AC
  Filled 2023-04-15: qty 5

## 2023-04-15 MED ORDER — SODIUM CHLORIDE (PF) 0.9 % IJ SOLN
INTRAMUSCULAR | Status: AC
  Filled 2023-04-15: qty 50

## 2023-04-15 MED ORDER — IOHEXOL 300 MG/ML  SOLN
100.0000 mL | Freq: Once | INTRAMUSCULAR | Status: AC | PRN
  Administered 2023-04-15: 100 mL via INTRAVENOUS

## 2023-04-15 MED ORDER — HEPARIN SOD (PORK) LOCK FLUSH 100 UNIT/ML IV SOLN
500.0000 [IU] | Freq: Once | INTRAVENOUS | Status: AC
  Administered 2023-04-15: 500 [IU] via INTRAVENOUS

## 2023-04-16 NOTE — Progress Notes (Signed)
HEMATOLOGY-ONCOLOGY TELEPHONE VISIT PROGRESS NOTE  I connected with our patient on 04/20/23 at  9:00 AM EDT by telephone and verified that I am speaking with the correct person using two identifiers.  I discussed the limitations, risks, security and privacy concerns of performing an evaluation and management service by telephone and the availability of in person appointments.  I also discussed with the patient that there may be a patient responsible charge related to this service. The patient expressed understanding and agreed to proceed.   History of Present Illness: Lori Mcmillan is a 62 y.o with metastatic breast cancer. Currently on Enhertu. She presents to the clinic for a telephone follow-up to discuss scans.  Oncology History  Malignant neoplasm of central portion of right breast in female, estrogen receptor negative (HCC)  01/04/2012 Initial Diagnosis   Right breast cancer: Stage Ia ER/PR positive HER2 negative status postlumpectomy, radiation (low risk Oncotype) could not tolerate tamoxifen for more than 30 days.   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 No pathogenic variants identified on the [facility] Multi-Cancer Panel+RNA. VUS in POLD1 identified called c.2429C>T. The  report date is 07/01/2021.  The Multi-Cancer Panel + RNA offered by [facility] includes sequencing and/or deletion duplication testing of the following 84 genes: AIP, ALK, APC, ATM, AXIN2,BAP1,  BARD1, BLM, BMPR1A, BRCA1, BRCA2, BRIP1, CASR, CDC73, CDH1, CDK4, CDKN1B, CDKN1C, CDKN2A (p14ARF), CDKN2A (p16INK4a), CEBPA, CHEK2, CTNNA1, DICER1, DIS3L2, EGFR (c.2369C>T, p.Thr790Met variant only), EPCAM (Deletion/duplication testing only), FH, FLCN, GATA2, GPC3, GREM1 (Promoter region deletion/duplication testing only), HOXB13 (c.251G>A, p.Gly84Glu), HRAS, KIT, MAX, MEN1, MET, MITF (c.952G>A, p.Glu318Lys variant only), MLH1, MSH2, MSH3, MSH6, MUTYH, NBN, NF1, NF2, NTHL1, PALB2, PDGFRA, PHOX2B, PMS2, POLD1, POLE, POT1, PRKAR1A, PTCH1, PTEN, RAD50, RAD51C, RAD51D, RB1, RECQL4, RET, RUNX1, SDHAF2, SDHA (sequence changes only), SDHB, SDHC, SDHD, SMAD4, SMARCA4, SMARCB1, SMARCE1, STK11, SUFU, TERC, TERT, TMEM127, TP53, TSC1, TSC2, VHL, WRN and WT1.   06/18/2021 Cancer Staging   Staging form: Breast, AJCC 8th Edition - Pathologic stage from 06/18/2021: Stage IA (pT1c, pN0, cM0, G3, ER-, PR-, HER2+) - Signed by [name], NP on 10/05/2022 Stage prefix: Initial diagnosis Histologic grading system: 3 grade system   07/21/2021 - 07/06/2022 Chemotherapy   Patient is on Treatment Plan : BREAST Paclitaxel + Trastuzumab q7d / Trastuzumab q21d     11/25/2021 - 01/04/2022 Radiation Therapy   Site Technique Total Dose (Gy) Dose per Fx (Gy) Completed Fx Beam Energies  Breast, Right: Breast_R 3D 45/45 1.8 25/25 6XFFF  Breast, Right: Breast_R_Bst specialPort 5.4/5.4 1.8 3/3 12E     12/29/2022 Progression   CT chest on 11/01/2022: Right upper lobe nodules largest measuring 5 mm CT chest 12/29/2022: Upper and mid lung zone predominant pulmonary nodules are worrisome for  metastatic disease.  Left and right hepatic lobe masses (2.5 cm and 3.9 cm) are new and are worrisome for metastatic disease.   01/13/2023 Initial Biopsy    Right liver biopsy: Metastatic carcinoma with breast primary, ER-, PR-, HER2 +, Ki-67 50%.    01/13/2023 Cancer Staging   Staging form: Breast, AJCC 8th Edition - Pathologic stage from 01/13/2023: Stage IV (pM1, G3, ER-, PR-, HER2+) - Signed by [name], NP on 02/07/2023 Histologic grading system: 3 grade system   01/17/2023 PET scan   PET CT scan 01/17/2023: Hypermetabolic bilobar hepatic (2 masses measuring 2.8 cm SUV 11.1, another mass 4.1 cm SUV 10.3) and pulmonary metastases (right upper lobe lung nodule 5 mm SUV 2.6 right lower lobe 6 mm nodule SUV 1.8)    02/07/2023 -  Chemotherapy   Patient is on Treatment Plan : BREAST METASTATIC Fam-Trastuzumab Deruxtecan-nxki (Enhertu) (5.4) q21d       REVIEW OF SYSTEMS:   Constitutional: Denies fevers, chills or abnormal weight loss All other systems were reviewed with the patient and are negative. Observations/Objective:     Assessment Plan:  Malignant neoplasm of central portion of right breast in female, estrogen receptor negative (HCC) 06/18/2021:Right lumpectomy: 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  Adjuvant chemotherapy with Taxol and Herceptin followed by Herceptin maintenance completed 07/06/2022 2. breast radiation completed 01/04/2022 3.  Metastatic disease diagnosis: --------------------------------------------------------------------------------------------------------------------- CT chest on 11/01/2022: Right upper lobe nodules largest measuring 5 mm CT chest 12/29/2022: Upper and mid lung zone predominant pulmonary nodules are worrisome for metastatic disease.  Left and right hepatic lobe masses (2.5 cm and 3.9 cm) are new and are worrisome  for metastatic disease. PET CT scan 01/17/2023: Hypermetabolic bilobar hepatic (2 masses measuring 2.8 cm SUV 11.1, another mass 4.1 cm SUV 10.3) and pulmonary metastases (right upper lobe lung nodule 5 mm SUV 2.6 right lower lobe 6 mm nodule SUV 1.8)   01/13/23: Liver Biopsy: Met breast cancer ER 0%, PR 0%, Ki67 50%, HER2 3+   Current treatment: Completed 4 cycles of Enhertu Chemo toxicities: Mild nausea which resolved with Compazine. Monitoring closely for toxicities.   CT CAP 04/15/2023: Response to treatment in the lung and liver metastases.  (5 mm nodule is 3 mm 6 mm nodule is 2 to 3 mm, right liver subcapsular lesion is 1.7 cm and it used to be 3.5 cm, segment 2 liver lesion 1.3 cm it used to be 2.8 cm)  Continue with the current treatment and return to clinic every 3 weeks for Enhertu.    I discussed the assessment and treatment plan with the patient. The patient was provided an opportunity to ask questions and all were answered. The patient agreed with the plan and demonstrated an understanding of the instructions. The patient was advised to call back or seek an in-person evaluation if the symptoms worsen or if the condition fails to improve as anticipated.   I provided 12 minutes of non-face-to-face time during this encounter.  This includes time for charting and coordination of care   Tamsen Meek, MD  I Janan Ridge am acting as a scribe for Dr.Vinay Gudena  I have reviewed the above documentation for accuracy and completeness, and I  agree with the above.

## 2023-04-20 ENCOUNTER — Inpatient Hospital Stay: Payer: 59 | Attending: Hematology and Oncology | Admitting: Hematology and Oncology

## 2023-04-20 DIAGNOSIS — C7800 Secondary malignant neoplasm of unspecified lung: Secondary | ICD-10-CM | POA: Insufficient documentation

## 2023-04-20 DIAGNOSIS — Z9221 Personal history of antineoplastic chemotherapy: Secondary | ICD-10-CM | POA: Insufficient documentation

## 2023-04-20 DIAGNOSIS — Z171 Estrogen receptor negative status [ER-]: Secondary | ICD-10-CM | POA: Insufficient documentation

## 2023-04-20 DIAGNOSIS — C50111 Malignant neoplasm of central portion of right female breast: Secondary | ICD-10-CM | POA: Insufficient documentation

## 2023-04-20 DIAGNOSIS — Z79899 Other long term (current) drug therapy: Secondary | ICD-10-CM | POA: Insufficient documentation

## 2023-04-20 DIAGNOSIS — Z923 Personal history of irradiation: Secondary | ICD-10-CM | POA: Insufficient documentation

## 2023-04-20 DIAGNOSIS — R519 Headache, unspecified: Secondary | ICD-10-CM | POA: Insufficient documentation

## 2023-04-20 DIAGNOSIS — Z5112 Encounter for antineoplastic immunotherapy: Secondary | ICD-10-CM | POA: Insufficient documentation

## 2023-04-20 DIAGNOSIS — Z87891 Personal history of nicotine dependence: Secondary | ICD-10-CM | POA: Insufficient documentation

## 2023-04-20 DIAGNOSIS — C787 Secondary malignant neoplasm of liver and intrahepatic bile duct: Secondary | ICD-10-CM | POA: Insufficient documentation

## 2023-04-20 NOTE — Assessment & Plan Note (Signed)
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  Adjuvant chemotherapy with Taxol and Herceptin followed by Herceptin maintenance completed 07/06/2022 2. breast radiation completed 01/04/2022 3.  Metastatic disease diagnosis: --------------------------------------------------------------------------------------------------------------------- CT chest on 11/01/2022: Right upper lobe nodules largest measuring 5 mm CT chest 12/29/2022: Upper and mid lung zone predominant pulmonary nodules are worrisome for metastatic disease.  Left and right hepatic lobe masses (2.5 cm and 3.9 cm) are new and are worrisome for metastatic disease. PET CT scan 01/17/2023: Hypermetabolic bilobar hepatic (2 masses measuring 2.8 cm SUV 11.1, another mass 4.1 cm SUV 10.3) and pulmonary metastases (right upper lobe lung nodule 5 mm SUV 2.6 right lower lobe 6 mm nodule SUV 1.8)   01/13/23: Liver Biopsy: Met breast cancer ER 0%, PR 0%, Ki67 50%, HER2 3+   Current treatment: Enhertu cycle 5 Chemo toxicities: Mild nausea which resolved with Compazine. Monitoring closely for toxicities.   CT CAP 04/15/2023: Response to treatment in the lung and liver metastases.  (5 mm nodule is 3 mm 6 mm nodule is 2 to 3 mm, right liver subcapsular lesion is 1.7 cm and it used to be 3.5 cm, segment 2 liver lesion 1.3 cm it used to be 2.8 cm)  Continue with the current treatment and return to clinic every 3 weeks for Enhertu.

## 2023-04-22 ENCOUNTER — Ambulatory Visit (HOSPITAL_COMMUNITY)
Admission: RE | Admit: 2023-04-22 | Discharge: 2023-04-22 | Disposition: A | Discharge status: Home / Self Care | Payer: 59 | Source: Ambulatory Visit | Attending: Internal Medicine | Admitting: Internal Medicine

## 2023-04-22 DIAGNOSIS — D420 Neoplasm of uncertain behavior of cerebral meninges: Secondary | ICD-10-CM | POA: Insufficient documentation

## 2023-04-22 DIAGNOSIS — G9389 Other specified disorders of brain: Secondary | ICD-10-CM | POA: Diagnosis not present

## 2023-04-22 MED ORDER — GADOBUTROL 1 MMOL/ML IV SOLN
7.0000 mL | Freq: Once | INTRAVENOUS | Status: AC | PRN
  Administered 2023-04-22: 7 mL via INTRAVENOUS

## 2023-04-25 ENCOUNTER — Ambulatory Visit (HOSPITAL_COMMUNITY)
Admission: RE | Admit: 2023-04-25 | Discharge: 2023-04-25 | Disposition: A | Discharge status: Home / Self Care | Payer: 59 | Source: Ambulatory Visit | Attending: Adult Health | Admitting: Adult Health

## 2023-04-25 ENCOUNTER — Inpatient Hospital Stay (HOSPITAL_BASED_OUTPATIENT_CLINIC_OR_DEPARTMENT_OTHER): Payer: 59 | Admitting: Internal Medicine

## 2023-04-25 ENCOUNTER — Other Ambulatory Visit: Payer: Self-pay

## 2023-04-25 VITALS — BP 119/83 | HR 75 | Temp 97.5°F | Resp 18 | Wt 145.2 lb

## 2023-04-25 DIAGNOSIS — D420 Neoplasm of uncertain behavior of cerebral meninges: Secondary | ICD-10-CM | POA: Diagnosis not present

## 2023-04-25 DIAGNOSIS — Z171 Estrogen receptor negative status [ER-]: Secondary | ICD-10-CM | POA: Insufficient documentation

## 2023-04-25 DIAGNOSIS — R569 Unspecified convulsions: Secondary | ICD-10-CM

## 2023-04-25 DIAGNOSIS — Z79899 Other long term (current) drug therapy: Secondary | ICD-10-CM | POA: Diagnosis not present

## 2023-04-25 DIAGNOSIS — Z923 Personal history of irradiation: Secondary | ICD-10-CM | POA: Diagnosis not present

## 2023-04-25 DIAGNOSIS — C50111 Malignant neoplasm of central portion of right female breast: Secondary | ICD-10-CM | POA: Diagnosis not present

## 2023-04-25 DIAGNOSIS — Z87891 Personal history of nicotine dependence: Secondary | ICD-10-CM | POA: Diagnosis not present

## 2023-04-25 DIAGNOSIS — Z0189 Encounter for other specified special examinations: Secondary | ICD-10-CM

## 2023-04-25 DIAGNOSIS — Z79632 Long term (current) use of antitumor antibiotic: Secondary | ICD-10-CM | POA: Insufficient documentation

## 2023-04-25 DIAGNOSIS — R519 Headache, unspecified: Secondary | ICD-10-CM | POA: Diagnosis not present

## 2023-04-25 DIAGNOSIS — Z5112 Encounter for antineoplastic immunotherapy: Secondary | ICD-10-CM | POA: Diagnosis not present

## 2023-04-25 DIAGNOSIS — C7800 Secondary malignant neoplasm of unspecified lung: Secondary | ICD-10-CM | POA: Diagnosis not present

## 2023-04-25 DIAGNOSIS — C787 Secondary malignant neoplasm of liver and intrahepatic bile duct: Secondary | ICD-10-CM | POA: Diagnosis not present

## 2023-04-25 DIAGNOSIS — Z9221 Personal history of antineoplastic chemotherapy: Secondary | ICD-10-CM | POA: Diagnosis not present

## 2023-04-25 LAB — ECHOCARDIOGRAM COMPLETE
AR max vel: 1.81 cm2
AV Area VTI: 2.06 cm2
AV Area mean vel: 1.83 cm2
AV Mean grad: 4 mmHg
AV Peak grad: 5.9 mmHg
Ao pk vel: 1.21 m/s
Area-P 1/2: 3.43 cm2
S' Lateral: 3.3 cm

## 2023-04-25 NOTE — Progress Notes (Signed)
[facility] [address]  [place], [place] [address] [phone]   Interval Evaluation  Date of Service: 04/25/23 Patient Name: [name] Patient MRN: [id number] Patient DOB: 1961-05-08 Provider: Henreitta Leber, MD  Identifying Statement:  Lori Mcmillan is a 62 y.o. 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 Basilio Cairo  Interval History:  Lori Mcmillan presents today for follow up after recent MRI brain.  She continues to have near daily mild headaches.  No vertigo lately.  Has been doing well with Enhertu with Dr. Pamelia Hoit for progressive breast cancer.  Otherwise no new or progressive deficits.  No seizures with the Lamictal.  Prior (05/03/18): Completed radiation at the end of March.  She has MRI brain for review today.  She describes no new neurologic deficits.  Does have frequent moderate headaches, has been taking Tylenol daily for the past 3 months.  Continues to have double vision which limits her ability to drive, read, perform some ADLs.  She has noted history of grade I breast cancer treated with radiation several years ago.  Medications: Current Outpatient Medications on File Prior to Visit  Medication Sig Dispense Refill   acetaminophen (TYLENOL) 500 MG tablet Take 500 mg by mouth as needed.     ALPRAZolam (XANAX) 0.25 MG tablet TAKE 1 TABLET BY MOUTH TWICE A DAY AS NEEDED FOR ANXIETY 30 tablet 3   Cholecalciferol 20 MCG (800 UNIT) TABS Take 1 tablet by mouth daily. 75 tablet 0   diazepam (VALIUM) 2 MG tablet Take by mouth as needed (dizziness).     Famotidine-Ca Carb-Mag Hydrox (PEPCID COMPLETE PO) Take by mouth as needed.     lidocaine-prilocaine (EMLA) cream Apply to affected area once 30 g 3    prochlorperazine (COMPAZINE) 10 MG tablet Take 1 tablet (10 mg total) by mouth every 6 (six) hours as needed for nausea or vomiting. 30 tablet 1   ondansetron (ZOFRAN) 8 MG tablet Take 1 tablet (8 mg total) by mouth every 8 (eight) hours as needed for nausea or vomiting. Start on the third day after chemotherapy. (Patient not taking: Reported on 04/25/2023) 30 tablet 1   No current facility-administered medications on file prior to visit.    Allergies:  No Known Allergies  Past Medical History:  Past Medical History:  Diagnosis Date   Allergy    SEASONAL   Anxiety    occ lorazepam   Atypical meningioma of brain (HCC) 2018/2019   Resected, then RT Feb/Mar 2019.  No sign of residual dz at 04/2018 rad onc f/u and 10/2018 neuro-onc f/u. 03/2019 MRI brain->no resid/no recurrence.   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   Chicken pox    Depression    zoloft in past--?wt gain.   Diplopia    chronic (meningioma-related)   Eustachian tube dysfunction 09/05/2013   Family history of breast cancer    Family history of colon cancer    Family history of melanoma    Family history of ovarian cancer    Family history of pancreatic cancer    GERD (gastroesophageal reflux disease)    History of radiation therapy  02/24/12-04/11/12   right breast/ 45Gy@1 .8Gyx37fx/boost=16Gy@2  [id number].  Latest mammo and u/s 03/2013--normal.   History of radiation therapy 11/30/17- 01/11/18   Right temporal lobe treated to 55.8 Gy with 31 fx of 1.8 Gy   Hx of basal cell carcinoma    Migraine    Palpitations    has taken Metoprolol for palpitations in the past.   Seizure (HCC)    2 YEARS AGO,UPDATED 09/07/22   Taste impairment    s/p radiation therapy   Tobacco dependence 09/05/2013   Past Surgical History:  Past Surgical History:  Procedure Laterality Date   APPENDECTOMY  2008    emergency   BRAIN TUMOR EXCISION  2018   Meningioma   BREAST LUMPECTOMY Right 01/04/2012   right breast   BREAST LUMPECTOMY WITH RADIOACTIVE SEED AND SENTINEL LYMPH NODE BIOPSY Right 06/18/2021   Procedure: RIGHT BREAST LUMPECTOMY WITH RADIOACTIVE SEED AND SENTINEL LYMPH NODE BIOPSY;  Surgeon: Harriette Bouillon, MD;  Location: [facility];  Service: General;  Laterality: Right;   COLONOSCOPY     2014 ?   CRANIECTOMY  10/19/2017   at [facility] hospital   IR IMAGING GUIDED PORT INSERTION  02/02/2023   PORTACATH PLACEMENT Right 06/18/2021   Procedure: INSERTION PORT-A-CATH;  Surgeon: Harriette Bouillon, MD;  Location: [facility];  Service: General;  Laterality: Right;   PORTACATH REMOVED     OCTOBER 10,2023   WISDOM TOOTH EXTRACTION  1990   Social History:  Social History   Socioeconomic History   Marital status: Single    Spouse name: Not on file   Number of children: Not on file   Years of education: Not on file   Highest education level: Not on file  Occupational History   Not on file  Tobacco Use   Smoking status: Former    Packs/day: .5    Types: Cigarettes    Quit date: 10/17/2017    Years since quitting: 5.5    Passive exposure: Never   Smokeless tobacco: Never  Vaping Use   Vaping Use: Former  Substance and Sexual Activity   Alcohol use: Yes    Alcohol/week: 3.0 standard drinks of alcohol    Types: 3 Cans of beer per week    Comment: 3-4 beers/day   Drug use: Not Currently   Sexual activity: Not Currently    Partners: Male  Other Topics Concern   Not on file  Social History Narrative   Marital status/children/pets: Single.  No children.  Lives alone.  Has pets.Orig from [place] in [place].   Education/employment: Bachelor's degree, retired   Field seismologist:      -smoke alarm in the home:Yes     - wears seatbelt: Yes               Social Determinants of Health   Financial Resource Strain: Not on file  Food Insecurity: Not on file   Transportation Needs: Not on file  Physical Activity: Not on file  Stress: Not on file  Social Connections: Not on file  Intimate Partner Violence: Not on file   Family History:  Family History  Problem Relation Age of Onset   Anesthesia problems Mother    Breast cancer Mother 58       lumpectomy, chemo, radiation   Colon polyps Brother    Ovarian cancer Maternal Aunt 51   Cancer Maternal Aunt        unknown type   Breast cancer Maternal Aunt 38   Breast cancer  Maternal Aunt 40       bilateral   Pancreatic cancer Maternal Uncle        dx 79s   Stomach cancer Paternal Uncle    Cancer Paternal Uncle        unknown type   Lung cancer Paternal Uncle        hx smoking   Dementia Maternal Grandmother    Lung cancer Maternal Grandfather        hx smoking   Pancreatic cancer Paternal Grandmother 15   Melanoma Paternal Grandmother        dx >50, shin   Heart Problems Paternal Grandfather    Colon cancer Cousin 40       maternal first cousin   Breast cancer Cousin        dx 10s, paternal first cousin   Crohn's disease Neg Hx    Esophageal cancer Neg Hx    Rectal cancer Neg Hx    Ulcerative colitis Neg Hx     Review of Systems: Constitutional: Denies fevers, chills or abnormal weight loss Eyes: Double vision Ears, nose, mouth, throat, and face: Denies mucositis or sore throat Respiratory: Denies cough, dyspnea or wheezes Cardiovascular: Denies palpitation, chest discomfort or lower extremity swelling Gastrointestinal:  Denies nausea, constipation, diarrhea GU: Denies dysuria or incontinence Skin: Denies abnormal skin rashes Neurological: Per HPI Musculoskeletal: Denies joint pain, back or neck discomfort. No decrease in ROM Behavioral/Psych: +anxiety  Physical Exam: Vitals:   04/25/23 1030  BP: 119/83  Pulse: 75  Resp: 18  Temp: (!) 97.5 F (36.4 C)  SpO2: 100%    KPS: 90. General: Alert, cooperative, pleasant, in no acute distress Head: Craniotomy scar  noted, dry and intact. EENT: No conjunctival injection or scleral icterus. Oral mucosa moist Lungs: Resp effort normal Cardiac: Regular rate and rhythm Abdomen: Soft, non-distended abdomen Skin: R T9 dermatomal rash Extremities: No clubbing or edema  Neurologic Exam: Mental Status: Awake, alert, attentive to examiner. Oriented to self and environment. Language is fluent with intact comprehension. Age advanced pyschomotor slowing with 2/3 object recall at 5 minutes. Cranial Nerves: Visual acuity is grossly normal. Visual fields are full. Extra-ocular movements intact with horizontal diplopia on left gaze. No ptosis. Face is symmetric, tongue midline.  Some sensory loss right>left face Motor: Tone and bulk are normal. Power is full in both arms and legs. Reflexes are symmetric, no pathologic reflexes present. Intact finger to nose bilaterally Sensory: Intact to light touch and temperature Gait: Normal and tandem gait is normal.   Labs: I have reviewed the data as listed    Component Value Date/Time   NA 138 04/12/2023 0947   NA 140 11/23/2019 0000   K 4.1 04/12/2023 0947   CL 107 04/12/2023 0947   CO2 26 04/12/2023 0947   GLUCOSE 95 04/12/2023 0947   BUN 7 04/12/2023 0947   BUN 9 11/23/2019 0000   CREATININE 0.64 04/12/2023 0947   CREATININE 0.64 09/18/2013 0900   CALCIUM 8.8 (L) 04/12/2023 0947   PROT 6.7 04/12/2023 0947   ALBUMIN 3.8 04/12/2023 0947   AST 32 04/12/2023 0947   ALT 39 04/12/2023 0947   ALKPHOS 93 04/12/2023 0947   BILITOT 0.3 04/12/2023 0947   GFRNONAA >60 04/12/2023 0947   GFRAA 113 11/23/2019 0000   Lab Results  Component Value Date   WBC 5.2 04/12/2023   NEUTROABS 2.7 04/12/2023   HGB 11.4 (L) 04/12/2023   HCT 34.7 (L) 04/12/2023   MCV 93.8 04/12/2023  PLT 279 04/12/2023   Imaging:  [facility] Clinician Interpretation: I have personally reviewed the CNS images as listed.  My interpretation, in the context of the patient's clinical presentation, is  stable disease  CT CHEST ABDOMEN PELVIS W CONTRAST  Result Date: 04/15/2023 CLINICAL DATA:  Stage IV breast cancer. Evaluate treatment response. * Tracking Code: BO * EXAM: CT CHEST, ABDOMEN, AND PELVIS WITH CONTRAST TECHNIQUE: Multidetector CT imaging of the chest, abdomen and pelvis was performed following the standard protocol during bolus administration of intravenous contrast. RADIATION DOSE REDUCTION: This exam was performed according to the departmental dose-optimization program which includes automated exposure control, adjustment of the mA and/or kV according to patient size and/or use of iterative reconstruction technique. CONTRAST:  OMNIPAQUE IOHEXOL 300 MG/ML  SOLN COMPARISON:  02/09/2023 abdominopelvic CT.  PET 01/17/2023. FINDINGS: CT CHEST FINDINGS Cardiovascular: No free intraperitoneal air. Right Port-A-Cath tip mid right atrium. Aortic atherosclerosis. Normal heart size, without pericardial effusion. No central pulmonary embolism, on this non-dedicated study. Mediastinum/Nodes: No supraclavicular adenopathy. Right axillary node dissection. No mediastinal or hilar adenopathy. No axillary adenopathy. No internal mammary adenopathy. Lungs/Pleura: No pleural fluid. Minimal right upper lobe subpleural anterior radiation induced fibrosis. The 5 mm right upper lobe pulmonary nodule on the prior PET is decreased in size and has probable central cavitation today. 3 mm on 51/7. The superior segment right lower lobe pulmonary nodule is decreased in size at 2-3 mm on 77/7 today versus 6 mm on the prior PET. Musculoskeletal: Right-sided lumpectomy. No acute osseous abnormality. CT ABDOMEN PELVIS FINDINGS Hepatobiliary: Right hepatic subcapsular metastasis measures 1.7 x 1.5 cm on 64/2 versus 3.5 x 2.6 cm on 02/09/2023. The segment 2 lesion is less well-defined today at 1.3 x 1.3 cm on 66/2 versus 2.8 x 2.3 cm previously. No new or enlarging liver lesions. Normal gallbladder, without biliary ductal  dilatation. Pancreas: Normal, without mass or ductal dilatation. Spleen: Normal in size, without focal abnormality. Adrenals/Urinary Tract: Normal adrenal glands. 3 mm lower pole left renal collecting system calculus. Normal right kidney. No hydronephrosis. Normal urinary bladder. Stomach/Bowel: Normal stomach, without wall thickening. Colonic stool burden suggests constipation. Normal terminal ileum. Normal small bowel. Vascular/Lymphatic: Aortic atherosclerosis. Circumaortic left renal vein. No abdominopelvic adenopathy. Reproductive: Normal uterus and adnexa. Other: Trace cul-de-sac fluid is similar to 02/09/2023. No free intraperitoneal air. No evidence of omental or peritoneal disease. Musculoskeletal: Tiny bone island in the left femoral head. Transitional S1 vertebral body. Trace L4-5 anterolisthesis. Disc bulges at multiple lumbosacral levels. IMPRESSION: 1. Response to therapy of pulmonary and liver metastasis since comparison PET and abdominopelvic CT respectively. 2. No new or progressive disease. 3. Incidental findings, including: Aortic Atherosclerosis (ICD10-I70.0). Left nephrolithiasis. Possible constipation. Similar trace pelvic fluid, nonspecific. Electronically Signed   By: Jeronimo Greaves M.D.   On: 04/15/2023 11:26     Assessment/Plan Atypical meningioma of brain (HCC)  Seizures (HCC)  Lori Mcmillan is clinically and radiographically stable today.  MRI official read is pending.  For headaches, we discussed tools for improving sleep and sleep hygiene.  For vertigo, may dose Meclizine 25mg  q8 PRN.  Valium 5mg  may be used as adjunct if needed.  Next MRI scan can otherwise be in 1 year.   Continues on Enhertu with Dr. Pamelia Hoit.  We appreciate the opportunity to participate in the care of MANALI BODOR.   All questions were answered. The patient knows to call the clinic with any problems, questions or concerns. No barriers to learning were detected.  I have spent a total of  40 minutes of face-to-face and non-face-to-face time, excluding clinical staff time, preparing to see patient, ordering tests and/or medications, counseling the patient, and independently interpreting results and communicating results to the patient/family/caregiver    Henreitta Leber, MD Medical Director of Neuro-Oncology [facility] at [place] [facility] 04/25/23 10:42 AM

## 2023-04-26 ENCOUNTER — Other Ambulatory Visit: Payer: Self-pay

## 2023-04-28 DIAGNOSIS — H4710 Unspecified papilledema: Secondary | ICD-10-CM | POA: Diagnosis not present

## 2023-04-29 MED FILL — Dexamethasone Sodium Phosphate Inj 100 MG/10ML: INTRAMUSCULAR | Qty: 1 | Status: AC

## 2023-04-29 MED FILL — Fosaprepitant Dimeglumine For IV Infusion 150 MG (Base Eq): INTRAVENOUS | Qty: 5 | Status: AC

## 2023-05-02 ENCOUNTER — Inpatient Hospital Stay: Payer: 59 | Admitting: Adult Health

## 2023-05-02 ENCOUNTER — Encounter: Payer: Self-pay | Admitting: Adult Health

## 2023-05-02 ENCOUNTER — Inpatient Hospital Stay: Payer: 59

## 2023-05-02 ENCOUNTER — Other Ambulatory Visit: Payer: Self-pay

## 2023-05-02 DIAGNOSIS — Z5112 Encounter for antineoplastic immunotherapy: Secondary | ICD-10-CM | POA: Diagnosis not present

## 2023-05-02 DIAGNOSIS — Z171 Estrogen receptor negative status [ER-]: Secondary | ICD-10-CM

## 2023-05-02 DIAGNOSIS — C50111 Malignant neoplasm of central portion of right female breast: Secondary | ICD-10-CM

## 2023-05-02 DIAGNOSIS — Z95828 Presence of other vascular implants and grafts: Secondary | ICD-10-CM

## 2023-05-02 DIAGNOSIS — Z87891 Personal history of nicotine dependence: Secondary | ICD-10-CM | POA: Diagnosis not present

## 2023-05-02 DIAGNOSIS — Z9221 Personal history of antineoplastic chemotherapy: Secondary | ICD-10-CM | POA: Diagnosis not present

## 2023-05-02 DIAGNOSIS — C787 Secondary malignant neoplasm of liver and intrahepatic bile duct: Secondary | ICD-10-CM | POA: Diagnosis not present

## 2023-05-02 DIAGNOSIS — Z923 Personal history of irradiation: Secondary | ICD-10-CM | POA: Diagnosis not present

## 2023-05-02 DIAGNOSIS — R519 Headache, unspecified: Secondary | ICD-10-CM | POA: Diagnosis not present

## 2023-05-02 DIAGNOSIS — C7800 Secondary malignant neoplasm of unspecified lung: Secondary | ICD-10-CM | POA: Diagnosis not present

## 2023-05-02 DIAGNOSIS — Z79899 Other long term (current) drug therapy: Secondary | ICD-10-CM | POA: Diagnosis not present

## 2023-05-02 LAB — CMP (CANCER CENTER ONLY)
ALT: 43 U/L (ref 0–44)
AST: 39 U/L (ref 15–41)
Albumin: 3.9 g/dL (ref 3.5–5.0)
Alkaline Phosphatase: 90 U/L (ref 38–126)
Anion gap: 5 (ref 5–15)
BUN: 7 mg/dL (ref 6–20)
CO2: 25 mmol/L (ref 22–32)
Calcium: 9 mg/dL (ref 8.9–10.3)
Chloride: 108 mmol/L (ref 98–111)
Creatinine: 0.61 mg/dL (ref 0.44–1.00)
GFR, Estimated: >60 mL/min (ref >60)
Glucose, Bld: 78 mg/dL (ref 70–99)
Potassium: 4.2 mmol/L (ref 3.5–5.1)
Sodium: 138 mmol/L (ref 135–145)
Total Bilirubin: 0.3 mg/dL (ref 0.3–1.2)
Total Protein: 6.6 g/dL (ref 6.5–8.1)

## 2023-05-02 LAB — CBC WITH DIFFERENTIAL (CANCER CENTER ONLY)
Abs Immature Granulocytes: 0.01 10*3/uL (ref 0.00–0.07)
Basophils Absolute: 0 10*3/uL (ref 0.0–0.1)
Basophils Relative: 1 %
Eosinophils Absolute: 0.2 10*3/uL (ref 0.0–0.5)
Eosinophils Relative: 4 %
HCT: 34.9 % — ABNORMAL LOW (ref 36.0–46.0)
Hemoglobin: 11.3 g/dL — ABNORMAL LOW (ref 12.0–15.0)
Immature Granulocytes: 0 %
Lymphocytes Relative: 33 %
Lymphs Abs: 1.4 10*3/uL (ref 0.7–4.0)
MCH: 30.4 pg (ref 26.0–34.0)
MCHC: 32.4 g/dL (ref 30.0–36.0)
MCV: 93.8 fL (ref 80.0–100.0)
Monocytes Absolute: 0.5 10*3/uL (ref 0.1–1.0)
Monocytes Relative: 12 %
Neutro Abs: 2.1 10*3/uL (ref 1.7–7.7)
Neutrophils Relative %: 50 %
Platelet Count: 258 10*3/uL (ref 150–400)
RBC: 3.72 MIL/uL — ABNORMAL LOW (ref 3.87–5.11)
RDW: 15.4 % (ref 11.5–15.5)
WBC Count: 4.3 10*3/uL (ref 4.0–10.5)
nRBC: 0 % (ref 0.0–0.2)

## 2023-05-02 MED ORDER — SODIUM CHLORIDE 0.9 % IV SOLN
10.0000 mg | Freq: Once | INTRAVENOUS | Status: AC
  Administered 2023-05-02: 10 mg via INTRAVENOUS
  Filled 2023-05-02: qty 10

## 2023-05-02 MED ORDER — SODIUM CHLORIDE 0.9 % IV SOLN
150.0000 mg | Freq: Once | INTRAVENOUS | Status: AC
  Administered 2023-05-02: 150 mg via INTRAVENOUS
  Filled 2023-05-02: qty 150

## 2023-05-02 MED ORDER — SODIUM CHLORIDE 0.9% FLUSH
10.0000 mL | INTRAVENOUS | Status: DC | PRN
  Administered 2023-05-02: 10 mL

## 2023-05-02 MED ORDER — ACETAMINOPHEN 325 MG PO TABS
650.0000 mg | ORAL_TABLET | Freq: Once | ORAL | Status: AC
  Administered 2023-05-02: 650 mg via ORAL
  Filled 2023-05-02: qty 2

## 2023-05-02 MED ORDER — DEXTROSE 5 % IV SOLN: Freq: Once | INTRAVENOUS | Status: AC

## 2023-05-02 MED ORDER — DIPHENHYDRAMINE HCL 25 MG PO CAPS
25.0000 mg | ORAL_CAPSULE | Freq: Once | ORAL | Status: AC
  Administered 2023-05-02: 25 mg via ORAL
  Filled 2023-05-02: qty 1

## 2023-05-02 MED ORDER — PALONOSETRON HCL INJECTION 0.25 MG/5ML
0.2500 mg | Freq: Once | INTRAVENOUS | Status: AC
  Administered 2023-05-02: 0.25 mg via INTRAVENOUS
  Filled 2023-05-02: qty 5

## 2023-05-02 MED ORDER — HEPARIN SOD (PORK) LOCK FLUSH 100 UNIT/ML IV SOLN
500.0000 [IU] | Freq: Once | INTRAVENOUS | Status: AC | PRN
  Administered 2023-05-02: 500 [IU]

## 2023-05-02 MED ORDER — FAM-TRASTUZUMAB DERUXTECAN-NXKI CHEMO 100 MG IV SOLR
5.4000 mg/kg | Freq: Once | INTRAVENOUS | Status: AC
  Administered 2023-05-02: 400 mg via INTRAVENOUS
  Filled 2023-05-02: qty 20

## 2023-05-02 MED ORDER — SODIUM CHLORIDE 0.9% FLUSH
10.0000 mL | Freq: Once | INTRAVENOUS | Status: AC
  Administered 2023-05-02: 10 mL

## 2023-05-02 NOTE — Assessment & Plan Note (Signed)
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  Adjuvant chemotherapy with Taxol and Herceptin followed by Herceptin maintenance completed 07/06/2022 2. breast radiation completed 01/04/2022 3.  Metastatic disease diagnosis: --------------------------------------------------------------------------------------------------------------------- CT chest on 11/01/2022: Right upper lobe nodules largest measuring 5 mm CT chest 12/29/2022: Upper and mid lung zone predominant pulmonary nodules are worrisome for metastatic disease.  Left and right hepatic lobe masses (2.5 cm and 3.9 cm) are new and are worrisome for metastatic disease. PET CT scan 01/17/2023: Hypermetabolic bilobar hepatic (2 masses measuring 2.8 cm SUV 11.1, another mass 4.1 cm SUV 10.3) and pulmonary metastases (right upper lobe lung nodule 5 mm SUV 2.6 right lower lobe 6 mm nodule SUV 1.8) 01/13/23: Liver Biopsy: Met breast cancer ER 0%, PR 0%, Ki67 50%, HER2 3+   CT CAP 04/15/2023: Response to treatment in the lung and liver metastases.  (5 mm nodule is 3 mm 6 mm nodule is 2 to 3 mm, right liver subcapsular lesion is 1.7 cm and it used to be 3.5 cm, segment 2 liver lesion 1.3 cm it used to be 2.8 cm)  Current treatment: Enhertu Chemo toxicities: Mild nausea she manages with Compazine Headaches: likely secondary to sleep and decreased appetite.  Encouraged she eat small frequent meals, offered nutrition referral, declined by patient. Monitoring closely for toxicities.  RTC in 3 weeks for labs, f/u with Dr. Pamelia Hoit, and her next Enhertu.

## 2023-05-02 NOTE — Progress Notes (Signed)
[facility] Cancer Follow up:    Lori Coombe, DO [address]  [address] [place] [address]   DIAGNOSIS:  Cancer Staging  Malignant neoplasm of central portion of right breast in female, estrogen receptor negative (HCC) Staging form: Breast, AJCC 6th Edition - Pathologic stage from 01/04/2012: Stage I (T1c, N0, M0) - Signed by [name], NP on 10/05/2022 Histologic grade (G): G1 Staging form: Breast, AJCC 8th Edition - Pathologic stage from 06/18/2021: Stage IA (pT1c, pN0, cM0, G3, ER-, PR-, HER2+) - Signed by [name], NP on 10/05/2022 Stage prefix: Initial diagnosis Histologic grading system: 3 grade system - Pathologic stage from 01/13/2023: Stage IV (pM1, G3, ER-, PR-, HER2+) - Signed by [name], NP on 02/07/2023 Histologic grading system: 3 grade system   SUMMARY OF ONCOLOGIC HISTORY: Oncology History  Malignant neoplasm of central portion of right breast in female, estrogen receptor negative (HCC)  01/04/2012 Initial Diagnosis   Right breast cancer: Stage Ia ER/PR positive HER2 negative status postlumpectomy, radiation (low risk Oncotype) could not tolerate tamoxifen for more than 30 days.   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  Biopsy revealed grade 3 IDC ER/PR negative HER2 positive with a Ki-67 of 30%   06/18/2021 Surgery   Right lumpectomy: Grade 3 IDC 1.5 cm, high-grade DCIS, margins negative, 0/2 lymph nodes negative, ER 0%, PR 0%, HER2 3+ positive, Ki-67 30%    Genetic  Testing   Negative genetic testing. No pathogenic variants identified on the [facility] Multi-Cancer Panel+RNA. VUS in POLD1 identified called c.2429C>T. The report date is 07/01/2021.  The Multi-Cancer Panel + RNA offered by [facility] includes sequencing and/or deletion duplication testing of the following 84 genes: AIP, ALK, APC, ATM, AXIN2,BAP1,  BARD1, BLM, BMPR1A, BRCA1, BRCA2, BRIP1, CASR, CDC73, CDH1, CDK4, CDKN1B, CDKN1C, CDKN2A (p14ARF), CDKN2A (p16INK4a), CEBPA, CHEK2, CTNNA1, DICER1, DIS3L2, EGFR (c.2369C>T, p.Thr790Met variant only), EPCAM (Deletion/duplication testing only), FH, FLCN, GATA2, GPC3, GREM1 (Promoter region deletion/duplication testing only), HOXB13 (c.251G>A, p.Gly84Glu), HRAS, KIT, MAX, MEN1, MET, MITF (c.952G>A, p.Glu318Lys variant only), MLH1, MSH2, MSH3, MSH6, MUTYH, NBN, NF1, NF2, NTHL1, PALB2, PDGFRA, PHOX2B, PMS2, POLD1, POLE, POT1, PRKAR1A, PTCH1, PTEN, RAD50, RAD51C, RAD51D, RB1, RECQL4, RET, RUNX1, SDHAF2, SDHA (sequence changes only), SDHB, SDHC, SDHD, SMAD4, SMARCA4, SMARCB1, SMARCE1, STK11, SUFU, TERC, TERT, TMEM127, TP53, TSC1, TSC2, VHL, WRN and WT1.   06/18/2021 Cancer Staging   Staging form: Breast, AJCC 8th Edition - Pathologic stage from 06/18/2021: Stage IA (pT1c, pN0, cM0, G3, ER-, PR-, HER2+) - Signed by [name], NP on 10/05/2022 Stage prefix: Initial diagnosis Histologic grading system: 3 grade system   07/21/2021 - 07/06/2022 Chemotherapy   Patient is on Treatment Plan : BREAST Paclitaxel + Trastuzumab q7d / Trastuzumab q21d     11/25/2021 - 01/04/2022 Radiation Therapy   Site Technique Total Dose (Gy) Dose per Fx (Gy) Completed Fx Beam Energies  Breast, Right: Breast_R 3D 45/45 1.8 25/25 6XFFF  Breast, Right: Breast_R_Bst specialPort 5.4/5.4 1.8 3/3 12E     12/29/2022 Progression  CT chest on 11/01/2022: Right upper lobe nodules largest measuring 5 mm CT chest 12/29/2022: Upper and mid lung zone predominant pulmonary nodules are worrisome for  metastatic disease.  Left and right hepatic lobe masses (2.5 cm and 3.9 cm) are new and are worrisome for metastatic disease.   01/13/2023 Initial Biopsy   Right liver biopsy: Metastatic carcinoma with breast primary, ER-, PR-, HER2 +, Ki-67 50%.    01/13/2023 Cancer Staging   Staging form: Breast, AJCC 8th Edition - Pathologic stage from 01/13/2023: Stage IV (pM1, G3, ER-, PR-, HER2+) - Signed by [name], NP on 02/07/2023 Histologic grading system: 3 grade system   01/17/2023 PET scan   PET CT scan 01/17/2023: Hypermetabolic bilobar hepatic (2 masses measuring 2.8 cm SUV 11.1, another mass 4.1 cm SUV 10.3) and pulmonary metastases (right upper lobe lung nodule 5 mm SUV 2.6 right lower lobe 6 mm nodule SUV 1.8)    02/07/2023 -  Chemotherapy   Patient is on Treatment Plan : BREAST METASTATIC Fam-Trastuzumab Deruxtecan-nxki (Enhertu) (5.4) q21d       CURRENT THERAPY: Enhertu  INTERVAL HISTORY: Lori Mcmillan 62 y.o. female returns for f/u while receiving Enhertu every three weeks.  She underwent CT chest abdomen pelvis on April 15, 2023 that demonstrated response to therapy of pulmonary and liver metastasis and no new or progressive findings.  She also underwent MRI of the brain on April 30, 2023 that showed similar postop findings without evidence of residual or recurrent tumor and no acute abnormality was noted.  She notes she is dropping things more frequently.  She is down about 5 pounds since her last visit.  Her f/u with Dr. Barbaraann Mcmillan in neuro-oncology occurred on 04/25/2023 and he attributed her HAs to sleep.  Lori Mcmillan believes her decreased appetite is also partly to blame. She is drinking premeir protein shakes.  She says that nothing looks appealing to her.  Her most recent LVEF was 55-60% on 04/25/2023.   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)  02/07/2023   Cancer with pulmonary metastases (HCC) 02/07/2023   Cellulitis of finger of right hand 05/12/2022   Eustachian tube dysfunction, right 08/09/2021   Port-A-Cath in place 07/21/2021   Genetic testing 07/02/2021   Hx of basal cell carcinoma 05/26/2021   Family history of breast cancer 05/26/2021   Family history of ovarian cancer 05/26/2021   Family history of pancreatic cancer 05/26/2021   Family history of colon cancer 05/26/2021   Family history of melanoma 05/26/2021   Breast lump in upper inner quadrant 04/30/2021   Post herpetic neuralgia 01/29/2021   Seizures (HCC) 01/01/2021   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  No sign of residual dz at 04/2018 rad onc f/u and 10/2018 neuro-onc f/u. 03/2019 MRI brain->no resid/no recurrence.   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  Chicken pox    Depression    zoloft in past--?wt gain.   Diplopia    chronic (meningioma-related)   Eustachian tube dysfunction 09/05/2013   Family history of breast cancer    Family history of colon cancer    Family history of melanoma    Family history of ovarian cancer    Family history of pancreatic cancer    GERD (gastroesophageal reflux disease)    History of radiation therapy 02/24/12-04/11/12   right breast/ 45Gy@1 .8Gyx53fx/boost=16Gy@2  Gya53fx.  Latest mammo and u/s 03/2013--normal.   History of radiation therapy 11/30/17- 01/11/18   Right temporal lobe treated to 55.8 Gy with 31 fx of 1.8 Gy   Hx of basal cell carcinoma     Migraine    Palpitations    has taken Metoprolol for palpitations in the past.   Seizure (HCC)    2 YEARS AGO,UPDATED 09/07/22   Taste impairment    s/p radiation therapy   Tobacco dependence 09/05/2013    SURGICAL HISTORY: Past Surgical History:  Procedure Laterality Date   APPENDECTOMY  2008   emergency   BRAIN TUMOR EXCISION  2018   Meningioma   BREAST LUMPECTOMY Right 01/04/2012   right breast   BREAST LUMPECTOMY WITH RADIOACTIVE SEED AND SENTINEL LYMPH NODE BIOPSY Right 06/18/2021   Procedure: RIGHT BREAST LUMPECTOMY WITH RADIOACTIVE SEED AND SENTINEL LYMPH NODE BIOPSY;  Surgeon: Harriette Bouillon, MD;  Location: [facility];  Service: General;  Laterality: Right;   COLONOSCOPY     2014 ?   CRANIECTOMY  10/19/2017   at [facility]   IR IMAGING GUIDED PORT INSERTION  02/02/2023   PORTACATH PLACEMENT Right 06/18/2021   Procedure: INSERTION PORT-A-CATH;  Surgeon: Harriette Bouillon, MD;  Location: [facility];  Service: General;  Laterality: Right;   PORTACATH REMOVED     OCTOBER 10,2023   WISDOM TOOTH EXTRACTION  1990    SOCIAL HISTORY: Social History   Socioeconomic History   Marital status: Single    Spouse name: Not on file   Number of children: Not on file   Years of education: Not on file   Highest education level: Not on file  Occupational History   Not on file  Tobacco Use   Smoking status: Former    Current packs/day: 0.00    Types: Cigarettes    Quit date: 10/17/2017    Years since quitting: 5.5    Passive exposure: Never   Smokeless tobacco: Never  Vaping Use   Vaping status: Former  Substance and Sexual Activity   Alcohol use: Yes    Alcohol/week: 3.0 standard drinks of alcohol    Types: 3 Cans of beer per week    Comment: 3-4 beers/day   Drug use: Not Currently   Sexual activity: Not Currently    Partners: Male  Other Topics Concern   Not on file  Social History Narrative   Marital status/children/pets: Single.  No  children.  Lives alone.  Has pets.Orig from [place] in [place].   Education/employment: Bachelor's degree, retired   Field seismologist:      -smoke alarm in the home:Yes     - wears seatbelt: Yes               Social Determinants of Health   Financial Resource Strain: Not on file  Food Insecurity: Not on file  Transportation Needs: Not on file  Physical Activity: Not on file  Stress: Not on file  Social Connections: Unknown (02/28/2022)  [facility]   Social Network    Social Network: Not on file  Intimate Partner Violence: Unknown (01/20/2022)   Received from [facility]   HITS    Physically Hurt: Not on file    Insult or Talk Down To: Not on file    Threaten Physical Harm: Not on file    Scream or Curse: Not on file    FAMILY HISTORY: Family History  Problem Relation Age of Onset   Anesthesia problems Mother    Breast cancer Mother 41       lumpectomy, chemo, radiation   Colon polyps Brother    Ovarian cancer Maternal Aunt 28   Cancer Maternal Aunt        unknown type   Breast cancer Maternal Aunt 80   Breast cancer Maternal Aunt 40       bilateral   Pancreatic cancer Maternal Uncle        dx 23s   Stomach cancer Paternal Uncle    Cancer Paternal Uncle        unknown type   Lung cancer Paternal Uncle        hx smoking   Dementia Maternal Grandmother    Lung cancer Maternal Grandfather        hx smoking   Pancreatic cancer Paternal Grandmother 31   Melanoma Paternal Grandmother        dx >50, shin   Heart Problems Paternal Grandfather    Colon cancer Cousin 50       maternal first cousin   Breast cancer Cousin        dx 50s, paternal first cousin   Crohn's disease Neg Hx    Esophageal cancer Neg Hx    Rectal cancer Neg Hx    Ulcerative colitis Neg Hx     Review of Systems  Constitutional:  Negative for appetite change, chills, fatigue, fever and unexpected weight change.  HENT:   Negative for hearing loss, lump/mass and trouble swallowing.    Eyes:  Negative for eye problems and icterus.  Respiratory:  Negative for chest tightness, cough and shortness of breath.   Cardiovascular:  Negative for chest pain, leg swelling and palpitations.  Gastrointestinal:  Negative for abdominal distention, abdominal pain, constipation, diarrhea, nausea and vomiting.  Endocrine: Negative for hot flashes.  Genitourinary:  Negative for difficulty urinating.   Musculoskeletal:  Negative for arthralgias.  Skin:  Negative for itching and rash.  Neurological:  Negative for dizziness, extremity weakness, headaches and numbness.  Hematological:  Negative for adenopathy. Does not bruise/bleed easily.  Psychiatric/Behavioral:  Negative for depression. The patient is not nervous/anxious.       PHYSICAL EXAMINATION   Onc Performance Status - 05/02/23 1120       ECOG Perf Status   ECOG Perf Status Restricted in physically strenuous activity but ambulatory and able to carry out work of a light or sedentary nature, e.g., light house work, office work      KPS SCALE   KPS % SCORE Able to carry on normal activity, minor s/s of disease             Vitals:   05/02/23 1120  BP: 109/88  Pulse: 85  Temp: 98.5 F (36.9 C)  SpO2: 98%    Physical Exam Constitutional:      General: She is not in acute distress.    Appearance: Normal appearance. She is not toxic-appearing.  HENT:     Head: Normocephalic and atraumatic.  Mouth/Throat:     Mouth: Mucous membranes are moist.     Pharynx: Oropharynx is clear. No oropharyngeal exudate or posterior oropharyngeal erythema.  Eyes:     General: No scleral icterus. Cardiovascular:     Rate and Rhythm: Normal rate and regular rhythm.     Pulses: Normal pulses.     Heart sounds: Normal heart sounds.  Pulmonary:     Effort: Pulmonary effort is normal.     Breath sounds: Normal breath sounds.  Abdominal:     General: Abdomen is flat. Bowel sounds are normal. There is no distension.     Palpations:  Abdomen is soft.     Tenderness: There is no abdominal tenderness.  Musculoskeletal:        General: No swelling.     Cervical back: Neck supple.  Lymphadenopathy:     Cervical: No cervical adenopathy.  Skin:    General: Skin is warm and dry.     Findings: No rash.  Neurological:     General: No focal deficit present.     Mental Status: She is alert.  Psychiatric:        Mood and Affect: Mood normal.        Behavior: Behavior normal.     LABORATORY DATA:  CBC    Component Value Date/Time   WBC 4.3 05/02/2023 1024   WBC 7.0 01/13/2023 1200   RBC 3.72 (L) 05/02/2023 1024   HGB 11.3 (L) 05/02/2023 1024   HGB 11.8 12/08/2011 0819   HCT 34.9 (L) 05/02/2023 1024   HCT 34.9 12/08/2011 0819   PLT 258 05/02/2023 1024   PLT 314 12/08/2011 0819   MCV 93.8 05/02/2023 1024   MCV 88.3 12/08/2011 0819   MCH 30.4 05/02/2023 1024   MCHC 32.4 05/02/2023 1024   RDW 15.4 05/02/2023 1024   RDW 14.0 12/08/2011 0819   LYMPHSABS 1.4 05/02/2023 1024   LYMPHSABS 2.2 12/08/2011 0819   MONOABS 0.5 05/02/2023 1024   MONOABS 0.8 12/08/2011 0819   EOSABS 0.2 05/02/2023 1024   EOSABS 0.2 12/08/2011 0819   BASOSABS 0.0 05/02/2023 1024   BASOSABS 0.1 12/08/2011 0819    CMP     Component Value Date/Time   NA 138 05/02/2023 1024   NA 140 11/23/2019 0000   K 4.2 05/02/2023 1024   CL 108 05/02/2023 1024   CO2 25 05/02/2023 1024   GLUCOSE 78 05/02/2023 1024   BUN 7 05/02/2023 1024   BUN 9 11/23/2019 0000   CREATININE 0.61 05/02/2023 1024   CREATININE 0.64 09/18/2013 0900   CALCIUM 9.0 05/02/2023 1024   PROT 6.6 05/02/2023 1024   ALBUMIN 3.9 05/02/2023 1024   AST 39 05/02/2023 1024   ALT 43 05/02/2023 1024   ALKPHOS 90 05/02/2023 1024   BILITOT 0.3 05/02/2023 1024   GFRNONAA >60 05/02/2023 1024   GFRAA 113 11/23/2019 0000       ASSESSMENT and THERAPY PLAN:   Malignant neoplasm of central portion of right breast in female, estrogen receptor negative (HCC) 06/18/2021:Right  lumpectomy: 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  Adjuvant chemotherapy with Taxol and Herceptin followed by Herceptin maintenance completed 07/06/2022 2. breast radiation completed 01/04/2022 3.  Metastatic disease diagnosis: ---------------------------------------------------------------------------------------------------------------------  CT chest on 11/01/2022: Right upper lobe nodules largest measuring 5 mm CT chest 12/29/2022: Upper and mid lung zone predominant pulmonary nodules are worrisome for metastatic disease.  Left and right hepatic lobe masses (2.5 cm and 3.9 cm) are new and are worrisome for metastatic disease. PET CT scan 01/17/2023: Hypermetabolic bilobar hepatic (2 masses measuring 2.8 cm SUV 11.1, another mass 4.1 cm SUV 10.3) and pulmonary metastases (right upper lobe lung nodule 5 mm SUV 2.6 right lower lobe 6 mm nodule SUV 1.8) 01/13/23: Liver Biopsy: Met breast cancer ER 0%, PR 0%, Ki67 50%, HER2 3+   CT CAP 04/15/2023: Response to treatment in the lung and liver metastases.  (5 mm nodule is 3 mm 6 mm nodule is 2 to 3 mm, right liver subcapsular lesion is 1.7 cm and it used to be 3.5 cm, segment 2 liver lesion 1.3 cm it used to be 2.8 cm)  Current treatment: Enhertu Chemo toxicities: Mild nausea she manages with Compazine Headaches: likely secondary to sleep and decreased appetite.  Encouraged she eat small frequent meals, offered nutrition referral, declined by patient. Monitoring closely for toxicities.  RTC in 3 weeks for labs, f/u with Dr. Pamelia Hoit, and her next Enhertu.    All questions were answered. The patient knows to call the clinic with any problems, questions or concerns. We can certainly  see the patient much sooner if necessary.  Total encounter time:30 minutes*in face-to-face visit time, chart review, lab review, care coordination, order entry, and documentation of the encounter time.  Lillard Anes, NP 05/02/23 11:44 AM Medical Oncology and Hematology [facility] [address] [place], [place] [address] Tel. [phone]    Fax. [phone]  *Total Encounter Time as defined by the Centers for Medicare and Medicaid Services includes, in addition to the face-to-face time of a patient visit (documented in the note above) non-face-to-face time: obtaining and reviewing outside history, ordering and reviewing medications, tests or procedures, care coordination (communications with other health care professionals or caregivers) and documentation in the medical record.

## 2023-05-16 DIAGNOSIS — Z803 Family history of malignant neoplasm of breast: Secondary | ICD-10-CM | POA: Diagnosis not present

## 2023-05-16 DIAGNOSIS — Z853 Personal history of malignant neoplasm of breast: Secondary | ICD-10-CM | POA: Diagnosis not present

## 2023-05-19 ENCOUNTER — Encounter: Payer: Self-pay | Admitting: Hematology and Oncology

## 2023-05-22 ENCOUNTER — Ambulatory Visit
Admission: EM | Admit: 2023-05-22 | Discharge: 2023-05-22 | Disposition: A | Discharge status: Home / Self Care | Payer: 59 | Attending: Family Medicine | Admitting: Family Medicine

## 2023-05-22 ENCOUNTER — Encounter: Payer: Self-pay | Admitting: Hematology and Oncology

## 2023-05-22 ENCOUNTER — Encounter: Payer: Self-pay | Admitting: Emergency Medicine

## 2023-05-22 DIAGNOSIS — J029 Acute pharyngitis, unspecified: Secondary | ICD-10-CM

## 2023-05-22 LAB — POCT RAPID STREP A (OFFICE): Rapid Strep A Screen: NEGATIVE

## 2023-05-22 MED ORDER — AMOXICILLIN 875 MG PO TABS: 875.0000 mg | ORAL_TABLET | Freq: Two times a day (BID) | ORAL | 0 refills | Status: DC

## 2023-05-22 NOTE — ED Triage Notes (Signed)
Pt states on Thursday she started having cold symptoms that have resolved but she is still having sore throat and sinus pain. States her throat feels dry and swollen lymph nodes. She is taking tylenol cold and sinus.

## 2023-05-22 NOTE — Discharge Instructions (Signed)
Drink lots of water Take the amoxicillin 2 x a day until sore throat clears Use chloraseptic or salt water gargles for pain

## 2023-05-22 NOTE — ED Provider Notes (Signed)
[facility]    CSN: [id number] Arrival date & time: 05/22/23  0819      History   Chief Complaint Chief Complaint  Patient presents with   Sore Throat    HPI Lori Mcmillan is a 62 y.o. female.   Patient started with upper respiratory infection symptoms on Thursday.  She took over-the-counter Tylenol cold medicine.  She states she feels better except for a painful sore throat.  The sore throat feels like it is getting worse instead of better.  She no longer has any runny nose or sinus congestion.  No fever or chills.  No nausea or vomiting. Patient has metastatic breast cancer.  She is undergoing chemotherapy.  She is scheduled for her next chemotherapy on Tuesday of next week.  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  No sign of residual dz at 04/2018 rad onc f/u and 10/2018 neuro-onc f/u. 03/2019 MRI brain->no resid/no recurrence.   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   Chicken pox    Depression    zoloft in past--?wt gain.   Diplopia    chronic (meningioma-related)   Eustachian tube dysfunction 09/05/2013   Family history of breast cancer    Family history of colon cancer    Family history of melanoma    Family history of ovarian cancer    Family history of pancreatic cancer    GERD (gastroesophageal reflux disease)    History of radiation therapy 02/24/12-04/11/12   right breast/ 45Gy@1 .8Gyx24fx/boost=16Gy@2  [id number].  Latest mammo and u/s 03/2013--normal.   History of radiation therapy 11/30/17- 01/11/18   Right temporal lobe treated to 55.8 Gy with 31 fx of 1.8 Gy   Hx of basal cell carcinoma     Migraine    Palpitations    has taken Metoprolol for palpitations in the past.   Seizure (HCC)    2 YEARS AGO,UPDATED 09/07/22   Taste impairment    s/p radiation therapy   Tobacco dependence 09/05/2013    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) 02/07/2023   Cancer with pulmonary metastases (HCC) 02/07/2023   Cellulitis of finger of right hand 05/12/2022   Eustachian tube dysfunction, right 08/09/2021   Port-A-Cath in place 07/21/2021   Genetic testing 07/02/2021   Hx of basal cell carcinoma 05/26/2021   Family history of breast cancer 05/26/2021   Family history of ovarian cancer 05/26/2021   Family history of pancreatic cancer 05/26/2021   Family history of colon cancer 05/26/2021   Family history of melanoma 05/26/2021   Breast lump in upper inner quadrant 04/30/2021   Post herpetic neuralgia 01/29/2021   Seizures (HCC) 01/01/2021   Atypical meningioma of brain (HCC) 11/16/2017   Malignant neoplasm of central portion of right breast in female, estrogen receptor negative (HCC) 12/02/2011    Past Surgical History:  Procedure Laterality Date   APPENDECTOMY  2008   emergency   BRAIN TUMOR  EXCISION  2018   Meningioma   BREAST LUMPECTOMY Right 01/04/2012   right breast   BREAST LUMPECTOMY WITH RADIOACTIVE SEED AND SENTINEL LYMPH NODE BIOPSY Right 06/18/2021   Procedure: RIGHT BREAST LUMPECTOMY WITH RADIOACTIVE SEED AND SENTINEL LYMPH NODE BIOPSY;  Surgeon: Harriette Bouillon, MD;  Location: [facility];  Service: General;  Laterality: Right;   COLONOSCOPY     2014 ?   CRANIECTOMY  10/19/2017   at [facility]   IR IMAGING GUIDED PORT INSERTION  02/02/2023   PORTACATH PLACEMENT Right 06/18/2021   Procedure: INSERTION PORT-A-CATH;  Surgeon: Harriette Bouillon, MD;  Location: [facility];  Service: General;  Laterality: Right;    PORTACATH REMOVED     OCTOBER 10,2023   WISDOM TOOTH EXTRACTION  1990    OB History     Gravida  0   Para  0   Term  0   Preterm  0   AB  0   Living  0      SAB  0   IAB  0   Ectopic  0   Multiple  0   Live Births  0            Home Medications    Prior to Admission medications   Medication Sig Start Date End Date Taking? Authorizing Provider  amoxicillin (AMOXIL) 875 MG tablet Take 1 tablet (875 mg total) by mouth 2 (two) times daily. 05/22/23  Yes Eustace Moore, MD  acetaminophen (TYLENOL) 500 MG tablet Take 500 mg by mouth as needed.    [provider]  ALPRAZolam (XANAX) 0.25 MG tablet TAKE 1 TABLET BY MOUTH TWICE A DAY AS NEEDED FOR ANXIETY 03/15/23   Serena Croissant, MD  diazepam (VALIUM) 2 MG tablet Take by mouth as needed (dizziness). 09/04/22   [provider]  Famotidine-Ca Carb-Mag Hydrox (PEPCID COMPLETE PO) Take by mouth as needed.    [provider]  lidocaine-prilocaine (EMLA) cream Apply to affected area once 01/24/23   Serena Croissant, MD  ondansetron (ZOFRAN) 8 MG tablet Take 1 tablet (8 mg total) by mouth every 8 (eight) hours as needed for nausea or vomiting. Start on the third day after chemotherapy. Patient not taking: Reported on 04/25/2023 01/24/23   Serena Croissant, MD  prochlorperazine (COMPAZINE) 10 MG tablet Take 1 tablet (10 mg total) by mouth every 6 (six) hours as needed for nausea or vomiting. 01/24/23   Serena Croissant, MD    Family History Family History  Problem Relation Age of Onset   Anesthesia problems Mother    Breast cancer Mother 41       lumpectomy, chemo, radiation   Colon polyps Brother    Ovarian cancer Maternal Aunt 27   Cancer Maternal Aunt        unknown type   Breast cancer Maternal Aunt 80   Breast cancer Maternal Aunt 40       bilateral   Pancreatic cancer Maternal Uncle        dx 56s   Stomach cancer Paternal Uncle    Cancer Paternal Uncle        unknown type   Lung cancer  Paternal Uncle        hx smoking   Dementia Maternal Grandmother    Lung cancer Maternal Grandfather        hx smoking   Pancreatic cancer Paternal Grandmother 52   Melanoma Paternal Grandmother        dx >50,  shin   Heart Problems Paternal Grandfather    Colon cancer Cousin 52       maternal first cousin   Breast cancer Cousin        dx 2s, paternal first cousin   Crohn's disease Neg Hx    Esophageal cancer Neg Hx    Rectal cancer Neg Hx    Ulcerative colitis Neg Hx     Social History Social History   Tobacco Use   Smoking status: Former    Current packs/day: 0.00    Types: Cigarettes    Quit date: 10/17/2017    Years since quitting: 5.5    Passive exposure: Never   Smokeless tobacco: Never  Vaping Use   Vaping status: Former  Substance Use Topics   Alcohol use: Yes    Alcohol/week: 3.0 standard drinks of alcohol    Types: 3 Cans of beer per week    Comment: 3-4 beers/day   Drug use: Not Currently     Allergies   Patient has no known allergies.   Review of Systems Review of Systems See HPI  Physical Exam Triage Vital Signs ED Triage Vitals  Encounter Vitals Group     BP 05/22/23 0840 113/80     Systolic BP Percentile --      Diastolic BP Percentile --      Pulse Rate 05/22/23 0840 90     Resp 05/22/23 0840 16     Temp 05/22/23 0840 97.9 F (36.6 C)     Temp Source 05/22/23 0840 Oral     SpO2 05/22/23 0840 98 %     Weight --      Height --      Head Circumference --      Peak Flow --      Pain Score 05/22/23 0842 7     Pain Loc --      Pain Education --      Exclude from Growth Chart --    No data found.  Updated Vital Signs BP 113/80 (BP Location: Right Arm)   Pulse 90   Temp 97.9 F (36.6 C) (Oral)   Resp 16   LMP 05/11/2013   SpO2 98%     Physical Exam Constitutional:      General: She is not in acute distress.    Appearance: She is well-developed. She is not ill-appearing.  HENT:     Head: Normocephalic and atraumatic.      Right Ear: Tympanic membrane and ear canal normal.     Left Ear: Tympanic membrane and ear canal normal.     Nose: No congestion.     Mouth/Throat:     Mouth: Mucous membranes are moist.     Pharynx: Posterior oropharyngeal erythema present.     Tonsils: No tonsillar exudate. 1+ on the right. 1+ on the left.  Eyes:     Conjunctiva/sclera: Conjunctivae normal.     Pupils: Pupils are equal, round, and reactive to light.  Cardiovascular:     Rate and Rhythm: Normal rate.  Pulmonary:     Effort: Pulmonary effort is normal. No respiratory distress.  Abdominal:     General: There is no distension.     Palpations: Abdomen is soft.  Musculoskeletal:        General: Normal range of motion.     Cervical back: Normal range of motion.  Lymphadenopathy:     Cervical: Cervical adenopathy present.  Skin:    General: Skin is warm and dry.  Neurological:     Mental Status: She is alert.      UC Treatments / Results  Labs (all labs ordered are listed, but only abnormal results are displayed) Labs Reviewed  POCT RAPID STREP A (OFFICE)    EKG   Radiology No results found.  Procedures Procedures (including critical care time)  Medications Ordered in UC Medications - No data to display  Initial Impression / Assessment and Plan / UC Course  I have reviewed the triage vital signs and the nursing notes.  Pertinent labs & imaging results that were available during my care of the patient were reviewed by me and considered in my medical decision making (see chart for details).   Likely viral patient is very concerned about delaying her chemotherapy.  She wishes to try antibiotics to see if they will give her any improvement.  I agree to do so given her condition.   Final Clinical Impressions(s) / UC Diagnoses   Final diagnoses:  Acute pharyngitis, unspecified etiology     Discharge Instructions      Drink lots of water Take the amoxicillin 2 x a day until sore throat  clears Use chloraseptic or salt water gargles for pain   ED Prescriptions     Medication Sig Dispense Auth. Provider   amoxicillin (AMOXIL) 875 MG tablet Take 1 tablet (875 mg total) by mouth 2 (two) times daily. 14 tablet Eustace Moore, MD      PDMP not reviewed this encounter.   Eustace Moore, MD 05/22/23 [phone]

## 2023-05-23 ENCOUNTER — Telehealth: Payer: Self-pay | Admitting: Hematology and Oncology

## 2023-05-23 ENCOUNTER — Telehealth: Payer: Self-pay

## 2023-05-23 ENCOUNTER — Ambulatory Visit (INDEPENDENT_AMBULATORY_CARE_PROVIDER_SITE_OTHER): Payer: 59 | Admitting: Family Medicine

## 2023-05-23 VITALS — BP 114/76 | HR 105 | Temp 98.4°F | Ht 63.0 in | Wt 143.0 lb

## 2023-05-23 DIAGNOSIS — U071 COVID-19: Secondary | ICD-10-CM | POA: Diagnosis not present

## 2023-05-23 DIAGNOSIS — J029 Acute pharyngitis, unspecified: Secondary | ICD-10-CM | POA: Diagnosis not present

## 2023-05-23 LAB — POC COVID19 BINAXNOW: SARS Coronavirus 2 Ag: POSITIVE — AB

## 2023-05-23 MED ORDER — PREDNISONE 20 MG PO TABS
40.0000 mg | ORAL_TABLET | Freq: Every day | ORAL | 0 refills | Status: DC
Start: 2023-05-23 — End: 2023-07-12

## 2023-05-23 MED ORDER — NIRMATRELVIR/RITONAVIR (PAXLOVID)TABLET: 3.0000 | ORAL_TABLET | Freq: Two times a day (BID) | ORAL | 0 refills | Status: AC

## 2023-05-23 MED FILL — Dexamethasone Sodium Phosphate Inj 100 MG/10ML: INTRAMUSCULAR | Qty: 1 | Status: AC

## 2023-05-23 MED FILL — Fosaprepitant Dimeglumine For IV Infusion 150 MG (Base Eq): INTRAVENOUS | Qty: 5 | Status: AC

## 2023-05-23 NOTE — Telephone Encounter (Signed)
Patient is aware of rescheduled appointment times/dates; patient is also aware of Covid procedures and guidelines upon arrival

## 2023-05-23 NOTE — Progress Notes (Signed)
Pt was seen at UC. 

## 2023-05-23 NOTE — Progress Notes (Signed)
Acute Office Visit  Subjective:     Patient ID: Lori Mcmillan, female    DOB: 20-Dec-1960, 62 y.o.   MRN: [id number]  Chief Complaint  Patient presents with   Sore Throat    HPI Patient is in today for ST. Went to UC yesterday..  She says symptoms started on Thursday evening she just started to not feel well she could tell she was coming down with something she took some orange juice and Tylenol severe cold and flu.  She then started getting a sore throat and some clear runny nose though she has been having issues with clear runny nose even before she felt sick.  She says now it just feels like it is painful to breathe even in her throat.  She has been doing salt water gargles, tea with honey, and Chloraseptic spray though Chloraseptic spray seems to actually make it worse.  She did have some chills initially but does not think she had a true fever.  She has had a slight cough but is just been very mild.  No chest tightness or few sputum production.  She is currently on her sixth round of chemotherapy for metastatic breast cancer and says she is never had any kind of symptoms similar to this with her chemo she is actually scheduled for her next treatment tomorrow.  Taken 2 doses of the amoxicillin so far.  ROS      Objective:    BP 114/76   Pulse (!) 105   Temp 98.4 F (36.9 C) (Oral)   Ht 5\' 3"  (1.6 m)   Wt 143 lb (64.9 kg)   LMP 05/11/2013   SpO2 97%   BMI 25.33 kg/m    Physical Exam Constitutional:      Appearance: She is well-developed.  HENT:     Head: Normocephalic and atraumatic.     Right Ear: Tympanic membrane, ear canal and external ear normal.     Left Ear: Tympanic membrane, ear canal and external ear normal.     Nose: Nose normal. No congestion.     Mouth/Throat:     Pharynx: Posterior oropharyngeal erythema present.     Tonsils: No tonsillar exudate.  Eyes:     Conjunctiva/sclera: Conjunctivae normal.     Pupils: Pupils are equal, round, and reactive  to light.  Neck:     Thyroid: No thyromegaly.     Comments: Tender over left lateral anterior neck  Cardiovascular:     Rate and Rhythm: Normal rate and regular rhythm.     Heart sounds: Normal heart sounds.  Pulmonary:     Effort: Pulmonary effort is normal.     Breath sounds: Normal breath sounds. No wheezing.  Musculoskeletal:     Cervical back: Neck supple. Tenderness present.  Lymphadenopathy:     Cervical: No cervical adenopathy.  Skin:    General: Skin is warm and dry.  Neurological:     Mental Status: She is alert and oriented to person, place, and time.     Results for orders placed or performed in visit on 05/23/23  POC COVID-19  Result Value Ref Range   SARS Coronavirus 2 Ag Positive (A) Negative        Assessment & Plan:   Problem List Items Addressed This Visit   None Visit Diagnoses     Sore throat    -  Primary   Relevant Orders   Culture, Group A Strep   POC COVID-19 (Completed)   COVID-19  Relevant Medications   nirmatrelvir/ritonavir (PAXLOVID) 20 x 150 MG & 10 x 100MG  TABS     COVID 19- will start paxlovid. Will delay her chemo. Has already reached out to Oncology.  Will add prednisone for relief of her severe throat pain.  Ok to stop the amoxicillin. Ok to continue symptomatic care.   Meds ordered this encounter  Medications   nirmatrelvir/ritonavir (PAXLOVID) 20 x 150 MG & 10 x 100MG  TABS    Sig: Take 3 tablets by mouth 2 (two) times daily for 5 days. (Take nirmatrelvir 150 mg two tablets twice daily for 5 days and ritonavir 100 mg one tablet twice daily for 5 days) Patient GFR is 75    Dispense:  30 tablet    Refill:  0   predniSONE (DELTASONE) 20 MG tablet    Sig: Take 2 tablets (40 mg total) by mouth daily with breakfast.    Dispense:  10 tablet    Refill:  0    No follow-ups on file.  Nani Gasser, MD

## 2023-05-23 NOTE — Telephone Encounter (Signed)
S/w pt via phone with concerns per MyChart message. She was seen in urgent care yesterday and did not have a COVID test. She saw her PCP today where she tested positive for COVID. Appts will need to be r/s to next week and in a bed in infusion. She is aware of all of this and knows scheduling will be in touch. Message sent to scheduling.

## 2023-05-24 ENCOUNTER — Inpatient Hospital Stay: Payer: 59 | Admitting: Hematology and Oncology

## 2023-05-24 ENCOUNTER — Inpatient Hospital Stay: Payer: 59

## 2023-05-26 LAB — CULTURE, GROUP A STREP: Strep A Culture: NEGATIVE

## 2023-05-26 NOTE — Progress Notes (Signed)
Lori Mcmillan, just wanted to let you know that the strep culture was negative.  Hopefully you are already feeling better.

## 2023-05-30 MED FILL — Fosaprepitant Dimeglumine For IV Infusion 150 MG (Base Eq): INTRAVENOUS | Qty: 5 | Status: AC

## 2023-05-30 MED FILL — Dexamethasone Sodium Phosphate Inj 100 MG/10ML: INTRAMUSCULAR | Qty: 1 | Status: AC

## 2023-05-31 ENCOUNTER — Inpatient Hospital Stay: Payer: 59 | Attending: Hematology and Oncology | Admitting: Hematology and Oncology

## 2023-05-31 ENCOUNTER — Other Ambulatory Visit: Payer: Self-pay

## 2023-05-31 ENCOUNTER — Inpatient Hospital Stay: Payer: 59

## 2023-05-31 VITALS — BP 118/60 | HR 64 | Temp 98.4°F | Resp 16 | Wt 141.5 lb

## 2023-05-31 DIAGNOSIS — Z7952 Long term (current) use of systemic steroids: Secondary | ICD-10-CM | POA: Diagnosis not present

## 2023-05-31 DIAGNOSIS — C787 Secondary malignant neoplasm of liver and intrahepatic bile duct: Secondary | ICD-10-CM | POA: Diagnosis not present

## 2023-05-31 DIAGNOSIS — C7800 Secondary malignant neoplasm of unspecified lung: Secondary | ICD-10-CM | POA: Diagnosis not present

## 2023-05-31 DIAGNOSIS — Z923 Personal history of irradiation: Secondary | ICD-10-CM | POA: Insufficient documentation

## 2023-05-31 DIAGNOSIS — Z5112 Encounter for antineoplastic immunotherapy: Secondary | ICD-10-CM | POA: Insufficient documentation

## 2023-05-31 DIAGNOSIS — Z171 Estrogen receptor negative status [ER-]: Secondary | ICD-10-CM | POA: Insufficient documentation

## 2023-05-31 DIAGNOSIS — Z79899 Other long term (current) drug therapy: Secondary | ICD-10-CM | POA: Diagnosis not present

## 2023-05-31 DIAGNOSIS — C50111 Malignant neoplasm of central portion of right female breast: Secondary | ICD-10-CM

## 2023-05-31 LAB — CBC WITH DIFFERENTIAL (CANCER CENTER ONLY)
Abs Immature Granulocytes: 0.04 10*3/uL (ref 0.00–0.07)
Basophils Absolute: 0.1 10*3/uL (ref 0.0–0.1)
Basophils Relative: 1 %
Eosinophils Absolute: 0.1 10*3/uL (ref 0.0–0.5)
Eosinophils Relative: 2 %
HCT: 34.6 % — ABNORMAL LOW (ref 36.0–46.0)
Hemoglobin: 11.7 g/dL — ABNORMAL LOW (ref 12.0–15.0)
Immature Granulocytes: 1 %
Lymphocytes Relative: 27 %
Lymphs Abs: 1.6 10*3/uL (ref 0.7–4.0)
MCH: 31.6 pg (ref 26.0–34.0)
MCHC: 33.8 g/dL (ref 30.0–36.0)
MCV: 93.5 fL (ref 80.0–100.0)
Monocytes Absolute: 0.6 10*3/uL (ref 0.1–1.0)
Monocytes Relative: 10 %
Neutro Abs: 3.7 10*3/uL (ref 1.7–7.7)
Neutrophils Relative %: 59 %
Platelet Count: 277 10*3/uL (ref 150–400)
RBC: 3.7 MIL/uL — ABNORMAL LOW (ref 3.87–5.11)
RDW: 15.1 % (ref 11.5–15.5)
WBC Count: 6.1 10*3/uL (ref 4.0–10.5)
nRBC: 0 % (ref 0.0–0.2)

## 2023-05-31 LAB — CMP (CANCER CENTER ONLY)
ALT: 30 U/L (ref 0–44)
AST: 24 U/L (ref 15–41)
Albumin: 3.8 g/dL (ref 3.5–5.0)
Alkaline Phosphatase: 93 U/L (ref 38–126)
Anion gap: 5 (ref 5–15)
BUN: 9 mg/dL (ref 6–20)
CO2: 24 mmol/L (ref 22–32)
Calcium: 8.4 mg/dL — ABNORMAL LOW (ref 8.9–10.3)
Chloride: 108 mmol/L (ref 98–111)
Creatinine: 0.55 mg/dL (ref 0.44–1.00)
GFR, Estimated: >60 mL/min (ref >60)
Glucose, Bld: 91 mg/dL (ref 70–99)
Potassium: 3.9 mmol/L (ref 3.5–5.1)
Sodium: 137 mmol/L (ref 135–145)
Total Bilirubin: 0.2 mg/dL — ABNORMAL LOW (ref 0.3–1.2)
Total Protein: 6.4 g/dL — ABNORMAL LOW (ref 6.5–8.1)

## 2023-05-31 MED ORDER — HEPARIN SOD (PORK) LOCK FLUSH 100 UNIT/ML IV SOLN
500.0000 [IU] | Freq: Once | INTRAVENOUS | Status: AC | PRN
  Administered 2023-05-31: 500 [IU]

## 2023-05-31 MED ORDER — SODIUM CHLORIDE 0.9 % IV SOLN
10.0000 mg | Freq: Once | INTRAVENOUS | Status: AC
  Administered 2023-05-31: 10 mg via INTRAVENOUS
  Filled 2023-05-31: qty 10
  Filled 2023-05-31: qty 1

## 2023-05-31 MED ORDER — FAM-TRASTUZUMAB DERUXTECAN-NXKI CHEMO 100 MG IV SOLR
340.0000 mg | Freq: Once | INTRAVENOUS | Status: AC
  Administered 2023-05-31: 340 mg via INTRAVENOUS
  Filled 2023-05-31: qty 17

## 2023-05-31 MED ORDER — PALONOSETRON HCL INJECTION 0.25 MG/5ML
0.2500 mg | Freq: Once | INTRAVENOUS | Status: AC
  Administered 2023-05-31: 0.25 mg via INTRAVENOUS
  Filled 2023-05-31: qty 5

## 2023-05-31 MED ORDER — DEXTROSE 5 % IV SOLN: Freq: Once | INTRAVENOUS | Status: AC

## 2023-05-31 MED ORDER — SODIUM CHLORIDE 0.9% FLUSH
10.0000 mL | INTRAVENOUS | Status: DC | PRN
  Administered 2023-05-31: 10 mL

## 2023-05-31 MED ORDER — ACETAMINOPHEN 325 MG PO TABS
650.0000 mg | ORAL_TABLET | Freq: Once | ORAL | Status: AC
  Administered 2023-05-31: 650 mg via ORAL
  Filled 2023-05-31: qty 2

## 2023-05-31 MED ORDER — DIPHENHYDRAMINE HCL 25 MG PO CAPS
25.0000 mg | ORAL_CAPSULE | Freq: Once | ORAL | Status: AC
  Administered 2023-05-31: 25 mg via ORAL
  Filled 2023-05-31: qty 1

## 2023-05-31 MED ORDER — SODIUM CHLORIDE 0.9 % IV SOLN
150.0000 mg | Freq: Once | INTRAVENOUS | Status: AC
  Administered 2023-05-31: 150 mg via INTRAVENOUS
  Filled 2023-05-31: qty 5
  Filled 2023-05-31: qty 150

## 2023-05-31 NOTE — Patient Instructions (Signed)
[facility]  Discharge Instructions: Thank you for choosing [facility] to provide your oncology and hematology care.   If you have a lab appointment with the Cancer Center, please go directly to the Cancer Center and check in at the registration area.   Wear comfortable clothing and clothing appropriate for easy access to any Portacath or PICC line.   We strive to give you quality time with your provider. You may need to reschedule your appointment if you arrive late (15 or more minutes).  Arriving late affects you and other patients whose appointments are after yours.  Also, if you miss three or more appointments without notifying the office, you may be dismissed from the clinic at the provider's discretion.      For prescription refill requests, have your pharmacy contact our office and allow 72 hours for refills to be completed.    Today you received the following chemotherapy and/or immunotherapy agents Enhertu.   To help prevent nausea and vomiting after your treatment, we encourage you to take your nausea medication as directed.  BELOW ARE SYMPTOMS THAT SHOULD BE REPORTED IMMEDIATELY: *FEVER GREATER THAN 100.4 F (38 C) OR HIGHER *CHILLS OR SWEATING *NAUSEA AND VOMITING THAT IS NOT CONTROLLED WITH YOUR NAUSEA MEDICATION *UNUSUAL SHORTNESS OF BREATH *UNUSUAL BRUISING OR BLEEDING *URINARY PROBLEMS (pain or burning when urinating, or frequent urination) *BOWEL PROBLEMS (unusual diarrhea, constipation, pain near the anus) TENDERNESS IN MOUTH AND THROAT WITH OR WITHOUT PRESENCE OF ULCERS (sore throat, sores in mouth, or a toothache) UNUSUAL RASH, SWELLING OR PAIN  UNUSUAL VAGINAL DISCHARGE OR ITCHING   Items with * indicate a potential emergency and should be followed up as soon as possible or go to the Emergency Department if any problems should occur.  Please show the CHEMOTHERAPY ALERT CARD or IMMUNOTHERAPY ALERT CARD at check-in  to the Emergency Department and triage nurse.  Should you have questions after your visit or need to cancel or reschedule your appointment, please contact [facility]  Dept: [phone]  and follow the prompts.  Office hours are 8:00 a.m. to 4:30 p.m. Monday - Friday. Please note that voicemails left after 4:00 p.m. may not be returned until the following business day.  We are closed weekends and major holidays. You have access to a nurse at all times for urgent questions. Please call the main number to the clinic Dept: [phone] and follow the prompts.   For any non-urgent questions, you may also contact your provider using MyChart. We now offer e-Visits for anyone 18 and older to request care online for non-urgent symptoms. For details visit [email or web address].   Also download the MyChart app! Go to the app store, search "MyChart", open the app, select [facility], and log in with your MyChart username and password.   

## 2023-05-31 NOTE — Assessment & Plan Note (Signed)
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  Adjuvant chemotherapy with Taxol and Herceptin followed by Herceptin maintenance completed 07/06/2022 2. breast radiation completed 01/04/2022 3.  Metastatic disease diagnosis: --------------------------------------------------------------------------------------------------------------------- CT chest on 11/01/2022: Right upper lobe nodules largest measuring 5 mm CT chest 12/29/2022: Upper and mid lung zone predominant pulmonary nodules are worrisome for metastatic disease.  Left and right hepatic lobe masses (2.5 cm and 3.9 cm) are new and are worrisome for metastatic disease. PET CT scan 01/17/2023: Hypermetabolic bilobar hepatic (2 masses measuring 2.8 cm SUV 11.1, another mass 4.1 cm SUV 10.3) and pulmonary metastases (right upper lobe lung nodule 5 mm SUV 2.6 right lower lobe 6 mm nodule SUV 1.8)   01/13/23: Liver Biopsy: Met breast cancer ER 0%, PR 0%, Ki67 50%, HER2 3+   Current treatment: Enhertu cycle 6 Chemo toxicities: Mild nausea which resolved with Compazine. Monitoring closely for toxicities.   CT CAP 04/15/2023: Response to treatment in the lung and liver metastases.  (5 mm nodule is 3 mm 6 mm nodule is 2 to 3 mm, right liver subcapsular lesion is 1.7 cm and it used to be 3.5 cm, segment 2 liver lesion 1.3 cm it used to be 2.8 cm)  I recommend obtaining CT chest abdomen pelvis before the next treatment.   Continue with the current treatment and return to clinic every 3 weeks for Enhertu.

## 2023-05-31 NOTE — Progress Notes (Signed)
Patient Care Team: Lori Coombe, DO as PCP - General (Family Medicine) Lori Peak, MD as Consulting Physician (Radiation Oncology) Lori Mcmillan Lori Lea, MD as Consulting Physician (Oncology) [facility] [place] ([facility]hthalmology) Lori Mcmillan, Lori Lei, MD as Referring Physician (Dermatology) Lori Shiver, MD as Referring Physician (Otolaryngology) Lori Croissant, MD as Medical Oncologist (Hematology and Oncology) Lori Bouillon, MD as Consulting Physician (General Surgery)  DIAGNOSIS:  Encounter Diagnosis  Name Primary?   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 No pathogenic variants identified on the [facility] Multi-Cancer Panel+RNA. VUS in POLD1 identified called c.2429C>T. The report date is 07/01/2021.  The Multi-Cancer  Panel + RNA offered by [facility] includes sequencing and/or deletion duplication testing of the following 84 genes: AIP, ALK, APC, ATM, AXIN2,BAP1,  BARD1, BLM, BMPR1A, BRCA1, BRCA2, BRIP1, CASR, CDC73, CDH1, CDK4, CDKN1B, CDKN1C, CDKN2A (p14ARF), CDKN2A (p16INK4a), CEBPA, CHEK2, CTNNA1, DICER1, DIS3L2, EGFR (c.2369C>T, p.Thr790Met variant only), EPCAM (Deletion/duplication testing only), FH, FLCN, GATA2, GPC3, GREM1 (Promoter region deletion/duplication testing only), HOXB13 (c.251G>A, p.Gly84Glu), HRAS, KIT, MAX, MEN1, MET, MITF (c.952G>A, p.Glu318Lys variant only), MLH1, MSH2, MSH3, MSH6, MUTYH, NBN, NF1, NF2, NTHL1, PALB2, PDGFRA, PHOX2B, PMS2, POLD1, POLE, POT1, PRKAR1A, PTCH1, PTEN, RAD50, RAD51C, RAD51D, RB1, RECQL4, RET, RUNX1, SDHAF2, SDHA (sequence changes only), SDHB, SDHC, SDHD, SMAD4, SMARCA4, SMARCB1, SMARCE1, STK11, SUFU, TERC, TERT, TMEM127, TP53, TSC1, TSC2, VHL, WRN and WT1.   06/18/2021 Cancer Staging   Staging form: Breast, AJCC 8th Edition - Pathologic stage from 06/18/2021: Stage IA (pT1c, pN0, cM0, G3, ER-, PR-, HER2+) - Signed by [name], NP on 10/05/2022 Stage prefix: Initial diagnosis Histologic grading system: 3 grade system   07/21/2021 - 07/06/2022 Chemotherapy   Patient is on Treatment Plan : BREAST Paclitaxel + Trastuzumab q7d / Trastuzumab q21d     11/25/2021 - 01/04/2022 Radiation Therapy   Site Technique Total Dose (Gy) Dose per Fx (Gy) Completed Fx Beam Energies  Breast, Right: Breast_R 3D 45/45 1.8 25/25 6XFFF  Breast, Right: Breast_R_Bst specialPort 5.4/5.4 1.8 3/3 12E     12/29/2022 Progression   CT chest on 11/01/2022: Right upper lobe nodules largest measuring 5 mm CT chest 12/29/2022: Upper and mid lung zone predominant pulmonary nodules are worrisome for metastatic disease.  Left and right hepatic lobe masses (2.5 cm and 3.9 cm) are new and are worrisome for metastatic  disease.   01/13/2023 Initial Biopsy   Right liver biopsy: Metastatic carcinoma  with breast primary, ER-, PR-, HER2 +, Ki-67 50%.    01/13/2023 Cancer Staging   Staging form: Breast, AJCC 8th Edition - Pathologic stage from 01/13/2023: Stage IV (pM1, G3, ER-, PR-, HER2+) - Signed by [name], NP on 02/07/2023 Histologic grading system: 3 grade system   01/17/2023 PET scan   PET CT scan 01/17/2023: Hypermetabolic bilobar hepatic (2 masses measuring 2.8 cm SUV 11.1, another mass 4.1 cm SUV 10.3) and pulmonary metastases (right upper lobe lung nodule 5 mm SUV 2.6 right lower lobe 6 mm nodule SUV 1.8)    02/07/2023 -  Chemotherapy   Patient is on Treatment Plan : BREAST METASTATIC Fam-Trastuzumab Deruxtecan-nxki (Enhertu) (5.4) q21d       CHIEF COMPLIANT: Follow-up breast cancer on Enhertu  INTERVAL HISTORY: Lori Mcmillan is a 62 y.o with metastatic breast cancer. Currently on Enhertu.  She was recently diagnosed with COVID-19 infection and was treated with Paxlovid.  She is asymptomatic today.  I saw her in the infusion room prior to starting her treatment.  She is also that she may have acquired COVID infection when she was visiting her mother who was in a nursing home.  She had severe sore throat which resolved after steroids.  She has been losing significant hair and had cut some of the hair in the back.   ALLERGIES:  has No Known Allergies.  MEDICATIONS:  Current Outpatient Medications  Medication Sig Dispense Refill   acetaminophen (TYLENOL) 500 MG tablet Take 500 mg by mouth as needed.     ALPRAZolam (XANAX) 0.25 MG tablet TAKE 1 TABLET BY MOUTH TWICE A DAY AS NEEDED FOR ANXIETY 30 tablet 3   amoxicillin (AMOXIL) 875 MG tablet Take 1 tablet (875 mg total) by mouth 2 (two) times daily. 14 tablet 0   diazepam (VALIUM) 2 MG tablet Take by mouth as needed (dizziness).     Famotidine-Ca Carb-Mag Hydrox (PEPCID COMPLETE PO) Take by mouth as needed.     lidocaine-prilocaine (EMLA) cream Apply to affected area once 30 g 3   ondansetron (ZOFRAN) 8 MG tablet  Take 1 tablet (8 mg total) by mouth every 8 (eight) hours as needed for nausea or vomiting. Start on the third day after chemotherapy. 30 tablet 1   predniSONE (DELTASONE) 20 MG tablet Take 2 tablets (40 mg total) by mouth daily with breakfast. 10 tablet 0   prochlorperazine (COMPAZINE) 10 MG tablet Take 1 tablet (10 mg total) by mouth every 6 (six) hours as needed for nausea or vomiting. 30 tablet 1   No current facility-administered medications for this visit.    PHYSICAL EXAMINATION: ECOG PERFORMANCE STATUS: 1 - Symptomatic but completely ambulatory  There were no vitals filed for this visit. There were no vitals filed for this visit.    LABORATORY DATA:  I have reviewed the data as listed    Latest Ref Rng & Units 05/02/2023   10:24 AM 04/12/2023    9:47 AM 03/21/2023    9:44 AM  CMP  Glucose 70 - 99 mg/dL 78  95  99   BUN 6 - 20 mg/dL 7  7  9    Creatinine 0.44 - 1.00 mg/dL 8.46  9.62  9.52   Sodium 135 - 145 mmol/L 138  138  139   Potassium 3.5 - 5.1 mmol/L 4.2  4.1  4.0   Chloride 98 - 111 mmol/L 108  107  108   CO2 22 - 32 mmol/L 25  26  25    Calcium 8.9 - 10.3 mg/dL 9.0  8.8  9.0   Total Protein 6.5 - 8.1 g/dL 6.6  6.7  6.8   Total Bilirubin 0.3 - 1.2 mg/dL 0.3  0.3  0.3   Alkaline Phos 38 - 126 U/L 90  93  87   AST 15 - 41 U/L 39  32  28   ALT 0 - 44 U/L 43  39  43     Lab Results  Component Value Date   WBC 4.3 05/02/2023   HGB 11.3 (L) 05/02/2023   HCT 34.9 (L) 05/02/2023   MCV 93.8 05/02/2023   PLT 258 05/02/2023   NEUTROABS 2.1 05/02/2023    ASSESSMENT & PLAN:  Malignant neoplasm of central portion of right breast in female, estrogen receptor negative (HCC) 06/18/2021:Right lumpectomy: Grade 3 IDC 1.5 cm, high-grade DCIS, margins negative, 0/2 lymph nodes negative, ER 0%, PR 0%, HER2 3+ positive, Ki-67 30%    (2013 Right breast cancer: Stage Ia ER/PR positive HER2 negative status postlumpectomy, radiation (low risk Oncotype) could not tolerate tamoxifen for  more than 30 days.)   Treatment plan: 1.  Adjuvant chemotherapy with Taxol and Herceptin followed by Herceptin maintenance completed 07/06/2022 2. breast radiation completed 01/04/2022 3.  Metastatic disease diagnosis: --------------------------------------------------------------------------------------------------------------------- CT chest on 11/01/2022: Right upper lobe nodules largest measuring 5 mm CT chest 12/29/2022: Upper and mid lung zone predominant pulmonary nodules are worrisome for metastatic disease.  Left and right hepatic lobe masses (2.5 cm and 3.9 cm) are new and are worrisome for metastatic disease. PET CT scan 01/17/2023: Hypermetabolic bilobar hepatic (2 masses measuring 2.8 cm SUV 11.1, another mass 4.1 cm SUV 10.3) and pulmonary metastases (right upper lobe lung nodule 5 mm SUV 2.6 right lower lobe 6 mm nodule SUV 1.8)   01/13/23: Liver Biopsy: Met breast cancer ER 0%, PR 0%, Ki67 50%, HER2 3+   Current treatment: Enhertu cycle 6 Chemo toxicities: Mild nausea which resolved with Compazine. Monitoring closely for toxicities.   CT CAP 04/15/2023: Response to treatment in the lung and liver metastases.  (5 mm nodule is 3 mm 6 mm nodule is 2 to 3 mm, right liver subcapsular lesion is 1.7 cm and it used to be 3.5 cm, segment 2 liver lesion 1.3 cm it used to be 2.8 cm)  I recommend obtaining CT chest abdomen pelvis before the next treatment.   Continue with the current treatment and return to clinic every 3 weeks for Enhertu.    No orders of the defined types were placed in this encounter.  The patient has a good understanding of the overall plan. she agrees with it. she will call with any problems that may develop before the next visit here. Total time spent: 30 mins including face to face time and time spent for planning, charting and co-ordination of care   Tamsen Meek, MD 05/31/23    I Janan Ridge am acting as a Neurosurgeon for [employer]  I have reviewed  the above documentation for accuracy and completeness, and I agree with the above.

## 2023-06-01 ENCOUNTER — Telehealth: Payer: Self-pay

## 2023-06-01 ENCOUNTER — Telehealth: Payer: Self-pay | Admitting: Hematology and Oncology

## 2023-06-01 NOTE — Telephone Encounter (Signed)
Attempted to call pt and LVM for call back.  Pt has been scheduled for CT CAP per MD 06/13/23 at 230 PM pt will need to be NPO 4 hours prior.  MD f/u and tx is scheduled for 9/3.

## 2023-06-01 NOTE — Telephone Encounter (Signed)
UPDATE PER LAST PHONE NOTE: Per MD, CT  is needed mid-September to stay on schedule with q3 mo scans. Her new CT date is 07/05/23 with MD f/u 9/24. She is aware and verbalized thanks.

## 2023-06-01 NOTE — Telephone Encounter (Signed)
Scheduled appointments per 8/13 los. Patient is aware of all made appointments.

## 2023-06-02 ENCOUNTER — Telehealth: Payer: Self-pay | Admitting: Hematology and Oncology

## 2023-06-02 NOTE — Telephone Encounter (Signed)
Rescheduled appointments per room/resource. Patient is aware of the changes made to her upcoming appointments.

## 2023-06-13 ENCOUNTER — Ambulatory Visit (HOSPITAL_COMMUNITY): Payer: 59

## 2023-06-17 MED FILL — Fosaprepitant Dimeglumine For IV Infusion 150 MG (Base Eq): INTRAVENOUS | Qty: 5 | Status: AC

## 2023-06-17 MED FILL — Dexamethasone Sodium Phosphate Inj 100 MG/10ML: INTRAMUSCULAR | Qty: 1 | Status: AC

## 2023-06-21 ENCOUNTER — Other Ambulatory Visit: Payer: 59

## 2023-06-21 ENCOUNTER — Inpatient Hospital Stay: Payer: 59

## 2023-06-21 ENCOUNTER — Ambulatory Visit: Payer: 59 | Admitting: Adult Health

## 2023-06-21 ENCOUNTER — Inpatient Hospital Stay: Payer: 59 | Attending: Hematology and Oncology

## 2023-06-21 VITALS — BP 117/60 | HR 67 | Temp 98.9°F | Resp 16 | Wt 141.2 lb

## 2023-06-21 DIAGNOSIS — C50111 Malignant neoplasm of central portion of right female breast: Secondary | ICD-10-CM

## 2023-06-21 DIAGNOSIS — Z5112 Encounter for antineoplastic immunotherapy: Secondary | ICD-10-CM | POA: Diagnosis not present

## 2023-06-21 DIAGNOSIS — Z79899 Other long term (current) drug therapy: Secondary | ICD-10-CM | POA: Insufficient documentation

## 2023-06-21 DIAGNOSIS — C787 Secondary malignant neoplasm of liver and intrahepatic bile duct: Secondary | ICD-10-CM | POA: Diagnosis not present

## 2023-06-21 DIAGNOSIS — Z95828 Presence of other vascular implants and grafts: Secondary | ICD-10-CM

## 2023-06-21 LAB — CMP (CANCER CENTER ONLY)
ALT: 44 U/L (ref 0–44)
AST: 39 U/L (ref 15–41)
Albumin: 3.9 g/dL (ref 3.5–5.0)
Alkaline Phosphatase: 121 U/L (ref 38–126)
Anion gap: 6 (ref 5–15)
BUN: 7 mg/dL (ref 6–20)
CO2: 24 mmol/L (ref 22–32)
Calcium: 9 mg/dL (ref 8.9–10.3)
Chloride: 110 mmol/L (ref 98–111)
Creatinine: 0.66 mg/dL (ref 0.44–1.00)
GFR, Estimated: >60 mL/min (ref >60)
Glucose, Bld: 90 mg/dL (ref 70–99)
Potassium: 3.8 mmol/L (ref 3.5–5.1)
Sodium: 140 mmol/L (ref 135–145)
Total Bilirubin: 0.3 mg/dL (ref 0.3–1.2)
Total Protein: 6.6 g/dL (ref 6.5–8.1)

## 2023-06-21 LAB — CBC WITH DIFFERENTIAL (CANCER CENTER ONLY)
Abs Immature Granulocytes: 0 10*3/uL (ref 0.00–0.07)
Basophils Absolute: 0 10*3/uL (ref 0.0–0.1)
Basophils Relative: 1 %
Eosinophils Absolute: 0.2 10*3/uL (ref 0.0–0.5)
Eosinophils Relative: 4 %
HCT: 34.8 % — ABNORMAL LOW (ref 36.0–46.0)
Hemoglobin: 11.6 g/dL — ABNORMAL LOW (ref 12.0–15.0)
Immature Granulocytes: 0 %
Lymphocytes Relative: 33 %
Lymphs Abs: 1.3 10*3/uL (ref 0.7–4.0)
MCH: 31.8 pg (ref 26.0–34.0)
MCHC: 33.3 g/dL (ref 30.0–36.0)
MCV: 95.3 fL (ref 80.0–100.0)
Monocytes Absolute: 0.5 10*3/uL (ref 0.1–1.0)
Monocytes Relative: 12 %
Neutro Abs: 2 10*3/uL (ref 1.7–7.7)
Neutrophils Relative %: 50 %
Platelet Count: 240 10*3/uL (ref 150–400)
RBC: 3.65 MIL/uL — ABNORMAL LOW (ref 3.87–5.11)
RDW: 15.3 % (ref 11.5–15.5)
WBC Count: 3.9 10*3/uL — ABNORMAL LOW (ref 4.0–10.5)
nRBC: 0 % (ref 0.0–0.2)

## 2023-06-21 MED ORDER — SODIUM CHLORIDE 0.9% FLUSH
10.0000 mL | Freq: Once | INTRAVENOUS | Status: AC
  Administered 2023-06-21: 10 mL

## 2023-06-21 MED ORDER — DEXTROSE 5 % IV SOLN: Freq: Once | INTRAVENOUS | Status: AC

## 2023-06-21 MED ORDER — DIPHENHYDRAMINE HCL 25 MG PO CAPS
25.0000 mg | ORAL_CAPSULE | Freq: Once | ORAL | Status: AC
  Administered 2023-06-21: 25 mg via ORAL
  Filled 2023-06-21: qty 1

## 2023-06-21 MED ORDER — SODIUM CHLORIDE 0.9 % IV SOLN
150.0000 mg | Freq: Once | INTRAVENOUS | Status: AC
  Administered 2023-06-21: 150 mg via INTRAVENOUS
  Filled 2023-06-21: qty 150

## 2023-06-21 MED ORDER — ACETAMINOPHEN 325 MG PO TABS
650.0000 mg | ORAL_TABLET | Freq: Once | ORAL | Status: AC
  Administered 2023-06-21: 650 mg via ORAL
  Filled 2023-06-21: qty 2

## 2023-06-21 MED ORDER — SODIUM CHLORIDE 0.9% FLUSH
10.0000 mL | INTRAVENOUS | Status: DC | PRN
  Administered 2023-06-21: 10 mL

## 2023-06-21 MED ORDER — HEPARIN SOD (PORK) LOCK FLUSH 100 UNIT/ML IV SOLN
500.0000 [IU] | Freq: Once | INTRAVENOUS | Status: AC | PRN
  Administered 2023-06-21: 500 [IU]

## 2023-06-21 MED ORDER — FAM-TRASTUZUMAB DERUXTECAN-NXKI CHEMO 100 MG IV SOLR
5.4000 mg/kg | Freq: Once | INTRAVENOUS | Status: AC
  Administered 2023-06-21: 340 mg via INTRAVENOUS
  Filled 2023-06-21: qty 17

## 2023-06-21 MED ORDER — PALONOSETRON HCL INJECTION 0.25 MG/5ML
0.2500 mg | Freq: Once | INTRAVENOUS | Status: AC
  Administered 2023-06-21: 0.25 mg via INTRAVENOUS
  Filled 2023-06-21: qty 5

## 2023-06-21 MED ORDER — SODIUM CHLORIDE 0.9 % IV SOLN
10.0000 mg | Freq: Once | INTRAVENOUS | Status: AC
  Administered 2023-06-21: 10 mg via INTRAVENOUS
  Filled 2023-06-21: qty 10

## 2023-06-21 NOTE — Patient Instructions (Signed)
[facility]  Discharge Instructions: Thank you for choosing [facility] to provide your oncology and hematology care.   If you have a lab appointment with the Cancer Center, please go directly to the Cancer Center and check in at the registration area.   Wear comfortable clothing and clothing appropriate for easy access to any Portacath or PICC line.   We strive to give you quality time with your provider. You may need to reschedule your appointment if you arrive late (15 or more minutes).  Arriving late affects you and other patients whose appointments are after yours.  Also, if you miss three or more appointments without notifying the office, you may be dismissed from the clinic at the provider's discretion.      For prescription refill requests, have your pharmacy contact our office and allow 72 hours for refills to be completed.    Today you received the following chemotherapy and/or immunotherapy agents Enhertu.   To help prevent nausea and vomiting after your treatment, we encourage you to take your nausea medication as directed.  BELOW ARE SYMPTOMS THAT SHOULD BE REPORTED IMMEDIATELY: *FEVER GREATER THAN 100.4 F (38 C) OR HIGHER *CHILLS OR SWEATING *NAUSEA AND VOMITING THAT IS NOT CONTROLLED WITH YOUR NAUSEA MEDICATION *UNUSUAL SHORTNESS OF BREATH *UNUSUAL BRUISING OR BLEEDING *URINARY PROBLEMS (pain or burning when urinating, or frequent urination) *BOWEL PROBLEMS (unusual diarrhea, constipation, pain near the anus) TENDERNESS IN MOUTH AND THROAT WITH OR WITHOUT PRESENCE OF ULCERS (sore throat, sores in mouth, or a toothache) UNUSUAL RASH, SWELLING OR PAIN  UNUSUAL VAGINAL DISCHARGE OR ITCHING   Items with * indicate a potential emergency and should be followed up as soon as possible or go to the Emergency Department if any problems should occur.  Please show the CHEMOTHERAPY ALERT CARD or IMMUNOTHERAPY ALERT CARD at check-in  to the Emergency Department and triage nurse.  Should you have questions after your visit or need to cancel or reschedule your appointment, please contact [facility]  Dept: [phone]  and follow the prompts.  Office hours are 8:00 a.m. to 4:30 p.m. Monday - Friday. Please note that voicemails left after 4:00 p.m. may not be returned until the following business day.  We are closed weekends and major holidays. You have access to a nurse at all times for urgent questions. Please call the main number to the clinic Dept: [phone] and follow the prompts.   For any non-urgent questions, you may also contact your provider using MyChart. We now offer e-Visits for anyone 18 and older to request care online for non-urgent symptoms. For details visit [email or web address].   Also download the MyChart app! Go to the app store, search "MyChart", open the app, select [facility], and log in with your MyChart username and password.   

## 2023-07-05 ENCOUNTER — Encounter (HOSPITAL_COMMUNITY): Payer: Self-pay

## 2023-07-05 ENCOUNTER — Ambulatory Visit (HOSPITAL_COMMUNITY)
Admission: RE | Admit: 2023-07-05 | Discharge: 2023-07-05 | Disposition: A | Discharge status: Home / Self Care | Payer: 59 | Source: Ambulatory Visit | Attending: Hematology and Oncology | Admitting: Hematology and Oncology

## 2023-07-05 DIAGNOSIS — R918 Other nonspecific abnormal finding of lung field: Secondary | ICD-10-CM | POA: Diagnosis not present

## 2023-07-05 DIAGNOSIS — C50111 Malignant neoplasm of central portion of right female breast: Secondary | ICD-10-CM | POA: Insufficient documentation

## 2023-07-05 DIAGNOSIS — C50919 Malignant neoplasm of unspecified site of unspecified female breast: Secondary | ICD-10-CM | POA: Diagnosis not present

## 2023-07-05 DIAGNOSIS — C787 Secondary malignant neoplasm of liver and intrahepatic bile duct: Secondary | ICD-10-CM | POA: Diagnosis not present

## 2023-07-05 DIAGNOSIS — Z171 Estrogen receptor negative status [ER-]: Secondary | ICD-10-CM | POA: Diagnosis not present

## 2023-07-05 DIAGNOSIS — I700 Atherosclerosis of aorta: Secondary | ICD-10-CM | POA: Diagnosis not present

## 2023-07-05 MED ORDER — HEPARIN SOD (PORK) LOCK FLUSH 100 UNIT/ML IV SOLN
INTRAVENOUS | Status: AC
  Filled 2023-07-05: qty 5

## 2023-07-05 MED ORDER — SODIUM CHLORIDE (PF) 0.9 % IJ SOLN
INTRAMUSCULAR | Status: AC
  Filled 2023-07-05: qty 50

## 2023-07-05 MED ORDER — IOHEXOL 300 MG/ML  SOLN
100.0000 mL | Freq: Once | INTRAMUSCULAR | Status: AC | PRN
  Administered 2023-07-05: 100 mL via INTRAVENOUS

## 2023-07-05 MED ORDER — HEPARIN SOD (PORK) LOCK FLUSH 100 UNIT/ML IV SOLN
500.0000 [IU] | Freq: Once | INTRAVENOUS | Status: AC
  Administered 2023-07-05: 500 [IU] via INTRAVENOUS

## 2023-07-11 MED FILL — Fosaprepitant Dimeglumine For IV Infusion 150 MG (Base Eq): INTRAVENOUS | Qty: 5 | Status: AC

## 2023-07-11 MED FILL — Dexamethasone Sodium Phosphate Inj 100 MG/10ML: INTRAMUSCULAR | Qty: 1 | Status: AC

## 2023-07-12 ENCOUNTER — Inpatient Hospital Stay (HOSPITAL_BASED_OUTPATIENT_CLINIC_OR_DEPARTMENT_OTHER): Payer: 59 | Admitting: Hematology and Oncology

## 2023-07-12 ENCOUNTER — Inpatient Hospital Stay: Payer: 59

## 2023-07-12 VITALS — BP 118/84 | HR 73 | Temp 97.8°F | Resp 18 | Ht 63.0 in | Wt 138.5 lb

## 2023-07-12 VITALS — BP 117/68 | HR 69

## 2023-07-12 DIAGNOSIS — Z171 Estrogen receptor negative status [ER-]: Secondary | ICD-10-CM | POA: Diagnosis not present

## 2023-07-12 DIAGNOSIS — Z5112 Encounter for antineoplastic immunotherapy: Secondary | ICD-10-CM | POA: Diagnosis not present

## 2023-07-12 DIAGNOSIS — C787 Secondary malignant neoplasm of liver and intrahepatic bile duct: Secondary | ICD-10-CM | POA: Diagnosis not present

## 2023-07-12 DIAGNOSIS — Z79899 Other long term (current) drug therapy: Secondary | ICD-10-CM | POA: Diagnosis not present

## 2023-07-12 DIAGNOSIS — C50111 Malignant neoplasm of central portion of right female breast: Secondary | ICD-10-CM

## 2023-07-12 LAB — CBC WITH DIFFERENTIAL (CANCER CENTER ONLY)
Abs Immature Granulocytes: 0.01 10*3/uL (ref 0.00–0.07)
Basophils Absolute: 0 10*3/uL (ref 0.0–0.1)
Basophils Relative: 1 %
Eosinophils Absolute: 0.2 10*3/uL (ref 0.0–0.5)
Eosinophils Relative: 3 %
HCT: 36.3 % (ref 36.0–46.0)
Hemoglobin: 11.8 g/dL — ABNORMAL LOW (ref 12.0–15.0)
Immature Granulocytes: 0 %
Lymphocytes Relative: 38 %
Lymphs Abs: 2 10*3/uL (ref 0.7–4.0)
MCH: 30.6 pg (ref 26.0–34.0)
MCHC: 32.5 g/dL (ref 30.0–36.0)
MCV: 94.3 fL (ref 80.0–100.0)
Monocytes Absolute: 0.7 10*3/uL (ref 0.1–1.0)
Monocytes Relative: 13 %
Neutro Abs: 2.4 10*3/uL (ref 1.7–7.7)
Neutrophils Relative %: 45 %
Platelet Count: 246 10*3/uL (ref 150–400)
RBC: 3.85 MIL/uL — ABNORMAL LOW (ref 3.87–5.11)
RDW: 14.6 % (ref 11.5–15.5)
WBC Count: 5.2 10*3/uL (ref 4.0–10.5)
nRBC: 0 % (ref 0.0–0.2)

## 2023-07-12 LAB — CMP (CANCER CENTER ONLY)
ALT: 30 U/L (ref 0–44)
AST: 31 U/L (ref 15–41)
Albumin: 3.9 g/dL (ref 3.5–5.0)
Alkaline Phosphatase: 111 U/L (ref 38–126)
Anion gap: 6 (ref 5–15)
BUN: 7 mg/dL (ref 6–20)
CO2: 25 mmol/L (ref 22–32)
Calcium: 8.9 mg/dL (ref 8.9–10.3)
Chloride: 107 mmol/L (ref 98–111)
Creatinine: 0.67 mg/dL (ref 0.44–1.00)
GFR, Estimated: >60 mL/min (ref >60)
Glucose, Bld: 87 mg/dL (ref 70–99)
Potassium: 3.9 mmol/L (ref 3.5–5.1)
Sodium: 138 mmol/L (ref 135–145)
Total Bilirubin: 0.3 mg/dL (ref 0.3–1.2)
Total Protein: 6.5 g/dL (ref 6.5–8.1)

## 2023-07-12 MED ORDER — HEPARIN SOD (PORK) LOCK FLUSH 100 UNIT/ML IV SOLN
500.0000 [IU] | Freq: Once | INTRAVENOUS | Status: AC | PRN
  Administered 2023-07-12: 500 [IU]

## 2023-07-12 MED ORDER — SODIUM CHLORIDE 0.9% FLUSH
10.0000 mL | INTRAVENOUS | Status: DC | PRN
  Administered 2023-07-12: 10 mL

## 2023-07-12 MED ORDER — FAM-TRASTUZUMAB DERUXTECAN-NXKI CHEMO 100 MG IV SOLR
5.4000 mg/kg | Freq: Once | INTRAVENOUS | Status: AC
  Administered 2023-07-12: 340 mg via INTRAVENOUS
  Filled 2023-07-12: qty 17

## 2023-07-12 MED ORDER — PALONOSETRON HCL INJECTION 0.25 MG/5ML
0.2500 mg | Freq: Once | INTRAVENOUS | Status: AC
  Administered 2023-07-12: 0.25 mg via INTRAVENOUS
  Filled 2023-07-12: qty 5

## 2023-07-12 MED ORDER — DEXTROSE 5 % IV SOLN: Freq: Once | INTRAVENOUS | Status: AC

## 2023-07-12 MED ORDER — SODIUM CHLORIDE 0.9 % IV SOLN
10.0000 mg | Freq: Once | INTRAVENOUS | Status: AC
  Administered 2023-07-12: 10 mg via INTRAVENOUS
  Filled 2023-07-12: qty 10

## 2023-07-12 MED ORDER — SODIUM CHLORIDE 0.9 % IV SOLN
150.0000 mg | Freq: Once | INTRAVENOUS | Status: AC
  Administered 2023-07-12: 150 mg via INTRAVENOUS
  Filled 2023-07-12: qty 150

## 2023-07-12 MED ORDER — DIPHENHYDRAMINE HCL 25 MG PO CAPS
25.0000 mg | ORAL_CAPSULE | Freq: Once | ORAL | Status: AC
  Administered 2023-07-12: 25 mg via ORAL
  Filled 2023-07-12: qty 1

## 2023-07-12 MED ORDER — ACETAMINOPHEN 325 MG PO TABS
650.0000 mg | ORAL_TABLET | Freq: Once | ORAL | Status: AC
  Administered 2023-07-12: 650 mg via ORAL
  Filled 2023-07-12: qty 2

## 2023-07-12 NOTE — Progress Notes (Signed)
Patient Care Team: Everrett Coombe, DO as PCP - General (Family Medicine) [name], MD as Consulting Physician (Radiation Oncology) Barbaraann Cao Georgeanna Lea, MD as Consulting Physician (Oncology) [facility] [place] ([facility]) Domenic Schwab, Lucianne Lei, MD as Referring Physician (Dermatology) Graylin Shiver, MD as Referring Physician (Otolaryngology) Serena Croissant, MD as Medical Oncologist (Hematology and Oncology) Harriette Bouillon, MD as Consulting Physician (General Surgery)  DIAGNOSIS:  Encounter Diagnoses  Name Primary?   Malignant neoplasm of central portion of right breast in female, estrogen receptor negative (HCC) Yes   Metastases to the liver (HCC)     SUMMARY OF ONCOLOGIC HISTORY: Oncology History  Malignant neoplasm of central portion of right breast in female, estrogen receptor negative (HCC)  01/04/2012 Initial Diagnosis   Right breast cancer: Stage Ia ER/PR positive HER2 negative status postlumpectomy, radiation (low risk Oncotype) could not tolerate tamoxifen for more than 30 days.   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 No pathogenic variants identified on the [facility] Multi-Cancer Panel+RNA. VUS in POLD1 identified called c.2429C>T. The report date  is 07/01/2021.  The Multi-Cancer Panel + RNA offered by [facility] includes sequencing and/or deletion duplication testing of the following 84 genes: AIP, ALK, APC, ATM, AXIN2,BAP1,  BARD1, BLM, BMPR1A, BRCA1, BRCA2, BRIP1, CASR, CDC73, CDH1, CDK4, CDKN1B, CDKN1C, CDKN2A (p14ARF), CDKN2A (p16INK4a), CEBPA, CHEK2, CTNNA1, DICER1, DIS3L2, EGFR (c.2369C>T, p.Thr790Met variant only), EPCAM (Deletion/duplication testing only), FH, FLCN, GATA2, GPC3, GREM1 (Promoter region deletion/duplication testing only), HOXB13 (c.251G>A, p.Gly84Glu), HRAS, KIT, MAX, MEN1, MET, MITF (c.952G>A, p.Glu318Lys variant only), MLH1, MSH2, MSH3, MSH6, MUTYH, NBN, NF1, NF2, NTHL1, PALB2, PDGFRA, PHOX2B, PMS2, POLD1, POLE, POT1, PRKAR1A, PTCH1, PTEN, RAD50, RAD51C, RAD51D, RB1, RECQL4, RET, RUNX1, SDHAF2, SDHA (sequence changes only), SDHB, SDHC, SDHD, SMAD4, SMARCA4, SMARCB1, SMARCE1, STK11, SUFU, TERC, TERT, TMEM127, TP53, TSC1, TSC2, VHL, WRN and WT1.   06/18/2021 Cancer Staging   Staging form: Breast, AJCC 8th Edition - Pathologic stage from 06/18/2021: Stage IA (pT1c, pN0, cM0, G3, ER-, PR-, HER2+) - Signed by [name], NP on 10/05/2022 Stage prefix: Initial diagnosis Histologic grading system: 3 grade system   07/21/2021 - 07/06/2022 Chemotherapy   Patient is on Treatment Plan : BREAST Paclitaxel + Trastuzumab q7d / Trastuzumab q21d     11/25/2021 - 01/04/2022 Radiation Therapy   Site Technique Total Dose (Gy) Dose per Fx (Gy) Completed Fx Beam Energies  Breast, Right: Breast_R 3D 45/45 1.8 25/25 6XFFF  Breast, Right: Breast_R_Bst specialPort 5.4/5.4 1.8 3/3 12E     12/29/2022 Progression   CT chest on 11/01/2022: Right upper lobe nodules largest measuring 5 mm CT chest 12/29/2022: Upper and mid lung zone predominant pulmonary nodules are worrisome for metastatic disease.  Left and right hepatic lobe masses (2.5 cm and 3.9  cm) are new and are worrisome for metastatic disease.   01/13/2023 Initial Biopsy   Right  liver biopsy: Metastatic carcinoma with breast primary, ER-, PR-, HER2 +, Ki-67 50%.    01/13/2023 Cancer Staging   Staging form: Breast, AJCC 8th Edition - Pathologic stage from 01/13/2023: Stage IV (pM1, G3, ER-, PR-, HER2+) - Signed by [name], NP on 02/07/2023 Histologic grading system: 3 grade system   01/17/2023 PET scan   PET CT scan 01/17/2023: Hypermetabolic bilobar hepatic (2 masses measuring 2.8 cm SUV 11.1, another mass 4.1 cm SUV 10.3) and pulmonary metastases (right upper lobe lung nodule 5 mm SUV 2.6 right lower lobe 6 mm nodule SUV 1.8)    02/07/2023 -  Chemotherapy   Patient is on Treatment Plan : BREAST METASTATIC Fam-Trastuzumab Deruxtecan-nxki (Enhertu) (5.4) q21d       CHIEF COMPLIANT:   Discussed the use of AI scribe software for clinical note transcription with the patient, who gave verbal consent to proceed.  History of Present Illness   The patient, with a known diagnosis of liver metastases from metastatic breast cancer, presents for a follow-up visit after a recent CT scan. She has been undergoing treatment with Enhertu, which has been well-tolerated with manageable side effects. The patient reports experiencing flushed cheeks, increased thirst, a mild sour stomach, fatigue, irritability, loss of appetite, and occasional nausea. These side effects appear to follow a consistent pattern after each treatment, with fatigue and irritability peaking around the third day post-treatment, loss of appetite leading to weight loss, and nausea occurring around day 10 and day 13. Despite these side effects, the patient has been able to maintain a healthy lifestyle, including a diet of fresh fruits and vegetables and regular exercise.         ALLERGIES:  has No Known Allergies.  MEDICATIONS:  Current Outpatient Medications  Medication Sig Dispense Refill   acetaminophen (TYLENOL) 500 MG tablet Take 500 mg by mouth as needed.     ALPRAZolam (XANAX) 0.25 MG tablet  TAKE 1 TABLET BY MOUTH TWICE A DAY AS NEEDED FOR ANXIETY 30 tablet 3   diazepam (VALIUM) 2 MG tablet Take by mouth as needed (dizziness).     Famotidine-Ca Carb-Mag Hydrox (PEPCID COMPLETE PO) Take by mouth as needed.     lidocaine-prilocaine (EMLA) cream Apply to affected area once 30 g 3   ondansetron (ZOFRAN) 8 MG tablet Take 1 tablet (8 mg total) by mouth every 8 (eight) hours as needed for nausea or vomiting. Start on the third day after chemotherapy. 30 tablet 1   prochlorperazine (COMPAZINE) 10 MG tablet Take 1 tablet (10 mg total) by mouth every 6 (six) hours as needed for nausea or vomiting. 30 tablet 1   No current facility-administered medications for this visit.    PHYSICAL EXAMINATION: ECOG PERFORMANCE STATUS: 1 - Symptomatic but completely ambulatory  Vitals:   07/12/23 0825  BP: 118/84  Pulse: 73  Resp: 18  Temp: 97.8 F (36.6 C)  SpO2: 99%   Filed Weights   07/12/23 0825  Weight: 138 lb 8 oz (62.8 kg)     LABORATORY DATA:  I have reviewed the data as listed    Latest Ref Rng & Units 06/21/2023    9:03 AM 05/31/2023   12:55 PM 05/02/2023   10:24 AM  CMP  Glucose 70 - 99 mg/dL 90  91  78   BUN 6 - 20 mg/dL 7  9  7    Creatinine 0.44 -  1.00 mg/dL 7.84  6.96  2.95   Sodium 135 - 145 mmol/L 140  137  138   Potassium 3.5 - 5.1 mmol/L 3.8  3.9  4.2   Chloride 98 - 111 mmol/L 110  108  108   CO2 22 - 32 mmol/L 24  24  25    Calcium 8.9 - 10.3 mg/dL 9.0  8.4  9.0   Total Protein 6.5 - 8.1 g/dL 6.6  6.4  6.6   Total Bilirubin 0.3 - 1.2 mg/dL 0.3  0.2  0.3   Alkaline Phos 38 - 126 U/L 121  93  90   AST 15 - 41 U/L 39  24  39   ALT 0 - 44 U/L 44  30  43     Lab Results  Component Value Date   WBC 5.2 07/12/2023   HGB 11.8 (L) 07/12/2023   HCT 36.3 07/12/2023   MCV 94.3 07/12/2023   PLT 246 07/12/2023   NEUTROABS 2.4 07/12/2023    ASSESSMENT & PLAN:  Malignant neoplasm of central portion of right breast in female, estrogen receptor negative  (HCC) 06/18/2021:Right lumpectomy: 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  Adjuvant chemotherapy with Taxol and Herceptin followed by Herceptin maintenance completed 07/06/2022 2. breast radiation completed 01/04/2022 3.  Metastatic disease diagnosis: --------------------------------------------------------------------------------------------------------------------- CT chest on 11/01/2022: Right upper lobe nodules largest measuring 5 mm CT chest 12/29/2022: Upper and mid lung zone predominant pulmonary nodules are worrisome for metastatic disease.  Left and right hepatic lobe masses (2.5 cm and 3.9 cm) are new and are worrisome for metastatic disease. PET CT scan 01/17/2023: Hypermetabolic bilobar hepatic (2 masses measuring 2.8 cm SUV 11.1, another mass 4.1 cm SUV 10.3) and pulmonary metastases (right upper lobe lung nodule 5 mm SUV 2.6 right lower lobe 6 mm nodule SUV 1.8)   01/13/23: Liver Biopsy: Met breast cancer ER 0%, PR 0%, Ki67 50%, HER2 3+   Current treatment: Enhertu cycle 7 Chemo toxicities: Mild nausea which resolved with Compazine. Monitoring closely for toxicities.   CT CAP 04/15/2023: Response to treatment in the lung and liver metastases.  (5 mm nodule is 3 mm 6 mm nodule is 2 to 3 mm, right liver subcapsular lesion is 1.7 cm and it used to be 3.5 cm, segment 2 liver lesion 1.3 cm it used to be 2.8 cm)   CT CAP 07/05/2023: Decrease in size of liver mets 0.2 cm was 1.7 cm, 1.1 cm was previously 1.3 cm.  Stable small lung nodules.   Continue with the current treatment and return to clinic every 3 weeks for Enhertu.        Orders Placed This Encounter  Procedures   NM PET Image Restag (PS) Skull Base To  Thigh    Standing Status:   Future    Standing Expiration Date:   07/11/2024    Order Specific Question:   If indicated for the ordered procedure, I authorize the administration of a radiopharmaceutical per Radiology protocol    Answer:   Yes    Order Specific Question:   Is the patient pregnant?    Answer:   No    Order Specific Question:   Preferred imaging location?  Answer:   [facility]    Order Specific Question:   Release to patient    Answer:   Immediate   The patient has a good understanding of the overall plan. she agrees with it. she will call with any problems that may develop before the next visit here. Total time spent: 30 mins including face to face time and time spent for planning, charting and co-ordination of care   Tamsen Meek, MD 07/12/23

## 2023-07-12 NOTE — Patient Instructions (Signed)
[facility]  Discharge Instructions: Thank you for choosing [facility] to provide your oncology and hematology care.   If you have a lab appointment with the Cancer Center, please go directly to the Cancer Center and check in at the registration area.   Wear comfortable clothing and clothing appropriate for easy access to any Portacath or PICC line.   We strive to give you quality time with your provider. You may need to reschedule your appointment if you arrive late (15 or more minutes).  Arriving late affects you and other patients whose appointments are after yours.  Also, if you miss three or more appointments without notifying the office, you may be dismissed from the clinic at the provider's discretion.      For prescription refill requests, have your pharmacy contact our office and allow 72 hours for refills to be completed.    Today you received the following chemotherapy and/or immunotherapy agents: Enhertu      To help prevent nausea and vomiting after your treatment, we encourage you to take your nausea medication as directed.  BELOW ARE SYMPTOMS THAT SHOULD BE REPORTED IMMEDIATELY: *FEVER GREATER THAN 100.4 F (38 C) OR HIGHER *CHILLS OR SWEATING *NAUSEA AND VOMITING THAT IS NOT CONTROLLED WITH YOUR NAUSEA MEDICATION *UNUSUAL SHORTNESS OF BREATH *UNUSUAL BRUISING OR BLEEDING *URINARY PROBLEMS (pain or burning when urinating, or frequent urination) *BOWEL PROBLEMS (unusual diarrhea, constipation, pain near the anus) TENDERNESS IN MOUTH AND THROAT WITH OR WITHOUT PRESENCE OF ULCERS (sore throat, sores in mouth, or a toothache) UNUSUAL RASH, SWELLING OR PAIN  UNUSUAL VAGINAL DISCHARGE OR ITCHING   Items with * indicate a potential emergency and should be followed up as soon as possible or go to the Emergency Department if any problems should occur.  Please show the CHEMOTHERAPY ALERT CARD or IMMUNOTHERAPY ALERT CARD at  check-in to the Emergency Department and triage nurse.  Should you have questions after your visit or need to cancel or reschedule your appointment, please contact [facility]  Dept: [phone]  and follow the prompts.  Office hours are 8:00 a.m. to 4:30 p.m. Monday - Friday. Please note that voicemails left after 4:00 p.m. may not be returned until the following business day.  We are closed weekends and major holidays. You have access to a nurse at all times for urgent questions. Please call the main number to the clinic Dept: [phone] and follow the prompts.   For any non-urgent questions, you may also contact your provider using MyChart. We now offer e-Visits for anyone 27 and older to request care online for non-urgent symptoms. For details visit [email or web address].   Also download the MyChart app! Go to the app store, search "MyChart", open the app, select [place], and log in with your MyChart username and password.

## 2023-07-12 NOTE — Assessment & Plan Note (Addendum)
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  Adjuvant chemotherapy with Taxol and Herceptin followed by Herceptin maintenance completed 07/06/2022 2. breast radiation completed 01/04/2022 3.  Metastatic disease diagnosis: --------------------------------------------------------------------------------------------------------------------- CT chest on 11/01/2022: Right upper lobe nodules largest measuring 5 mm CT chest 12/29/2022: Upper and mid lung zone predominant pulmonary nodules are worrisome for metastatic disease.  Left and right hepatic lobe masses (2.5 cm and 3.9 cm) are new and are worrisome for metastatic disease. PET CT scan 01/17/2023: Hypermetabolic bilobar hepatic (2 masses measuring 2.8 cm SUV 11.1, another mass 4.1 cm SUV 10.3) and pulmonary metastases (right upper lobe lung nodule 5 mm SUV 2.6 right lower lobe 6 mm nodule SUV 1.8)   01/13/23: Liver Biopsy: Met breast cancer ER 0%, PR 0%, Ki67 50%, HER2 3+   Current treatment: Enhertu cycle 7 Chemo toxicities: Mild nausea which resolved with Compazine. Monitoring closely for toxicities.   CT CAP 04/15/2023: Response to treatment in the lung and liver metastases.  (5 mm nodule is 3 mm 6 mm nodule is 2 to 3 mm, right liver subcapsular lesion is 1.7 cm and it used to be 3.5 cm, segment 2 liver lesion 1.3 cm it used to be 2.8 cm)   CT CAP 07/05/2023: Decrease in size of liver mets 0.2 cm was 1.7 cm, 1.1 cm was previously 1.3 cm.  Stable small lung nodules.   Continue with the current treatment and return to clinic every 3 weeks for Enhertu.

## 2023-07-20 ENCOUNTER — Encounter: Payer: Self-pay | Admitting: Family Medicine

## 2023-07-20 ENCOUNTER — Ambulatory Visit (INDEPENDENT_AMBULATORY_CARE_PROVIDER_SITE_OTHER): Payer: 59 | Admitting: Family Medicine

## 2023-07-20 VITALS — BP 128/76 | HR 97 | Ht 63.0 in | Wt 136.0 lb

## 2023-07-20 DIAGNOSIS — W57XXXA Bitten or stung by nonvenomous insect and other nonvenomous arthropods, initial encounter: Secondary | ICD-10-CM | POA: Insufficient documentation

## 2023-07-20 DIAGNOSIS — Z23 Encounter for immunization: Secondary | ICD-10-CM

## 2023-07-20 DIAGNOSIS — S80869A Insect bite (nonvenomous), unspecified lower leg, initial encounter: Secondary | ICD-10-CM | POA: Diagnosis not present

## 2023-07-20 MED ORDER — TRIAMCINOLONE ACETONIDE 0.1 % EX CREA: 1.0000 | TOPICAL_CREAM | Freq: Two times a day (BID) | CUTANEOUS | 0 refills | Status: DC

## 2023-07-20 NOTE — Assessment & Plan Note (Addendum)
Lesions consistent with insect bites.  Adding topical triamcinolone cream.  If not improving with this she will let me know.  Discussed s/s of infection.

## 2023-07-20 NOTE — Progress Notes (Signed)
Lori Mcmillan - 62 y.o. female MRN [id number]  Date of birth: 1961/05/04  Subjective No chief complaint on file.   HPI Lori Mcmillan is a 62 y.o. female here today with complaint of rash.  She has a few spots that popped up after cleaning up a pile of leaves at her house.  Areas are located on legs.  Areas are very itchy.  She has tried benadryl cream and water mixed with baking soda.  Some improvement with this.   Denies pain, fever or chills.   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  No sign of residual dz at 04/2018 rad onc f/u and 10/2018 neuro-onc f/u. 03/2019 MRI brain->no resid/no recurrence.   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   Chicken pox    Depression    zoloft in past--?wt gain.   Diplopia    chronic (meningioma-related)   Eustachian tube dysfunction 09/05/2013   Family history of breast cancer    Family history of colon cancer    Family history of melanoma    Family history of ovarian cancer    Family history of pancreatic cancer    GERD (gastroesophageal reflux disease)    History of radiation therapy 02/24/12-04/11/12   right breast/ 45Gy@1 .8Gyx2fx/boost=16Gy@2  Gya31fx.  Latest mammo and u/s 03/2013--normal.   History of radiation therapy 11/30/17- 01/11/18   Right temporal lobe treated to 55.8 Gy with 31 fx of 1.8 Gy   Hx of basal cell carcinoma    Migraine    Palpitations    has taken Metoprolol for palpitations in the past.   Seizure (HCC)    2 YEARS AGO,UPDATED 09/07/22   Taste impairment    s/p radiation therapy   Tobacco dependence 09/05/2013    Past Surgical History:  Procedure  Laterality Date   APPENDECTOMY  2008   emergency   BRAIN TUMOR EXCISION  2018   Meningioma   BREAST LUMPECTOMY Right 01/04/2012   right breast   BREAST LUMPECTOMY WITH RADIOACTIVE SEED AND SENTINEL LYMPH NODE BIOPSY Right 06/18/2021   Procedure: RIGHT BREAST LUMPECTOMY WITH RADIOACTIVE SEED AND SENTINEL LYMPH NODE BIOPSY;  Surgeon: Harriette Bouillon, MD;  Location: [facility];  Service: General;  Laterality: Right;   COLONOSCOPY     2014 ?   CRANIECTOMY  10/19/2017   at [facility]   IR IMAGING GUIDED PORT INSERTION  02/02/2023   PORTACATH PLACEMENT Right 06/18/2021   Procedure: INSERTION PORT-A-CATH;  Surgeon: Harriette Bouillon, MD;  Location: [facility];  Service: General;  Laterality: Right;   PORTACATH REMOVED     OCTOBER 10,2023   WISDOM TOOTH EXTRACTION  1990    Social History   Socioeconomic History   Marital status: Single    Spouse name: Not on file   Number of children: Not on file   Years of education: Not on file   Highest education level: Bachelor's degree (e.g., BA, AB, BS)  Occupational History   Not on file  Tobacco Use   Smoking status: Former    Current packs/day: 0.00  dx 89s, paternal first Mcmillan   Crohn's disease Neg Hx    Esophageal cancer Neg Hx    Rectal cancer Neg Hx    Ulcerative colitis Neg Hx     Health Maintenance  Topic Date Due   COVID-19 Vaccine (3 - Pfizer risk series) 08/05/2023 (Originally 02/25/2020)   Medicare Annual Wellness (AWV)  08/18/2023 (Originally 1961-06-10)   HIV Screening  03/02/2024 (Originally 03/28/1978)   Zoster Vaccines- Shingrix (2 of 2) 09/14/2023   MAMMOGRAM  05/15/2025   Cervical Cancer Screening (HPV/Pap Cotest)  06/11/2027   DTaP/Tdap/Td (2 - Td or Tdap) 03/05/2029   Colonoscopy  09/29/2029   INFLUENZA VACCINE  Completed   Hepatitis C Screening  Completed   HPV VACCINES  Aged Out     ----------------------------------------------------------------------------------------------------------------------------------------------------------------------------------------------------------------- Physical Exam BP 128/76   Pulse 97   Ht 5\' 3"  (1.6 m)   Wt 136 lb (61.7 kg)   LMP 05/11/2013   SpO2 99%   BMI 24.09 kg/m   Physical Exam Constitutional:      Appearance: Normal appearance.  Skin:    Comments: 2 erythematous papules on each of her legs consistent with insect bite.   Neurological:     Mental Status: She is alert.     ------------------------------------------------------------------------------------------------------------------------------------------------------------------------------------------------------------------- Assessment and Plan  Insect bite Lesions consistent with insect bites.  Adding topical triamcinolone cream.  If not improving with this she will let me know.  Discussed s/s of infection.    Meds ordered this encounter  Medications   triamcinolone cream (KENALOG) 0.1 %    Sig: Apply 1 Application topically 2 (two) times  daily.    Dispense:  30 g    Refill:  0    No follow-ups on file.    This visit occurred during the SARS-CoV-2 public health emergency.  Safety protocols were in place, including screening questions prior to the visit, additional usage of staff PPE, and extensive cleaning of exam room while observing appropriate contact time as indicated for disinfecting solutions.  Lori Mcmillan - 62 y.o. female MRN [id number]  Date of birth: 1961/05/04  Subjective No chief complaint on file.   HPI Lori Mcmillan is a 62 y.o. female here today with complaint of rash.  She has a few spots that popped up after cleaning up a pile of leaves at her house.  Areas are located on legs.  Areas are very itchy.  She has tried benadryl cream and water mixed with baking soda.  Some improvement with this.   Denies pain, fever or chills.   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  No sign of residual dz at 04/2018 rad onc f/u and 10/2018 neuro-onc f/u. 03/2019 MRI brain->no resid/no recurrence.   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   Chicken pox    Depression    zoloft in past--?wt gain.   Diplopia    chronic (meningioma-related)   Eustachian tube dysfunction 09/05/2013   Family history of breast cancer    Family history of colon cancer    Family history of melanoma    Family history of ovarian cancer    Family history of pancreatic cancer    GERD (gastroesophageal reflux disease)    History of radiation therapy 02/24/12-04/11/12   right breast/ 45Gy@1 .8Gyx2fx/boost=16Gy@2  Gya31fx.  Latest mammo and u/s 03/2013--normal.   History of radiation therapy 11/30/17- 01/11/18   Right temporal lobe treated to 55.8 Gy with 31 fx of 1.8 Gy   Hx of basal cell carcinoma    Migraine    Palpitations    has taken Metoprolol for palpitations in the past.   Seizure (HCC)    2 YEARS AGO,UPDATED 09/07/22   Taste impairment    s/p radiation therapy   Tobacco dependence 09/05/2013    Past Surgical History:  Procedure  Laterality Date   APPENDECTOMY  2008   emergency   BRAIN TUMOR EXCISION  2018   Meningioma   BREAST LUMPECTOMY Right 01/04/2012   right breast   BREAST LUMPECTOMY WITH RADIOACTIVE SEED AND SENTINEL LYMPH NODE BIOPSY Right 06/18/2021   Procedure: RIGHT BREAST LUMPECTOMY WITH RADIOACTIVE SEED AND SENTINEL LYMPH NODE BIOPSY;  Surgeon: Harriette Bouillon, MD;  Location: [facility];  Service: General;  Laterality: Right;   COLONOSCOPY     2014 ?   CRANIECTOMY  10/19/2017   at [facility]   IR IMAGING GUIDED PORT INSERTION  02/02/2023   PORTACATH PLACEMENT Right 06/18/2021   Procedure: INSERTION PORT-A-CATH;  Surgeon: Harriette Bouillon, MD;  Location: [facility];  Service: General;  Laterality: Right;   PORTACATH REMOVED     OCTOBER 10,2023   WISDOM TOOTH EXTRACTION  1990    Social History   Socioeconomic History   Marital status: Single    Spouse name: Not on file   Number of children: Not on file   Years of education: Not on file   Highest education level: Bachelor's degree (e.g., BA, AB, BS)  Occupational History   Not on file  Tobacco Use   Smoking status: Former    Current packs/day: 0.00

## 2023-07-20 NOTE — Patient Instructions (Signed)
Try triamcinolone cream twice per day.  Let me know if not improving over the next week.

## 2023-07-25 DIAGNOSIS — L209 Atopic dermatitis, unspecified: Secondary | ICD-10-CM | POA: Diagnosis not present

## 2023-07-25 DIAGNOSIS — D485 Neoplasm of uncertain behavior of skin: Secondary | ICD-10-CM | POA: Diagnosis not present

## 2023-07-25 DIAGNOSIS — L309 Dermatitis, unspecified: Secondary | ICD-10-CM | POA: Diagnosis not present

## 2023-08-01 MED FILL — Dexamethasone Sodium Phosphate Inj 100 MG/10ML: INTRAMUSCULAR | Qty: 1 | Status: AC

## 2023-08-01 MED FILL — Fosaprepitant Dimeglumine For IV Infusion 150 MG (Base Eq): INTRAVENOUS | Qty: 5 | Status: AC

## 2023-08-02 ENCOUNTER — Ambulatory Visit: Payer: 59

## 2023-08-02 ENCOUNTER — Inpatient Hospital Stay: Payer: 59

## 2023-08-02 ENCOUNTER — Ambulatory Visit: Payer: 59 | Admitting: Adult Health

## 2023-08-02 ENCOUNTER — Inpatient Hospital Stay: Payer: 59 | Attending: Hematology and Oncology

## 2023-08-02 VITALS — BP 127/77 | HR 78 | Temp 97.8°F | Resp 18 | Wt 136.8 lb

## 2023-08-02 DIAGNOSIS — Z171 Estrogen receptor negative status [ER-]: Secondary | ICD-10-CM

## 2023-08-02 DIAGNOSIS — Z923 Personal history of irradiation: Secondary | ICD-10-CM | POA: Diagnosis not present

## 2023-08-02 DIAGNOSIS — C50111 Malignant neoplasm of central portion of right female breast: Secondary | ICD-10-CM | POA: Insufficient documentation

## 2023-08-02 DIAGNOSIS — C787 Secondary malignant neoplasm of liver and intrahepatic bile duct: Secondary | ICD-10-CM | POA: Insufficient documentation

## 2023-08-02 DIAGNOSIS — Z9221 Personal history of antineoplastic chemotherapy: Secondary | ICD-10-CM | POA: Diagnosis not present

## 2023-08-02 DIAGNOSIS — Z95828 Presence of other vascular implants and grafts: Secondary | ICD-10-CM

## 2023-08-02 DIAGNOSIS — Z5112 Encounter for antineoplastic immunotherapy: Secondary | ICD-10-CM | POA: Insufficient documentation

## 2023-08-02 LAB — CBC WITH DIFFERENTIAL (CANCER CENTER ONLY)
Abs Immature Granulocytes: 0 10*3/uL (ref 0.00–0.07)
Basophils Absolute: 0.1 10*3/uL (ref 0.0–0.1)
Basophils Relative: 1 %
Eosinophils Absolute: 0.2 10*3/uL (ref 0.0–0.5)
Eosinophils Relative: 4 %
HCT: 35.7 % — ABNORMAL LOW (ref 36.0–46.0)
Hemoglobin: 11.6 g/dL — ABNORMAL LOW (ref 12.0–15.0)
Immature Granulocytes: 0 %
Lymphocytes Relative: 32 %
Lymphs Abs: 1.5 10*3/uL (ref 0.7–4.0)
MCH: 31.3 pg (ref 26.0–34.0)
MCHC: 32.5 g/dL (ref 30.0–36.0)
MCV: 96.2 fL (ref 80.0–100.0)
Monocytes Absolute: 0.6 10*3/uL (ref 0.1–1.0)
Monocytes Relative: 13 %
Neutro Abs: 2.3 10*3/uL (ref 1.7–7.7)
Neutrophils Relative %: 50 %
Platelet Count: 214 10*3/uL (ref 150–400)
RBC: 3.71 MIL/uL — ABNORMAL LOW (ref 3.87–5.11)
RDW: 14.6 % (ref 11.5–15.5)
WBC Count: 4.6 10*3/uL (ref 4.0–10.5)
nRBC: 0 % (ref 0.0–0.2)

## 2023-08-02 LAB — CMP (CANCER CENTER ONLY)
ALT: 29 U/L (ref 0–44)
AST: 33 U/L (ref 15–41)
Albumin: 3.9 g/dL (ref 3.5–5.0)
Alkaline Phosphatase: 108 U/L (ref 38–126)
Anion gap: 6 (ref 5–15)
BUN: 10 mg/dL (ref 6–20)
CO2: 25 mmol/L (ref 22–32)
Calcium: 9.1 mg/dL (ref 8.9–10.3)
Chloride: 108 mmol/L (ref 98–111)
Creatinine: 0.68 mg/dL (ref 0.44–1.00)
GFR, Estimated: >60 mL/min (ref >60)
Glucose, Bld: 68 mg/dL — ABNORMAL LOW (ref 70–99)
Potassium: 4 mmol/L (ref 3.5–5.1)
Sodium: 139 mmol/L (ref 135–145)
Total Bilirubin: 0.3 mg/dL (ref 0.3–1.2)
Total Protein: 6.6 g/dL (ref 6.5–8.1)

## 2023-08-02 MED ORDER — SODIUM CHLORIDE 0.9 % IV SOLN
150.0000 mg | Freq: Once | INTRAVENOUS | Status: AC
  Administered 2023-08-02: 150 mg via INTRAVENOUS
  Filled 2023-08-02: qty 150

## 2023-08-02 MED ORDER — PALONOSETRON HCL INJECTION 0.25 MG/5ML
0.2500 mg | Freq: Once | INTRAVENOUS | Status: AC
  Administered 2023-08-02: 0.25 mg via INTRAVENOUS
  Filled 2023-08-02: qty 5

## 2023-08-02 MED ORDER — HEPARIN SOD (PORK) LOCK FLUSH 100 UNIT/ML IV SOLN
500.0000 [IU] | Freq: Once | INTRAVENOUS | Status: AC | PRN
  Administered 2023-08-02: 500 [IU]

## 2023-08-02 MED ORDER — ACETAMINOPHEN 325 MG PO TABS
650.0000 mg | ORAL_TABLET | Freq: Once | ORAL | Status: AC
  Administered 2023-08-02: 650 mg via ORAL
  Filled 2023-08-02: qty 2

## 2023-08-02 MED ORDER — SODIUM CHLORIDE 0.9% FLUSH
10.0000 mL | Freq: Once | INTRAVENOUS | Status: AC
  Administered 2023-08-02: 10 mL

## 2023-08-02 MED ORDER — FAM-TRASTUZUMAB DERUXTECAN-NXKI CHEMO 100 MG IV SOLR
5.4000 mg/kg | Freq: Once | INTRAVENOUS | Status: AC
  Administered 2023-08-02: 340 mg via INTRAVENOUS
  Filled 2023-08-02: qty 17

## 2023-08-02 MED ORDER — DEXTROSE 5 % IV SOLN: Freq: Once | INTRAVENOUS | Status: AC

## 2023-08-02 MED ORDER — SODIUM CHLORIDE 0.9 % IV SOLN
10.0000 mg | Freq: Once | INTRAVENOUS | Status: AC
  Administered 2023-08-02: 10 mg via INTRAVENOUS
  Filled 2023-08-02: qty 10

## 2023-08-02 MED ORDER — DIPHENHYDRAMINE HCL 25 MG PO CAPS
25.0000 mg | ORAL_CAPSULE | Freq: Once | ORAL | Status: AC
  Administered 2023-08-02: 25 mg via ORAL
  Filled 2023-08-02: qty 1

## 2023-08-02 MED ORDER — SODIUM CHLORIDE 0.9% FLUSH
10.0000 mL | INTRAVENOUS | Status: DC | PRN
  Administered 2023-08-02: 10 mL

## 2023-08-02 NOTE — Patient Instructions (Signed)
[place] [facility]  Discharge Instructions: Thank you for choosing [facility] to provide your oncology and hematology care.   If you have a lab appointment with the Cancer Center, please go directly to the Cancer Center and check in at the registration area.   Wear comfortable clothing and clothing appropriate for easy access to any Portacath or PICC line.   We strive to give you quality time with your provider. You may need to reschedule your appointment if you arrive late (15 or more minutes).  Arriving late affects you and other patients whose appointments are after yours.  Also, if you miss three or more appointments without notifying the office, you may be dismissed from the clinic at the provider's discretion.      For prescription refill requests, have your pharmacy contact our office and allow 72 hours for refills to be completed.    Today you received the following chemotherapy and/or immunotherapy agents: Enhertu.      To help prevent nausea and vomiting after your treatment, we encourage you to take your nausea medication as directed.  BELOW ARE SYMPTOMS THAT SHOULD BE REPORTED IMMEDIATELY: *FEVER GREATER THAN 100.4 F (38 C) OR HIGHER *CHILLS OR SWEATING *NAUSEA AND VOMITING THAT IS NOT CONTROLLED WITH YOUR NAUSEA MEDICATION *UNUSUAL SHORTNESS OF BREATH *UNUSUAL BRUISING OR BLEEDING *URINARY PROBLEMS (pain or burning when urinating, or frequent urination) *BOWEL PROBLEMS (unusual diarrhea, constipation, pain near the anus) TENDERNESS IN MOUTH AND THROAT WITH OR WITHOUT PRESENCE OF ULCERS (sore throat, sores in mouth, or a toothache) UNUSUAL RASH, SWELLING OR PAIN  UNUSUAL VAGINAL DISCHARGE OR ITCHING   Items with * indicate a potential emergency and should be followed up as soon as possible or go to the Emergency Department if any problems should occur.  Please show the CHEMOTHERAPY ALERT CARD or IMMUNOTHERAPY ALERT CARD at  check-in to the Emergency Department and triage nurse.  Should you have questions after your visit or need to cancel or reschedule your appointment, please contact [place] [facility]  Dept: [phone]  and follow the prompts.  Office hours are 8:00 a.m. to 4:30 p.m. Monday - Friday. Please note that voicemails left after 4:00 p.m. may not be returned until the following business day.  We are closed weekends and major holidays. You have access to a nurse at all times for urgent questions. Please call the main number to the clinic Dept: [phone] and follow the prompts.   For any non-urgent questions, you may also contact your provider using MyChart. We now offer e-Visits for anyone 50 and older to request care online for non-urgent symptoms. For details visit [email or web address].   Also download the MyChart app! Go to the app store, search "MyChart", open the app, select [facility], and log in with your MyChart username and password.

## 2023-08-02 NOTE — Progress Notes (Signed)
Per Mardella Layman NP, ok to treat today with ECHO from 04/25/23, EF 55-60%.

## 2023-08-08 DIAGNOSIS — L209 Atopic dermatitis, unspecified: Secondary | ICD-10-CM | POA: Diagnosis not present

## 2023-08-22 MED FILL — Fosaprepitant Dimeglumine For IV Infusion 150 MG (Base Eq): INTRAVENOUS | Qty: 5 | Status: AC

## 2023-08-23 ENCOUNTER — Inpatient Hospital Stay: Payer: 59

## 2023-08-23 ENCOUNTER — Encounter: Payer: Self-pay | Admitting: Adult Health

## 2023-08-23 ENCOUNTER — Inpatient Hospital Stay: Payer: 59 | Attending: Hematology and Oncology

## 2023-08-23 ENCOUNTER — Inpatient Hospital Stay (HOSPITAL_BASED_OUTPATIENT_CLINIC_OR_DEPARTMENT_OTHER): Payer: 59 | Admitting: Adult Health

## 2023-08-23 DIAGNOSIS — Z5112 Encounter for antineoplastic immunotherapy: Secondary | ICD-10-CM | POA: Insufficient documentation

## 2023-08-23 DIAGNOSIS — Z1731 Human epidermal growth factor receptor 2 positive status: Secondary | ICD-10-CM | POA: Insufficient documentation

## 2023-08-23 DIAGNOSIS — Z923 Personal history of irradiation: Secondary | ICD-10-CM | POA: Insufficient documentation

## 2023-08-23 DIAGNOSIS — C787 Secondary malignant neoplasm of liver and intrahepatic bile duct: Secondary | ICD-10-CM | POA: Diagnosis not present

## 2023-08-23 DIAGNOSIS — Z9221 Personal history of antineoplastic chemotherapy: Secondary | ICD-10-CM | POA: Diagnosis not present

## 2023-08-23 DIAGNOSIS — Z171 Estrogen receptor negative status [ER-]: Secondary | ICD-10-CM | POA: Insufficient documentation

## 2023-08-23 DIAGNOSIS — C50111 Malignant neoplasm of central portion of right female breast: Secondary | ICD-10-CM | POA: Diagnosis not present

## 2023-08-23 DIAGNOSIS — Z95828 Presence of other vascular implants and grafts: Secondary | ICD-10-CM

## 2023-08-23 LAB — CMP (CANCER CENTER ONLY)
ALT: 33 U/L (ref 0–44)
AST: 34 U/L (ref 15–41)
Albumin: 3.7 g/dL (ref 3.5–5.0)
Alkaline Phosphatase: 118 U/L (ref 38–126)
Anion gap: 5 (ref 5–15)
BUN: 8 mg/dL (ref 6–20)
CO2: 26 mmol/L (ref 22–32)
Calcium: 8.7 mg/dL — ABNORMAL LOW (ref 8.9–10.3)
Chloride: 109 mmol/L (ref 98–111)
Creatinine: 0.58 mg/dL (ref 0.44–1.00)
GFR, Estimated: >60 mL/min (ref >60)
Glucose, Bld: 92 mg/dL (ref 70–99)
Potassium: 3.6 mmol/L (ref 3.5–5.1)
Sodium: 140 mmol/L (ref 135–145)
Total Bilirubin: 0.4 mg/dL (ref <1.2)
Total Protein: 6.2 g/dL — ABNORMAL LOW (ref 6.5–8.1)

## 2023-08-23 LAB — CBC WITH DIFFERENTIAL (CANCER CENTER ONLY)
Abs Immature Granulocytes: 0.01 10*3/uL (ref 0.00–0.07)
Basophils Absolute: 0 10*3/uL (ref 0.0–0.1)
Basophils Relative: 1 %
Eosinophils Absolute: 0.1 10*3/uL (ref 0.0–0.5)
Eosinophils Relative: 3 %
HCT: 34 % — ABNORMAL LOW (ref 36.0–46.0)
Hemoglobin: 11 g/dL — ABNORMAL LOW (ref 12.0–15.0)
Immature Granulocytes: 0 %
Lymphocytes Relative: 31 %
Lymphs Abs: 1.2 10*3/uL (ref 0.7–4.0)
MCH: 31 pg (ref 26.0–34.0)
MCHC: 32.4 g/dL (ref 30.0–36.0)
MCV: 95.8 fL (ref 80.0–100.0)
Monocytes Absolute: 0.5 10*3/uL (ref 0.1–1.0)
Monocytes Relative: 12 %
Neutro Abs: 2.2 10*3/uL (ref 1.7–7.7)
Neutrophils Relative %: 53 %
Platelet Count: 170 10*3/uL (ref 150–400)
RBC: 3.55 MIL/uL — ABNORMAL LOW (ref 3.87–5.11)
RDW: 14.9 % (ref 11.5–15.5)
WBC Count: 4.1 10*3/uL (ref 4.0–10.5)
nRBC: 0 % (ref 0.0–0.2)

## 2023-08-23 MED ORDER — FAM-TRASTUZUMAB DERUXTECAN-NXKI CHEMO 100 MG IV SOLR
5.4000 mg/kg | Freq: Once | INTRAVENOUS | Status: AC
  Administered 2023-08-23: 340 mg via INTRAVENOUS
  Filled 2023-08-23: qty 17

## 2023-08-23 MED ORDER — SODIUM CHLORIDE 0.9 % IV SOLN
150.0000 mg | Freq: Once | INTRAVENOUS | Status: AC
  Administered 2023-08-23: 150 mg via INTRAVENOUS
  Filled 2023-08-23: qty 150

## 2023-08-23 MED ORDER — DEXTROSE 5 % IV SOLN: Freq: Once | INTRAVENOUS | Status: AC

## 2023-08-23 MED ORDER — SODIUM CHLORIDE 0.9% FLUSH
10.0000 mL | Freq: Once | INTRAVENOUS | Status: AC
  Administered 2023-08-23: 10 mL

## 2023-08-23 MED ORDER — HEPARIN SOD (PORK) LOCK FLUSH 100 UNIT/ML IV SOLN
500.0000 [IU] | Freq: Once | INTRAVENOUS | Status: AC | PRN
Start: 2023-08-23 — End: 2023-08-23
  Administered 2023-08-23: 500 [IU]

## 2023-08-23 MED ORDER — ACETAMINOPHEN 325 MG PO TABS
650.0000 mg | ORAL_TABLET | Freq: Once | ORAL | Status: AC
  Administered 2023-08-23: 650 mg via ORAL
  Filled 2023-08-23: qty 2

## 2023-08-23 MED ORDER — SODIUM CHLORIDE 0.9% FLUSH
10.0000 mL | INTRAVENOUS | Status: DC | PRN
Start: 2023-08-23 — End: 2023-08-23
  Administered 2023-08-23: 10 mL

## 2023-08-23 MED ORDER — PALONOSETRON HCL INJECTION 0.25 MG/5ML
0.2500 mg | Freq: Once | INTRAVENOUS | Status: AC
Start: 2023-08-23 — End: 2023-08-23
  Administered 2023-08-23: 0.25 mg via INTRAVENOUS
  Filled 2023-08-23: qty 5

## 2023-08-23 MED ORDER — DIPHENHYDRAMINE HCL 25 MG PO CAPS
25.0000 mg | ORAL_CAPSULE | Freq: Once | ORAL | Status: AC
Start: 2023-08-23 — End: 2023-08-23
  Administered 2023-08-23: 25 mg via ORAL
  Filled 2023-08-23: qty 1

## 2023-08-23 MED ORDER — DEXAMETHASONE SODIUM PHOSPHATE 10 MG/ML IJ SOLN
10.0000 mg | Freq: Once | INTRAMUSCULAR | Status: AC
  Administered 2023-08-23: 10 mg via INTRAVENOUS
  Filled 2023-08-23: qty 1

## 2023-08-23 NOTE — Patient Instructions (Signed)
[place] [facility] - A DEPT OF [facility]  Discharge Instructions: Thank you for choosing [facility] to provide your oncology and hematology care.   If you have a lab appointment with the Cancer Center, please go directly to the Cancer Center and check in at the registration area.   Wear comfortable clothing and clothing appropriate for easy access to any Portacath or PICC line.   We strive to give you quality time with your provider. You may need to reschedule your appointment if you arrive late (15 or more minutes).  Arriving late affects you and other patients whose appointments are after yours.  Also, if you miss three or more appointments without notifying the office, you may be dismissed from the clinic at the provider's discretion.      For prescription refill requests, have your pharmacy contact our office and allow 72 hours for refills to be completed.    Today you received the following chemotherapy and/or immunotherapy agents Enhertu      To help prevent nausea and vomiting after your treatment, we encourage you to take your nausea medication as directed.  BELOW ARE SYMPTOMS THAT SHOULD BE REPORTED IMMEDIATELY: *FEVER GREATER THAN 100.4 F (38 C) OR HIGHER *CHILLS OR SWEATING *NAUSEA AND VOMITING THAT IS NOT CONTROLLED WITH YOUR NAUSEA MEDICATION *UNUSUAL SHORTNESS OF BREATH *UNUSUAL BRUISING OR BLEEDING *URINARY PROBLEMS (pain or burning when urinating, or frequent urination) *BOWEL PROBLEMS (unusual diarrhea, constipation, pain near the anus) TENDERNESS IN MOUTH AND THROAT WITH OR WITHOUT PRESENCE OF ULCERS (sore throat, sores in mouth, or a toothache) UNUSUAL RASH, SWELLING OR PAIN  UNUSUAL VAGINAL DISCHARGE OR ITCHING   Items with * indicate a potential emergency and should be followed up as soon as possible or go to the Emergency Department if any problems should occur.  Please show the CHEMOTHERAPY ALERT CARD or IMMUNOTHERAPY  ALERT CARD at check-in to the Emergency Department and triage nurse.  Should you have questions after your visit or need to cancel or reschedule your appointment, please contact [place] [facility] - A DEPT OF [facility]  Dept: [phone]  and follow the prompts.  Office hours are 8:00 a.m. to 4:30 p.m. Monday - Friday. Please note that voicemails left after 4:00 p.m. may not be returned until the following business day.  We are closed weekends and major holidays. You have access to a nurse at all times for urgent questions. Please call the main number to the clinic Dept: [phone] and follow the prompts.   For any non-urgent questions, you may also contact your provider using MyChart. We now offer e-Visits for anyone 17 and older to request care online for non-urgent symptoms. For details visit [email or web address].   Also download the MyChart app! Go to the app store, search "MyChart", open the app, select [place], and log in with your MyChart username and password.

## 2023-08-23 NOTE — Progress Notes (Signed)
[facility] Cancer Follow up:    Lori Coombe, DO [address]  [address] [place] [address]   DIAGNOSIS:  Cancer Staging  Malignant neoplasm of central portion of right breast in female, estrogen receptor negative (HCC) Staging form: Breast, AJCC 6th Edition - Pathologic stage from 01/04/2012: Stage I (T1c, N0, M0) - Signed by [name], NP on 10/05/2022 Histologic grade (G): G1 Staging form: Breast, AJCC 8th Edition - Pathologic stage from 06/18/2021: Stage IA (pT1c, pN0, cM0, G3, ER-, PR-, HER2+) - Signed by [name], NP on 10/05/2022 Stage prefix: Initial diagnosis Histologic grading system: 3 grade system - Pathologic stage from 01/13/2023: Stage IV (pM1, G3, ER-, PR-, HER2+) - Signed by [name], NP on 02/07/2023 Histologic grading system: 3 grade system   SUMMARY OF ONCOLOGIC HISTORY: Oncology History  Malignant neoplasm of central portion of right breast in female, estrogen receptor negative (HCC)  01/04/2012 Initial Diagnosis   Right breast cancer: Stage Ia ER/PR positive HER2 negative status postlumpectomy, radiation (low risk Oncotype) could not tolerate tamoxifen for more than 30 days.   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  Biopsy revealed grade 3 IDC ER/PR negative HER2 positive with a Ki-67 of 30%   06/18/2021 Surgery   Right lumpectomy: Grade 3 IDC 1.5 cm, high-grade DCIS, margins negative, 0/2 lymph nodes negative, ER 0%, PR 0%, HER2 3+ positive, Ki-67 30%    Genetic  Testing   Negative genetic testing. No pathogenic variants identified on the [facility] Multi-Cancer Panel+RNA. VUS in POLD1 identified called c.2429C>T. The report date is 07/01/2021.  The Multi-Cancer Panel + RNA offered by [facility] includes sequencing and/or deletion duplication testing of the following 84 genes: AIP, ALK, APC, ATM, AXIN2,BAP1,  BARD1, BLM, BMPR1A, BRCA1, BRCA2, BRIP1, CASR, CDC73, CDH1, CDK4, CDKN1B, CDKN1C, CDKN2A (p14ARF), CDKN2A (p16INK4a), CEBPA, CHEK2, CTNNA1, DICER1, DIS3L2, EGFR (c.2369C>T, p.Thr790Met variant only), EPCAM (Deletion/duplication testing only), FH, FLCN, GATA2, GPC3, GREM1 (Promoter region deletion/duplication testing only), HOXB13 (c.251G>A, p.Gly84Glu), HRAS, KIT, MAX, MEN1, MET, MITF (c.952G>A, p.Glu318Lys variant only), MLH1, MSH2, MSH3, MSH6, MUTYH, NBN, NF1, NF2, NTHL1, PALB2, PDGFRA, PHOX2B, PMS2, POLD1, POLE, POT1, PRKAR1A, PTCH1, PTEN, RAD50, RAD51C, RAD51D, RB1, RECQL4, RET, RUNX1, SDHAF2, SDHA (sequence changes only), SDHB, SDHC, SDHD, SMAD4, SMARCA4, SMARCB1, SMARCE1, STK11, SUFU, TERC, TERT, TMEM127, TP53, TSC1, TSC2, VHL, WRN and WT1.   06/18/2021 Cancer Staging   Staging form: Breast, AJCC 8th Edition - Pathologic stage from 06/18/2021: Stage IA (pT1c, pN0, cM0, G3, ER-, PR-, HER2+) - Signed by [name], NP on 10/05/2022 Stage prefix: Initial diagnosis Histologic grading system: 3 grade system   07/21/2021 - 07/06/2022 Chemotherapy   Patient is on Treatment Plan : BREAST Paclitaxel + Trastuzumab q7d / Trastuzumab q21d     11/25/2021 - 01/04/2022 Radiation Therapy   Site Technique Total Dose (Gy) Dose per Fx (Gy) Completed Fx Beam Energies  Breast, Right: Breast_R 3D 45/45 1.8 25/25 6XFFF  Breast, Right: Breast_R_Bst specialPort 5.4/5.4 1.8 3/3 12E     12/29/2022 Progression  CT chest on 11/01/2022: Right upper lobe nodules largest measuring 5 mm CT chest 12/29/2022: Upper and mid lung zone predominant pulmonary nodules are worrisome for  metastatic disease.  Left and right hepatic lobe masses (2.5 cm and 3.9 cm) are new and are worrisome for metastatic disease.   01/13/2023 Initial Biopsy   Right liver biopsy: Metastatic carcinoma with breast primary, ER-, PR-, HER2 +, Ki-67 50%.    01/13/2023 Cancer Staging   Staging form: Breast, AJCC 8th Edition - Pathologic stage from 01/13/2023: Stage IV (pM1, G3, ER-, PR-, HER2+) - Signed by [name], NP on 02/07/2023 Histologic grading system: 3 grade system   01/17/2023 PET scan   PET CT scan 01/17/2023: Hypermetabolic bilobar hepatic (2 masses measuring 2.8 cm SUV 11.1, another mass 4.1 cm SUV 10.3) and pulmonary metastases (right upper lobe lung nodule 5 mm SUV 2.6 right lower lobe 6 mm nodule SUV 1.8)    02/07/2023 -  Chemotherapy   Patient is on Treatment Plan : BREAST METASTATIC Fam-Trastuzumab Deruxtecan-nxki (Enhertu) (5.4) q21d       CURRENT THERAPY: Enhertu  INTERVAL HISTORY: Lori Mcmillan 62 y.o. female returns for f/u of her metastatic breast cancer receiving Enhertu every three months.  She reports a recent episode of a severe skin reaction. The reaction, initially suspected to be a bug bite, presented as blisters on the lower leg, knees, and back of the calf. The condition began on September 16th and has since resolved. The patient sought dermatological care, which included a punch biopsy. The biopsy results suggested the reaction could be due to a bug bite or drug-induced eczema. The patient managed the condition with clobetasol 0.05%.  The patient also reports stable side effects from her ongoing treatment, with no intensification or decrease in symptoms. She has experienced one episode of nausea, which was managed with Compazine. The patient denies any new pain and reports maintaining a stable weight. She also reports good physical activity, including dragging two industrial-sized waste cans for three-tenths of a mile.  The patient's recent scans (06/2023)  and labs were reported as good, and she is scheduled for an echocardiogram and a PET scan in the near future. The patient has not reported any new medications or changes in her current regimen.   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) 02/07/2023   Cancer with pulmonary metastases (HCC) 02/07/2023   Cellulitis of finger of right hand 05/12/2022   Eustachian tube dysfunction, right 08/09/2021   Port-A-Cath in place 07/21/2021   Genetic testing 07/02/2021   Hx of basal cell carcinoma 05/26/2021   Family history of breast cancer 05/26/2021   Family history of ovarian cancer 05/26/2021   Family history of pancreatic cancer 05/26/2021   Family history of colon cancer 05/26/2021   Family history of melanoma 05/26/2021   Breast lump in upper inner quadrant 04/30/2021   Post herpetic neuralgia 01/29/2021   Seizures (HCC) 01/01/2021   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  No sign of residual dz at 04/2018 rad onc f/u and 10/2018 neuro-onc f/u. 03/2019 MRI brain->no resid/no recurrence.   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   Chicken pox    Depression    zoloft in past--?wt gain.   Diplopia    chronic (meningioma-related)   Eustachian tube dysfunction 09/05/2013   Family history of breast cancer    Family history of colon cancer    Family history of melanoma    Family  history of ovarian cancer    Family history of pancreatic cancer    GERD (gastroesophageal reflux disease)    History of radiation therapy 02/24/12-04/11/12   right breast/ 45Gy@1 .8Gyx83fx/boost=16Gy@2  Gya5fx.  Latest mammo and u/s 03/2013--normal.   History of radiation therapy 11/30/17- 01/11/18   Right temporal lobe treated to 55.8 Gy with 31 fx of 1.8 Gy   Hx of basal cell carcinoma    Migraine    Palpitations    has taken Metoprolol for palpitations in the past.   Seizure (HCC)    2 YEARS AGO,UPDATED 09/07/22   Taste impairment    s/p radiation therapy   Tobacco dependence 09/05/2013    SURGICAL HISTORY: Past Surgical History:  Procedure Laterality Date   APPENDECTOMY  2008   emergency   BRAIN TUMOR EXCISION  2018   Meningioma   BREAST LUMPECTOMY Right 01/04/2012   right breast   BREAST LUMPECTOMY WITH RADIOACTIVE SEED AND SENTINEL LYMPH NODE BIOPSY Right 06/18/2021   Procedure: RIGHT BREAST LUMPECTOMY WITH RADIOACTIVE SEED AND SENTINEL LYMPH NODE BIOPSY;  Surgeon: Harriette Bouillon, MD;  Location: [facility];  Service: General;  Laterality: Right;   COLONOSCOPY     2014 ?   CRANIECTOMY  10/19/2017   at [facility]   IR IMAGING GUIDED PORT INSERTION  02/02/2023   PORTACATH PLACEMENT Right 06/18/2021   Procedure: INSERTION PORT-A-CATH;  Surgeon: Harriette Bouillon, MD;  Location:  SURGERY CENTER;  Service: General;  Laterality: Right;   PORTACATH REMOVED     OCTOBER 10,2023   WISDOM TOOTH EXTRACTION  1990    SOCIAL HISTORY: Social History   Socioeconomic History   Marital status: Single    Spouse name: Not on file   Number of children: Not on file   Years of education: Not on file   Highest education level: Bachelor's degree (e.g., BA, AB, BS)  Occupational History   Not on file  Tobacco Use   Smoking status: Former    Current packs/day: 0.00    Types: Cigarettes    Quit date: 10/17/2017    Years since quitting: 5.8    Passive exposure:  Never   Smokeless tobacco: Never  Vaping Use   Vaping status: Former  Substance and Sexual Activity   Alcohol use: Yes    Alcohol/week: 3.0 standard drinks of alcohol    Types: 3 Cans of beer per week    Comment: 3-4 beers/day   Drug use: Not Currently   Sexual activity: Not Currently    Partners: Male  Other Topics Concern   Not on file  Social History Narrative   Marital status/children/pets: Single.  No children.  Lives alone.  Has pets.Orig from [place] in [place].   Education/employment: Bachelor's degree, retired   Field seismologist:      -smoke alarm in the home:Yes     -  wears seatbelt: Yes               Social Determinants of Health   Financial Resource Strain: Medium Risk (05/23/2023)   Overall Financial Resource Strain (CARDIA)    Difficulty of Paying Living Expenses: Somewhat hard  Food Insecurity: Patient Declined (05/23/2023)   Hunger Vital Sign    Worried About Running Out of Food in the Last Year: Patient declined    Ran Out of Food in the Last Year: Patient declined  Transportation Needs: No Transportation Needs (05/23/2023)   PRAPARE - Administrator, Civil Service (Medical): No    Lack of Transportation (Non-Medical): No  Physical Activity: Insufficiently Active (05/23/2023)   Exercise Vital Sign    Days of Exercise per Week: 6 days    Minutes of Exercise per Session: 20 min  Stress: Stress Concern Present (05/23/2023)   [employer] of Occupational Health - Occupational Stress Questionnaire    Feeling of Stress : To some extent  Social Connections: Unknown (05/23/2023)   Social Connection and Isolation Panel [NHANES]    Frequency of Communication with Friends and Family: More than three times a week    Frequency of Social Gatherings with Friends and Family: Patient declined    Attends Religious Services: Patient declined    Database administrator or Organizations: No    Attends Engineer, structural: Not on file    Marital Status: Patient  declined  Intimate Partner Violence: Unknown (01/20/2022)   Received from [employer], [facility]   HITS    Physically Hurt: Not on file    Insult or Talk Down To: Not on file    Threaten Physical Harm: Not on file    Scream or Curse: Not on file    FAMILY HISTORY: Family History  Problem Relation Age of Onset   Anesthesia problems Mother    Breast cancer Mother 67       lumpectomy, chemo, radiation   Colon polyps Brother    Ovarian cancer Maternal Aunt 74   Cancer Maternal Aunt        unknown type   Breast cancer Maternal Aunt 80   Breast cancer Maternal Aunt 40       bilateral   Pancreatic cancer Maternal Uncle        dx 91s   Stomach cancer Paternal Uncle    Cancer Paternal Uncle        unknown type   Lung cancer Paternal Uncle        hx smoking   Dementia Maternal Grandmother    Lung cancer Maternal Grandfather        hx smoking   Pancreatic cancer Paternal Grandmother 75   Melanoma Paternal Grandmother        dx >50, shin   Heart Problems Paternal Grandfather    Colon cancer Cousin 85       maternal first cousin   Breast cancer Cousin        dx 50s, paternal first cousin   Crohn's disease Neg Hx    Esophageal cancer Neg Hx    Rectal cancer Neg Hx    Ulcerative colitis Neg Hx     Review of Systems  Constitutional:  Negative for appetite change, chills, fatigue, fever and unexpected weight change.  HENT:   Negative for hearing loss, lump/mass and trouble swallowing.   Eyes:  Negative for eye problems and icterus.  Respiratory:  Negative for chest tightness, cough and shortness of  breath.   Cardiovascular:  Negative for chest pain, leg swelling and palpitations.  Gastrointestinal:  Negative for abdominal distention, abdominal pain, constipation, diarrhea, nausea and vomiting.  Endocrine: Negative for hot flashes.  Genitourinary:  Negative for difficulty urinating.   Musculoskeletal:  Negative for arthralgias.  Skin:  Positive for rash. Negative for  itching.  Neurological:  Negative for dizziness, extremity weakness, headaches and numbness.  Hematological:  Negative for adenopathy. Does not bruise/bleed easily.  Psychiatric/Behavioral:  Negative for depression. The patient is not nervous/anxious.       PHYSICAL EXAMINATION    Vitals:   08/23/23 0852  BP: 125/80  Pulse: 74  Resp: 18  Temp: (!) 97.4 F (36.3 C)  SpO2: 99%    Physical Exam Constitutional:      General: She is not in acute distress.    Appearance: Normal appearance. She is not toxic-appearing.  HENT:     Head: Normocephalic and atraumatic.     Mouth/Throat:     Mouth: Mucous membranes are moist.     Pharynx: Oropharynx is clear. No oropharyngeal exudate or posterior oropharyngeal erythema.  Eyes:     General: No scleral icterus. Cardiovascular:     Rate and Rhythm: Normal rate and regular rhythm.     Pulses: Normal pulses.     Heart sounds: Normal heart sounds.  Pulmonary:     Effort: Pulmonary effort is normal.     Breath sounds: Normal breath sounds.  Abdominal:     General: Abdomen is flat. Bowel sounds are normal. There is no distension.     Palpations: Abdomen is soft.     Tenderness: There is no abdominal tenderness.  Musculoskeletal:        General: No swelling.     Cervical back: Neck supple.  Lymphadenopathy:     Cervical: No cervical adenopathy.  Skin:    General: Skin is warm and dry.     Findings: No rash.  Neurological:     General: No focal deficit present.     Mental Status: She is alert.  Psychiatric:        Mood and Affect: Mood normal.        Behavior: Behavior normal.     LABORATORY DATA:  CBC    Component Value Date/Time   WBC 4.1 08/23/2023 0815   WBC 7.0 01/13/2023 1200   RBC 3.55 (L) 08/23/2023 0815   HGB 11.0 (L) 08/23/2023 0815   HGB 11.8 12/08/2011 0819   HCT 34.0 (L) 08/23/2023 0815   HCT 34.9 12/08/2011 0819   PLT 170 08/23/2023 0815   PLT 314 12/08/2011 0819   MCV 95.8 08/23/2023 0815   MCV 88.3  12/08/2011 0819   MCH 31.0 08/23/2023 0815   MCHC 32.4 08/23/2023 0815   RDW 14.9 08/23/2023 0815   RDW 14.0 12/08/2011 0819   LYMPHSABS 1.2 08/23/2023 0815   LYMPHSABS 2.2 12/08/2011 0819   MONOABS 0.5 08/23/2023 0815   MONOABS 0.8 12/08/2011 0819   EOSABS 0.1 08/23/2023 0815   EOSABS 0.2 12/08/2011 0819   BASOSABS 0.0 08/23/2023 0815   BASOSABS 0.1 12/08/2011 0819    CMP     Component Value Date/Time   NA 140 08/23/2023 0815   NA 140 11/23/2019 0000   K 3.6 08/23/2023 0815   CL 109 08/23/2023 0815   CO2 26 08/23/2023 0815   GLUCOSE 92 08/23/2023 0815   BUN 8 08/23/2023 0815   BUN 9 11/23/2019 0000   CREATININE 0.58 08/23/2023 0815  CREATININE 0.64 09/18/2013 0900   CALCIUM 8.7 (L) 08/23/2023 0815   PROT 6.2 (L) 08/23/2023 0815   ALBUMIN 3.7 08/23/2023 0815   AST 34 08/23/2023 0815   ALT 33 08/23/2023 0815   ALKPHOS 118 08/23/2023 0815   BILITOT 0.4 08/23/2023 0815   GFRNONAA >60 08/23/2023 0815   GFRAA 113 11/23/2019 0000    ASSESSMENT and THERAPY PLAN:   Malignant neoplasm of central portion of right breast in female, estrogen receptor negative (HCC) 06/18/2021:Right lumpectomy: 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  Adjuvant chemotherapy with Taxol and Herceptin followed by Herceptin maintenance completed 07/06/2022 2. breast radiation completed 01/04/2022 3.  Metastatic disease diagnosis: --------------------------------------------------------------------------------------------------------------------- CT chest on 11/01/2022: Right upper lobe nodules largest measuring 5 mm CT chest 12/29/2022: Upper and mid lung zone predominant pulmonary nodules are worrisome for metastatic disease.   Left and right hepatic lobe masses (2.5 cm and 3.9 cm) are new and are worrisome for metastatic disease. PET CT scan 01/17/2023: Hypermetabolic bilobar hepatic (2 masses measuring 2.8 cm SUV 11.1, another mass 4.1 cm SUV 10.3) and pulmonary metastases (right upper lobe lung nodule 5 mm SUV 2.6 right lower lobe 6 mm nodule SUV 1.8) 01/13/23: Liver Biopsy: Met breast cancer ER 0%, PR 0%, Ki67 50%, HER2 3+ CT CAP 04/15/2023: Response to treatment in the lung and liver metastases.  (5 mm nodule is 3 mm 6 mm nodule is 2 to 3 mm, right liver subcapsular lesion is 1.7 cm and it used to be 3.5 cm, segment 2 liver lesion 1.3 cm it used to be 2.8 cm) CT CAP 07/05/2023: Decrease in size of liver mets 0.2 cm was 1.7 cm, 1.1 cm was previously 1.3 cm.  Stable small lung nodules. Current treatment: Enhertu  Chemo toxicities: Mild nausea which resolved with Compazine. 2. Dermatological Reaction: Recent severe rash, possibly drug-induced eczema or bug bite, now resolved. Treated with clobetasol 0.05% and managed by dermatologist. -Continue current treatment as needed. -Notify oncology team if rash recurs.  Stable with good response to treatment.  No clinical signs of cancer progression.  Recent scans and labs are satisfactory. -Continue current treatment regimen. -Order echocardiogram to monitor cardiac function during cancer treatment. -OK to treat today with normal echocardiogram from 04/2023.  No signs of cardiac dysfunction or heart failure during today's visit.  -Schedule PET scan for September 27, 2023, as requested by Dr. Pamelia Hoit.   Return to clinic every 3 weeks for Enhertu.  All questions were answered. The patient knows to call the clinic with any problems, questions or concerns. We can certainly see the patient much sooner if necessary.  Total encounter time:30 minutes*in face-to-face visit time, chart review, lab review, care coordination, order entry, and documentation of the encounter time.    Lillard Anes, NP 08/23/23 9:54 AM Medical Oncology and Hematology [facility] [address] [place], [place] [address] Tel. [phone]    Fax. [phone]  *Total Encounter Time as defined by the Centers for Medicare and Medicaid Services includes, in addition to the face-to-face time of a patient visit (documented in the note above) non-face-to-face time: obtaining and reviewing outside history, ordering and reviewing medications, tests or procedures, care coordination (communications with other health  care professionals or caregivers) and documentation in the medical record.

## 2023-08-23 NOTE — Progress Notes (Signed)
Per Mardella Layman NP OK to proceed with tx today using ECHO from 04/25/2023 left EF 55-60%, new ECHO scheduled from 09/07/2023.

## 2023-08-23 NOTE — Assessment & Plan Note (Signed)
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  Adjuvant chemotherapy with Taxol and Herceptin followed by Herceptin maintenance completed 07/06/2022 2. breast radiation completed 01/04/2022 3.  Metastatic disease diagnosis: --------------------------------------------------------------------------------------------------------------------- CT chest on 11/01/2022: Right upper lobe nodules largest measuring 5 mm CT chest 12/29/2022: Upper and mid lung zone predominant pulmonary nodules are worrisome for metastatic disease.  Left and right hepatic lobe masses (2.5 cm and 3.9 cm) are new and are worrisome for metastatic disease. PET CT scan 01/17/2023: Hypermetabolic bilobar hepatic (2 masses measuring 2.8 cm SUV 11.1, another mass 4.1 cm SUV 10.3) and pulmonary metastases (right upper lobe lung nodule 5 mm SUV 2.6 right lower lobe 6 mm nodule SUV 1.8) 01/13/23: Liver Biopsy: Met breast cancer ER 0%, PR 0%, Ki67 50%, HER2 3+ CT CAP 04/15/2023: Response to treatment in the lung and liver metastases.  (5 mm nodule is 3 mm 6 mm nodule is 2 to 3 mm, right liver subcapsular lesion is 1.7 cm and it used to be 3.5 cm, segment 2 liver lesion 1.3 cm it used to be 2.8 cm) CT CAP 07/05/2023: Decrease in size of liver mets 0.2 cm was 1.7 cm, 1.1 cm was previously 1.3 cm.  Stable small lung nodules. Current treatment: Enhertu  Chemo toxicities: Mild nausea which resolved with Compazine. 2. Dermatological Reaction: Recent severe rash, possibly drug-induced eczema or bug bite, now resolved. Treated with clobetasol 0.05% and managed by dermatologist. -Continue current treatment as needed. -Notify oncology team if rash  recurs.  Stable with good response to treatment.  No clinical signs of cancer progression.  Recent scans and labs are satisfactory. -Continue current treatment regimen. -Order echocardiogram to monitor cardiac function during cancer treatment. -OK to treat today with normal echocardiogram from 04/2023.  No signs of cardiac dysfunction or heart failure during today's visit.  -Schedule PET scan for September 27, 2023, as requested by Dr. Pamelia Hoit.   Return to clinic every 3 weeks for Enhertu.

## 2023-08-24 ENCOUNTER — Telehealth: Payer: Self-pay

## 2023-08-24 NOTE — Telephone Encounter (Signed)
Called pt to make aware of PET scan appt 09/27/23 at 1230 with 1200 arrival. NPO 6 hours prior. She will f/u with MD 12/17 to review results and have tx. She verbalized agreement of above and knows to call with any questions or concerns.

## 2023-09-07 ENCOUNTER — Ambulatory Visit (HOSPITAL_COMMUNITY)
Admission: RE | Admit: 2023-09-07 | Discharge: 2023-09-07 | Disposition: A | Discharge status: Home / Self Care | Payer: 59 | Source: Ambulatory Visit | Attending: Adult Health | Admitting: Adult Health

## 2023-09-07 DIAGNOSIS — Z0189 Encounter for other specified special examinations: Secondary | ICD-10-CM | POA: Diagnosis not present

## 2023-09-07 DIAGNOSIS — C50111 Malignant neoplasm of central portion of right female breast: Secondary | ICD-10-CM | POA: Insufficient documentation

## 2023-09-07 DIAGNOSIS — Z171 Estrogen receptor negative status [ER-]: Secondary | ICD-10-CM | POA: Diagnosis not present

## 2023-09-07 DIAGNOSIS — Z01818 Encounter for other preprocedural examination: Secondary | ICD-10-CM | POA: Insufficient documentation

## 2023-09-07 LAB — ECHOCARDIOGRAM COMPLETE
Area-P 1/2: 3.27 cm2
Calc EF: 53.3 %
S' Lateral: 2.4 cm
Single Plane A2C EF: 48.3 %
Single Plane A4C EF: 57.5 %

## 2023-09-07 NOTE — Progress Notes (Signed)
  Echocardiogram 2D Echocardiogram has been performed.  Lori Mcmillan 09/07/2023, 10:58 AM

## 2023-09-12 MED FILL — Fosaprepitant Dimeglumine For IV Infusion 150 MG (Base Eq): INTRAVENOUS | Qty: 5 | Status: AC

## 2023-09-13 ENCOUNTER — Inpatient Hospital Stay: Payer: 59

## 2023-09-13 VITALS — BP 108/64 | HR 76 | Temp 98.4°F | Resp 17 | Wt 136.8 lb

## 2023-09-13 DIAGNOSIS — Z95828 Presence of other vascular implants and grafts: Secondary | ICD-10-CM

## 2023-09-13 DIAGNOSIS — Z5112 Encounter for antineoplastic immunotherapy: Secondary | ICD-10-CM | POA: Diagnosis not present

## 2023-09-13 DIAGNOSIS — C50111 Malignant neoplasm of central portion of right female breast: Secondary | ICD-10-CM | POA: Diagnosis not present

## 2023-09-13 DIAGNOSIS — Z923 Personal history of irradiation: Secondary | ICD-10-CM | POA: Diagnosis not present

## 2023-09-13 DIAGNOSIS — C787 Secondary malignant neoplasm of liver and intrahepatic bile duct: Secondary | ICD-10-CM | POA: Diagnosis not present

## 2023-09-13 DIAGNOSIS — Z171 Estrogen receptor negative status [ER-]: Secondary | ICD-10-CM | POA: Diagnosis not present

## 2023-09-13 DIAGNOSIS — Z1731 Human epidermal growth factor receptor 2 positive status: Secondary | ICD-10-CM | POA: Diagnosis not present

## 2023-09-13 DIAGNOSIS — Z9221 Personal history of antineoplastic chemotherapy: Secondary | ICD-10-CM | POA: Diagnosis not present

## 2023-09-13 LAB — CMP (CANCER CENTER ONLY)
ALT: 27 U/L (ref 0–44)
AST: 31 U/L (ref 15–41)
Albumin: 3.6 g/dL (ref 3.5–5.0)
Alkaline Phosphatase: 110 U/L (ref 38–126)
Anion gap: 4 — ABNORMAL LOW (ref 5–15)
BUN: 6 mg/dL (ref 6–20)
CO2: 26 mmol/L (ref 22–32)
Calcium: 8.7 mg/dL — ABNORMAL LOW (ref 8.9–10.3)
Chloride: 108 mmol/L (ref 98–111)
Creatinine: 0.56 mg/dL (ref 0.44–1.00)
GFR, Estimated: >60 mL/min (ref >60)
Glucose, Bld: 86 mg/dL (ref 70–99)
Potassium: 4 mmol/L (ref 3.5–5.1)
Sodium: 138 mmol/L (ref 135–145)
Total Bilirubin: 0.3 mg/dL (ref <1.2)
Total Protein: 6.2 g/dL — ABNORMAL LOW (ref 6.5–8.1)

## 2023-09-13 LAB — CBC WITH DIFFERENTIAL (CANCER CENTER ONLY)
Abs Immature Granulocytes: 0.01 10*3/uL (ref 0.00–0.07)
Basophils Absolute: 0 10*3/uL (ref 0.0–0.1)
Basophils Relative: 1 %
Eosinophils Absolute: 0.2 10*3/uL (ref 0.0–0.5)
Eosinophils Relative: 4 %
HCT: 34.7 % — ABNORMAL LOW (ref 36.0–46.0)
Hemoglobin: 11.2 g/dL — ABNORMAL LOW (ref 12.0–15.0)
Immature Granulocytes: 0 %
Lymphocytes Relative: 28 %
Lymphs Abs: 1.2 10*3/uL (ref 0.7–4.0)
MCH: 31.2 pg (ref 26.0–34.0)
MCHC: 32.3 g/dL (ref 30.0–36.0)
MCV: 96.7 fL (ref 80.0–100.0)
Monocytes Absolute: 0.5 10*3/uL (ref 0.1–1.0)
Monocytes Relative: 13 %
Neutro Abs: 2.3 10*3/uL (ref 1.7–7.7)
Neutrophils Relative %: 54 %
Platelet Count: 187 10*3/uL (ref 150–400)
RBC: 3.59 MIL/uL — ABNORMAL LOW (ref 3.87–5.11)
RDW: 14.9 % (ref 11.5–15.5)
WBC Count: 4.2 10*3/uL (ref 4.0–10.5)
nRBC: 0 % (ref 0.0–0.2)

## 2023-09-13 MED ORDER — HEPARIN SOD (PORK) LOCK FLUSH 100 UNIT/ML IV SOLN
500.0000 [IU] | Freq: Once | INTRAVENOUS | Status: AC | PRN
  Administered 2023-09-13: 500 [IU]

## 2023-09-13 MED ORDER — PALONOSETRON HCL INJECTION 0.25 MG/5ML
0.2500 mg | Freq: Once | INTRAVENOUS | Status: AC
  Administered 2023-09-13: 0.25 mg via INTRAVENOUS
  Filled 2023-09-13: qty 5

## 2023-09-13 MED ORDER — DIPHENHYDRAMINE HCL 25 MG PO CAPS
25.0000 mg | ORAL_CAPSULE | Freq: Once | ORAL | Status: AC
  Administered 2023-09-13: 25 mg via ORAL
  Filled 2023-09-13: qty 1

## 2023-09-13 MED ORDER — SODIUM CHLORIDE 0.9% FLUSH
10.0000 mL | Freq: Once | INTRAVENOUS | Status: AC
  Administered 2023-09-13: 10 mL

## 2023-09-13 MED ORDER — FAM-TRASTUZUMAB DERUXTECAN-NXKI CHEMO 100 MG IV SOLR
5.4000 mg/kg | Freq: Once | INTRAVENOUS | Status: AC
  Administered 2023-09-13: 340 mg via INTRAVENOUS
  Filled 2023-09-13: qty 17

## 2023-09-13 MED ORDER — ACETAMINOPHEN 325 MG PO TABS
650.0000 mg | ORAL_TABLET | Freq: Once | ORAL | Status: AC
  Administered 2023-09-13: 650 mg via ORAL
  Filled 2023-09-13: qty 2

## 2023-09-13 MED ORDER — SODIUM CHLORIDE 0.9 % IV SOLN
150.0000 mg | Freq: Once | INTRAVENOUS | Status: AC
  Administered 2023-09-13: 150 mg via INTRAVENOUS
  Filled 2023-09-13: qty 150

## 2023-09-13 MED ORDER — DEXAMETHASONE SODIUM PHOSPHATE 10 MG/ML IJ SOLN
10.0000 mg | Freq: Once | INTRAMUSCULAR | Status: AC
  Administered 2023-09-13: 10 mg via INTRAVENOUS
  Filled 2023-09-13: qty 1

## 2023-09-13 MED ORDER — SODIUM CHLORIDE 0.9% FLUSH
10.0000 mL | INTRAVENOUS | Status: DC | PRN
  Administered 2023-09-13: 10 mL

## 2023-09-13 MED ORDER — DEXTROSE 5 % IV SOLN: Freq: Once | INTRAVENOUS | Status: AC

## 2023-09-13 NOTE — Progress Notes (Signed)
Per Dr. Pamelia Hoit, OK to treat with Echo from 11/20 showing decrease in EF (50-55%, down from 55-60% in 7/24). Pt denies any new SOB, swelling of hands/feet, weight gain. VSS.

## 2023-09-13 NOTE — Patient Instructions (Signed)
 [facility] - A DEPT OF [facility]  Discharge Instructions: Thank you for choosing [facility] to provide your oncology and hematology care.   If you have a lab appointment with the Cancer Center, please go directly to the Cancer Center and check in at the registration area.   Wear comfortable clothing and clothing appropriate for easy access to any Portacath or PICC line.   We strive to give you quality time with your provider. You may need to reschedule your appointment if you arrive late (15 or more minutes).  Arriving late affects you and other patients whose appointments are after yours.  Also, if you miss three or more appointments without notifying the office, you may be dismissed from the clinic at the provider's discretion.      For prescription refill requests, have your pharmacy contact our office and allow 72 hours for refills to be completed.    Today you received the following chemotherapy and/or immunotherapy agents Enhertu      To help prevent nausea and vomiting after your treatment, we encourage you to take your nausea medication as directed.  BELOW ARE SYMPTOMS THAT SHOULD BE REPORTED IMMEDIATELY: *FEVER GREATER THAN 100.4 F (38 C) OR HIGHER *CHILLS OR SWEATING *NAUSEA AND VOMITING THAT IS NOT CONTROLLED WITH YOUR NAUSEA MEDICATION *UNUSUAL SHORTNESS OF BREATH *UNUSUAL BRUISING OR BLEEDING *URINARY PROBLEMS (pain or burning when urinating, or frequent urination) *BOWEL PROBLEMS (unusual diarrhea, constipation, pain near the anus) TENDERNESS IN MOUTH AND THROAT WITH OR WITHOUT PRESENCE OF ULCERS (sore throat, sores in mouth, or a toothache) UNUSUAL RASH, SWELLING OR PAIN  UNUSUAL VAGINAL DISCHARGE OR ITCHING   Items with * indicate a potential emergency and should be followed up as soon as possible or go to the Emergency Department if any problems should occur.  Please show the CHEMOTHERAPY ALERT CARD or IMMUNOTHERAPY  ALERT CARD at check-in to the Emergency Department and triage nurse.  Should you have questions after your visit or need to cancel or reschedule your appointment, please contact [facility] - A DEPT OF [facility]  Dept: [phone]  and follow the prompts.  Office hours are 8:00 a.m. to 4:30 p.m. Monday - Friday. Please note that voicemails left after 4:00 p.m. may not be returned until the following business day.  We are closed weekends and major holidays. You have access to a nurse at all times for urgent questions. Please call the main number to the clinic Dept: [phone] and follow the prompts.   For any non-urgent questions, you may also contact your provider using MyChart. We now offer e-Visits for anyone 17 and older to request care online for non-urgent symptoms. For details visit [email or web address].   Also download the MyChart app! Go to the app store, search "MyChart", open the app, select [place], and log in with your MyChart username and password.

## 2023-09-20 DIAGNOSIS — L821 Other seborrheic keratosis: Secondary | ICD-10-CM | POA: Diagnosis not present

## 2023-09-20 DIAGNOSIS — D225 Melanocytic nevi of trunk: Secondary | ICD-10-CM | POA: Diagnosis not present

## 2023-09-20 DIAGNOSIS — L853 Xerosis cutis: Secondary | ICD-10-CM | POA: Diagnosis not present

## 2023-09-20 DIAGNOSIS — L570 Actinic keratosis: Secondary | ICD-10-CM | POA: Diagnosis not present

## 2023-09-20 DIAGNOSIS — L814 Other melanin hyperpigmentation: Secondary | ICD-10-CM | POA: Diagnosis not present

## 2023-09-26 ENCOUNTER — Encounter: Payer: Self-pay | Admitting: Hematology and Oncology

## 2023-09-27 ENCOUNTER — Ambulatory Visit (HOSPITAL_COMMUNITY): Admission: RE | Admit: 2023-09-27 | Payer: 59 | Source: Ambulatory Visit

## 2023-09-30 ENCOUNTER — Ambulatory Visit (HOSPITAL_COMMUNITY)
Admission: RE | Admit: 2023-09-30 | Discharge: 2023-09-30 | Disposition: A | Discharge status: Home / Self Care | Payer: 59 | Source: Ambulatory Visit | Attending: Hematology and Oncology

## 2023-09-30 DIAGNOSIS — C50111 Malignant neoplasm of central portion of right female breast: Secondary | ICD-10-CM | POA: Diagnosis not present

## 2023-09-30 DIAGNOSIS — C787 Secondary malignant neoplasm of liver and intrahepatic bile duct: Secondary | ICD-10-CM | POA: Diagnosis not present

## 2023-09-30 DIAGNOSIS — R918 Other nonspecific abnormal finding of lung field: Secondary | ICD-10-CM | POA: Diagnosis not present

## 2023-09-30 DIAGNOSIS — Z171 Estrogen receptor negative status [ER-]: Secondary | ICD-10-CM | POA: Insufficient documentation

## 2023-09-30 DIAGNOSIS — C50919 Malignant neoplasm of unspecified site of unspecified female breast: Secondary | ICD-10-CM | POA: Diagnosis not present

## 2023-09-30 LAB — GLUCOSE, CAPILLARY: Glucose-Capillary: 79 mg/dL (ref 70–99)

## 2023-09-30 MED ORDER — FLUDEOXYGLUCOSE F - 18 (FDG) INJECTION
6.6800 | Freq: Once | INTRAVENOUS | Status: AC
  Administered 2023-09-30: 6.68 via INTRAVENOUS

## 2023-09-30 MED ORDER — HEPARIN SOD (PORK) LOCK FLUSH 100 UNIT/ML IV SOLN: 500.0000 [IU] | Freq: Once | INTRAVENOUS | Status: DC

## 2023-10-03 MED FILL — Fosaprepitant Dimeglumine For IV Infusion 150 MG (Base Eq): INTRAVENOUS | Qty: 5 | Status: AC

## 2023-10-04 ENCOUNTER — Inpatient Hospital Stay: Payer: 59 | Attending: Hematology and Oncology

## 2023-10-04 ENCOUNTER — Other Ambulatory Visit (HOSPITAL_COMMUNITY): Payer: Self-pay

## 2023-10-04 ENCOUNTER — Telehealth: Payer: Self-pay | Admitting: Pharmacy Technician

## 2023-10-04 ENCOUNTER — Encounter: Payer: Self-pay | Admitting: Hematology and Oncology

## 2023-10-04 ENCOUNTER — Inpatient Hospital Stay: Payer: 59

## 2023-10-04 ENCOUNTER — Inpatient Hospital Stay: Payer: 59 | Attending: Hematology and Oncology | Admitting: Hematology and Oncology

## 2023-10-04 VITALS — BP 121/69 | HR 81 | Temp 97.8°F | Resp 18 | Ht 63.0 in | Wt 136.5 lb

## 2023-10-04 DIAGNOSIS — C787 Secondary malignant neoplasm of liver and intrahepatic bile duct: Secondary | ICD-10-CM | POA: Diagnosis not present

## 2023-10-04 DIAGNOSIS — Z171 Estrogen receptor negative status [ER-]: Secondary | ICD-10-CM

## 2023-10-04 DIAGNOSIS — C50111 Malignant neoplasm of central portion of right female breast: Secondary | ICD-10-CM

## 2023-10-04 DIAGNOSIS — Z5112 Encounter for antineoplastic immunotherapy: Secondary | ICD-10-CM | POA: Insufficient documentation

## 2023-10-04 DIAGNOSIS — Z923 Personal history of irradiation: Secondary | ICD-10-CM | POA: Diagnosis not present

## 2023-10-04 DIAGNOSIS — Z95828 Presence of other vascular implants and grafts: Secondary | ICD-10-CM

## 2023-10-04 DIAGNOSIS — C7800 Secondary malignant neoplasm of unspecified lung: Secondary | ICD-10-CM | POA: Insufficient documentation

## 2023-10-04 DIAGNOSIS — Z79899 Other long term (current) drug therapy: Secondary | ICD-10-CM | POA: Diagnosis not present

## 2023-10-04 LAB — CMP (CANCER CENTER ONLY)
ALT: 34 U/L (ref 0–44)
AST: 39 U/L (ref 15–41)
Albumin: 3.7 g/dL (ref 3.5–5.0)
Alkaline Phosphatase: 123 U/L (ref 38–126)
Anion gap: 6 (ref 5–15)
BUN: 6 mg/dL (ref 6–20)
CO2: 26 mmol/L (ref 22–32)
Calcium: 8.8 mg/dL — ABNORMAL LOW (ref 8.9–10.3)
Chloride: 107 mmol/L (ref 98–111)
Creatinine: 0.55 mg/dL (ref 0.44–1.00)
GFR, Estimated: >60 mL/min (ref >60)
Glucose, Bld: 81 mg/dL (ref 70–99)
Potassium: 4 mmol/L (ref 3.5–5.1)
Sodium: 139 mmol/L (ref 135–145)
Total Bilirubin: 0.3 mg/dL (ref <1.2)
Total Protein: 5.9 g/dL — ABNORMAL LOW (ref 6.5–8.1)

## 2023-10-04 LAB — CBC WITH DIFFERENTIAL (CANCER CENTER ONLY)
Abs Immature Granulocytes: 0.01 10*3/uL (ref 0.00–0.07)
Basophils Absolute: 0 10*3/uL (ref 0.0–0.1)
Basophils Relative: 1 %
Eosinophils Absolute: 0.1 10*3/uL (ref 0.0–0.5)
Eosinophils Relative: 3 %
HCT: 34.7 % — ABNORMAL LOW (ref 36.0–46.0)
Hemoglobin: 11.3 g/dL — ABNORMAL LOW (ref 12.0–15.0)
Immature Granulocytes: 0 %
Lymphocytes Relative: 29 %
Lymphs Abs: 1.2 10*3/uL (ref 0.7–4.0)
MCH: 31.8 pg (ref 26.0–34.0)
MCHC: 32.6 g/dL (ref 30.0–36.0)
MCV: 97.7 fL (ref 80.0–100.0)
Monocytes Absolute: 0.5 10*3/uL (ref 0.1–1.0)
Monocytes Relative: 13 %
Neutro Abs: 2.2 10*3/uL (ref 1.7–7.7)
Neutrophils Relative %: 54 %
Platelet Count: 188 10*3/uL (ref 150–400)
RBC: 3.55 MIL/uL — ABNORMAL LOW (ref 3.87–5.11)
RDW: 15 % (ref 11.5–15.5)
WBC Count: 4.1 10*3/uL (ref 4.0–10.5)
nRBC: 0 % (ref 0.0–0.2)

## 2023-10-04 MED ORDER — TRASTUZUMAB-ANNS CHEMO 150 MG IV SOLR
8.0000 mg/kg | Freq: Once | INTRAVENOUS | Status: AC
  Administered 2023-10-04: 504 mg via INTRAVENOUS
  Filled 2023-10-04: qty 24

## 2023-10-04 MED ORDER — PROCHLORPERAZINE MALEATE 10 MG PO TABS: 10.0000 mg | ORAL_TABLET | Freq: Four times a day (QID) | ORAL | 1 refills | Status: DC | PRN

## 2023-10-04 MED ORDER — SODIUM CHLORIDE 0.9 % IV SOLN: INTRAVENOUS | Status: DC

## 2023-10-04 MED ORDER — TUCATINIB 150 MG PO TABS
300.0000 mg | ORAL_TABLET | Freq: Two times a day (BID) | ORAL | 3 refills | Status: DC
  Filled 2023-10-07: qty 120, 30d supply, fill #0
  Filled 2023-11-07: qty 120, 30d supply, fill #1
  Filled 2023-11-30: qty 120, 30d supply, fill #2
  Filled 2024-01-13: qty 120, 30d supply, fill #3

## 2023-10-04 MED ORDER — LIDOCAINE-PRILOCAINE 2.5-2.5 % EX CREA: TOPICAL_CREAM | CUTANEOUS | 3 refills | Status: DC

## 2023-10-04 MED ORDER — ACETAMINOPHEN 325 MG PO TABS
650.0000 mg | ORAL_TABLET | Freq: Once | ORAL | Status: AC
  Administered 2023-10-04: 650 mg via ORAL
  Filled 2023-10-04: qty 2

## 2023-10-04 MED ORDER — ONDANSETRON HCL 8 MG PO TABS: 8.0000 mg | ORAL_TABLET | Freq: Three times a day (TID) | ORAL | 1 refills | Status: DC | PRN

## 2023-10-04 MED ORDER — HEPARIN SOD (PORK) LOCK FLUSH 100 UNIT/ML IV SOLN
500.0000 [IU] | Freq: Once | INTRAVENOUS | Status: AC | PRN
  Administered 2023-10-04: 500 [IU]

## 2023-10-04 MED ORDER — SODIUM CHLORIDE 0.9% FLUSH
10.0000 mL | Freq: Once | INTRAVENOUS | Status: AC
  Administered 2023-10-04: 10 mL

## 2023-10-04 MED ORDER — SODIUM CHLORIDE 0.9% FLUSH
10.0000 mL | INTRAVENOUS | Status: DC | PRN
  Administered 2023-10-04: 10 mL

## 2023-10-04 MED ORDER — DIPHENHYDRAMINE HCL 25 MG PO CAPS
25.0000 mg | ORAL_CAPSULE | Freq: Once | ORAL | Status: AC
Start: 2023-10-04 — End: 2023-10-04
  Administered 2023-10-04: 25 mg via ORAL
  Filled 2023-10-04: qty 1

## 2023-10-04 MED ORDER — CAPECITABINE 500 MG PO TABS
1000.0000 mg | ORAL_TABLET | Freq: Two times a day (BID) | ORAL | 6 refills | Status: DC
  Filled 2023-10-07: qty 56, 21d supply, fill #0
  Filled 2023-11-07: qty 56, 21d supply, fill #1
  Filled 2023-11-30: qty 56, 21d supply, fill #2
  Filled 2023-12-23: qty 56, 21d supply, fill #3
  Filled 2024-01-13: qty 56, 21d supply, fill #4
  Filled 2024-01-30: qty 56, 21d supply, fill #5

## 2023-10-04 NOTE — Assessment & Plan Note (Addendum)
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  Adjuvant chemotherapy with Taxol and Herceptin followed by Herceptin maintenance completed 07/06/2022 2. breast radiation completed 01/04/2022 3.  Metastatic disease diagnosis: --------------------------------------------------------------------------------------------------------------------- CT chest on 11/01/2022: Right upper lobe nodules largest measuring 5 mm CT chest 12/29/2022: Upper and mid lung zone predominant pulmonary nodules are worrisome for metastatic disease.  Left and right hepatic lobe masses (2.5 cm and 3.9 cm) are new and are worrisome for metastatic disease. PET CT scan 01/17/2023: Hypermetabolic bilobar hepatic (2 masses measuring 2.8 cm SUV 11.1, another mass 4.1 cm SUV 10.3) and pulmonary metastases (right upper lobe lung nodule 5 mm SUV 2.6 right lower lobe 6 mm nodule SUV 1.8)   01/13/23: Liver Biopsy: Met breast cancer ER 0%, PR 0%, Ki67 50%, HER2 3+   Current treatment: Completed 11 cycles of Enhertu (stopping for progression) Chemo toxicities: Mild nausea which resolved with Compazine. Monitoring closely for toxicities.   CT CAP 04/15/2023: Response to treatment in the lung and liver metastases.  (5 mm nodule is 3 mm 6 mm nodule is 2 to 3 mm, right liver subcapsular lesion is 1.7 cm and it used to be 3.5 cm, segment 2 liver lesion 1.3 cm it used to be 2.8 cm)   CT CAP 07/05/2023: Decrease in size of liver mets 0.2 cm was 1.7 cm, 1.1 cm was previously 1.3 cm.  Stable small lung nodules. PET/CT 09/30/2023: Progression of liver metastasis (3.4 cm was 1.7 cm, 2.3 cm was 1.2 cm, additional liver lesions 1.2 cm and 1.3 cm), lung nodule 0.9  cm was 0.6 cm   Recommendation: Change treatment to Xeloda Tucatinib and Herceptin

## 2023-10-04 NOTE — Progress Notes (Signed)
DISCONTINUE ON PATHWAY REGIMEN - Breast     A cycle is every 21 days:     Fam-trastuzumab deruxtecan-nxki   **Always confirm dose/schedule in your pharmacy ordering system**  PRIOR TREATMENT: [id number]: Fam-trastuzumab Deruxtecan 5.4 mg/kg q21 Days  START ON PATHWAY REGIMEN - Breast     Cycle 1: A cycle is 21 days:     Capecitabine      Tucatinib      Trastuzumab-xxxx    Cycles 2 and beyond: A cycle is every 21 days:     Capecitabine      Tucatinib      Trastuzumab-xxxx   **Always confirm dose/schedule in your pharmacy ordering system**  Patient Characteristics: Distant Metastases or Locoregional Recurrent Disease - Unresected, M0 or Locally Advanced Unresectable Disease Progressing after Neoadjuvant and Local Therapies, M0, HER2 Positive, ER Negative, Chemotherapy, Fourth Line and Beyond, Fam-trastuzumab  Deruxtecan or Tucatinib-based Therapy Indicated Therapeutic Status: Distant Metastases HER2 Status: Positive (+) ER Status: Negative (-) PR Status: Negative (-) Line of Therapy: Fourth Line and Beyond Therapy Indicated: Fam-trastuzumab Deruxtecan or Tucatinib-based Therapy Indicated Intent of Therapy: Non-Curative / Palliative Intent, Discussed with Patient

## 2023-10-04 NOTE — Telephone Encounter (Signed)
Oral Oncology Patient Advocate Encounter   Received notification that prior authorization for [name] is required.   PA submitted on 10/04/23 Key [id number] Status is pending   Received notification that prior authorization for capecitabine is required.   PA submitted on 10/04/23 Key [id number] Status is pending     Jinger Neighbors, CPhT-Adv Oncology Pharmacy Patient Advocate [facility] Direct Number: [phone]  Fax: [phone]

## 2023-10-04 NOTE — Patient Instructions (Signed)
[facility] - A DEPT OF [facility]  Discharge Instructions: Thank you for choosing [facility] to provide your oncology and hematology care.   If you have a lab appointment with the Cancer Center, please go directly to the Cancer Center and check in at the registration area.   Wear comfortable clothing and clothing appropriate for easy access to any Portacath or PICC line.   We strive to give you quality time with your provider. You may need to reschedule your appointment if you arrive late (15 or more minutes).  Arriving late affects you and other patients whose appointments are after yours.  Also, if you miss three or more appointments without notifying the office, you may be dismissed from the clinic at the provider's discretion.      For prescription refill requests, have your pharmacy contact our office and allow 72 hours for refills to be completed.    Today you received the following chemotherapy and/or immunotherapy agents: trastuzumab      To help prevent nausea and vomiting after your treatment, we encourage you to take your nausea medication as directed.  BELOW ARE SYMPTOMS THAT SHOULD BE REPORTED IMMEDIATELY: *FEVER GREATER THAN 100.4 F (38 C) OR HIGHER *CHILLS OR SWEATING *NAUSEA AND VOMITING THAT IS NOT CONTROLLED WITH YOUR NAUSEA MEDICATION *UNUSUAL SHORTNESS OF BREATH *UNUSUAL BRUISING OR BLEEDING *URINARY PROBLEMS (pain or burning when urinating, or frequent urination) *BOWEL PROBLEMS (unusual diarrhea, constipation, pain near the anus) TENDERNESS IN MOUTH AND THROAT WITH OR WITHOUT PRESENCE OF ULCERS (sore throat, sores in mouth, or a toothache) UNUSUAL RASH, SWELLING OR PAIN  UNUSUAL VAGINAL DISCHARGE OR ITCHING   Items with * indicate a potential emergency and should be followed up as soon as possible or go to the Emergency Department if any problems should occur.  Please show the CHEMOTHERAPY ALERT CARD or  IMMUNOTHERAPY ALERT CARD at check-in to the Emergency Department and triage nurse.  Should you have questions after your visit or need to cancel or reschedule your appointment, please contact [facility]  Dept: [phone]  and follow the prompts.  Office hours are 8:00 a.m. to 4:30 p.m. Monday - Friday. Please note that voicemails left after 4:00 p.m. may not be returned until the following business day.  We are closed weekends and major holidays. You have access to a nurse at all times for urgent questions. Please call the main number to the clinic Dept: [phone] and follow the prompts.   For any non-urgent questions, you may also contact your provider using MyChart. We now offer e-Visits for anyone 59 and older to request care online for non-urgent symptoms. For details visit [email or web address].   Also download the MyChart app! Go to the app store, search "MyChart", open the app, select [place], and log in with your MyChart username and password.

## 2023-10-04 NOTE — Progress Notes (Signed)
Patient Care Team: Everrett Coombe, DO as PCP - General (Family Medicine) [name], MD as Consulting Physician (Radiation Oncology) Barbaraann Cao Georgeanna Lea, MD as Consulting Physician (Oncology) [facility] [place] (Ophthalmology) Domenic Schwab, Lucianne Lei, MD as Referring Physician (Dermatology) Graylin Shiver, MD as Referring Physician (Otolaryngology) Serena Croissant, MD as Medical Oncologist (Hematology and Oncology) Harriette Bouillon, MD as Consulting Physician (General Surgery)  DIAGNOSIS:  Encounter Diagnosis  Name Primary?   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 No pathogenic variants identified on the [employer] Multi-Cancer Panel+RNA. VUS in POLD1 identified called c.2429C>T. The report date is 07/01/2021.  The Multi-Cancer  Panel + RNA offered by [facility] includes sequencing and/or deletion duplication testing of the following 84 genes: AIP, ALK, APC, ATM, AXIN2,BAP1,  BARD1, BLM, BMPR1A, BRCA1, BRCA2, BRIP1, CASR, CDC73, CDH1, CDK4, CDKN1B, CDKN1C, CDKN2A (p14ARF), CDKN2A (p16INK4a), CEBPA, CHEK2, CTNNA1, DICER1, DIS3L2, EGFR (c.2369C>T, p.Thr790Met variant only), EPCAM (Deletion/duplication testing only), FH, FLCN, GATA2, GPC3, GREM1 (Promoter region deletion/duplication testing only), HOXB13 (c.251G>A, p.Gly84Glu), HRAS, KIT, MAX, MEN1, MET, MITF (c.952G>A, p.Glu318Lys variant only), MLH1, MSH2, MSH3, MSH6, MUTYH, NBN, NF1, NF2, NTHL1, PALB2, PDGFRA, PHOX2B, PMS2, POLD1, POLE, POT1, PRKAR1A, PTCH1, PTEN, RAD50, RAD51C, RAD51D, RB1, RECQL4, RET, RUNX1, SDHAF2, SDHA (sequence changes only), SDHB, SDHC, SDHD, SMAD4, SMARCA4, SMARCB1, SMARCE1, STK11, SUFU, TERC, TERT, TMEM127, TP53, TSC1, TSC2, VHL, WRN and WT1.   06/18/2021 Cancer Staging   Staging form: Breast, AJCC 8th Edition - Pathologic stage from 06/18/2021: Stage IA (pT1c, pN0, cM0, G3, ER-, PR-, HER2+) - Signed by [name], NP on 10/05/2022 Stage prefix: Initial diagnosis Histologic grading system: 3 grade system   07/21/2021 - 07/06/2022 Chemotherapy   Patient is on Treatment Plan : BREAST Paclitaxel + Trastuzumab q7d / Trastuzumab q21d     11/25/2021 - 01/04/2022 Radiation Therapy   Site Technique Total Dose (Gy) Dose per Fx (Gy) Completed Fx Beam Energies  Breast, Right: Breast_R 3D 45/45 1.8 25/25 6XFFF  Breast, Right: Breast_R_Bst specialPort 5.4/5.4 1.8 3/3 12E     12/29/2022 Progression   CT chest on 11/01/2022: Right upper lobe nodules largest measuring 5 mm CT chest 12/29/2022: Upper and mid lung zone predominant pulmonary nodules are worrisome for metastatic disease.  Left and right hepatic lobe masses (2.5 cm and 3.9 cm) are new and are worrisome for metastatic  disease.   01/13/2023 Initial Biopsy   Right liver biopsy: Metastatic carcinoma  with breast primary, ER-, PR-, HER2 +, Ki-67 50%.    01/13/2023 Cancer Staging   Staging form: Breast, AJCC 8th Edition - Pathologic stage from 01/13/2023: Stage IV (pM1, G3, ER-, PR-, HER2+) - Signed by [name], NP on 02/07/2023 Histologic grading system: 3 grade system   01/17/2023 PET scan   PET CT scan 01/17/2023: Hypermetabolic bilobar hepatic (2 masses measuring 2.8 cm SUV 11.1, another mass 4.1 cm SUV 10.3) and pulmonary metastases (right upper lobe lung nodule 5 mm SUV 2.6 right lower lobe 6 mm nodule SUV 1.8)    02/07/2023 - 09/13/2023 Chemotherapy   Patient is on Treatment Plan : BREAST METASTATIC Fam-Trastuzumab Deruxtecan-nxki (Enhertu) (5.4) q21d     10/04/2023 -  Chemotherapy   Patient is on Treatment Plan : BREAST Trastuzumab (8/6) IV D1 + Capecitabine +  Tucatinib q21d       CHIEF COMPLIANT: Follow-up to discuss result of PET/CT scan  HISTORY OF PRESENT ILLNESS:   History of Present Illness   Cache, a patient with a history of HER2 positive cancer, presents with progression of her disease despite chemotherapy. She reports that her liver spots have grown, which was confirmed by recent scans. This growth was unexpected as she had been responding well to the current chemotherapy regimen, with previous CT scans showing promising results. The patient expresses concern about the progression of her disease and questions whether her diet or lifestyle could be contributing to the growth of the cancer. She also expresses concern about the potential side effects of the new treatment plan.         ALLERGIES:  has no known allergies.  MEDICATIONS:  Current Outpatient Medications  Medication Sig Dispense Refill   acetaminophen (TYLENOL) 500 MG tablet Take 500 mg by mouth as needed.     ALPRAZolam (XANAX) 0.25 MG tablet TAKE 1 TABLET BY MOUTH TWICE A DAY AS NEEDED FOR ANXIETY 30 tablet 3   capecitabine (XELODA) 500 MG tablet Take 2 tablets (1,000 mg total) by mouth 2  (two) times daily before a meal. Take for 14 days on, 7 days off. Repeat every 21 days. 56 tablet 6   diazepam (VALIUM) 2 MG tablet Take by mouth as needed (dizziness).     Famotidine-Ca Carb-Mag Hydrox (PEPCID COMPLETE PO) Take by mouth as needed.     lidocaine-prilocaine (EMLA) cream Apply to affected area once 30 g 3   ondansetron (ZOFRAN) 8 MG tablet Take 1 tablet (8 mg total) by mouth every 8 (eight) hours as needed for nausea or vomiting. 30 tablet 1   prochlorperazine (COMPAZINE) 10 MG tablet Take 1 tablet (10 mg total) by mouth every 6 (six) hours as needed for nausea or vomiting. 30 tablet 1   triamcinolone cream (KENALOG) 0.1 % Apply 1 Application topically 2 (two) times daily. 30 g 0   tucatinib (TUKYSA) 150 MG tablet Take 2 tablets (300 mg total) by mouth 2 (two) times daily. Take every 12 hrs at the same time each day with or without a meal. 120 tablet 3   No current facility-administered medications for this visit.   Facility-Administered Medications Ordered in Other Visits  Medication Dose Route Frequency Provider Last Rate Last Admin   heparin lock flush 100 unit/mL  500 Units Intravenous Once Serena Croissant, MD        PHYSICAL EXAMINATION: ECOG PERFORMANCE STATUS: 1 - Symptomatic but completely ambulatory  Vitals:  10/04/23 0850  BP: 121/69  Pulse: 81  Resp: 18  Temp: 97.8 F (36.6 C)  SpO2: 100%   Filed Weights   10/04/23 0850  Weight: 136 lb 8 oz (61.9 kg)      LABORATORY DATA:  I have reviewed the data as listed    Latest Ref Rng & Units 10/04/2023    8:24 AM 09/13/2023    8:28 AM 08/23/2023    8:15 AM  CMP  Glucose 70 - 99 mg/dL 81  86  92   BUN 6 - 20 mg/dL 6  6  8    Creatinine 0.44 - 1.00 mg/dL 0.86  5.78  4.69   Sodium 135 - 145 mmol/L 139  138  140   Potassium 3.5 - 5.1 mmol/L 4.0  4.0  3.6   Chloride 98 - 111 mmol/L 107  108  109   CO2 22 - 32 mmol/L 26  26  26    Calcium 8.9 - 10.3 mg/dL 8.8  8.7  8.7   Total Protein 6.5 - 8.1 g/dL 5.9  6.2   6.2   Total Bilirubin <1.2 mg/dL 0.3  0.3  0.4   Alkaline Phos 38 - 126 U/L 123  110  118   AST 15 - 41 U/L 39  31  34   ALT 0 - 44 U/L 34  27  33     Lab Results  Component Value Date   WBC 4.1 10/04/2023   HGB 11.3 (L) 10/04/2023   HCT 34.7 (L) 10/04/2023   MCV 97.7 10/04/2023   PLT 188 10/04/2023   NEUTROABS 2.2 10/04/2023    ASSESSMENT & PLAN:  Malignant neoplasm of central portion of right breast in female, estrogen receptor negative (HCC) 06/18/2021:Right lumpectomy: 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  Adjuvant chemotherapy with Taxol and Herceptin followed by Herceptin maintenance completed 07/06/2022 2. breast radiation completed 01/04/2022 3.  Metastatic disease diagnosis: --------------------------------------------------------------------------------------------------------------------- CT chest on 11/01/2022: Right upper lobe nodules largest measuring 5 mm CT chest 12/29/2022: Upper and mid lung zone predominant pulmonary nodules are worrisome for metastatic disease.  Left and right hepatic lobe masses (2.5 cm and 3.9 cm) are new and are worrisome for metastatic disease. PET CT scan 01/17/2023: Hypermetabolic bilobar hepatic (2 masses measuring 2.8 cm SUV 11.1, another mass 4.1 cm SUV 10.3) and pulmonary metastases (right upper lobe lung nodule 5 mm SUV 2.6 right lower lobe 6 mm nodule SUV 1.8)   01/13/23: Liver Biopsy: Met breast cancer ER 0%, PR 0%, Ki67 50%, HER2 3+   Current treatment: Completed 11 cycles of Enhertu (stopping for progression) Chemo toxicities: Mild nausea which resolved with Compazine. Monitoring closely for toxicities.   CT CAP 04/15/2023: Response to treatment in the lung and  liver metastases.  (5 mm nodule is 3 mm 6 mm nodule is 2 to 3 mm, right liver subcapsular lesion is 1.7 cm and it used to be 3.5 cm, segment 2 liver lesion 1.3 cm it used to be 2.8 cm)   CT CAP 07/05/2023: Decrease in size of liver mets 0.2 cm was 1.7 cm, 1.1 cm was previously 1.3 cm.  Stable small lung nodules. PET/CT 09/30/2023: Progression of liver metastasis (3.4 cm was  1.7 cm, 2.3 cm was 1.2 cm, additional liver lesions 1.2 cm and 1.3 cm), lung nodule 0.9 cm was 0.6 cm   Recommendation: Change treatment to Xeloda Tucatinib and Herceptin Discussed risks and benefits of chemotherapy with Xeloda including diarrhea and hand-foot syndrome as well as set off Tucatinib and Herceptin. We are able to start her Herceptin today. She will come in a week to meet with Jonny Ruiz to discuss starting her new treatment with Tucatinib and Xeloda. Prescriptions have been sent.   Orders Placed This Encounter  Procedures   Consent Attestation for Oncology Treatment    The patient is informed of risks, benefits, side-effects of the prescribed oncology treatment. Potential short term and long term side effects and response rates discussed. After a long discussion, the patient made informed decision to proceed.:   Yes   CBC with Differential (Cancer Center Only)    Standing Status:   Standing    Number of Occurrences:   50    Expiration Date:   10/03/2024   CMP (Cancer Center only)    Standing Status:   Standing    Number of Occurrences:   50    Expiration Date:   10/03/2024   CA 27.29    Standing Status:   Standing    Number of Occurrences:   50    Expiration Date:   10/03/2024   [facility] PHYSICIAN COMMUNICATION #2    Diarrhea: Severe diarrhea, including dehydration, acute kidney injury, and death, has been reported with [place]. Administer antidiarrheal treatment as clinically indicated. Interrupt dose, then dose reduce, or permanently discontinue Tukysa based on severity.   ON[facility] PHYSICIAN COMMUNICATION 3    A  baseline Echo/Muga should be obtained prior to initiation of Trastuzumab, at 3, 6, 9 months during Trastuzumab treatment.   The patient has a good understanding of the overall plan. she agrees with it. she will call with any problems that may develop before the next visit here. Total time spent: 45 mins including face to face time and time spent for planning, charting and co-ordination of care   Tamsen Meek, MD 10/04/23

## 2023-10-04 NOTE — Telephone Encounter (Signed)
Oral Oncology Patient Advocate Encounter  Prior Authorization for [name] has been approved.    PA#  [id number] Effective dates: 10/04/23 through 10/17/24  Patients co-pay is $0.   Prior Authorization for capecitabine has been approved.    PA# [id number] Effective dates: 10/04/23 through until  Patients co-pay is $0.   Jinger Neighbors, CPhT-Adv Oncology Pharmacy Patient Advocate [facility] Direct Number: [phone]  Fax: [phone]

## 2023-10-07 ENCOUNTER — Other Ambulatory Visit: Payer: Self-pay | Admitting: Pharmacy Technician

## 2023-10-07 ENCOUNTER — Other Ambulatory Visit: Payer: Self-pay

## 2023-10-07 NOTE — Progress Notes (Unsigned)
Specialty Pharmacy Initial Fill Coordination Note  Lori Mcmillan is a 62 y.o. female contacted today regarding refills of specialty medication(s) Capecitabine (XELODA); Tucatinib (TUKYSA) .  Patient requested [name] at [facility] at [place]  on 10/13/23   Medication will be filled on 10/11/23.   Patient is aware of $0 copayment.

## 2023-10-11 ENCOUNTER — Telehealth: Payer: Self-pay | Admitting: Pharmacy Technician

## 2023-10-11 ENCOUNTER — Encounter: Payer: Self-pay | Admitting: Hematology and Oncology

## 2023-10-11 ENCOUNTER — Other Ambulatory Visit: Payer: Self-pay

## 2023-10-11 ENCOUNTER — Other Ambulatory Visit (HOSPITAL_COMMUNITY): Payer: Self-pay

## 2023-10-11 NOTE — Telephone Encounter (Signed)
Oral Oncology Patient Advocate Encounter  Was successful in securing patient a $15,000 grant from [facility] to provide copayment coverage for Tukysa/capecitabine.  This will keep the out of pocket expense at $0.     [employer] ID: [id number]   The billing information is as follows and has been shared with [facility].    RxBin: [id number] PCN: [id number] Member ID: [id number] Group ID: [id number] Dates of Eligibility: 09/11/23 through 09/09/24  Fund:  Breast   Jinger Neighbors, CPhT-Adv Oncology Pharmacy Patient Advocate [facility] Direct Number: [phone]  Fax: [phone]

## 2023-10-13 ENCOUNTER — Inpatient Hospital Stay: Payer: 59 | Admitting: Pharmacist

## 2023-10-13 ENCOUNTER — Other Ambulatory Visit: Payer: 59

## 2023-10-13 ENCOUNTER — Encounter: Payer: Self-pay | Admitting: Hematology and Oncology

## 2023-10-13 ENCOUNTER — Other Ambulatory Visit (HOSPITAL_COMMUNITY): Payer: Self-pay

## 2023-10-13 VITALS — BP 130/82 | HR 83 | Temp 97.7°F | Resp 17 | Ht 63.0 in | Wt 139.1 lb

## 2023-10-13 DIAGNOSIS — Z171 Estrogen receptor negative status [ER-]: Secondary | ICD-10-CM

## 2023-10-13 DIAGNOSIS — Z5112 Encounter for antineoplastic immunotherapy: Secondary | ICD-10-CM | POA: Diagnosis not present

## 2023-10-13 DIAGNOSIS — C787 Secondary malignant neoplasm of liver and intrahepatic bile duct: Secondary | ICD-10-CM | POA: Diagnosis not present

## 2023-10-13 DIAGNOSIS — Z79899 Other long term (current) drug therapy: Secondary | ICD-10-CM | POA: Diagnosis not present

## 2023-10-13 DIAGNOSIS — C7800 Secondary malignant neoplasm of unspecified lung: Secondary | ICD-10-CM | POA: Diagnosis not present

## 2023-10-13 DIAGNOSIS — C50111 Malignant neoplasm of central portion of right female breast: Secondary | ICD-10-CM | POA: Diagnosis not present

## 2023-10-13 DIAGNOSIS — Z923 Personal history of irradiation: Secondary | ICD-10-CM | POA: Diagnosis not present

## 2023-10-13 NOTE — Progress Notes (Signed)
[facility]       Telephone: [phone]?Fax: [phone]   Oncology Clinical Pharmacist Practitioner Initial Assessment  Lori Mcmillan is a 62 y.o. female with a diagnosis of breast cancer. They were contacted today via in-person visit. She is accompanied by her friend Lori Mcmillan  Indication/Regimen Trastuzumab(Herceptin), capecitabine (Xeloda), and tucatinib Lori Mcmillan) are being used appropriately for treatment of metastatic breast cancer by Dr. Serena Croissant.      Wt Readings from Last 1 Encounters:  10/13/23 139 lb 1.6 oz (63.1 kg)    Estimated body surface area is 1.67 meters squared as calculated from the following:   Height as of this encounter: 5\' 3"  (1.6 m).   Weight as of this encounter: 139 lb 1.6 oz (63.1 kg).  Trastuzumab: The dosing regimen is  378 mg (6 mg/kg) intravenously on day 1 of a 21-day cycle  Capecitabine: the dosing regimen is  1000 mg by mouth every 12 hours on days 1 to 14 of a 21-day cycle  Tucatinib: the dosing regimen is  300 mg by mouth every 12 hours on days 1 to 28 of a 28-day cycle.   This treatment regimen is planned to continue until disease progression or unacceptable toxicity. Prescription dose and frequency assessed for appropriateness.   Patient has agreed to treatment which is documented in physician note on 10/04/23. Counseled patient on administration, dosing, side effects, monitoring, drug-food interactions, safe handling, storage, and disposal.  Dose Modifications None at this time per Dr. Pamelia Hoit   Access Assessment Lori Mcmillan will be receiving capecitabine and tucatinib through [facility] Concerns: none Start date if known Trastuzumab (10/04/23) Capecitabine (planned 10/27/23 per pt request) Tucatinib (planned 10/27/23 per pt request)  Adherence Assessment Reviewed importance on keeping a med schedule and plan for any missed doses Barriers to adherence identified?  No  Allergies No Known Allergies  Vitals    10/13/2023   10:25 AM 10/04/2023    8:50 AM 09/13/2023   11:17 AM  Oncology Vitals  Height 160 cm 160 cm   Weight 63.095 kg 61.916 kg   Weight (lbs) 139 lbs 2 oz 136 lbs 8 oz   BMI 24.64 kg/m2 24.18 kg/m2   Temp 97.7 F (36.5 C) 97.8 F (36.6 C)   Pulse Rate 83 81 76  BP 130/82 121/69 108/64  Resp 17 18   SpO2 99 % 100 % 100 %  BSA (m2) 1.67 m2 1.66 m2      Laboratory Data    Latest Ref Rng & Units 10/04/2023    8:24 AM 09/13/2023    8:28 AM 08/23/2023    8:15 AM  CBC EXTENDED  WBC 4.0 - 10.5 K/uL 4.1  4.2  4.1   RBC 3.87 - 5.11 MIL/uL 3.55  3.59  3.55   Hemoglobin 12.0 - 15.0 g/dL 29.5  62.1  30.8   HCT 36.0 - 46.0 % 34.7  34.7  34.0   Platelets 150 - 400 K/uL 188  187  170   NEUT# 1.7 - 7.7 K/uL 2.2  2.3  2.2   Lymph# 0.7 - 4.0 K/uL 1.2  1.2  1.2        Latest Ref Rng & Units 10/04/2023    8:24 AM 09/13/2023    8:28 AM 08/23/2023    8:15 AM  CMP  Glucose 70 - 99 mg/dL 81  86  92   BUN 6 - 20 mg/dL 6  6  8  Creatinine 0.44 - 1.00 mg/dL 1.30  8.65  7.84   Sodium 135 - 145 mmol/L 139  138  140   Potassium 3.5 - 5.1 mmol/L 4.0  4.0  3.6   Chloride 98 - 111 mmol/L 107  108  109   CO2 22 - 32 mmol/L 26  26  26    Calcium 8.9 - 10.3 mg/dL 8.8  8.7  8.7   Total Protein 6.5 - 8.1 g/dL 5.9  6.2  6.2   Total Bilirubin <1.2 mg/dL 0.3  0.3  0.4   Alkaline Phos 38 - 126 U/L 123  110  118   AST 15 - 41 U/L 39  31  34   ALT 0 - 44 U/L 34  27  33     Contraindications Contraindications were reviewed? Yes Contraindications to therapy were identified? 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 Referral sent to PGY-2 Resident Lori Mcmillan Nausea / Vomiting Fatigue Alopecia Dehydration, electrolyte abnormalities Renal  Toxicity Cardiotoxicity Stomatitis Fever Neutropenia: we discussed the importance of having a thermometer and what the Centers for Disease Control and Prevention (CDC) considers a fever which is 100.12F (38C) or higher.  Gave patient 24/7 triage line to call if any fevers or symptoms Storage and handling  Medication Reconciliation Current Outpatient Medications  Medication Sig Dispense Refill   acetaminophen (TYLENOL) 500 MG tablet Take 500 mg by mouth as needed.     ALPRAZolam (XANAX) 0.25 MG tablet TAKE 1 TABLET BY MOUTH TWICE A DAY AS NEEDED FOR ANXIETY 30 tablet 3   capecitabine (XELODA) 500 MG tablet Take 2 tablets (1,000 mg total) by mouth 2 (two) times daily before a meal. Take for 14 days on, 7 days off. Repeat every 21 days. 56 tablet 6   diazepam (VALIUM) 2 MG tablet Take by mouth as needed (dizziness).     Famotidine-Ca Carb-Mag Hydrox (PEPCID COMPLETE PO) Take by mouth as needed.     lidocaine-prilocaine (EMLA) cream Apply to affected area once 30 g 3   ondansetron (ZOFRAN) 8 MG tablet Take 1 tablet (8 mg total) by mouth every 8 (eight) hours as needed for nausea or vomiting. 30 tablet 1   prochlorperazine (COMPAZINE) 10 MG tablet Take 1 tablet (10 mg total) by mouth every 6 (six) hours as needed for nausea or vomiting. 30 tablet 1   tucatinib (TUKYSA) 150 MG tablet Take 2 tablets (300 mg total) by mouth 2 (two) times daily. Take every 12 hrs at the same time each day with or without a meal. 120 tablet 3   No current facility-administered medications for this visit.   Medication reconciliation is based on the patient's most recent medication list in the electronic medical record (EMR) including herbal products and OTC medications.   The patient's medication list was reviewed today with the patient? Yes   Drug-drug interactions (DDIs) DDIs were evaluated? Yes Significant DDIs identified?  Diazepam and alprazolam taken PRN and not taken often  Drug-Food  Interactions Drug-food interactions were evaluated? Yes Drug-food interactions identified? No   Follow-up Plan  Patient education handout given to patient Continue trastuzumab IV every 21 days. Next due 10/26/23 Start capecitabine 1000 mg BID 14 days out of 21 day cycle. Prefers to start on 10/27/23 Start tucatinib 300 mg by mouth BID. Prefers to start on 10/27/23 Will  pick up both tucatinib and capecitabine today from [facility]. Calendar given for capecitabine Port flush with labs, Lillard Anes NP visit, and trastuzumab on 10/26/23 scheduled May follow up with clinical pharmacy as needed going forward   Lennox Pippins participated in the discussion, expressed understanding, and voiced agreement with the above plan. All questions were answered to her satisfaction. The patient was advised to contact the clinic at [phone] with any questions or concerns prior to her return visit.   I spent 60 minutes assessing the patient.  Manon Banbury A. Odetta Pink, PharmD, BCOP, CPP  Anselm Lis, RPH-CPP, 10/13/2023 11:33 AM  **Disclaimer: This note was dictated with voice recognition software. Similar sounding words can inadvertently be transcribed and this note may contain transcription errors which may not have been corrected upon publication of note.**

## 2023-10-13 NOTE — Progress Notes (Signed)
Patient counseled in clinic visit note on 10/13/23

## 2023-10-18 ENCOUNTER — Other Ambulatory Visit: Payer: Self-pay

## 2023-10-18 DIAGNOSIS — Z171 Estrogen receptor negative status [ER-]: Secondary | ICD-10-CM

## 2023-10-18 NOTE — Progress Notes (Signed)
 Research Note - Consent Authorization   Study Name: Evaluation of Topical Diclofenac  Use on the Incidence and Severity of Hand Foot Syndrome in Patients with Breast and Gastrointestinal Malignancies Taking Capecitabine    IRB# [id number]   A summary of the study was presented to the patient, including potential risks and benefits from study participation, alternatives treatment options available other than study participation, how their confidentiality will be maintained, and who to contact if they had questions regarding their rights as a study participant or if they experience an injury associated with their participation. They were told that participation is completely voluntary and choosing not to participate would not impact care they would otherwise have access to. They were also told that even if they choose to participate, they can withdraw their consent to participate at any time in the future. Any questions the patient asked were addressed by a member of the research team.   After having all of their questions answered, the patient agreed to participate in the study by confirming their willingness to consent to participation verbally over the phone on 10/18/2023 at 4:28 PM.     Corean Sever, PharmD PGY2 Oncology Pharmacy Resident  Principal Investigator  [phone]

## 2023-10-19 ENCOUNTER — Encounter: Payer: Self-pay | Admitting: Hematology and Oncology

## 2023-10-19 MED ORDER — DICLOFENAC SODIUM 1 % EX GEL
CUTANEOUS | 0 refills | Status: DC
  Filled 2023-10-19: qty 400, 84d supply, fill #0

## 2023-10-20 ENCOUNTER — Ambulatory Visit: Payer: 59

## 2023-10-20 ENCOUNTER — Other Ambulatory Visit (HOSPITAL_COMMUNITY): Payer: Self-pay

## 2023-10-20 ENCOUNTER — Other Ambulatory Visit: Payer: Self-pay

## 2023-10-20 ENCOUNTER — Encounter: Payer: Self-pay | Admitting: Hematology and Oncology

## 2023-10-24 ENCOUNTER — Ambulatory Visit: Payer: 59

## 2023-10-25 ENCOUNTER — Other Ambulatory Visit (HOSPITAL_COMMUNITY): Payer: Self-pay

## 2023-10-26 ENCOUNTER — Inpatient Hospital Stay: Payer: 59

## 2023-10-26 ENCOUNTER — Encounter: Payer: Self-pay | Admitting: Adult Health

## 2023-10-26 ENCOUNTER — Inpatient Hospital Stay: Payer: 59 | Attending: Hematology and Oncology | Admitting: Adult Health

## 2023-10-26 ENCOUNTER — Telehealth: Payer: Self-pay

## 2023-10-26 VITALS — BP 115/64 | HR 80 | Temp 97.9°F | Resp 17 | Wt 135.0 lb

## 2023-10-26 DIAGNOSIS — Z923 Personal history of irradiation: Secondary | ICD-10-CM | POA: Insufficient documentation

## 2023-10-26 DIAGNOSIS — Z171 Estrogen receptor negative status [ER-]: Secondary | ICD-10-CM | POA: Insufficient documentation

## 2023-10-26 DIAGNOSIS — Z87891 Personal history of nicotine dependence: Secondary | ICD-10-CM | POA: Diagnosis not present

## 2023-10-26 DIAGNOSIS — C787 Secondary malignant neoplasm of liver and intrahepatic bile duct: Secondary | ICD-10-CM | POA: Insufficient documentation

## 2023-10-26 DIAGNOSIS — C50111 Malignant neoplasm of central portion of right female breast: Secondary | ICD-10-CM | POA: Insufficient documentation

## 2023-10-26 DIAGNOSIS — Z95828 Presence of other vascular implants and grafts: Secondary | ICD-10-CM

## 2023-10-26 DIAGNOSIS — Z5112 Encounter for antineoplastic immunotherapy: Secondary | ICD-10-CM | POA: Insufficient documentation

## 2023-10-26 DIAGNOSIS — Z1731 Human epidermal growth factor receptor 2 positive status: Secondary | ICD-10-CM | POA: Diagnosis not present

## 2023-10-26 DIAGNOSIS — Z09 Encounter for follow-up examination after completed treatment for conditions other than malignant neoplasm: Secondary | ICD-10-CM | POA: Diagnosis not present

## 2023-10-26 DIAGNOSIS — Z1722 Progesterone receptor negative status: Secondary | ICD-10-CM | POA: Diagnosis not present

## 2023-10-26 DIAGNOSIS — C7800 Secondary malignant neoplasm of unspecified lung: Secondary | ICD-10-CM | POA: Insufficient documentation

## 2023-10-26 LAB — CBC WITH DIFFERENTIAL (CANCER CENTER ONLY)
Abs Immature Granulocytes: 0.01 10*3/uL (ref 0.00–0.07)
Basophils Absolute: 0 10*3/uL (ref 0.0–0.1)
Basophils Relative: 1 %
Eosinophils Absolute: 0.2 10*3/uL (ref 0.0–0.5)
Eosinophils Relative: 4 %
HCT: 36.1 % (ref 36.0–46.0)
Hemoglobin: 11.9 g/dL — ABNORMAL LOW (ref 12.0–15.0)
Immature Granulocytes: 0 %
Lymphocytes Relative: 28 %
Lymphs Abs: 1.5 10*3/uL (ref 0.7–4.0)
MCH: 31.2 pg (ref 26.0–34.0)
MCHC: 33 g/dL (ref 30.0–36.0)
MCV: 94.8 fL (ref 80.0–100.0)
Monocytes Absolute: 0.6 10*3/uL (ref 0.1–1.0)
Monocytes Relative: 11 %
Neutro Abs: 3.1 10*3/uL (ref 1.7–7.7)
Neutrophils Relative %: 56 %
Platelet Count: 163 10*3/uL (ref 150–400)
RBC: 3.81 MIL/uL — ABNORMAL LOW (ref 3.87–5.11)
RDW: 13.4 % (ref 11.5–15.5)
WBC Count: 5.4 10*3/uL (ref 4.0–10.5)
nRBC: 0 % (ref 0.0–0.2)

## 2023-10-26 LAB — CMP (CANCER CENTER ONLY)
ALT: 39 U/L (ref 0–44)
AST: 43 U/L — ABNORMAL HIGH (ref 15–41)
Albumin: 3.9 g/dL (ref 3.5–5.0)
Alkaline Phosphatase: 155 U/L — ABNORMAL HIGH (ref 38–126)
Anion gap: 6 (ref 5–15)
BUN: 7 mg/dL (ref 6–20)
CO2: 26 mmol/L (ref 22–32)
Calcium: 9 mg/dL (ref 8.9–10.3)
Chloride: 107 mmol/L (ref 98–111)
Creatinine: 0.6 mg/dL (ref 0.44–1.00)
GFR, Estimated: >60 mL/min (ref >60)
Glucose, Bld: 76 mg/dL (ref 70–99)
Potassium: 4.1 mmol/L (ref 3.5–5.1)
Sodium: 139 mmol/L (ref 135–145)
Total Bilirubin: 0.3 mg/dL (ref 0.0–1.2)
Total Protein: 6.5 g/dL (ref 6.5–8.1)

## 2023-10-26 MED ORDER — SODIUM CHLORIDE 0.9% FLUSH
10.0000 mL | Freq: Once | INTRAVENOUS | Status: AC
  Administered 2023-10-26: 10 mL

## 2023-10-26 MED ORDER — DIPHENHYDRAMINE HCL 25 MG PO CAPS
25.0000 mg | ORAL_CAPSULE | Freq: Once | ORAL | Status: AC
  Administered 2023-10-26: 25 mg via ORAL
  Filled 2023-10-26: qty 1

## 2023-10-26 MED ORDER — SODIUM CHLORIDE 0.9 % IV SOLN: INTRAVENOUS | Status: DC

## 2023-10-26 MED ORDER — SODIUM CHLORIDE 0.9 % IV SOLN
6.0000 mg/kg | Freq: Once | INTRAVENOUS | Status: AC
  Administered 2023-10-26: 378 mg via INTRAVENOUS
  Filled 2023-10-26: qty 18

## 2023-10-26 MED ORDER — HEPARIN SOD (PORK) LOCK FLUSH 100 UNIT/ML IV SOLN
500.0000 [IU] | Freq: Once | INTRAVENOUS | Status: AC | PRN
  Administered 2023-10-26: 500 [IU]

## 2023-10-26 MED ORDER — SODIUM CHLORIDE 0.9% FLUSH
10.0000 mL | INTRAVENOUS | Status: DC | PRN
  Administered 2023-10-26: 10 mL

## 2023-10-26 MED ORDER — ACETAMINOPHEN 325 MG PO TABS
650.0000 mg | ORAL_TABLET | Freq: Once | ORAL | Status: AC
  Administered 2023-10-26: 650 mg via ORAL
  Filled 2023-10-26: qty 2

## 2023-10-26 NOTE — Assessment & Plan Note (Signed)
 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  Adjuvant chemotherapy with Taxol  and Herceptin  followed by Herceptin  maintenance completed 07/06/2022 2. breast radiation completed 01/04/2022 3.  Metastatic disease diagnosis: --------------------------------------------------------------------------------------------------------------------- CT chest on 11/01/2022: Right upper lobe nodules largest measuring 5 mm CT chest 12/29/2022: Upper and mid lung zone predominant pulmonary nodules are worrisome for metastatic disease.  Left and right hepatic lobe masses (2.5 cm and 3.9 cm) are new and are worrisome for metastatic disease. PET CT scan 01/17/2023: Hypermetabolic bilobar hepatic (2 masses measuring 2.8 cm SUV 11.1, another mass 4.1 cm SUV 10.3) and pulmonary metastases (right upper lobe lung nodule 5 mm SUV 2.6 right lower lobe 6 mm nodule SUV 1.8)  01/13/23: Liver Biopsy: Met breast cancer ER 0%, PR 0%, Ki67 50%, HER2 3+  CT CAP 04/15/2023: Response to treatment in the lung and liver metastases.  (5 mm nodule is 3 mm 6 mm nodule is 2 to 3 mm, right liver subcapsular lesion is 1.7 cm and it used to be 3.5 cm, segment 2 liver lesion 1.3 cm it used to be 2.8 cm) CT CAP 07/05/2023: Decrease in size of liver mets 0.2 cm was 1.7 cm, 1.1 cm was previously 1.3 cm.  Stable small lung nodules. PET/CT 09/30/2023: Progression of liver metastasis (3.4 cm was 1.7 cm, 2.3 cm was 1.2 cm, additional liver lesions 1.2 cm and 1.3 cm), lung nodule 0.9 cm was 0.6 cm   Current treatment: Herceptin , Tucatinib , Capecitabine  Chemo toxicities: Reviewed labs with her in detail. Recent echo shows preserved EF in 08/2023--repeat echo  ordered for February.   Proceed with Herceptin  today, to start tucatinib , capecitabine  orally tomorrow.  She received the voltaren  gel to use on her hand and feet as part of research trial. Recommended against curcurmin, however will review with our pharmacist, Norleen Parkinson.    RTC in 3 weeks for labs, f/u, and treatment.

## 2023-10-26 NOTE — Telephone Encounter (Signed)
 D.  Can you set patient up for a virtual visit with a genetic counselor? Message was sent to scheduler.

## 2023-10-26 NOTE — Patient Instructions (Signed)

## 2023-10-26 NOTE — Progress Notes (Signed)
 [facility] Cancer Follow up:    Lori Bring, DO [address]  [address] [place] [address]   DIAGNOSIS:  Cancer Staging  Malignant neoplasm of central portion of right breast in female, estrogen receptor negative (HCC) Staging form: Breast, AJCC 6th Edition - Pathologic stage from 01/04/2012: Stage I (T1c, N0, M0) - Signed by Crawford Morna Pickle, NP on 10/05/2022 Histologic grade (G): G1 Staging form: Breast, AJCC 8th Edition - Pathologic stage from 06/18/2021: Stage IA (pT1c, pN0, cM0, G3, ER-, PR-, HER2+) - Signed by Crawford Morna Pickle, NP on 10/05/2022 Stage prefix: Initial diagnosis Histologic grading system: 3 grade system - Pathologic stage from 01/13/2023: Stage IV (pM1, G3, ER-, PR-, HER2+) - Signed by Crawford Morna Pickle, NP on 02/07/2023 Histologic grading system: 3 grade system   SUMMARY OF ONCOLOGIC HISTORY: Oncology History  Malignant neoplasm of central portion of right breast in female, estrogen receptor negative (HCC)  01/04/2012 Initial Diagnosis   Right breast cancer: Stage Ia ER/PR positive HER2 negative status postlumpectomy, radiation (low risk Oncotype) could not tolerate tamoxifen  for more than 30 days.   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  Biopsy revealed grade 3 IDC ER/PR negative HER2 positive with a Ki-67 of 30%   06/18/2021 Surgery   Right lumpectomy: Grade 3 IDC 1.5 cm, high-grade DCIS, margins negative, 0/2 lymph nodes negative, ER 0%, PR 0%, HER2 3+ positive, Ki-67 30%    Genetic  Testing   Negative genetic testing. No pathogenic variants identified on the [facility] Multi-Cancer Panel+RNA. VUS in POLD1 identified called c.2429C>T. The report date is 07/01/2021.  The Multi-Cancer Panel + RNA offered by [facility] includes sequencing and/or deletion duplication testing of the following 84 genes: AIP, ALK, APC, ATM, AXIN2,BAP1,  BARD1, BLM, BMPR1A, BRCA1, BRCA2, BRIP1, CASR, CDC73, CDH1, CDK4, CDKN1B, CDKN1C, CDKN2A (p14ARF), CDKN2A (p16INK4a), CEBPA, CHEK2, CTNNA1, DICER1, DIS3L2, EGFR (c.2369C>T, p.Thr790Met variant only), EPCAM (Deletion/duplication testing only), FH, FLCN, GATA2, GPC3, GREM1 (Promoter region deletion/duplication testing only), HOXB13 (c.251G>A, p.Gly84Glu), HRAS, KIT, MAX, MEN1, MET, MITF (c.952G>A, p.Glu318Lys variant only), MLH1, MSH2, MSH3, MSH6, MUTYH, NBN, NF1, NF2, NTHL1, PALB2, PDGFRA, PHOX2B, PMS2, POLD1, POLE, POT1, PRKAR1A, PTCH1, PTEN, RAD50, RAD51C, RAD51D, RB1, RECQL4, RET, RUNX1, SDHAF2, SDHA (sequence changes only), SDHB, SDHC, SDHD, SMAD4, SMARCA4, SMARCB1, SMARCE1, STK11, SUFU, TERC, TERT, TMEM127, TP53, TSC1, TSC2, VHL, WRN and WT1.   06/18/2021 Cancer Staging   Staging form: Breast, AJCC 8th Edition - Pathologic stage from 06/18/2021: Stage IA (pT1c, pN0, cM0, G3, ER-, PR-, HER2+) - Signed by Crawford Morna Pickle, NP on 10/05/2022 Stage prefix: Initial diagnosis Histologic grading system: 3 grade system   07/21/2021 - 07/06/2022 Chemotherapy   Patient is on Treatment Plan : BREAST Paclitaxel  + Trastuzumab  q7d / Trastuzumab  q21d     11/25/2021 - 01/04/2022 Radiation Therapy   Site Technique Total Dose (Gy) Dose per Fx (Gy) Completed Fx Beam Energies  Breast, Right: Breast_R 3D 45/45 1.8 25/25 6XFFF  Breast, Right: Breast_R_Bst specialPort 5.4/5.4 1.8 3/3 12E     12/29/2022 Progression  CT chest on 11/01/2022: Right upper lobe nodules largest measuring 5 mm CT chest 12/29/2022: Upper and mid lung zone predominant pulmonary nodules are worrisome for  metastatic disease.  Left and right hepatic lobe masses (2.5 cm and 3.9 cm) are new and are worrisome for metastatic disease.   01/13/2023 Initial Biopsy   Right liver biopsy: Metastatic carcinoma with breast primary, ER-, PR-, HER2 +, Ki-67 50%.    01/13/2023 Cancer Staging   Staging form: Breast, AJCC 8th Edition - Pathologic stage from 01/13/2023: Stage IV (pM1, G3, ER-, PR-, HER2+) - Signed by Crawford Morna Pickle, NP on 02/07/2023 Histologic grading system: 3 grade system   01/17/2023 PET scan   PET CT scan 01/17/2023: Hypermetabolic bilobar hepatic (2 masses measuring 2.8 cm SUV 11.1, another mass 4.1 cm SUV 10.3) and pulmonary metastases (right upper lobe lung nodule 5 mm SUV 2.6 right lower lobe 6 mm nodule SUV 1.8)    02/07/2023 - 09/13/2023 Chemotherapy   Patient is on Treatment Plan : BREAST METASTATIC Fam-Trastuzumab Deruxtecan-nxki  (Enhertu ) (5.4) q21d     10/04/2023 -  Chemotherapy   Patient is on Treatment Plan : BREAST Trastuzumab  (8/6) IV D1 + Capecitabine  +  Tucatinib  q21d       CURRENT THERAPY: Herceptin , Capecitabine , Tucatinib   INTERVAL HISTORY:  Lori Mcmillan 63 y.o. female returns for follow-up prior to receiving Herceptin .  She is feeling quite well today.  She denies any current issues.  She has not yet started capecitabine  and Tucatinib  because she was awaiting Voltaren  gel, which she received.  She denies any new issues and is ready to proceed with treatment today.   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) 02/07/2023   Cancer with pulmonary metastases (HCC) 02/07/2023   Cellulitis of finger of right hand 05/12/2022   Eustachian tube dysfunction, right 08/09/2021   Port-A-Cath in place 07/21/2021   Genetic testing 07/02/2021   Hx of basal cell carcinoma 05/26/2021   Family history of breast cancer  05/26/2021   Family history of ovarian cancer 05/26/2021   Family history of pancreatic cancer 05/26/2021   Family history of colon cancer 05/26/2021   Family history of melanoma 05/26/2021   Breast lump in upper inner quadrant 04/30/2021   Post herpetic neuralgia 01/29/2021   Seizures (HCC) 01/01/2021   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  No sign of residual dz at 04/2018 rad onc f/u and 10/2018 neuro-onc f/u. 03/2019 MRI brain->no resid/no recurrence.   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   Chicken pox    Depression    zoloft  in past--?wt gain.   Diplopia    chronic (meningioma-related)   Eustachian tube dysfunction 09/05/2013   Family history of breast cancer    Family history of colon cancer    Family history of melanoma  Family history of ovarian cancer    Family history of pancreatic cancer    GERD (gastroesophageal reflux disease)    History of radiation therapy 02/24/12-04/11/12   right breast/ 45Gy@1 .8Gyx11fx/boost=16Gy@2  Gya11fx.  Latest mammo and u/s 03/2013--normal.   History of radiation therapy 11/30/17- 01/11/18   Right temporal lobe treated to 55.8 Gy with 31 fx of 1.8 Gy   Hx of basal cell carcinoma    Migraine    Palpitations    has taken Metoprolol for palpitations in the past.   Seizure (HCC)    2 YEARS AGO,UPDATED 09/07/22   Taste impairment    s/p radiation therapy   Tobacco dependence 09/05/2013    SURGICAL HISTORY: Past Surgical History:  Procedure Laterality Date   APPENDECTOMY   2008   emergency   BRAIN TUMOR EXCISION  2018   Meningioma   BREAST LUMPECTOMY Right 01/04/2012   right breast   BREAST LUMPECTOMY WITH RADIOACTIVE SEED AND SENTINEL LYMPH NODE BIOPSY Right 06/18/2021   Procedure: RIGHT BREAST LUMPECTOMY WITH RADIOACTIVE SEED AND SENTINEL LYMPH NODE BIOPSY;  Surgeon: Vanderbilt Ned, MD;  Location: [facility];  Service: General;  Laterality: Right;   COLONOSCOPY     2014 ?   CRANIECTOMY  10/19/2017   at [facility] hospital   IR IMAGING GUIDED PORT INSERTION  02/02/2023   PORTACATH PLACEMENT Right 06/18/2021   Procedure: INSERTION PORT-A-CATH;  Surgeon: Vanderbilt Ned, MD;  Location:  SURGERY CENTER;  Service: General;  Laterality: Right;   PORTACATH REMOVED     OCTOBER 10,2023   WISDOM TOOTH EXTRACTION  1990    SOCIAL HISTORY: Social History   Socioeconomic History   Marital status: Single    Spouse name: Not on file   Number of children: Not on file   Years of education: Not on file   Highest education level: Bachelor's degree (e.g., BA, AB, BS)  Occupational History   Not on file  Tobacco Use   Smoking status: Former    Current packs/day: 0.00    Types: Cigarettes    Quit date: 10/17/2017    Years since quitting: 6.0    Passive exposure: Never   Smokeless tobacco: Never  Vaping Use   Vaping status: Former  Substance and Sexual Activity   Alcohol use: Yes    Alcohol/week: 3.0 standard drinks of alcohol    Types: 3 Cans of beer per week    Comment: 3-4 beers/day   Drug use: Not Currently   Sexual activity: Not Currently    Partners: Male  Other Topics Concern   Not on file  Social History Narrative   Marital status/children/pets: Single.  No children.  Lives alone.  Has pets.Orig from [place] in [place].   Education/employment: Bachelor's degree, retired   Field Seismologist:      -smoke alarm in the home:Yes     - wears seatbelt: Yes               Social Drivers of Health   Financial Resource Strain: Medium Risk  (05/23/2023)   Overall Financial Resource Strain (CARDIA)    Difficulty of Paying Living Expenses: Somewhat hard  Food Insecurity: Patient Declined (05/23/2023)   Hunger Vital Sign    Worried About Running Out of Food in the Last Year: Patient declined    Ran Out of Food in the Last Year: Patient declined  Transportation Needs: No Transportation Needs (05/23/2023)   PRAPARE - Administrator, Civil Service (Medical): No  Lack of Transportation (Non-Medical): No  Physical Activity: Insufficiently Active (05/23/2023)   Exercise Vital Sign    Days of Exercise per Week: 6 days    Minutes of Exercise per Session: 20 min  Stress: Stress Concern Present (05/23/2023)   [employer] of Occupational Health - Occupational Stress Questionnaire    Feeling of Stress : To some extent  Social Connections: Unknown (05/23/2023)   Social Connection and Isolation Panel [NHANES]    Frequency of Communication with Friends and Family: More than three times a week    Frequency of Social Gatherings with Friends and Family: Patient declined    Attends Religious Services: Patient declined    Database Administrator or Organizations: No    Attends Engineer, Structural: Not on file    Marital Status: Patient declined  Intimate Partner Violence: Unknown (01/20/2022)   Received from [employer], [facility]   HITS    Physically Hurt: Not on file    Insult or Talk Down To: Not on file    Threaten Physical Harm: Not on file    Scream or Curse: Not on file    FAMILY HISTORY: Family History  Problem Relation Age of Onset   Anesthesia problems Mother    Breast cancer Mother 24       lumpectomy, chemo, radiation   Colon polyps Brother    Ovarian cancer Maternal Aunt 52   Cancer Maternal Aunt        unknown type   Breast cancer Maternal Aunt 80   Breast cancer Maternal Aunt 40       bilateral   Pancreatic cancer Maternal Uncle        dx 62s   Stomach cancer Paternal Uncle    Cancer  Paternal Uncle        unknown type   Lung cancer Paternal Uncle        hx smoking   Dementia Maternal Grandmother    Lung cancer Maternal Grandfather        hx smoking   Pancreatic cancer Paternal Grandmother 71   Melanoma Paternal Grandmother        dx >50, shin   Heart Problems Paternal Grandfather    Colon cancer Cousin 38       maternal first cousin   Breast cancer Cousin        dx 90s, paternal first cousin   Crohn's disease Neg Hx    Esophageal cancer Neg Hx    Rectal cancer Neg Hx    Ulcerative colitis Neg Hx     Review of Systems  Constitutional:  Negative for appetite change, chills, fatigue, fever and unexpected weight change.  HENT:   Negative for hearing loss, lump/mass and trouble swallowing.   Eyes:  Negative for eye problems and icterus.  Respiratory:  Negative for chest tightness, cough and shortness of breath.   Cardiovascular:  Negative for chest pain, leg swelling and palpitations.  Gastrointestinal:  Negative for abdominal distention, abdominal pain, constipation, diarrhea, nausea and vomiting.  Endocrine: Negative for hot flashes.  Genitourinary:  Negative for difficulty urinating.   Musculoskeletal:  Negative for arthralgias.  Skin:  Negative for itching and rash.  Neurological:  Negative for dizziness, extremity weakness, headaches and numbness.  Hematological:  Negative for adenopathy. Does not bruise/bleed easily.  Psychiatric/Behavioral:  Negative for depression. The patient is not nervous/anxious.       PHYSICAL EXAMINATION    Vitals:   10/26/23 0835  BP: 115/64  Pulse:  80  Resp: 17  Temp: 97.9 F (36.6 C)  SpO2: 99%    Physical Exam Constitutional:      General: She is not in acute distress.    Appearance: Normal appearance. She is not toxic-appearing.  HENT:     Head: Normocephalic and atraumatic.     Mouth/Throat:     Mouth: Mucous membranes are moist.     Pharynx: Oropharynx is clear. No oropharyngeal exudate or posterior  oropharyngeal erythema.  Eyes:     General: No scleral icterus. Cardiovascular:     Rate and Rhythm: Normal rate and regular rhythm.     Pulses: Normal pulses.     Heart sounds: Normal heart sounds.  Pulmonary:     Effort: Pulmonary effort is normal.     Breath sounds: Normal breath sounds.  Abdominal:     General: Abdomen is flat. Bowel sounds are normal. There is no distension.     Palpations: Abdomen is soft.     Tenderness: There is no abdominal tenderness.  Musculoskeletal:        General: No swelling.     Cervical back: Neck supple.  Lymphadenopathy:     Cervical: No cervical adenopathy.  Skin:    General: Skin is warm and dry.     Findings: No rash.  Neurological:     General: No focal deficit present.     Mental Status: She is alert.  Psychiatric:        Mood and Affect: Mood normal.        Behavior: Behavior normal.     LABORATORY DATA:  CBC    Component Value Date/Time   WBC 5.4 10/26/2023 0802   WBC 7.0 01/13/2023 1200   RBC 3.81 (L) 10/26/2023 0802   HGB 11.9 (L) 10/26/2023 0802   HGB 11.8 12/08/2011 0819   HCT 36.1 10/26/2023 0802   HCT 34.9 12/08/2011 0819   PLT 163 10/26/2023 0802   PLT 314 12/08/2011 0819   MCV 94.8 10/26/2023 0802   MCV 88.3 12/08/2011 0819   MCH 31.2 10/26/2023 0802   MCHC 33.0 10/26/2023 0802   RDW 13.4 10/26/2023 0802   RDW 14.0 12/08/2011 0819   LYMPHSABS 1.5 10/26/2023 0802   LYMPHSABS 2.2 12/08/2011 0819   MONOABS 0.6 10/26/2023 0802   MONOABS 0.8 12/08/2011 0819   EOSABS 0.2 10/26/2023 0802   EOSABS 0.2 12/08/2011 0819   BASOSABS 0.0 10/26/2023 0802   BASOSABS 0.1 12/08/2011 0819    CMP     Component Value Date/Time   NA 139 10/26/2023 0802   NA 140 11/23/2019 0000   K 4.1 10/26/2023 0802   CL 107 10/26/2023 0802   CO2 26 10/26/2023 0802   GLUCOSE 76 10/26/2023 0802   BUN 7 10/26/2023 0802   BUN 9 11/23/2019 0000   CREATININE 0.60 10/26/2023 0802   CREATININE 0.64 09/18/2013 0900   CALCIUM 9.0  10/26/2023 0802   PROT 6.5 10/26/2023 0802   ALBUMIN 3.9 10/26/2023 0802   AST 43 (H) 10/26/2023 0802   ALT 39 10/26/2023 0802   ALKPHOS 155 (H) 10/26/2023 0802   BILITOT 0.3 10/26/2023 0802   GFRNONAA >60 10/26/2023 0802   GFRAA 113 11/23/2019 0000        ASSESSMENT and THERAPY PLAN:   Malignant neoplasm of central portion of right breast in female, estrogen receptor negative (HCC) 06/18/2021:Right lumpectomy: Grade 3 IDC 1.5 cm, high-grade DCIS, margins negative, 0/2 lymph nodes negative, ER 0%, PR 0%, HER2 3+ positive, Ki-67  30%    (2013 Right breast cancer: Stage Ia ER/PR positive HER2 negative status postlumpectomy, radiation (low risk Oncotype) could not tolerate tamoxifen  for more than 30 days.)   Treatment plan: 1.  Adjuvant chemotherapy with Taxol  and Herceptin  followed by Herceptin  maintenance completed 07/06/2022 2. breast radiation completed 01/04/2022 3.  Metastatic disease diagnosis: --------------------------------------------------------------------------------------------------------------------- CT chest on 11/01/2022: Right upper lobe nodules largest measuring 5 mm CT chest 12/29/2022: Upper and mid lung zone predominant pulmonary nodules are worrisome for metastatic disease.  Left and right hepatic lobe masses (2.5 cm and 3.9 cm) are new and are worrisome for metastatic disease. PET CT scan 01/17/2023: Hypermetabolic bilobar hepatic (2 masses measuring 2.8 cm SUV 11.1, another mass 4.1 cm SUV 10.3) and pulmonary metastases (right upper lobe lung nodule 5 mm SUV 2.6 right lower lobe 6 mm nodule SUV 1.8)  01/13/23: Liver Biopsy: Met breast cancer ER 0%, PR 0%, Ki67 50%, HER2 3+  CT CAP 04/15/2023: Response to treatment in the lung and liver metastases.  (5 mm nodule is 3 mm 6 mm nodule is 2 to 3 mm, right liver subcapsular lesion is 1.7 cm and it used to be 3.5 cm, segment 2 liver lesion 1.3 cm it used to be 2.8 cm) CT CAP 07/05/2023: Decrease in size of liver mets 0.2 cm was  1.7 cm, 1.1 cm was previously 1.3 cm.  Stable small lung nodules. PET/CT 09/30/2023: Progression of liver metastasis (3.4 cm was 1.7 cm, 2.3 cm was 1.2 cm, additional liver lesions 1.2 cm and 1.3 cm), lung nodule 0.9 cm was 0.6 cm   Current treatment: Herceptin , Tucatinib , Capecitabine  Chemo toxicities: Reviewed labs with her in detail. Recent echo shows preserved EF in 08/2023--repeat echo ordered for February.   Proceed with Herceptin  today, to start tucatinib , capecitabine  orally tomorrow.  She received the voltaren  gel to use on her hand and feet as part of research trial. Recommended against curcurmin, however will review with our pharmacist, Norleen Parkinson.    RTC in 3 weeks for labs, f/u, and treatment.    All questions were answered. The patient knows to call the clinic with any problems, questions or concerns. We can certainly see the patient much sooner if necessary.  Total encounter time:20 minutes*in face-to-face visit time, chart review, lab review, care coordination, order entry, and documentation of the encounter time.    Morna Kendall, NP 10/26/23 9:16 AM Medical Oncology and Hematology [facility] [address] [place], [place] [address] Tel. [phone]    Fax. [phone]  *Total Encounter Time as defined by the Centers for Medicare and Medicaid Services includes, in addition to the face-to-face time of a patient visit (documented in the note above) non-face-to-face time: obtaining and reviewing outside history, ordering and reviewing medications, tests or procedures, care coordination (communications with other health care professionals or caregivers) and documentation in the medical record.

## 2023-10-29 LAB — CANCER ANTIGEN 27.29: CA 27.29: <9 U/mL (ref 0.0–38.6)

## 2023-10-31 ENCOUNTER — Ambulatory Visit: Payer: 59

## 2023-11-01 ENCOUNTER — Other Ambulatory Visit: Payer: Self-pay

## 2023-11-01 ENCOUNTER — Ambulatory Visit: Payer: 59

## 2023-11-07 ENCOUNTER — Other Ambulatory Visit: Payer: Self-pay

## 2023-11-07 NOTE — Progress Notes (Signed)
Specialty Pharmacy Refill Coordination Note  Lori Mcmillan is a 63 y.o. female contacted today regarding refills of specialty medication(s) Capecitabine (XELODA); Tucatinib [name])   Patient requested Pickup at [facility] [place] date: 11/10/23   Medication will be filled on 11/09/23.

## 2023-11-07 NOTE — Progress Notes (Signed)
Specialty Pharmacy Ongoing Clinical Assessment Note  Lori Mcmillan is a 63 y.o. female who is being followed by the specialty pharmacy service for RxSp Oncology   Patient's specialty medication(s) reviewed today: Capecitabine (XELODA); Tucatinib (TUKYSA)   Missed doses in the last 4 weeks: 0   Patient/Caregiver did not have any additional questions or concerns.   Therapeutic benefit summary: Unable to assess   Adverse events/side effects summary: No adverse events/side effects   Patient's therapy is appropriate to: Continue    Goals Addressed             This Visit's Progress    Stabilization of disease       Patient is on track. Patient will be evaluated at upcoming provider appointment to assess progress         Follow up:  3 months  Bobette Mo Specialty Pharmacist

## 2023-11-08 ENCOUNTER — Encounter: Payer: Self-pay | Admitting: Hematology and Oncology

## 2023-11-09 ENCOUNTER — Other Ambulatory Visit: Payer: Self-pay

## 2023-11-10 ENCOUNTER — Ambulatory Visit: Payer: 59 | Admitting: Family Medicine

## 2023-11-10 DIAGNOSIS — Z23 Encounter for immunization: Secondary | ICD-10-CM

## 2023-11-10 NOTE — Progress Notes (Signed)
Medical screening examination/treatment was performed by qualified clinical staff member and as supervising physician I was immediately available for consultation/collaboration. I have reviewed documentation and agree with assessment and plan.   , DO  

## 2023-11-10 NOTE — Progress Notes (Signed)
HPI  Pt here to have 2nd dose of Shingles.                        Assessment and Plan:  Pt given 2nd shingles  in LD. Tolerated well no reaction noted. No follow up on file.

## 2023-11-15 ENCOUNTER — Inpatient Hospital Stay (HOSPITAL_BASED_OUTPATIENT_CLINIC_OR_DEPARTMENT_OTHER): Payer: 59 | Admitting: Hematology and Oncology

## 2023-11-15 ENCOUNTER — Inpatient Hospital Stay: Payer: 59

## 2023-11-15 VITALS — BP 118/71 | HR 84 | Temp 97.9°F | Resp 17 | Ht 63.0 in | Wt 133.9 lb

## 2023-11-15 DIAGNOSIS — Z171 Estrogen receptor negative status [ER-]: Secondary | ICD-10-CM | POA: Diagnosis not present

## 2023-11-15 DIAGNOSIS — C7800 Secondary malignant neoplasm of unspecified lung: Secondary | ICD-10-CM | POA: Diagnosis not present

## 2023-11-15 DIAGNOSIS — Z87891 Personal history of nicotine dependence: Secondary | ICD-10-CM | POA: Diagnosis not present

## 2023-11-15 DIAGNOSIS — Z1731 Human epidermal growth factor receptor 2 positive status: Secondary | ICD-10-CM | POA: Diagnosis not present

## 2023-11-15 DIAGNOSIS — C50111 Malignant neoplasm of central portion of right female breast: Secondary | ICD-10-CM

## 2023-11-15 DIAGNOSIS — Z923 Personal history of irradiation: Secondary | ICD-10-CM | POA: Diagnosis not present

## 2023-11-15 DIAGNOSIS — Z95828 Presence of other vascular implants and grafts: Secondary | ICD-10-CM

## 2023-11-15 DIAGNOSIS — C787 Secondary malignant neoplasm of liver and intrahepatic bile duct: Secondary | ICD-10-CM | POA: Diagnosis not present

## 2023-11-15 DIAGNOSIS — Z1722 Progesterone receptor negative status: Secondary | ICD-10-CM | POA: Diagnosis not present

## 2023-11-15 DIAGNOSIS — Z5112 Encounter for antineoplastic immunotherapy: Secondary | ICD-10-CM | POA: Diagnosis not present

## 2023-11-15 LAB — CBC WITH DIFFERENTIAL (CANCER CENTER ONLY)
Abs Immature Granulocytes: 0.01 10*3/uL (ref 0.00–0.07)
Basophils Absolute: 0 10*3/uL (ref 0.0–0.1)
Basophils Relative: 1 %
Eosinophils Absolute: 0.2 10*3/uL (ref 0.0–0.5)
Eosinophils Relative: 4 %
HCT: 36.8 % (ref 36.0–46.0)
Hemoglobin: 12.1 g/dL (ref 12.0–15.0)
Immature Granulocytes: 0 %
Lymphocytes Relative: 24 %
Lymphs Abs: 1.4 10*3/uL (ref 0.7–4.0)
MCH: 31.4 pg (ref 26.0–34.0)
MCHC: 32.9 g/dL (ref 30.0–36.0)
MCV: 95.6 fL (ref 80.0–100.0)
Monocytes Absolute: 0.5 10*3/uL (ref 0.1–1.0)
Monocytes Relative: 9 %
Neutro Abs: 3.6 10*3/uL (ref 1.7–7.7)
Neutrophils Relative %: 62 %
Platelet Count: 168 10*3/uL (ref 150–400)
RBC: 3.85 MIL/uL — ABNORMAL LOW (ref 3.87–5.11)
RDW: 14.4 % (ref 11.5–15.5)
WBC Count: 5.7 10*3/uL (ref 4.0–10.5)
nRBC: 0 % (ref 0.0–0.2)

## 2023-11-15 LAB — CMP (CANCER CENTER ONLY)
ALT: 41 U/L (ref 0–44)
AST: 39 U/L (ref 15–41)
Albumin: 4 g/dL (ref 3.5–5.0)
Alkaline Phosphatase: 125 U/L (ref 38–126)
Anion gap: 7 (ref 5–15)
BUN: 14 mg/dL (ref 6–20)
CO2: 27 mmol/L (ref 22–32)
Calcium: 9.1 mg/dL (ref 8.9–10.3)
Chloride: 106 mmol/L (ref 98–111)
Creatinine: 0.86 mg/dL (ref 0.44–1.00)
GFR, Estimated: >60 mL/min (ref >60)
Glucose, Bld: 98 mg/dL (ref 70–99)
Potassium: 3.6 mmol/L (ref 3.5–5.1)
Sodium: 140 mmol/L (ref 135–145)
Total Bilirubin: 0.4 mg/dL (ref 0.0–1.2)
Total Protein: 6.6 g/dL (ref 6.5–8.1)

## 2023-11-15 MED ORDER — ACETAMINOPHEN 325 MG PO TABS
650.0000 mg | ORAL_TABLET | Freq: Once | ORAL | Status: AC
  Administered 2023-11-15: 650 mg via ORAL
  Filled 2023-11-15: qty 2

## 2023-11-15 MED ORDER — SODIUM CHLORIDE 0.9 % IV SOLN: INTRAVENOUS | Status: DC

## 2023-11-15 MED ORDER — TRASTUZUMAB-ANNS CHEMO 150 MG IV SOLR
6.0000 mg/kg | Freq: Once | INTRAVENOUS | Status: AC
  Administered 2023-11-15: 378 mg via INTRAVENOUS
  Filled 2023-11-15: qty 18

## 2023-11-15 MED ORDER — SODIUM CHLORIDE 0.9% FLUSH
10.0000 mL | Freq: Once | INTRAVENOUS | Status: AC
  Administered 2023-11-15: 10 mL

## 2023-11-15 MED ORDER — DIPHENHYDRAMINE HCL 25 MG PO CAPS
25.0000 mg | ORAL_CAPSULE | Freq: Once | ORAL | Status: AC
  Administered 2023-11-15: 25 mg via ORAL
  Filled 2023-11-15: qty 1

## 2023-11-15 MED ORDER — HEPARIN SOD (PORK) LOCK FLUSH 100 UNIT/ML IV SOLN
500.0000 [IU] | Freq: Once | INTRAVENOUS | Status: AC | PRN
  Administered 2023-11-15: 500 [IU]

## 2023-11-15 MED ORDER — SODIUM CHLORIDE 0.9% FLUSH
10.0000 mL | INTRAVENOUS | Status: DC | PRN
Start: 2023-11-15 — End: 2023-11-15
  Administered 2023-11-15: 10 mL

## 2023-11-15 NOTE — Progress Notes (Signed)
Patient Care Team: Everrett Coombe, DO as PCP - General (Family Medicine) [name], MD as Consulting Physician (Radiation Oncology) Barbaraann Cao Georgeanna Lea, MD as Consulting Physician (Oncology) [facility] [place] ([facility]) Domenic Schwab, Lucianne Lei, MD as Referring Physician (Dermatology) Graylin Shiver, MD as Referring Physician (Otolaryngology) Serena Croissant, MD as Medical Oncologist (Hematology and Oncology) Harriette Bouillon, MD as Consulting Physician (General Surgery)  DIAGNOSIS:  Encounter Diagnoses  Name Primary?   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 No pathogenic variants identified on the [facility] Multi-Cancer Panel+RNA. VUS in POLD1 identified called c.2429C>T. The report date  is 07/01/2021.  The Multi-Cancer Panel + RNA offered by [facility] includes sequencing and/or deletion duplication testing of the following 84 genes: AIP, ALK, APC, ATM, AXIN2,BAP1,  BARD1, BLM, BMPR1A, BRCA1, BRCA2, BRIP1, CASR, CDC73, CDH1, CDK4, CDKN1B, CDKN1C, CDKN2A (p14ARF), CDKN2A (p16INK4a), CEBPA, CHEK2, CTNNA1, DICER1, DIS3L2, EGFR (c.2369C>T, p.Thr790Met variant only), EPCAM (Deletion/duplication testing only), FH, FLCN, GATA2, GPC3, GREM1 (Promoter region deletion/duplication testing only), HOXB13 (c.251G>A, p.Gly84Glu), HRAS, KIT, MAX, MEN1, MET, MITF (c.952G>A, p.Glu318Lys variant only), MLH1, MSH2, MSH3, MSH6, MUTYH, NBN, NF1, NF2, NTHL1, PALB2, PDGFRA, PHOX2B, PMS2, POLD1, POLE, POT1, PRKAR1A, PTCH1, PTEN, RAD50, RAD51C, RAD51D, RB1, RECQL4, RET, RUNX1, SDHAF2, SDHA (sequence changes only), SDHB, SDHC, SDHD, SMAD4, SMARCA4, SMARCB1, SMARCE1, STK11, SUFU, TERC, TERT, TMEM127, TP53, TSC1, TSC2, VHL, WRN and WT1.   06/18/2021 Cancer Staging   Staging form: Breast, AJCC 8th Edition - Pathologic stage from 06/18/2021: Stage IA (pT1c, pN0, cM0, G3, ER-, PR-, HER2+) - Signed by [name], NP on 10/05/2022 Stage prefix: Initial diagnosis Histologic grading system: 3 grade system   07/21/2021 - 07/06/2022 Chemotherapy   Patient is on Treatment Plan : BREAST Paclitaxel + Trastuzumab q7d / Trastuzumab q21d     11/25/2021 - 01/04/2022 Radiation Therapy   Site Technique Total Dose (Gy) Dose per Fx (Gy) Completed Fx Beam Energies  Breast, Right: Breast_R 3D 45/45 1.8 25/25 6XFFF  Breast, Right: Breast_R_Bst specialPort 5.4/5.4 1.8 3/3 12E     12/29/2022 Progression   CT chest on 11/01/2022: Right upper lobe nodules largest measuring 5 mm CT chest 12/29/2022: Upper and mid lung zone predominant pulmonary nodules are worrisome for metastatic disease.  Left and right hepatic lobe masses (2.5 cm and 3.9  cm) are new and are worrisome for metastatic disease.   01/13/2023 Initial Biopsy   Right  liver biopsy: Metastatic carcinoma with breast primary, ER-, PR-, HER2 +, Ki-67 50%.    01/13/2023 Cancer Staging   Staging form: Breast, AJCC 8th Edition - Pathologic stage from 01/13/2023: Stage IV (pM1, G3, ER-, PR-, HER2+) - Signed by [name], NP on 02/07/2023 Histologic grading system: 3 grade system   01/17/2023 PET scan   PET CT scan 01/17/2023: Hypermetabolic bilobar hepatic (2 masses measuring 2.8 cm SUV 11.1, another mass 4.1 cm SUV 10.3) and pulmonary metastases (right upper lobe lung nodule 5 mm SUV 2.6 right lower lobe 6 mm nodule SUV 1.8)    02/07/2023 - 09/13/2023 Chemotherapy   Patient is on Treatment Plan : BREAST METASTATIC Fam-Trastuzumab Deruxtecan-nxki (Enhertu) (5.4) q21d     10/04/2023 -  Chemotherapy   Patient is on Treatment Plan : BREAST Trastuzumab (8/6) IV D1 + Capecitabine +  Tucatinib q21d       CHIEF COMPLIANT: Follow-up on Xeloda Tucatinib and Herceptin  HISTORY OF PRESENT ILLNESS:  History of Present Illness   The patient presents with a rash around the mouth and recent gastrointestinal symptoms.  The patient has developed a rash around her mouth, described as blanching and resembling 'spider nevi'. There are no similar symptoms inside her mouth, and the rash does not cause itching or discomfort. She has been using Aquaphor as a moisturizer. She recalls a previous reaction to a topical treatment that caused skin irritation, which she discontinued after two uses.  She experienced gastrointestinal symptoms after consuming a glass of wine, including diarrhea that required two Imodium tablets. At the time, she had been off Xeloda for four days and Herceptin for almost three weeks. She attributes the symptoms to the wine, as it was the only change in her routine. No nausea was reported.  Her current medications include Tukysa daily, and she has been off Xeloda since last Wednesday, planning to resume on the thirtieth. She maintains a diet consisting  of sweet potatoes, pickled beets, cranberries, acai berry juice, and blueberries, which she believes helps her tolerate her medications well.         ALLERGIES:  has no known allergies.  MEDICATIONS:  Current Outpatient Medications  Medication Sig Dispense Refill   acetaminophen (TYLENOL) 500 MG tablet Take 500 mg by mouth as needed.     ALPRAZolam (XANAX) 0.25 MG tablet TAKE 1 TABLET BY MOUTH TWICE A DAY AS NEEDED FOR ANXIETY 30 tablet 3   capecitabine (XELODA) 500 MG tablet Take 2 tablets (1,000 mg total) by mouth 2 (two) times daily before a meal. Take for 14 days on, 7 days off. Repeat every 21 days. 56 tablet 6   diazepam (VALIUM) 2 MG tablet Take by mouth as needed (dizziness).     diclofenac Sodium (VOLTAREN) 1 % GEL Research Patient: Apply 0.5 grams (1 fingertip) to each hand and each foot twice daily for up to 12 weeks 400 g 0   Famotidine-Ca Carb-Mag Hydrox (PEPCID COMPLETE PO) Take by mouth as needed.     lidocaine-prilocaine (EMLA) cream Apply to affected area once 30 g 3   ondansetron (ZOFRAN) 8 MG tablet Take 1 tablet (8 mg total) by mouth every 8 (eight) hours as needed for nausea or vomiting. 30 tablet 1   prochlorperazine (COMPAZINE) 10 MG tablet Take 1 tablet (10 mg total) by mouth every 6 (six) hours as needed for nausea or vomiting. 30 tablet  1   tucatinib (TUKYSA) 150 MG tablet Take 2 tablets (300 mg total) by mouth 2 (two) times daily. Take every 12 hrs at the same time each day with or without a meal. 120 tablet 3   No current facility-administered medications for this visit.    PHYSICAL EXAMINATION: ECOG PERFORMANCE STATUS: 1 - Symptomatic but completely ambulatory  Vitals:   11/15/23 0857  BP: 118/71  Pulse: 84  Resp: 17  Temp: 97.9 F (36.6 C)  SpO2: 97%   Filed Weights   11/15/23 0857  Weight: 133 lb 14.4 oz (60.7 kg)    Physical Exam   SKIN: Spider nevi around the mouth. Rash on hands and chest.      (exam performed in the presence of a  chaperone)  LABORATORY DATA:  I have reviewed the data as listed    Latest Ref Rng & Units 11/15/2023    8:10 AM 10/26/2023    8:02 AM 10/04/2023    8:24 AM  CMP  Glucose 70 - 99 mg/dL 98  76  81   BUN 6 - 20 mg/dL 14  7  6    Creatinine 0.44 - 1.00 mg/dL 1.61  0.96  0.45   Sodium 135 - 145 mmol/L 140  139  139   Potassium 3.5 - 5.1 mmol/L 3.6  4.1  4.0   Chloride 98 - 111 mmol/L 106  107  107   CO2 22 - 32 mmol/L 27  26  26    Calcium 8.9 - 10.3 mg/dL 9.1  9.0  8.8   Total Protein 6.5 - 8.1 g/dL 6.6  6.5  5.9   Total Bilirubin 0.0 - 1.2 mg/dL 0.4  0.3  0.3   Alkaline Phos 38 - 126 U/L 125  155  123   AST 15 - 41 U/L 39  43  39   ALT 0 - 44 U/L 41  39  34     Lab Results  Component Value Date   WBC 5.7 11/15/2023   HGB 12.1 11/15/2023   HCT 36.8 11/15/2023   MCV 95.6 11/15/2023   PLT 168 11/15/2023   NEUTROABS 3.6 11/15/2023    ASSESSMENT & PLAN:  Malignant neoplasm of central portion of right breast in female, estrogen receptor negative (HCC) 06/18/2021:Right lumpectomy: 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  Adjuvant chemotherapy with Taxol and Herceptin followed by Herceptin maintenance completed 07/06/2022 2. breast radiation completed 01/04/2022 3.  Metastatic disease diagnosis:CT chest on 11/01/2022: Right upper lobe nodules largest measuring 5 mm, 01/13/23: Liver Biopsy: Met breast cancer ER 0%, PR 0%, Ki67 50%, HER2 3+  4.  Enhertu 02/07/2023-09/13/2023 --------------------------------------------------------------------------------------------------------------------- PET/CT 09/30/2023: Progression of liver metastasis (3.4 cm was 1.7 cm, 2.3 cm was 1.2 cm, additional liver lesions 1.2 cm and 1.3  cm), lung nodule 0.9 cm was 0.6 cm  Current treatment: Herceptin, Tucatinib, capecitabine started December 2024 Chemo toxicities: Slight rash on the hands left send chest: Could be fixed drug eruption from one of the treatments.  It is fairly mild and she will use moisturizers for now. Voltaren gel causes burning of her hands therefore she will discontinue it. Reviewed labs with her in detail.  RTC in 3 weeks for labs,  f/u, and treatment.  Next echo scheduled for 11/25/2023  No orders of the defined types were placed in this encounter.  The patient has a good understanding of the overall plan. she agrees with it. she will call with any problems that may develop before the next visit here. Total time spent: 30 mins including face to face time and time spent for planning, charting and co-ordination of care   Tamsen Meek, MD 11/15/23

## 2023-11-15 NOTE — Assessment & Plan Note (Signed)
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  Adjuvant chemotherapy with Taxol and Herceptin followed by Herceptin maintenance completed 07/06/2022 2. breast radiation completed 01/04/2022 3.  Metastatic disease diagnosis:CT chest on 11/01/2022: Right upper lobe nodules largest measuring 5 mm, 01/13/23: Liver Biopsy: Met breast cancer ER 0%, PR 0%, Ki67 50%, HER2 3+  4.  Enhertu 02/07/2023-09/13/2023 --------------------------------------------------------------------------------------------------------------------- PET/CT 09/30/2023: Progression of liver metastasis (3.4 cm was 1.7 cm, 2.3 cm was 1.2 cm, additional liver lesions 1.2 cm and 1.3 cm), lung nodule 0.9 cm was 0.6 cm  Current treatment: Herceptin, Tucatinib, capecitabine Chemo toxicities:  Reviewed labs with her in detail.  RTC in 3 weeks for labs, f/u, and treatment.  Next echo scheduled for 11/25/2023

## 2023-11-16 LAB — CANCER ANTIGEN 27.29: CA 27.29: 10.2 U/mL (ref 0.0–38.6)

## 2023-11-17 ENCOUNTER — Encounter: Payer: Self-pay | Admitting: *Deleted

## 2023-11-17 ENCOUNTER — Other Ambulatory Visit: Payer: Self-pay | Admitting: *Deleted

## 2023-11-17 ENCOUNTER — Other Ambulatory Visit: Payer: Self-pay | Admitting: Pharmacist

## 2023-11-17 ENCOUNTER — Encounter: Payer: Self-pay | Admitting: Hematology and Oncology

## 2023-11-17 MED ORDER — METHYLPREDNISOLONE 4 MG PO TBPK: ORAL_TABLET | ORAL | 0 refills | Status: DC

## 2023-11-17 NOTE — Progress Notes (Signed)
Received message from pt stating she is experiencing a rash on her torso, bilateral arms and face.  Verbal orders received from MD for pt to be prescribed medrol dosepak .MD also requesting Nathaniel Man be changed to brand name due to pt reaction to inactive ingredient in [place].  Pharmacy notified as well as PA for change.  Pt educated and verbalized understanding.

## 2023-11-19 ENCOUNTER — Encounter: Payer: Self-pay | Admitting: Hematology and Oncology

## 2023-11-25 ENCOUNTER — Ambulatory Visit (HOSPITAL_COMMUNITY)
Admission: RE | Admit: 2023-11-25 | Discharge: 2023-11-25 | Disposition: A | Discharge status: Home / Self Care | Payer: 59 | Source: Ambulatory Visit | Attending: Adult Health | Admitting: Adult Health

## 2023-11-25 DIAGNOSIS — Z7969 Long term (current) use of other immunomodulators and immunosuppressants: Secondary | ICD-10-CM | POA: Insufficient documentation

## 2023-11-25 DIAGNOSIS — Z08 Encounter for follow-up examination after completed treatment for malignant neoplasm: Secondary | ICD-10-CM | POA: Insufficient documentation

## 2023-11-25 DIAGNOSIS — C799 Secondary malignant neoplasm of unspecified site: Secondary | ICD-10-CM | POA: Insufficient documentation

## 2023-11-25 DIAGNOSIS — Z171 Estrogen receptor negative status [ER-]: Secondary | ICD-10-CM | POA: Insufficient documentation

## 2023-11-25 DIAGNOSIS — Z0189 Encounter for other specified special examinations: Secondary | ICD-10-CM | POA: Diagnosis not present

## 2023-11-25 DIAGNOSIS — C50111 Malignant neoplasm of central portion of right female breast: Secondary | ICD-10-CM | POA: Insufficient documentation

## 2023-11-25 DIAGNOSIS — Z09 Encounter for follow-up examination after completed treatment for conditions other than malignant neoplasm: Secondary | ICD-10-CM

## 2023-11-25 LAB — ECHOCARDIOGRAM COMPLETE
AR max vel: 1.75 cm2
AV Area VTI: 2.07 cm2
AV Area mean vel: 1.49 cm2
AV Mean grad: 2 mm[Hg]
AV Peak grad: 4.8 mm[Hg]
Ao pk vel: 1.1 m/s
Area-P 1/2: 5.2 cm2
Calc EF: 54 %
MV VTI: 2.62 cm2
S' Lateral: 3.4 cm
Single Plane A2C EF: 53.3 %
Single Plane A4C EF: 55.9 %

## 2023-11-25 NOTE — Progress Notes (Signed)
  Echocardiogram 2D Echocardiogram has been performed.  Syriana Croslin L Rilynn Habel RDCS 11/25/2023, 9:48 AM

## 2023-11-29 ENCOUNTER — Other Ambulatory Visit: Payer: Self-pay | Admitting: Hematology and Oncology

## 2023-11-30 ENCOUNTER — Other Ambulatory Visit: Payer: Self-pay

## 2023-11-30 NOTE — Progress Notes (Signed)
Specialty Pharmacy Refill Coordination Note  Lori Mcmillan is a 63 y.o. female contacted today regarding refills of specialty medication(s) Capecitabine (XELODA); Tucatinib [name])   Patient requested Pickup at [facility] at [place] date: 12/06/23   Medication will be filled on 02.17.25.

## 2023-12-05 ENCOUNTER — Other Ambulatory Visit: Payer: Self-pay

## 2023-12-05 NOTE — Assessment & Plan Note (Signed)
 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  Adjuvant chemotherapy with Taxol and Herceptin followed by Herceptin maintenance completed 07/06/2022 2. breast radiation completed 01/04/2022 3.  Metastatic disease diagnosis:CT chest on 11/01/2022: Right upper lobe nodules largest measuring 5 mm, 01/13/23: Liver Biopsy: Met breast cancer ER 0%, PR 0%, Ki67 50%, HER2 3+  4.  Enhertu 02/07/2023-09/13/2023 --------------------------------------------------------------------------------------------------------------------- PET/CT 09/30/2023: Progression of liver metastasis (3.4 cm was 1.7 cm, 2.3 cm was 1.2 cm, additional liver lesions 1.2 cm and 1.3 cm), lung nodule 0.9 cm was 0.6 cm  Current treatment: Herceptin, Tucatinib, capecitabine started December 2024 Chemo toxicities: Slight rash on the hands left send chest: Could be fixed drug eruption from one of the treatments.  It is fairly mild and she will use moisturizers for now. Reviewed labs with her in detail.   RTC in 3 weeks for labs, f/u, and treatment.  ECHO 11/25/2023: EF 50-55%

## 2023-12-06 ENCOUNTER — Inpatient Hospital Stay: Payer: 59

## 2023-12-06 ENCOUNTER — Ambulatory Visit (HOSPITAL_COMMUNITY)
Admission: RE | Admit: 2023-12-06 | Discharge: 2023-12-06 | Disposition: A | Discharge status: Home / Self Care | Payer: 59 | Source: Ambulatory Visit | Attending: Hematology and Oncology | Admitting: Hematology and Oncology

## 2023-12-06 ENCOUNTER — Inpatient Hospital Stay: Payer: 59 | Attending: Hematology and Oncology | Admitting: Hematology and Oncology

## 2023-12-06 VITALS — BP 125/62 | HR 72 | Resp 16

## 2023-12-06 VITALS — BP 127/73 | HR 81 | Temp 98.8°F | Resp 18 | Ht 63.0 in | Wt 135.7 lb

## 2023-12-06 DIAGNOSIS — C50111 Malignant neoplasm of central portion of right female breast: Secondary | ICD-10-CM | POA: Diagnosis not present

## 2023-12-06 DIAGNOSIS — Z171 Estrogen receptor negative status [ER-]: Secondary | ICD-10-CM | POA: Diagnosis not present

## 2023-12-06 DIAGNOSIS — L270 Generalized skin eruption due to drugs and medicaments taken internally: Secondary | ICD-10-CM | POA: Insufficient documentation

## 2023-12-06 DIAGNOSIS — Z853 Personal history of malignant neoplasm of breast: Secondary | ICD-10-CM | POA: Insufficient documentation

## 2023-12-06 DIAGNOSIS — R42 Dizziness and giddiness: Secondary | ICD-10-CM | POA: Insufficient documentation

## 2023-12-06 DIAGNOSIS — T451X5A Adverse effect of antineoplastic and immunosuppressive drugs, initial encounter: Secondary | ICD-10-CM | POA: Diagnosis not present

## 2023-12-06 DIAGNOSIS — Z1722 Progesterone receptor negative status: Secondary | ICD-10-CM | POA: Diagnosis not present

## 2023-12-06 DIAGNOSIS — R911 Solitary pulmonary nodule: Secondary | ICD-10-CM | POA: Diagnosis not present

## 2023-12-06 DIAGNOSIS — Z5112 Encounter for antineoplastic immunotherapy: Secondary | ICD-10-CM | POA: Insufficient documentation

## 2023-12-06 DIAGNOSIS — C787 Secondary malignant neoplasm of liver and intrahepatic bile duct: Secondary | ICD-10-CM | POA: Diagnosis not present

## 2023-12-06 DIAGNOSIS — Z95828 Presence of other vascular implants and grafts: Secondary | ICD-10-CM

## 2023-12-06 DIAGNOSIS — R220 Localized swelling, mass and lump, head: Secondary | ICD-10-CM | POA: Diagnosis not present

## 2023-12-06 DIAGNOSIS — G9389 Other specified disorders of brain: Secondary | ICD-10-CM | POA: Diagnosis not present

## 2023-12-06 DIAGNOSIS — Z1731 Human epidermal growth factor receptor 2 positive status: Secondary | ICD-10-CM | POA: Diagnosis not present

## 2023-12-06 DIAGNOSIS — C50919 Malignant neoplasm of unspecified site of unspecified female breast: Secondary | ICD-10-CM | POA: Diagnosis not present

## 2023-12-06 DIAGNOSIS — Z923 Personal history of irradiation: Secondary | ICD-10-CM | POA: Insufficient documentation

## 2023-12-06 LAB — CBC WITH DIFFERENTIAL (CANCER CENTER ONLY)
Abs Immature Granulocytes: 0.01 10*3/uL (ref 0.00–0.07)
Basophils Absolute: 0 10*3/uL (ref 0.0–0.1)
Basophils Relative: 0 %
Eosinophils Absolute: 0.2 10*3/uL (ref 0.0–0.5)
Eosinophils Relative: 4 %
HCT: 34.4 % — ABNORMAL LOW (ref 36.0–46.0)
Hemoglobin: 11.6 g/dL — ABNORMAL LOW (ref 12.0–15.0)
Immature Granulocytes: 0 %
Lymphocytes Relative: 26 %
Lymphs Abs: 1.2 10*3/uL (ref 0.7–4.0)
MCH: 31.9 pg (ref 26.0–34.0)
MCHC: 33.7 g/dL (ref 30.0–36.0)
MCV: 94.5 fL (ref 80.0–100.0)
Monocytes Absolute: 0.5 10*3/uL (ref 0.1–1.0)
Monocytes Relative: 11 %
Neutro Abs: 2.8 10*3/uL (ref 1.7–7.7)
Neutrophils Relative %: 59 %
Platelet Count: 139 10*3/uL — ABNORMAL LOW (ref 150–400)
RBC: 3.64 MIL/uL — ABNORMAL LOW (ref 3.87–5.11)
RDW: 14.7 % (ref 11.5–15.5)
WBC Count: 4.6 10*3/uL (ref 4.0–10.5)
nRBC: 0 % (ref 0.0–0.2)

## 2023-12-06 LAB — CMP (CANCER CENTER ONLY)
ALT: 36 U/L (ref 0–44)
AST: 38 U/L (ref 15–41)
Albumin: 4.2 g/dL (ref 3.5–5.0)
Alkaline Phosphatase: 125 U/L (ref 38–126)
Anion gap: 6 (ref 5–15)
BUN: 9 mg/dL (ref 6–20)
CO2: 26 mmol/L (ref 22–32)
Calcium: 9 mg/dL (ref 8.9–10.3)
Chloride: 107 mmol/L (ref 98–111)
Creatinine: 0.77 mg/dL (ref 0.44–1.00)
GFR, Estimated: >60 mL/min (ref >60)
Glucose, Bld: 87 mg/dL (ref 70–99)
Potassium: 3.6 mmol/L (ref 3.5–5.1)
Sodium: 139 mmol/L (ref 135–145)
Total Bilirubin: 0.5 mg/dL (ref 0.0–1.2)
Total Protein: 6.4 g/dL — ABNORMAL LOW (ref 6.5–8.1)

## 2023-12-06 MED ORDER — DIPHENHYDRAMINE HCL 25 MG PO CAPS
25.0000 mg | ORAL_CAPSULE | Freq: Once | ORAL | Status: AC
  Administered 2023-12-06: 25 mg via ORAL
  Filled 2023-12-06: qty 1

## 2023-12-06 MED ORDER — SODIUM CHLORIDE 0.9% FLUSH
10.0000 mL | Freq: Once | INTRAVENOUS | Status: AC
  Administered 2023-12-06: 10 mL

## 2023-12-06 MED ORDER — HEPARIN SOD (PORK) LOCK FLUSH 100 UNIT/ML IV SOLN: 500.0000 [IU] | Freq: Once | INTRAVENOUS | Status: DC | PRN

## 2023-12-06 MED ORDER — ACETAMINOPHEN 325 MG PO TABS
650.0000 mg | ORAL_TABLET | Freq: Once | ORAL | Status: AC
  Administered 2023-12-06: 650 mg via ORAL
  Filled 2023-12-06: qty 2

## 2023-12-06 MED ORDER — SODIUM CHLORIDE 0.9 % IV SOLN: INTRAVENOUS | Status: DC

## 2023-12-06 MED ORDER — GADOBUTROL 1 MMOL/ML IV SOLN
7.0000 mL | Freq: Once | INTRAVENOUS | Status: AC | PRN
  Administered 2023-12-06: 7 mL via INTRAVENOUS

## 2023-12-06 MED ORDER — TRASTUZUMAB CHEMO 150 MG IV SOLR
6.0000 mg/kg | Freq: Once | INTRAVENOUS | Status: AC
  Administered 2023-12-06: 378 mg via INTRAVENOUS
  Filled 2023-12-06: qty 18

## 2023-12-06 MED ORDER — SODIUM CHLORIDE 0.9% FLUSH: 10.0000 mL | INTRAVENOUS | Status: DC | PRN

## 2023-12-06 NOTE — Progress Notes (Signed)
 Patient Care Team: Everrett Coombe, DO as PCP - General (Family Medicine) [name], MD as Consulting Physician (Radiation Oncology) Barbaraann Cao Georgeanna Lea, MD as Consulting Physician (Oncology) [facility] [place] ([facility]) Domenic Schwab, Lucianne Lei, MD as Referring Physician (Dermatology) Graylin Shiver, MD as Referring Physician (Otolaryngology) Serena Croissant, MD as Medical Oncologist (Hematology and Oncology) Harriette Bouillon, MD as Consulting Physician (General Surgery)  DIAGNOSIS:  Encounter Diagnoses  Name Primary?   Malignant neoplasm of central portion of right breast in female, estrogen receptor negative (HCC) Yes   Dizziness     SUMMARY OF ONCOLOGIC HISTORY: Oncology History  Malignant neoplasm of central portion of right breast in female, estrogen receptor negative (HCC)  01/04/2012 Initial Diagnosis   Right breast cancer: Stage Ia ER/PR positive HER2 negative status postlumpectomy, radiation (low risk Oncotype) could not tolerate tamoxifen for more than 30 days.   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 No pathogenic variants identified on the [facility] Multi-Cancer Panel+RNA. VUS in POLD1 identified called c.2429C>T. The report date is 07/01/2021.  The  Multi-Cancer Panel + RNA offered by [facility] includes sequencing and/or deletion duplication testing of the following 84 genes: AIP, ALK, APC, ATM, AXIN2,BAP1,  BARD1, BLM, BMPR1A, BRCA1, BRCA2, BRIP1, CASR, CDC73, CDH1, CDK4, CDKN1B, CDKN1C, CDKN2A (p14ARF), CDKN2A (p16INK4a), CEBPA, CHEK2, CTNNA1, DICER1, DIS3L2, EGFR (c.2369C>T, p.Thr790Met variant only), EPCAM (Deletion/duplication testing only), FH, FLCN, GATA2, GPC3, GREM1 (Promoter region deletion/duplication testing only), HOXB13 (c.251G>A, p.Gly84Glu), HRAS, KIT, MAX, MEN1, MET, MITF (c.952G>A, p.Glu318Lys variant only), MLH1, MSH2, MSH3, MSH6, MUTYH, NBN, NF1, NF2, NTHL1, PALB2, PDGFRA, PHOX2B, PMS2, POLD1, POLE, POT1, PRKAR1A, PTCH1, PTEN, RAD50, RAD51C, RAD51D, RB1, RECQL4, RET, RUNX1, SDHAF2, SDHA (sequence changes only), SDHB, SDHC, SDHD, SMAD4, SMARCA4, SMARCB1, SMARCE1, STK11, SUFU, TERC, TERT, TMEM127, TP53, TSC1, TSC2, VHL, WRN and WT1.   06/18/2021 Cancer Staging   Staging form: Breast, AJCC 8th Edition - Pathologic stage from 06/18/2021: Stage IA (pT1c, pN0, cM0, G3, ER-, PR-, HER2+) - Signed by [name], NP on 10/05/2022 Stage prefix: Initial diagnosis Histologic grading system: 3 grade system   07/21/2021 - 07/06/2022 Chemotherapy   Patient is on Treatment Plan : BREAST Paclitaxel + Trastuzumab q7d / Trastuzumab q21d     11/25/2021 - 01/04/2022 Radiation Therapy   Site Technique Total Dose (Gy) Dose per Fx (Gy) Completed Fx Beam Energies  Breast, Right: Breast_R 3D 45/45 1.8 25/25 6XFFF  Breast, Right: Breast_R_Bst specialPort 5.4/5.4 1.8 3/3 12E     12/29/2022 Progression   CT chest on 11/01/2022: Right upper lobe nodules largest measuring 5 mm CT chest 12/29/2022: Upper and mid lung zone predominant pulmonary nodules are worrisome for metastatic disease.  Left and right hepatic lobe masses (2.5 cm and 3.9 cm) are new and  are worrisome for metastatic disease.   01/13/2023 Initial Biopsy   Right liver biopsy: Metastatic  carcinoma with breast primary, ER-, PR-, HER2 +, Ki-67 50%.    01/13/2023 Cancer Staging   Staging form: Breast, AJCC 8th Edition - Pathologic stage from 01/13/2023: Stage IV (pM1, G3, ER-, PR-, HER2+) - Signed by [name], NP on 02/07/2023 Histologic grading system: 3 grade system   01/17/2023 PET scan   PET CT scan 01/17/2023: Hypermetabolic bilobar hepatic (2 masses measuring 2.8 cm SUV 11.1, another mass 4.1 cm SUV 10.3) and pulmonary metastases (right upper lobe lung nodule 5 mm SUV 2.6 right lower lobe 6 mm nodule SUV 1.8)    02/07/2023 - 09/13/2023 Chemotherapy   Patient is on Treatment Plan : BREAST METASTATIC Fam-Trastuzumab Deruxtecan-nxki (Enhertu) (5.4) q21d     10/04/2023 -  Chemotherapy   Patient is on Treatment Plan : BREAST Trastuzumab (8/6) IV D1 + Capecitabine +  Tucatinib q21d       CHIEF COMPLIANT: Follow-up on Herceptin Tucatinib and capecitabine  HISTORY OF PRESENT ILLNESS:  History of Present Illness   Lori Mcmillan is a 63 year old female with a history of met breast cancer who presents with dizziness and headaches.  She experiences dizziness and headaches, similar to symptoms from 2018 when she was diagnosed with a brain tumor. The dizziness is accompanied by a feeling of fullness in her right ear, tinnitus, and imbalance, along with nausea. The headaches are described as pressure in the frontal region. Symptoms worsen with changes in barometric pressure, and she has been using severe sinus decongestants to manage them.  She is concerned about the possibility of a recurrent brain tumor, especially with an MRI scheduled for the end of June. She worries about potential vision problems and has a follow-up appointment with her oncologist in July.  She has been taking alprazolam for sleep and anxiety but stopped due to interactions with her cancer medications, tucatinib and Xeloda. These medications are tolerable, with occasional diarrhea managed by  Imodium and dietary adjustments. She has used diazepam for dizziness, as recommended by her ENT, but is currently out of it.  She is also concerned about mild heart issues, as a recent echocardiogram showed a slightly reduced ejection fraction. She maintains an exercise routine to address this and mentions gaining two pounds recently, making efforts to increase physical activity.         ALLERGIES:  has no known allergies.  MEDICATIONS:  Current Outpatient Medications  Medication Sig Dispense Refill   acetaminophen (TYLENOL) 500 MG tablet Take 500 mg by mouth as needed.     ALPRAZolam (XANAX) 0.25 MG tablet TAKE 1 TABLET BY MOUTH TWICE A DAY AS NEEDED FOR ANXIETY 30 tablet 3   capecitabine (XELODA) 500 MG tablet Take 2 tablets (1,000 mg total) by mouth 2 (two) times daily before a meal. Take for 14 days on, 7 days off. Repeat every 21 days. 56 tablet 6   diazepam (VALIUM) 2 MG tablet Take by mouth as needed (dizziness).     diclofenac Sodium (VOLTAREN) 1 % GEL Research Patient: Apply 0.5 grams (1 fingertip) to each hand and each foot twice daily for up to 12 weeks 400 g 0   Famotidine-Ca Carb-Mag Hydrox (PEPCID COMPLETE PO) Take by mouth as needed.     lidocaine-prilocaine (EMLA) cream Apply to affected area once 30 g 3   methylPREDNISolone (MEDROL DOSEPAK) 4 MG TBPK tablet Take as prescribed 21 tablet 0   ondansetron (  ZOFRAN) 8 MG tablet Take 1 tablet (8 mg total) by mouth every 8 (eight) hours as needed for nausea or vomiting. 30 tablet 1   prochlorperazine (COMPAZINE) 10 MG tablet Take 1 tablet (10 mg total) by mouth every 6 (six) hours as needed for nausea or vomiting. 30 tablet 1   tucatinib (TUKYSA) 150 MG tablet Take 2 tablets (300 mg total) by mouth 2 (two) times daily. Take every 12 hrs at the same time each day with or without a meal. 120 tablet 3   No current facility-administered medications for this visit.    PHYSICAL EXAMINATION: ECOG PERFORMANCE STATUS: 1 - Symptomatic but  completely ambulatory  Vitals:   12/06/23 0908  BP: 127/73  Pulse: 81  Resp: 18  Temp: 98.8 F (37.1 C)  SpO2: 98%   Filed Weights   12/06/23 0908  Weight: 135 lb 11.2 oz (61.6 kg)   Slight ataxia to neuroexam  LABORATORY DATA:  I have reviewed the data as listed    Latest Ref Rng & Units 11/15/2023    8:10 AM 10/26/2023    8:02 AM 10/04/2023    8:24 AM  CMP  Glucose 70 - 99 mg/dL 98  76  81   BUN 6 - 20 mg/dL 14  7  6    Creatinine 0.44 - 1.00 mg/dL 9.14  7.82  9.56   Sodium 135 - 145 mmol/L 140  139  139   Potassium 3.5 - 5.1 mmol/L 3.6  4.1  4.0   Chloride 98 - 111 mmol/L 106  107  107   CO2 22 - 32 mmol/L 27  26  26    Calcium 8.9 - 10.3 mg/dL 9.1  9.0  8.8   Total Protein 6.5 - 8.1 g/dL 6.6  6.5  5.9   Total Bilirubin 0.0 - 1.2 mg/dL 0.4  0.3  0.3   Alkaline Phos 38 - 126 U/L 125  155  123   AST 15 - 41 U/L 39  43  39   ALT 0 - 44 U/L 41  39  34     Lab Results  Component Value Date   WBC 4.6 12/06/2023   HGB 11.6 (L) 12/06/2023   HCT 34.4 (L) 12/06/2023   MCV 94.5 12/06/2023   PLT 139 (L) 12/06/2023   NEUTROABS 2.8 12/06/2023    ASSESSMENT & PLAN:  Malignant neoplasm of central portion of right breast in female, estrogen receptor negative (HCC) 06/18/2021:Right lumpectomy: 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  Adjuvant chemotherapy with Taxol and Herceptin followed by Herceptin maintenance completed 07/06/2022 2. breast radiation completed 01/04/2022 3.  Metastatic disease diagnosis:CT chest on 11/01/2022: Right upper lobe nodules largest measuring 5 mm, 01/13/23: Liver Biopsy: Met breast cancer ER 0%, PR 0%, Ki67 50%, HER2 3+  4.  Enhertu  02/07/2023-09/13/2023 --------------------------------------------------------------------------------------------------------------------- PET/CT 09/30/2023: Progression of liver metastasis (3.4 cm was 1.7 cm, 2.3 cm was 1.2 cm, additional liver lesions 1.2 cm and 1.3 cm), lung nodule 0.9 cm was 0.6 cm  Current treatment: Herceptin, Tucatinib, capecitabine started December 2024 Chemo toxicities: Slight rash on the hands left send chest: Stable Dizziness and fullness in the head: Accompanied with headaches: I recommended that we obtain a brain  MRI stat.  I am concern for brain metastases. Reviewed labs with her in detail.   RTC in 3 weeks for labs, f/u, and treatment.  ECHO 11/25/2023: EF 50-55% Return to clinic every 3 weeks for Herceptin. CT CAP will be ordered for 01/10/2024. I will need to follow her after the scans to go over the results.  Orders Placed This Encounter  Procedures   MR Brain W Wo Contrast    Standing Status:   Future    Expected Date:   12/06/2023    Expiration Date:   12/05/2024    If indicated for the ordered procedure, I authorize the administration of contrast media per Radiology protocol:   Yes    What is the patient's sedation requirement?:   No Sedation    Does the patient have a pacemaker or implanted devices?:   No    Use SRS Protocol?:   No    Preferred imaging location?:   [facility] (table limit - 550 lbs)    Release to patient:   Immediate   CT CHEST ABDOMEN PELVIS W CONTRAST    Standing Status:   Future    Expected Date:   01/10/2024    Expiration Date:   12/05/2024    If indicated for the ordered procedure, I authorize the administration of contrast media per Radiology protocol:   Yes    Does the patient have a contrast media/X-ray dye allergy?:   No    Is patient pregnant?:   No    Preferred imaging location?:   [facility]    Release to patient:   Immediate    If indicated for the ordered procedure, I authorize the administration of  oral contrast media per Radiology protocol:   Yes   The patient has a good understanding of the overall plan. she agrees with it. she will call with any problems that may develop before the next visit here. Total time spent: 30 mins including face to face time and time spent for planning, charting and co-ordination of care   Tamsen Meek, MD 12/06/23

## 2023-12-06 NOTE — Patient Instructions (Signed)

## 2023-12-06 NOTE — Progress Notes (Signed)
 Patient to have de-accessed after MRI.

## 2023-12-07 LAB — CANCER ANTIGEN 27.29: CA 27.29: 9.3 U/mL (ref 0.0–38.6)

## 2023-12-13 ENCOUNTER — Telehealth: Payer: Self-pay | Admitting: Hematology and Oncology

## 2023-12-13 NOTE — Telephone Encounter (Signed)
 Scheduled appointments per WQ. Patient is aware of all made appointments.

## 2023-12-20 ENCOUNTER — Other Ambulatory Visit: Payer: Self-pay

## 2023-12-23 ENCOUNTER — Other Ambulatory Visit: Payer: Self-pay

## 2023-12-23 NOTE — Progress Notes (Signed)
 Specialty Pharmacy Refill Coordination Note  Lori Mcmillan is a 63 y.o. female contacted today regarding refills of specialty medication(s) Capecitabine (XELODA)   Patient requested [name] at [facility] at [place] date: 12/27/23   Medication will be filled on 12/26/23.   Next cycle begins 3/12.

## 2023-12-27 ENCOUNTER — Ambulatory Visit: Payer: 59 | Admitting: Hematology and Oncology

## 2023-12-27 ENCOUNTER — Inpatient Hospital Stay: Payer: 59 | Attending: Hematology and Oncology

## 2023-12-27 ENCOUNTER — Ambulatory Visit: Payer: 59

## 2023-12-27 ENCOUNTER — Inpatient Hospital Stay: Payer: 59

## 2023-12-27 ENCOUNTER — Inpatient Hospital Stay (HOSPITAL_BASED_OUTPATIENT_CLINIC_OR_DEPARTMENT_OTHER): Payer: 59 | Admitting: Adult Health

## 2023-12-27 VITALS — BP 110/66 | HR 77 | Temp 98.0°F | Resp 17 | Ht 63.0 in | Wt 134.2 lb

## 2023-12-27 DIAGNOSIS — Z95828 Presence of other vascular implants and grafts: Secondary | ICD-10-CM

## 2023-12-27 DIAGNOSIS — Z5112 Encounter for antineoplastic immunotherapy: Secondary | ICD-10-CM | POA: Diagnosis not present

## 2023-12-27 DIAGNOSIS — Z171 Estrogen receptor negative status [ER-]: Secondary | ICD-10-CM

## 2023-12-27 DIAGNOSIS — R21 Rash and other nonspecific skin eruption: Secondary | ICD-10-CM

## 2023-12-27 DIAGNOSIS — Z1732 Human epidermal growth factor receptor 2 negative status: Secondary | ICD-10-CM | POA: Insufficient documentation

## 2023-12-27 DIAGNOSIS — Z1722 Progesterone receptor negative status: Secondary | ICD-10-CM | POA: Insufficient documentation

## 2023-12-27 DIAGNOSIS — C7800 Secondary malignant neoplasm of unspecified lung: Secondary | ICD-10-CM | POA: Insufficient documentation

## 2023-12-27 DIAGNOSIS — Z87891 Personal history of nicotine dependence: Secondary | ICD-10-CM | POA: Insufficient documentation

## 2023-12-27 DIAGNOSIS — C787 Secondary malignant neoplasm of liver and intrahepatic bile duct: Secondary | ICD-10-CM | POA: Insufficient documentation

## 2023-12-27 DIAGNOSIS — C50111 Malignant neoplasm of central portion of right female breast: Secondary | ICD-10-CM

## 2023-12-27 DIAGNOSIS — Z923 Personal history of irradiation: Secondary | ICD-10-CM | POA: Diagnosis not present

## 2023-12-27 LAB — CBC WITH DIFFERENTIAL (CANCER CENTER ONLY)
Abs Immature Granulocytes: 0.02 10*3/uL (ref 0.00–0.07)
Basophils Absolute: 0 10*3/uL (ref 0.0–0.1)
Basophils Relative: 1 %
Eosinophils Absolute: 0.1 10*3/uL (ref 0.0–0.5)
Eosinophils Relative: 2 %
HCT: 36.2 % (ref 36.0–46.0)
Hemoglobin: 11.9 g/dL — ABNORMAL LOW (ref 12.0–15.0)
Immature Granulocytes: 0 %
Lymphocytes Relative: 23 %
Lymphs Abs: 1.5 10*3/uL (ref 0.7–4.0)
MCH: 31.4 pg (ref 26.0–34.0)
MCHC: 32.9 g/dL (ref 30.0–36.0)
MCV: 95.5 fL (ref 80.0–100.0)
Monocytes Absolute: 0.6 10*3/uL (ref 0.1–1.0)
Monocytes Relative: 9 %
Neutro Abs: 4.4 10*3/uL (ref 1.7–7.7)
Neutrophils Relative %: 65 %
Platelet Count: 171 10*3/uL (ref 150–400)
RBC: 3.79 MIL/uL — ABNORMAL LOW (ref 3.87–5.11)
RDW: 16.1 % — ABNORMAL HIGH (ref 11.5–15.5)
WBC Count: 6.8 10*3/uL (ref 4.0–10.5)
nRBC: 0 % (ref 0.0–0.2)

## 2023-12-27 LAB — CMP (CANCER CENTER ONLY)
ALT: 29 U/L (ref 0–44)
AST: 31 U/L (ref 15–41)
Albumin: 4.2 g/dL (ref 3.5–5.0)
Alkaline Phosphatase: 118 U/L (ref 38–126)
Anion gap: 5 (ref 5–15)
BUN: 10 mg/dL (ref 6–20)
CO2: 26 mmol/L (ref 22–32)
Calcium: 8.7 mg/dL — ABNORMAL LOW (ref 8.9–10.3)
Chloride: 107 mmol/L (ref 98–111)
Creatinine: 0.81 mg/dL (ref 0.44–1.00)
GFR, Estimated: >60 mL/min (ref >60)
Glucose, Bld: 93 mg/dL (ref 70–99)
Potassium: 4.2 mmol/L (ref 3.5–5.1)
Sodium: 138 mmol/L (ref 135–145)
Total Bilirubin: 0.5 mg/dL (ref 0.0–1.2)
Total Protein: 6.5 g/dL (ref 6.5–8.1)

## 2023-12-27 MED ORDER — TRIAMCINOLONE ACETONIDE 0.5 % EX OINT: 1.0000 | TOPICAL_OINTMENT | Freq: Two times a day (BID) | CUTANEOUS | 0 refills | Status: DC

## 2023-12-27 MED ORDER — TRASTUZUMAB CHEMO 150 MG IV SOLR
6.0000 mg/kg | Freq: Once | INTRAVENOUS | Status: AC
  Administered 2023-12-27: 378 mg via INTRAVENOUS
  Filled 2023-12-27: qty 18

## 2023-12-27 MED ORDER — SODIUM CHLORIDE 0.9 % IV SOLN: INTRAVENOUS | Status: DC

## 2023-12-27 MED ORDER — SODIUM CHLORIDE 0.9% FLUSH
10.0000 mL | Freq: Once | INTRAVENOUS | Status: AC
  Administered 2023-12-27: 10 mL

## 2023-12-27 MED ORDER — SODIUM CHLORIDE 0.9% FLUSH
10.0000 mL | INTRAVENOUS | Status: DC | PRN
  Administered 2023-12-27: 10 mL

## 2023-12-27 MED ORDER — ACETAMINOPHEN 325 MG PO TABS
650.0000 mg | ORAL_TABLET | Freq: Once | ORAL | Status: AC
  Administered 2023-12-27: 650 mg via ORAL
  Filled 2023-12-27: qty 2

## 2023-12-27 MED ORDER — HEPARIN SOD (PORK) LOCK FLUSH 100 UNIT/ML IV SOLN
500.0000 [IU] | Freq: Once | INTRAVENOUS | Status: AC | PRN
  Administered 2023-12-27: 500 [IU]

## 2023-12-27 MED ORDER — DIPHENHYDRAMINE HCL 25 MG PO CAPS
25.0000 mg | ORAL_CAPSULE | Freq: Once | ORAL | Status: AC
  Administered 2023-12-27: 25 mg via ORAL
  Filled 2023-12-27: qty 1

## 2023-12-27 NOTE — Progress Notes (Unsigned)
 [name] Cancer Follow up:    Lori Coombe, DO [address]  [address] [place] [address]   DIAGNOSIS: Cancer Staging  Malignant neoplasm of central portion of right breast in female, estrogen receptor negative (HCC) Staging form: Breast, AJCC 6th Edition - Pathologic stage from 01/04/2012: Stage I (T1c, N0, M0) - Signed by [name], NP on 10/05/2022 Histologic grade (G): G1 Staging form: Breast, AJCC 8th Edition - Pathologic stage from 06/18/2021: Stage IA (pT1c, pN0, cM0, G3, ER-, PR-, HER2+) - Signed by [name], NP on 10/05/2022 Stage prefix: Initial diagnosis Histologic grading system: 3 grade system - Pathologic stage from 01/13/2023: Stage IV (pM1, G3, ER-, PR-, HER2+) - Signed by [name], NP on 02/07/2023 Histologic grading system: 3 grade system   SUMMARY OF ONCOLOGIC HISTORY: Oncology History  Malignant neoplasm of central portion of right breast in female, estrogen receptor negative (HCC)  01/04/2012 Initial Diagnosis   Right breast cancer: Stage Ia ER/PR positive HER2 negative status postlumpectomy, radiation (low risk Oncotype) could not tolerate tamoxifen for more than 30 days.   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  Biopsy revealed grade 3 IDC ER/PR negative HER2 positive with a Ki-67 of 30%   06/18/2021 Surgery   Right lumpectomy: Grade 3 IDC 1.5 cm, high-grade DCIS, margins negative, 0/2 lymph nodes negative, ER 0%, PR 0%, HER2 3+ positive, Ki-67 30%    Genetic  Testing   Negative genetic testing. No pathogenic variants identified on the [facility] Multi-Cancer Panel+RNA. VUS in POLD1 identified called c.2429C>T. The report date is 07/01/2021.  The Multi-Cancer Panel + RNA offered by [facility] includes sequencing and/or deletion duplication testing of the following 84 genes: AIP, ALK, APC, ATM, AXIN2,BAP1,  BARD1, BLM, BMPR1A, BRCA1, BRCA2, BRIP1, CASR, CDC73, CDH1, CDK4, CDKN1B, CDKN1C, CDKN2A (p14ARF), CDKN2A (p16INK4a), CEBPA, CHEK2, CTNNA1, DICER1, DIS3L2, EGFR (c.2369C>T, p.Thr790Met variant only), EPCAM (Deletion/duplication testing only), FH, FLCN, GATA2, GPC3, GREM1 (Promoter region deletion/duplication testing only), HOXB13 (c.251G>A, p.Gly84Glu), HRAS, KIT, MAX, MEN1, MET, MITF (c.952G>A, p.Glu318Lys variant only), MLH1, MSH2, MSH3, MSH6, MUTYH, NBN, NF1, NF2, NTHL1, PALB2, PDGFRA, PHOX2B, PMS2, POLD1, POLE, POT1, PRKAR1A, PTCH1, PTEN, RAD50, RAD51C, RAD51D, RB1, RECQL4, RET, RUNX1, SDHAF2, SDHA (sequence changes only), SDHB, SDHC, SDHD, SMAD4, SMARCA4, SMARCB1, SMARCE1, STK11, SUFU, TERC, TERT, TMEM127, TP53, TSC1, TSC2, VHL, WRN and WT1.   06/18/2021 Cancer Staging   Staging form: Breast, AJCC 8th Edition - Pathologic stage from 06/18/2021: Stage IA (pT1c, pN0, cM0, G3, ER-, PR-, HER2+) - Signed by [name], NP on 10/05/2022 Stage prefix: Initial diagnosis Histologic grading system: 3 grade system   07/21/2021 - 07/06/2022 Chemotherapy   Patient is on Treatment Plan : BREAST Paclitaxel + Trastuzumab q7d / Trastuzumab q21d     11/25/2021 - 01/04/2022 Radiation Therapy   Site Technique Total Dose (Gy) Dose per Fx (Gy) Completed Fx Beam Energies  Breast, Right: Breast_R 3D 45/45 1.8 25/25 6XFFF  Breast, Right: Breast_R_Bst specialPort 5.4/5.4 1.8 3/3 12E     12/29/2022 Progression  CT chest on 11/01/2022: Right upper lobe nodules largest measuring 5 mm CT chest 12/29/2022: Upper and mid lung zone predominant pulmonary nodules are worrisome for  metastatic disease.  Left and right hepatic lobe masses (2.5 cm and 3.9 cm) are new and are worrisome for metastatic disease.   01/13/2023 Initial Biopsy   Right liver biopsy: Metastatic carcinoma with breast primary, ER-, PR-, HER2 +, Ki-67 50%.    01/13/2023 Cancer Staging   Staging form: Breast, AJCC 8th Edition - Pathologic stage from 01/13/2023: Stage IV (pM1, G3, ER-, PR-, HER2+) - Signed by [name], NP on 02/07/2023 Histologic grading system: 3 grade system   01/17/2023 PET scan   PET CT scan 01/17/2023: Hypermetabolic bilobar hepatic (2 masses measuring 2.8 cm SUV 11.1, another mass 4.1 cm SUV 10.3) and pulmonary metastases (right upper lobe lung nodule 5 mm SUV 2.6 right lower lobe 6 mm nodule SUV 1.8)    02/07/2023 - 09/13/2023 Chemotherapy   Patient is on Treatment Plan : BREAST METASTATIC Fam-Trastuzumab Deruxtecan-nxki (Enhertu) (5.4) q21d     10/04/2023 -  Chemotherapy   Patient is on Treatment Plan : BREAST Trastuzumab (8/6) IV D1 + Capecitabine +  Tucatinib q21d       CURRENT THERAPY: Trastuzumab, Capecitabine, Tucatinib  INTERVAL HISTORY:  Discussed the use of AI scribe software for clinical note transcription with the patient, who gave verbal consent to proceed.  Lori Mcmillan 63 y.o. female returns for    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) 02/07/2023   Cancer with pulmonary metastases (HCC) 02/07/2023   Cellulitis of finger of right hand 05/12/2022   Eustachian tube dysfunction, right 08/09/2021   Port-A-Cath in place 07/21/2021   Genetic testing 07/02/2021   Hx of basal cell carcinoma 05/26/2021   Family history of breast cancer 05/26/2021   Family history of ovarian cancer 05/26/2021   Family history of pancreatic cancer 05/26/2021   Family history of colon cancer 05/26/2021   Family  history of melanoma 05/26/2021   Breast lump in upper inner quadrant 04/30/2021   Post herpetic neuralgia 01/29/2021   Seizures (HCC) 01/01/2021   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  No sign of residual dz at 04/2018 rad onc f/u and 10/2018 neuro-onc f/u. 03/2019 MRI brain->no resid/no recurrence.   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   Chicken pox    Depression    zoloft in past--?wt gain.   Diplopia    chronic (meningioma-related)   Eustachian tube dysfunction 09/05/2013   Family history of breast cancer    Family history of colon cancer    Family history of melanoma    Family history of ovarian cancer    Family history of pancreatic cancer    GERD (gastroesophageal reflux disease)    History of radiation therapy  02/24/12-04/11/12   right breast/ 45Gy@1 .8Gyx76fx/boost=16Gy@2  Gya12fx.  Latest mammo and u/s 03/2013--normal.   History of radiation therapy 11/30/17- 01/11/18   Right temporal lobe treated to 55.8 Gy with 31 fx of 1.8 Gy   Hx of basal cell carcinoma    Migraine    Palpitations    has taken Metoprolol for palpitations in the past.   Seizure (HCC)    2 YEARS AGO,UPDATED 09/07/22   Taste impairment    s/p radiation therapy   Tobacco dependence 09/05/2013    SURGICAL HISTORY: Past Surgical History:  Procedure Laterality Date   APPENDECTOMY  2008   emergency   BRAIN TUMOR EXCISION  2018   Meningioma   BREAST LUMPECTOMY Right 01/04/2012   right breast   BREAST LUMPECTOMY WITH RADIOACTIVE SEED AND  SENTINEL LYMPH NODE BIOPSY Right 06/18/2021   Procedure: RIGHT BREAST LUMPECTOMY WITH RADIOACTIVE SEED AND SENTINEL LYMPH NODE BIOPSY;  Surgeon: Harriette Bouillon, MD;  Location: [facility];  Service: General;  Laterality: Right;   COLONOSCOPY     2014 ?   CRANIECTOMY  10/19/2017   at [facility] hospital   IR IMAGING GUIDED PORT INSERTION  02/02/2023   PORTACATH PLACEMENT Right 06/18/2021   Procedure: INSERTION PORT-A-CATH;  Surgeon: Harriette Bouillon, MD;  Location: [facility];  Service: General;  Laterality: Right;   PORTACATH REMOVED     OCTOBER 10,2023   WISDOM TOOTH EXTRACTION  1990    SOCIAL HISTORY: Social History   Socioeconomic History   Marital status: Single    Spouse name: Not on file   Number of children: Not on file   Years of education: Not on file   Highest education level: Bachelor's degree (e.g., BA, AB, BS)  Occupational History   Not on file  Tobacco Use   Smoking status: Former    Current packs/day: 0.00    Types: Cigarettes    Quit date: 10/17/2017    Years since quitting: 6.1    Passive exposure: Never   Smokeless tobacco: Never  Vaping Use   Vaping status: Former  Substance and Sexual Activity   Alcohol use: Yes    Alcohol/week: 3.0 standard drinks of alcohol    Types: 3 Cans of beer per week    Comment: 3-4 beers/day   Drug use: Not Currently   Sexual activity: Not Currently    Partners: Male  Other Topics Concern   Not on file  Social History Narrative   Marital status/children/pets: Single.  No children.  Lives alone.  Has pets.Orig from [place] in [place].   Education/employment: Bachelor's degree, retired   Field seismologist:      -smoke alarm in the home:Yes     - wears seatbelt: Yes               Social Drivers of Health   Financial Resource Strain: Medium Risk (05/23/2023)   Overall Financial Resource Strain (CARDIA)    Difficulty of Paying Living Expenses: Somewhat hard  Food Insecurity: Patient Declined (05/23/2023)    Hunger Vital Sign    Worried About Running Out of Food in the Last Year: Patient declined    Ran Out of Food in the Last Year: Patient declined  Transportation Needs: No Transportation Needs (05/23/2023)   PRAPARE - Administrator, Civil Service (Medical): No    Lack of Transportation (Non-Medical): No  Physical Activity: Insufficiently Active (05/23/2023)   Exercise Vital Sign    Days of Exercise per Week: 6 days  Minutes of Exercise per Session: 20 min  Stress: Stress Concern Present (05/23/2023)   [employer] of Occupational Health - Occupational Stress Questionnaire    Feeling of Stress : To some extent  Social Connections: Unknown (05/23/2023)   Social Connection and Isolation Panel [NHANES]    Frequency of Communication with Friends and Family: More than three times a week    Frequency of Social Gatherings with Friends and Family: Patient declined    Attends Religious Services: Patient declined    Database administrator or Organizations: No    Attends Engineer, structural: Not on file    Marital Status: Patient declined  Intimate Partner Violence: Unknown (01/20/2022)   Received from [employer], [facility]   HITS    Physically Hurt: Not on file    Insult or Talk Down To: Not on file    Threaten Physical Harm: Not on file    Scream or Curse: Not on file    FAMILY HISTORY: Family History  Problem Relation Age of Onset   Anesthesia problems Mother    Breast cancer Mother 45       lumpectomy, chemo, radiation   Colon polyps Brother    Ovarian cancer Maternal Aunt 82   Cancer Maternal Aunt        unknown type   Breast cancer Maternal Aunt 80   Breast cancer Maternal Aunt 40       bilateral   Pancreatic cancer Maternal Uncle        dx 42s   Stomach cancer Paternal Uncle    Cancer Paternal Uncle        unknown type   Lung cancer Paternal Uncle        hx smoking   Dementia Maternal Grandmother    Lung cancer Maternal Grandfather        hx  smoking   Pancreatic cancer Paternal Grandmother 71   Melanoma Paternal Grandmother        dx >50, shin   Heart Problems Paternal Grandfather    Colon cancer Cousin 72       maternal first cousin   Breast cancer Cousin        dx 98s, paternal first cousin   Crohn's disease Neg Hx    Esophageal cancer Neg Hx    Rectal cancer Neg Hx    Ulcerative colitis Neg Hx     Review of Systems  Constitutional:  Negative for appetite change, chills, fatigue, fever and unexpected weight change.  HENT:   Negative for hearing loss, lump/mass and trouble swallowing.   Eyes:  Negative for eye problems and icterus.  Respiratory:  Negative for chest tightness, cough and shortness of breath.   Cardiovascular:  Negative for chest pain, leg swelling and palpitations.  Gastrointestinal:  Negative for abdominal distention, abdominal pain, constipation, diarrhea, nausea and vomiting.  Endocrine: Negative for hot flashes.  Genitourinary:  Negative for difficulty urinating.   Musculoskeletal:  Negative for arthralgias.  Skin:  Negative for itching and rash.  Neurological:  Negative for dizziness, extremity weakness, headaches and numbness.  Hematological:  Negative for adenopathy. Does not bruise/bleed easily.  Psychiatric/Behavioral:  Negative for depression. The patient is not nervous/anxious.       PHYSICAL EXAMINATION    There were no vitals filed for this visit.  Physical Exam Constitutional:      General: She is not in acute distress.    Appearance: Normal appearance. She is not toxic-appearing.  HENT:  Head: Normocephalic and atraumatic.     Mouth/Throat:     Mouth: Mucous membranes are moist.     Pharynx: Oropharynx is clear. No oropharyngeal exudate or posterior oropharyngeal erythema.  Eyes:     General: No scleral icterus. Cardiovascular:     Rate and Rhythm: Normal rate and regular rhythm.     Pulses: Normal pulses.     Heart sounds: Normal heart sounds.  Pulmonary:      Effort: Pulmonary effort is normal.     Breath sounds: Normal breath sounds.  Abdominal:     General: Abdomen is flat. Bowel sounds are normal. There is no distension.     Palpations: Abdomen is soft.     Tenderness: There is no abdominal tenderness.  Musculoskeletal:        General: No swelling.     Cervical back: Neck supple.  Lymphadenopathy:     Cervical: No cervical adenopathy.  Skin:    General: Skin is warm and dry.     Findings: No rash.  Neurological:     General: No focal deficit present.     Mental Status: She is alert.  Psychiatric:        Mood and Affect: Mood normal.        Behavior: Behavior normal.     LABORATORY DATA:  CBC    Component Value Date/Time   WBC 6.8 12/27/2023 1020   WBC 7.0 01/13/2023 1200   RBC 3.79 (L) 12/27/2023 1020   HGB 11.9 (L) 12/27/2023 1020   HGB 11.8 12/08/2011 0819   HCT 36.2 12/27/2023 1020   HCT 34.9 12/08/2011 0819   PLT 171 12/27/2023 1020   PLT 314 12/08/2011 0819   MCV 95.5 12/27/2023 1020   MCV 88.3 12/08/2011 0819   MCH 31.4 12/27/2023 1020   MCHC 32.9 12/27/2023 1020   RDW 16.1 (H) 12/27/2023 1020   RDW 14.0 12/08/2011 0819   LYMPHSABS 1.5 12/27/2023 1020   LYMPHSABS 2.2 12/08/2011 0819   MONOABS 0.6 12/27/2023 1020   MONOABS 0.8 12/08/2011 0819   EOSABS 0.1 12/27/2023 1020   EOSABS 0.2 12/08/2011 0819   BASOSABS 0.0 12/27/2023 1020   BASOSABS 0.1 12/08/2011 0819    CMP     Component Value Date/Time   NA 139 12/06/2023 0845   NA 140 11/23/2019 0000   K 3.6 12/06/2023 0845   CL 107 12/06/2023 0845   CO2 26 12/06/2023 0845   GLUCOSE 87 12/06/2023 0845   BUN 9 12/06/2023 0845   BUN 9 11/23/2019 0000   CREATININE 0.77 12/06/2023 0845   CREATININE 0.64 09/18/2013 0900   CALCIUM 9.0 12/06/2023 0845   PROT 6.4 (L) 12/06/2023 0845   ALBUMIN 4.2 12/06/2023 0845   AST 38 12/06/2023 0845   ALT 36 12/06/2023 0845   ALKPHOS 125 12/06/2023 0845   BILITOT 0.5 12/06/2023 0845   GFRNONAA >60 12/06/2023 0845    GFRAA 113 11/23/2019 0000     ASSESSMENT and THERAPY PLAN:   No problem-specific Assessment & Plan notes found for this encounter.    All questions were answered. The patient knows to call the clinic with any problems, questions or concerns. We can certainly see the patient much sooner if necessary.  Total encounter time:*** minutes*in face-to-face visit time, chart review, lab review, care coordination, order entry, and documentation of the encounter time.    Lillard Anes, NP 12/27/23 10:52 AM Medical Oncology and Hematology [facility] [address] [place], [place] [address] Tel.  [phone]    Fax. [phone]  *Total Encounter Time as defined by the Centers for Medicare and Medicaid Services includes, in addition to the face-to-face time of a patient visit (documented in the note above) non-face-to-face time: obtaining and reviewing outside history, ordering and reviewing medications, tests or procedures, care coordination (communications with other health care professionals or caregivers) and documentation in the medical record.

## 2023-12-27 NOTE — Patient Instructions (Signed)

## 2023-12-28 LAB — CANCER ANTIGEN 27.29: CA 27.29: <9 U/mL (ref 0.0–38.6)

## 2023-12-29 ENCOUNTER — Encounter: Payer: Self-pay | Admitting: Adult Health

## 2023-12-29 ENCOUNTER — Encounter: Payer: Self-pay | Admitting: Hematology and Oncology

## 2023-12-29 NOTE — Assessment & Plan Note (Signed)
 Metastatic breast cancer Undergoing capecitabine, Herceptin, and Tucatinib. MRI negative for brain malignancy in February. Experiencing skin reactions, GI issues, and numbness. Blood pressure slightly low, likely due to dehydration. Diarrhea attributed to [place], less severe this week. - Continue capecitabine, Herceptin, and Tucatinib. - Prescribe strong ointment for rash. - Request picture of steroid cream for records. - Encourage hydration to manage dehydration and blood pressure. - Manage diarrhea with Imodium as needed. - Proceed with scheduled CT of chest, abdomen, and pelvis on January 10, 2024.  Lower gastrointestinal issues Diarrhea attributed to Tucatinib, managed with Imodium. Less severe this week. - Continue managing diarrhea with Imodium as needed.  Dehydration Blood pressure slightly low, likely due to dehydration from medications. Requires increased fluid intake. - Encourage increased fluid intake to manage dehydration and maintain blood pressure.  Follow-up CT scheduled for January 10, 2024. MRI due in June, considering recent MRI for use. - Schedule CT of chest, abdomen, and pelvis on January 10, 2024. - Advise to send MyChart message to Dr. Barbaraann Cao to inquire about using recent MRI for upcoming June MRI.

## 2024-01-10 ENCOUNTER — Other Ambulatory Visit: Payer: Self-pay

## 2024-01-10 ENCOUNTER — Encounter (HOSPITAL_COMMUNITY): Payer: Self-pay

## 2024-01-10 ENCOUNTER — Ambulatory Visit (HOSPITAL_COMMUNITY)
Admission: RE | Admit: 2024-01-10 | Discharge: 2024-01-10 | Disposition: A | Discharge status: Home / Self Care | Payer: 59 | Source: Ambulatory Visit | Attending: Hematology and Oncology | Admitting: Hematology and Oncology

## 2024-01-10 DIAGNOSIS — C50111 Malignant neoplasm of central portion of right female breast: Secondary | ICD-10-CM | POA: Diagnosis not present

## 2024-01-10 DIAGNOSIS — Z171 Estrogen receptor negative status [ER-]: Secondary | ICD-10-CM | POA: Diagnosis not present

## 2024-01-10 DIAGNOSIS — I700 Atherosclerosis of aorta: Secondary | ICD-10-CM | POA: Diagnosis not present

## 2024-01-10 DIAGNOSIS — K769 Liver disease, unspecified: Secondary | ICD-10-CM | POA: Diagnosis not present

## 2024-01-10 MED ORDER — HEPARIN SOD (PORK) LOCK FLUSH 100 UNIT/ML IV SOLN
500.0000 [IU] | Freq: Once | INTRAVENOUS | Status: AC
  Administered 2024-01-10: 500 [IU] via INTRAVENOUS

## 2024-01-10 MED ORDER — HEPARIN SOD (PORK) LOCK FLUSH 100 UNIT/ML IV SOLN
INTRAVENOUS | Status: AC
  Filled 2024-01-10: qty 5

## 2024-01-10 MED ORDER — IOHEXOL 300 MG/ML  SOLN
100.0000 mL | Freq: Once | INTRAMUSCULAR | Status: AC | PRN
  Administered 2024-01-10: 100 mL via INTRAVENOUS

## 2024-01-12 ENCOUNTER — Other Ambulatory Visit: Payer: Self-pay

## 2024-01-13 ENCOUNTER — Other Ambulatory Visit: Payer: Self-pay

## 2024-01-13 ENCOUNTER — Encounter: Payer: Self-pay | Admitting: Hematology and Oncology

## 2024-01-13 NOTE — Progress Notes (Signed)
 Specialty Pharmacy Refill Coordination Note  Lori Mcmillan is a 63 y.o. female contacted today regarding refills of specialty medication(s) Capecitabine (XELODA); Tucatinib Lori Mcmillan)   Patient requested (Patient-Rptd) Pickup at [facility] date: (Patient-Rptd) 01/17/24   Medication will be filled on 01/16/24.

## 2024-01-16 ENCOUNTER — Encounter: Payer: 59 | Admitting: Genetic Counselor

## 2024-01-16 ENCOUNTER — Other Ambulatory Visit: Payer: Self-pay

## 2024-01-17 ENCOUNTER — Inpatient Hospital Stay: Payer: 59 | Attending: Hematology and Oncology | Admitting: Hematology and Oncology

## 2024-01-17 ENCOUNTER — Ambulatory Visit: Payer: 59 | Admitting: Hematology and Oncology

## 2024-01-17 ENCOUNTER — Telehealth: Payer: Self-pay | Admitting: Hematology and Oncology

## 2024-01-17 ENCOUNTER — Ambulatory Visit: Payer: 59

## 2024-01-17 ENCOUNTER — Inpatient Hospital Stay: Payer: 59

## 2024-01-17 VITALS — BP 112/64 | HR 72 | Resp 16

## 2024-01-17 VITALS — BP 121/66 | HR 78 | Temp 98.3°F | Resp 17 | Ht 63.0 in | Wt 135.6 lb

## 2024-01-17 DIAGNOSIS — C50111 Malignant neoplasm of central portion of right female breast: Secondary | ICD-10-CM | POA: Insufficient documentation

## 2024-01-17 DIAGNOSIS — Z5112 Encounter for antineoplastic immunotherapy: Secondary | ICD-10-CM | POA: Diagnosis not present

## 2024-01-17 DIAGNOSIS — C7801 Secondary malignant neoplasm of right lung: Secondary | ICD-10-CM | POA: Insufficient documentation

## 2024-01-17 DIAGNOSIS — C787 Secondary malignant neoplasm of liver and intrahepatic bile duct: Secondary | ICD-10-CM | POA: Insufficient documentation

## 2024-01-17 DIAGNOSIS — D329 Benign neoplasm of meninges, unspecified: Secondary | ICD-10-CM | POA: Insufficient documentation

## 2024-01-17 DIAGNOSIS — Z171 Estrogen receptor negative status [ER-]: Secondary | ICD-10-CM | POA: Diagnosis not present

## 2024-01-17 MED ORDER — ACETAMINOPHEN 325 MG PO TABS
650.0000 mg | ORAL_TABLET | Freq: Once | ORAL | Status: AC
  Administered 2024-01-17: 650 mg via ORAL
  Filled 2024-01-17: qty 2

## 2024-01-17 MED ORDER — DIPHENHYDRAMINE HCL 25 MG PO CAPS
25.0000 mg | ORAL_CAPSULE | Freq: Once | ORAL | Status: AC
  Administered 2024-01-17: 25 mg via ORAL
  Filled 2024-01-17: qty 1

## 2024-01-17 MED ORDER — HEPARIN SOD (PORK) LOCK FLUSH 100 UNIT/ML IV SOLN
500.0000 [IU] | Freq: Once | INTRAVENOUS | Status: AC | PRN
  Administered 2024-01-17: 500 [IU]

## 2024-01-17 MED ORDER — SODIUM CHLORIDE 0.9 % IV SOLN: INTRAVENOUS | Status: DC

## 2024-01-17 MED ORDER — SODIUM CHLORIDE 0.9% FLUSH
10.0000 mL | INTRAVENOUS | Status: DC | PRN
Start: 2024-01-17 — End: 2024-01-17
  Administered 2024-01-17: 10 mL

## 2024-01-17 MED ORDER — TRASTUZUMAB CHEMO 150 MG IV SOLR
6.0000 mg/kg | Freq: Once | INTRAVENOUS | Status: AC
  Administered 2024-01-17: 378 mg via INTRAVENOUS
  Filled 2024-01-17: qty 18

## 2024-01-17 NOTE — Assessment & Plan Note (Signed)
 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  Adjuvant chemotherapy with Taxol and Herceptin followed by Herceptin maintenance completed 07/06/2022 2. breast radiation completed 01/04/2022 3.  Metastatic disease diagnosis:CT chest on 11/01/2022: Right upper lobe nodules largest measuring 5 mm, 01/13/23: Liver Biopsy: Met breast cancer ER 0%, PR 0%, Ki67 50%, HER2 3+  4.  Enhertu 02/07/2023-09/13/2023 --------------------------------------------------------------------------------------------------------------------- PET/CT 09/30/2023: Progression of liver metastasis (3.4 cm was 1.7 cm, 2.3 cm was 1.2 cm, additional liver lesions 1.2 cm and 1.3 cm), lung nodule 0.9 cm was 0.6 cm  Current treatment: Herceptin, Tucatinib, capecitabine started December 2024 Chemo toxicities: Slight rash on the hands left send chest: Stable Dizziness and fullness in the head: Accompanied with headaches: I recommended that we obtain a brain MRI stat.  I am concern for brain metastases. Reviewed labs with her in detail.    ECHO 11/25/2023: EF 50-55% Return to clinic every 3 weeks for Herceptin. CT CAP 01/15/2024: Liver lesions diminished in size (1.6 cm was 3.4 cm, 1.6 cm was 2.3 cm), diminished size of the right lower lobe lung nodule 0.6 cm was 0.9 cm  RTC in 3 weeks for labs, f/u, and treatment.

## 2024-01-17 NOTE — Progress Notes (Signed)
 Patient Care Team: Everrett Coombe, DO as PCP - General (Family Medicine) [name], MD as Consulting Physician (Radiation Oncology) Barbaraann Cao Georgeanna Lea, MD as Consulting Physician (Oncology) [facility] [place] (Op[facility]) Domenic Schwab, Lucianne Lei, MD as Referring Physician (Dermatology) Graylin Shiver, MD as Referring Physician (Otolaryngology) Serena Croissant, MD as Medical Oncologist (Hematology and Oncology) Harriette Bouillon, MD as Consulting Physician (General Surgery)  DIAGNOSIS:  Encounter Diagnosis  Name Primary?   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 No pathogenic variants identified on the [facility] Multi-Cancer Panel+RNA. VUS in POLD1 identified called c.2429C>T. The report date is 07/01/2021.  The Multi-Cancer  Panel + RNA offered by [facility] includes sequencing and/or deletion duplication testing of the following 84 genes: AIP, ALK, APC, ATM, AXIN2,BAP1,  BARD1, BLM, BMPR1A, BRCA1, BRCA2, BRIP1, CASR, CDC73, CDH1, CDK4, CDKN1B, CDKN1C, CDKN2A (p14ARF), CDKN2A (p16INK4a), CEBPA, CHEK2, CTNNA1, DICER1, DIS3L2, EGFR (c.2369C>T, p.Thr790Met variant only), EPCAM (Deletion/duplication testing only), FH, FLCN, GATA2, GPC3, GREM1 (Promoter region deletion/duplication testing only), HOXB13 (c.251G>A, p.Gly84Glu), HRAS, KIT, MAX, MEN1, MET, MITF (c.952G>A, p.Glu318Lys variant only), MLH1, MSH2, MSH3, MSH6, MUTYH, NBN, NF1, NF2, NTHL1, PALB2, PDGFRA, PHOX2B, PMS2, POLD1, POLE, POT1, PRKAR1A, PTCH1, PTEN, RAD50, RAD51C, RAD51D, RB1, RECQL4, RET, RUNX1, SDHAF2, SDHA (sequence changes only), SDHB, SDHC, SDHD, SMAD4, SMARCA4, SMARCB1, SMARCE1, STK11, SUFU, TERC, TERT, TMEM127, TP53, TSC1, TSC2, VHL, WRN and WT1.   06/18/2021 Cancer Staging   Staging form: Breast, AJCC 8th Edition - Pathologic stage from 06/18/2021: Stage IA (pT1c, pN0, cM0, G3, ER-, PR-, HER2+) - Signed by [name], NP on 10/05/2022 Stage prefix: Initial diagnosis Histologic grading system: 3 grade system   07/21/2021 - 07/06/2022 Chemotherapy   Patient is on Treatment Plan : BREAST Paclitaxel + Trastuzumab q7d / Trastuzumab q21d     11/25/2021 - 01/04/2022 Radiation Therapy   Site Technique Total Dose (Gy) Dose per Fx (Gy) Completed Fx Beam Energies  Breast, Right: Breast_R 3D 45/45 1.8 25/25 6XFFF  Breast, Right: Breast_R_Bst specialPort 5.4/5.4 1.8 3/3 12E     12/29/2022 Progression   CT chest on 11/01/2022: Right upper lobe nodules largest measuring 5 mm CT chest 12/29/2022: Upper and mid lung zone predominant pulmonary nodules are worrisome for metastatic disease.  Left and right hepatic lobe masses (2.5 cm and 3.9 cm) are new and are worrisome for metastatic  disease.   01/13/2023 Initial Biopsy   Right liver biopsy: Metastatic carcinoma  with breast primary, ER-, PR-, HER2 +, Ki-67 50%.    01/13/2023 Cancer Staging   Staging form: Breast, AJCC 8th Edition - Pathologic stage from 01/13/2023: Stage IV (pM1, G3, ER-, PR-, HER2+) - Signed by [name], NP on 02/07/2023 Histologic grading system: 3 grade system   01/17/2023 PET scan   PET CT scan 01/17/2023: Hypermetabolic bilobar hepatic (2 masses measuring 2.8 cm SUV 11.1, another mass 4.1 cm SUV 10.3) and pulmonary metastases (right upper lobe lung nodule 5 mm SUV 2.6 right lower lobe 6 mm nodule SUV 1.8)    02/07/2023 - 09/13/2023 Chemotherapy   Patient is on Treatment Plan : BREAST METASTATIC Fam-Trastuzumab Deruxtecan-nxki (Enhertu) (5.4) q21d     10/04/2023 -  Chemotherapy   Patient is on Treatment Plan : BREAST Trastuzumab (8/6) IV D1 + Capecitabine +  Tucatinib q21d       CHIEF COMPLIANT: Follow-up of metastatic breast cancer to review results of scans  HISTORY OF PRESENT ILLNESS:   History of Present Illness Krystelle, a patient with a history of cancer, presents for a follow-up visit. She reports unintentional weight loss due to a lack of appetite, stating that "nothing tastes, nothing looks good." She admits to having a poor diet recently, with a single day's intake consisting of "Cheerios and a pretzel." Despite this, she reports good news from her recent scans, which show significant shrinkage of her lung nodules and liver spots. This indicates that her current treatment plan is effective. She expresses confusion about her recent lab results, which she perceived as abnormal.     ALLERGIES:  has no known allergies.  MEDICATIONS:  Current Outpatient Medications  Medication Sig Dispense Refill   acetaminophen (TYLENOL) 500 MG tablet Take 500 mg by mouth as needed.     ALPRAZolam (XANAX) 0.25 MG tablet TAKE 1 TABLET BY MOUTH TWICE A DAY AS NEEDED FOR ANXIETY 30 tablet 3   capecitabine (XELODA) 500 MG tablet Take 2 tablets (1,000 mg total) by mouth 2 (two)  times daily before a meal. Take for 14 days on, 7 days off. Repeat every 21 days. 56 tablet 6   diazepam (VALIUM) 2 MG tablet Take by mouth as needed (dizziness).     diclofenac Sodium (VOLTAREN) 1 % GEL Research Patient: Apply 0.5 grams (1 fingertip) to each hand and each foot twice daily for up to 12 weeks 400 g 0   Famotidine-Ca Carb-Mag Hydrox (PEPCID COMPLETE PO) Take by mouth as needed.     lidocaine-prilocaine (EMLA) cream Apply to affected area once 30 g 3   methylPREDNISolone (MEDROL DOSEPAK) 4 MG TBPK tablet Take as prescribed 21 tablet 0   ondansetron (ZOFRAN) 8 MG tablet Take 1 tablet (8 mg total) by mouth every 8 (eight) hours as needed for nausea or vomiting. 30 tablet 1   prochlorperazine (COMPAZINE) 10 MG tablet Take 1 tablet (10 mg total) by mouth every 6 (six) hours as needed for nausea or vomiting. 30 tablet 1   triamcinolone ointment (KENALOG) 0.5 % Apply 1 Application topically 2 (two) times daily. 30 g 0   tucatinib (TUKYSA) 150 MG tablet Take 2 tablets (300 mg total) by mouth 2 (two) times daily. Take every 12 hrs at the same time each day with or without a meal. 120 tablet 3   No current facility-administered medications for this visit.    PHYSICAL EXAMINATION: ECOG PERFORMANCE STATUS: 1 - Symptomatic but  completely ambulatory  Vitals:   01/17/24 0834  BP: 121/66  Pulse: 78  Resp: 17  Temp: 98.3 F (36.8 C)  SpO2: 97%   Filed Weights   01/17/24 0834  Weight: 135 lb 9.6 oz (61.5 kg)      LABORATORY DATA:  I have reviewed the data as listed    Latest Ref Rng & Units 12/27/2023   10:20 AM 12/06/2023    8:45 AM 11/15/2023    8:10 AM  CMP  Glucose 70 - 99 mg/dL 93  87  98   BUN 6 - 20 mg/dL 10  9  14    Creatinine 0.44 - 1.00 mg/dL 1.61  0.96  0.45   Sodium 135 - 145 mmol/L 138  139  140   Potassium 3.5 - 5.1 mmol/L 4.2  3.6  3.6   Chloride 98 - 111 mmol/L 107  107  106   CO2 22 - 32 mmol/L 26  26  27    Calcium 8.9 - 10.3 mg/dL 8.7  9.0  9.1   Total  Protein 6.5 - 8.1 g/dL 6.5  6.4  6.6   Total Bilirubin 0.0 - 1.2 mg/dL 0.5  0.5  0.4   Alkaline Phos 38 - 126 U/L 118  125  125   AST 15 - 41 U/L 31  38  39   ALT 0 - 44 U/L 29  36  41     Lab Results  Component Value Date   WBC 6.8 12/27/2023   HGB 11.9 (L) 12/27/2023   HCT 36.2 12/27/2023   MCV 95.5 12/27/2023   PLT 171 12/27/2023   NEUTROABS 4.4 12/27/2023    ASSESSMENT & PLAN:  Malignant neoplasm of central portion of right breast in female, estrogen receptor negative (HCC) 06/18/2021:Right lumpectomy: 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  Adjuvant chemotherapy with Taxol and Herceptin followed by Herceptin maintenance completed 07/06/2022 2. breast radiation completed 01/04/2022 3.  Metastatic disease diagnosis:CT chest on 11/01/2022: Right upper lobe nodules largest measuring 5 mm, 01/13/23: Liver Biopsy: Met breast cancer ER 0%, PR 0%, Ki67 50%, HER2 3+  4.  Enhertu 02/07/2023-09/13/2023 --------------------------------------------------------------------------------------------------------------------- PET/CT 09/30/2023: Progression of liver metastasis (3.4 cm was 1.7 cm, 2.3 cm was 1.2 cm, additional liver lesions 1.2 cm and 1.3 cm), lung nodule 0.9 cm was 0.6 cm  Current treatment: Herceptin, Tucatinib, capecitabine started December 2024 Chemo toxicities: Slight rash on the hands left send chest: Stable Dizziness and fullness in the head: Accompanied with headaches: I recommended that we obtain a brain MRI stat.  I am concern for brain metastases. Reviewed labs with her in detail.    ECHO 11/25/2023: EF 50-55% Return to clinic every 3 weeks for Herceptin. CT CAP 01/15/2024: Liver lesions diminished in  size (1.6 cm was 3.4 cm, 1.6 cm was 2.3 cm), diminished size of the right lower lobe lung nodule 0.6 cm was 0.9 cm Echocardiograms will be done every 6 months from this point onwards. RTC in 3 weeks for labs, f/u, and treatment. ------------------------------------- Assessment and Plan Assessment & Plan Malignant neoplasm of central portion of right breast Undergoing treatment with significant shrinkage of lung nodules and liver lesions, indicating a positive response. Liver lesions reduced from  3.4 cm to 1.6 cm and 2.3 cm to 1.6 cm. Lab results show no significant abnormalities. Tumor markers are unreliable for this cancer type. Weight loss and decreased appetite may be treatment or cancer-related. - Continue Capecitabine and Tucatinib. - Encourage increased protein intake to prevent muscle loss. - Schedule echocardiograms every six months to monitor cardiac function. - Perform labs periodically to monitor overall health.   Meningioma Meningioma mentioned in medical history with no new symptoms or concerns.  Follows with Dr. Barbaraann Cao  Follow-up Scheduled for follow-up on February 07, 2024, and ready for next infusion treatment. - Schedule follow-up appointment on February 07, 2024. - Proceed with next infusion treatment as planned.      No orders of the defined types were placed in this encounter.  The patient has a good understanding of the overall plan. she agrees with it. she will call with any problems that may develop before the next visit here. Total time spent: 30 mins including face to face time and time spent for planning, charting and co-ordination of care   Tamsen Meek, MD 01/17/24

## 2024-01-17 NOTE — Patient Instructions (Signed)
 [facility] - A DEPT OF [facility]  Discharge Instructions: Thank you for choosing [facility] to provide your oncology and hematology care.   If you have a lab appointment with the Cancer Center, please go directly to the Cancer Center and check in at the registration area.   Wear comfortable clothing and clothing appropriate for easy access to any Portacath or PICC line.   We strive to give you quality time with your provider. You may need to reschedule your appointment if you arrive late (15 or more minutes).  Arriving late affects you and other patients whose appointments are after yours.  Also, if you miss three or more appointments without notifying the office, you may be dismissed from the clinic at the provider's discretion.      For prescription refill requests, have your pharmacy contact our office and allow 72 hours for refills to be completed.    Today you received the following chemotherapy and/or immunotherapy agents: Herceptin      To help prevent nausea and vomiting after your treatment, we encourage you to take your nausea medication as directed.  BELOW ARE SYMPTOMS THAT SHOULD BE REPORTED IMMEDIATELY: *FEVER GREATER THAN 100.4 F (38 C) OR HIGHER *CHILLS OR SWEATING *NAUSEA AND VOMITING THAT IS NOT CONTROLLED WITH YOUR NAUSEA MEDICATION *UNUSUAL SHORTNESS OF BREATH *UNUSUAL BRUISING OR BLEEDING *URINARY PROBLEMS (pain or burning when urinating, or frequent urination) *BOWEL PROBLEMS (unusual diarrhea, constipation, pain near the anus) TENDERNESS IN MOUTH AND THROAT WITH OR WITHOUT PRESENCE OF ULCERS (sore throat, sores in mouth, or a toothache) UNUSUAL RASH, SWELLING OR PAIN  UNUSUAL VAGINAL DISCHARGE OR ITCHING   Items with * indicate a potential emergency and should be followed up as soon as possible or go to the Emergency Department if any problems should occur.  Please show the CHEMOTHERAPY ALERT CARD or IMMUNOTHERAPY  ALERT CARD at check-in to the Emergency Department and triage nurse.  Should you have questions after your visit or need to cancel or reschedule your appointment, please contact [facility] - A DEPT OF [facility]  Dept: [phone]  and follow the prompts.  Office hours are 8:00 a.m. to 4:30 p.m. Monday - Friday. Please note that voicemails left after 4:00 p.m. may not be returned until the following business day.  We are closed weekends and major holidays. You have access to a nurse at all times for urgent questions. Please call the main number to the clinic Dept: [phone] and follow the prompts.   For any non-urgent questions, you may also contact your provider using MyChart. We now offer e-Visits for anyone 68 and older to request care online for non-urgent symptoms. For details visit [email or web address].   Also download the MyChart app! Go to the app store, search "MyChart", open the app, select , and log in with your MyChart username and password.

## 2024-01-17 NOTE — Telephone Encounter (Signed)
 Left vm about added appointment on 4/22 per 4/1 los.

## 2024-01-26 ENCOUNTER — Other Ambulatory Visit: Payer: Self-pay

## 2024-01-30 ENCOUNTER — Other Ambulatory Visit (HOSPITAL_COMMUNITY): Payer: Self-pay

## 2024-01-30 NOTE — Progress Notes (Signed)
 Specialty Pharmacy Refill Coordination Note  Lori Mcmillan is a 63 y.o. female contacted today regarding refills of specialty medication(s) Capecitabine (XELODA)   Patient requested [name] at [facility] [place] date: 02/07/24   Medication will be filled on 02/06/24.

## 2024-01-30 NOTE — Progress Notes (Signed)
 Specialty Pharmacy Ongoing Clinical Assessment Note  Lori Mcmillan is a 63 y.o. female who is being followed by the specialty pharmacy service for RxSp Oncology   Patient's specialty medication(s) reviewed today: Capecitabine (XELODA)   Missed doses in the last 4 weeks: 0   Patient/Caregiver did not have any additional questions or concerns.   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 Patient will maintain adherence         Follow up:  3 months  Malachi Screws Specialty Pharmacist

## 2024-02-06 ENCOUNTER — Other Ambulatory Visit: Payer: Self-pay

## 2024-02-07 ENCOUNTER — Inpatient Hospital Stay: Payer: 59

## 2024-02-07 ENCOUNTER — Inpatient Hospital Stay (HOSPITAL_BASED_OUTPATIENT_CLINIC_OR_DEPARTMENT_OTHER): Payer: 59 | Admitting: Hematology and Oncology

## 2024-02-07 ENCOUNTER — Other Ambulatory Visit (HOSPITAL_COMMUNITY): Payer: Self-pay

## 2024-02-07 ENCOUNTER — Other Ambulatory Visit: Payer: Self-pay

## 2024-02-07 ENCOUNTER — Inpatient Hospital Stay

## 2024-02-07 VITALS — BP 117/76 | HR 78 | Temp 97.2°F | Resp 17 | Ht 63.0 in | Wt 133.7 lb

## 2024-02-07 DIAGNOSIS — C50111 Malignant neoplasm of central portion of right female breast: Secondary | ICD-10-CM

## 2024-02-07 DIAGNOSIS — Z171 Estrogen receptor negative status [ER-]: Secondary | ICD-10-CM

## 2024-02-07 DIAGNOSIS — C787 Secondary malignant neoplasm of liver and intrahepatic bile duct: Secondary | ICD-10-CM | POA: Diagnosis not present

## 2024-02-07 DIAGNOSIS — Z5112 Encounter for antineoplastic immunotherapy: Secondary | ICD-10-CM | POA: Diagnosis not present

## 2024-02-07 DIAGNOSIS — Z95828 Presence of other vascular implants and grafts: Secondary | ICD-10-CM

## 2024-02-07 DIAGNOSIS — C7801 Secondary malignant neoplasm of right lung: Secondary | ICD-10-CM | POA: Diagnosis not present

## 2024-02-07 DIAGNOSIS — D329 Benign neoplasm of meninges, unspecified: Secondary | ICD-10-CM | POA: Diagnosis not present

## 2024-02-07 LAB — CBC WITH DIFFERENTIAL (CANCER CENTER ONLY)
Abs Immature Granulocytes: 0.01 10*3/uL (ref 0.00–0.07)
Basophils Absolute: 0 10*3/uL (ref 0.0–0.1)
Basophils Relative: 1 %
Eosinophils Absolute: 0.2 10*3/uL (ref 0.0–0.5)
Eosinophils Relative: 3 %
HCT: 34.7 % — ABNORMAL LOW (ref 36.0–46.0)
Hemoglobin: 11.9 g/dL — ABNORMAL LOW (ref 12.0–15.0)
Immature Granulocytes: 0 %
Lymphocytes Relative: 24 %
Lymphs Abs: 1.5 10*3/uL (ref 0.7–4.0)
MCH: 33 pg (ref 26.0–34.0)
MCHC: 34.3 g/dL (ref 30.0–36.0)
MCV: 96.1 fL (ref 80.0–100.0)
Monocytes Absolute: 0.6 10*3/uL (ref 0.1–1.0)
Monocytes Relative: 9 %
Neutro Abs: 4.1 10*3/uL (ref 1.7–7.7)
Neutrophils Relative %: 63 %
Platelet Count: 188 10*3/uL (ref 150–400)
RBC: 3.61 MIL/uL — ABNORMAL LOW (ref 3.87–5.11)
RDW: 17.1 % — ABNORMAL HIGH (ref 11.5–15.5)
WBC Count: 6.4 10*3/uL (ref 4.0–10.5)
nRBC: 0 % (ref 0.0–0.2)

## 2024-02-07 LAB — CMP (CANCER CENTER ONLY)
ALT: 21 U/L (ref 0–44)
AST: 23 U/L (ref 15–41)
Albumin: 4.3 g/dL (ref 3.5–5.0)
Alkaline Phosphatase: 117 U/L (ref 38–126)
Anion gap: 8 (ref 5–15)
BUN: 15 mg/dL (ref 6–20)
CO2: 24 mmol/L (ref 22–32)
Calcium: 9 mg/dL (ref 8.9–10.3)
Chloride: 108 mmol/L (ref 98–111)
Creatinine: 0.89 mg/dL (ref 0.44–1.00)
GFR, Estimated: >60 mL/min (ref >60)
Glucose, Bld: 91 mg/dL (ref 70–99)
Potassium: 3.8 mmol/L (ref 3.5–5.1)
Sodium: 140 mmol/L (ref 135–145)
Total Bilirubin: 0.6 mg/dL (ref 0.0–1.2)
Total Protein: 6.5 g/dL (ref 6.5–8.1)

## 2024-02-07 MED ORDER — SODIUM CHLORIDE 0.9 % IV SOLN
6.0000 mg/kg | Freq: Once | INTRAVENOUS | Status: AC
  Administered 2024-02-07: 378 mg via INTRAVENOUS
  Filled 2024-02-07: qty 18

## 2024-02-07 MED ORDER — SODIUM CHLORIDE 0.9% FLUSH
10.0000 mL | Freq: Once | INTRAVENOUS | Status: AC
  Administered 2024-02-07: 10 mL

## 2024-02-07 MED ORDER — ACETAMINOPHEN 325 MG PO TABS
650.0000 mg | ORAL_TABLET | Freq: Once | ORAL | Status: AC
  Administered 2024-02-07: 650 mg via ORAL

## 2024-02-07 MED ORDER — DIPHENHYDRAMINE HCL 25 MG PO CAPS
25.0000 mg | ORAL_CAPSULE | Freq: Once | ORAL | Status: AC
  Administered 2024-02-07: 25 mg via ORAL

## 2024-02-07 MED ORDER — SODIUM CHLORIDE 0.9 % IV SOLN
INTRAVENOUS | Status: DC
Start: 2024-02-07 — End: 2024-02-07

## 2024-02-07 MED ORDER — CAPECITABINE 500 MG PO TABS
1000.0000 mg | ORAL_TABLET | ORAL | 6 refills | Status: DC
  Filled 2024-02-07: qty 42, fill #0
  Filled 2024-02-13: qty 42, 21d supply, fill #0

## 2024-02-07 NOTE — Assessment & Plan Note (Signed)
 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  Adjuvant chemotherapy with Taxol  and Herceptin  followed by Herceptin  maintenance completed 07/06/2022 2. breast radiation completed 01/04/2022 3.  Metastatic disease diagnosis:CT chest on 11/01/2022: Right upper lobe nodules largest measuring 5 mm, 01/13/23: Liver Biopsy: Met breast cancer ER 0%, PR 0%, Ki67 50%, HER2 3+  4.  Enhertu  02/07/2023-09/13/2023 --------------------------------------------------------------------------------------------------------------------- PET/CT 09/30/2023: Progression of liver metastasis (3.4 cm was 1.7 cm, 2.3 cm was 1.2 cm, additional liver lesions 1.2 cm and 1.3 cm), lung nodule 0.9 cm was 0.6 cm  Current treatment: Herceptin , Tucatinib , capecitabine  started December 2024 Chemo toxicities: Slight rash on the hands left send chest: Stable Dizziness and fullness in the head: Accompanied with headaches: I recommended that we obtain a brain MRI stat.  I am concern for brain metastases. Reviewed labs with her in detail.    ECHO 11/25/2023: EF 50-55% Return to clinic every 3 weeks for Herceptin . CT CAP 01/15/2024: Liver lesions diminished in size (1.6 cm was 3.4 cm, 1.6 cm was 2.3 cm), diminished size of the right lower lobe lung nodule 0.6 cm was 0.9 cm Echocardiograms will be done every 6 months from this point onwards. RTC in 3 weeks for labs, f/u, and treatment.

## 2024-02-07 NOTE — Patient Instructions (Signed)
 [facility] - A DEPT OF [facility]  Discharge Instructions: Thank you for choosing [facility] to provide your oncology and hematology care.   If you have a lab appointment with the Cancer Center, please go directly to the Cancer Center and check in at the registration area.   Wear comfortable clothing and clothing appropriate for easy access to any Portacath or PICC line.   We strive to give you quality time with your provider. You may need to reschedule your appointment if you arrive late (15 or more minutes).  Arriving late affects you and other patients whose appointments are after yours.  Also, if you miss three or more appointments without notifying the office, you may be dismissed from the clinic at the provider's discretion.      For prescription refill requests, have your pharmacy contact our office and allow 72 hours for refills to be completed.    Today you received the following chemotherapy and/or immunotherapy agents: Herceptin      To help prevent nausea and vomiting after your treatment, we encourage you to take your nausea medication as directed.  BELOW ARE SYMPTOMS THAT SHOULD BE REPORTED IMMEDIATELY: *FEVER GREATER THAN 100.4 F (38 C) OR HIGHER *CHILLS OR SWEATING *NAUSEA AND VOMITING THAT IS NOT CONTROLLED WITH YOUR NAUSEA MEDICATION *UNUSUAL SHORTNESS OF BREATH *UNUSUAL BRUISING OR BLEEDING *URINARY PROBLEMS (pain or burning when urinating, or frequent urination) *BOWEL PROBLEMS (unusual diarrhea, constipation, pain near the anus) TENDERNESS IN MOUTH AND THROAT WITH OR WITHOUT PRESENCE OF ULCERS (sore throat, sores in mouth, or a toothache) UNUSUAL RASH, SWELLING OR PAIN  UNUSUAL VAGINAL DISCHARGE OR ITCHING   Items with * indicate a potential emergency and should be followed up as soon as possible or go to the Emergency Department if any problems should occur.  Please show the CHEMOTHERAPY ALERT CARD or IMMUNOTHERAPY  ALERT CARD at check-in to the Emergency Department and triage nurse.  Should you have questions after your visit or need to cancel or reschedule your appointment, please contact [facility] - A DEPT OF [facility]  Dept: [phone]  and follow the prompts.  Office hours are 8:00 a.m. to 4:30 p.m. Monday - Friday. Please note that voicemails left after 4:00 p.m. may not be returned until the following business day.  We are closed weekends and major holidays. You have access to a nurse at all times for urgent questions. Please call the main number to the clinic Dept: [phone] and follow the prompts.   For any non-urgent questions, you may also contact your provider using MyChart. We now offer e-Visits for anyone 68 and older to request care online for non-urgent symptoms. For details visit [email or web address].   Also download the MyChart app! Go to the app store, search "MyChart", open the app, select , and log in with your MyChart username and password.

## 2024-02-07 NOTE — Progress Notes (Signed)
 Patient Care Team: Lori Holter, DO as PCP - General (Family Medicine) Colie Dawes, MD as Consulting Physician (Radiation Oncology) Mark Sil Cranford Dixons, MD as Consulting Physician (Oncology) [facility] [place] ([facility]) Bluford Burkitt, Mitzi Ancona, MD as Referring Physician (Dermatology) Eldon Greenland, MD as Referring Physician (Otolaryngology) Cameron Cea, MD as Medical Oncologist (Hematology and Oncology) Sim Dryer, MD as Consulting Physician (General Surgery)  DIAGNOSIS:  Encounter Diagnosis  Name Primary?   Malignant neoplasm of central portion of right breast in female, estrogen receptor negative (HCC) Yes    SUMMARY OF ONCOLOGIC HISTORY: Oncology History  Malignant neoplasm of central portion of right breast in female, estrogen receptor negative (HCC)  01/04/2012 Initial Diagnosis   Right breast cancer: Stage Ia ER/PR positive HER2 negative status postlumpectomy, radiation (low risk Oncotype) could not tolerate tamoxifen  for more than 30 days.   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 No pathogenic variants identified on the [facility] Multi-Cancer Panel+RNA. VUS in POLD1 identified called c.2429C>T. The report date is 07/01/2021.  The Multi-Cancer  Panel + RNA offered by [facility] includes sequencing and/or deletion duplication testing of the following 84 genes: AIP, ALK, APC, ATM, AXIN2,BAP1,  BARD1, BLM, BMPR1A, BRCA1, BRCA2, BRIP1, CASR, CDC73, CDH1, CDK4, CDKN1B, CDKN1C, CDKN2A (p14ARF), CDKN2A (p16INK4a), CEBPA, CHEK2, CTNNA1, DICER1, DIS3L2, EGFR (c.2369C>T, p.Thr790Met variant only), EPCAM (Deletion/duplication testing only), FH, FLCN, GATA2, GPC3, GREM1 (Promoter region deletion/duplication testing only), HOXB13 (c.251G>A, p.Gly84Glu), HRAS, KIT, MAX, MEN1, MET, MITF (c.952G>A, p.Glu318Lys variant only), MLH1, MSH2, MSH3, MSH6, MUTYH, NBN, NF1, NF2, NTHL1, PALB2, PDGFRA, PHOX2B, PMS2, POLD1, POLE, POT1, PRKAR1A, PTCH1, PTEN, RAD50, RAD51C, RAD51D, RB1, RECQL4, RET, RUNX1, SDHAF2, SDHA (sequence changes only), SDHB, SDHC, SDHD, SMAD4, SMARCA4, SMARCB1, SMARCE1, STK11, SUFU, TERC, TERT, TMEM127, TP53, TSC1, TSC2, VHL, WRN and WT1.   06/18/2021 Cancer Staging   Staging form: Breast, AJCC 8th Edition - Pathologic stage from 06/18/2021: Stage IA (pT1c, pN0, cM0, G3, ER-, PR-, HER2+) - Signed by Percival Brace, NP on 10/05/2022 Stage prefix: Initial diagnosis Histologic grading system: 3 grade system   07/21/2021 - 07/06/2022 Chemotherapy   Patient is on Treatment Plan : BREAST Paclitaxel  + Trastuzumab  q7d / Trastuzumab  q21d     11/25/2021 - 01/04/2022 Radiation Therapy   Site Technique Total Dose (Gy) Dose per Fx (Gy) Completed Fx Beam Energies  Breast, Right: Breast_R 3D 45/45 1.8 25/25 6XFFF  Breast, Right: Breast_R_Bst specialPort 5.4/5.4 1.8 3/3 12E     12/29/2022 Progression   CT chest on 11/01/2022: Right upper lobe nodules largest measuring 5 mm CT chest 12/29/2022: Upper and mid lung zone predominant pulmonary nodules are worrisome for metastatic disease.  Left and right hepatic lobe masses (2.5 cm and 3.9 cm) are new and are worrisome for metastatic  disease.   01/13/2023 Initial Biopsy   Right liver biopsy: Metastatic carcinoma  with breast primary, ER-, PR-, HER2 +, Ki-67 50%.    01/13/2023 Cancer Staging   Staging form: Breast, AJCC 8th Edition - Pathologic stage from 01/13/2023: Stage IV (pM1, G3, ER-, PR-, HER2+) - Signed by Percival Brace, NP on 02/07/2023 Histologic grading system: 3 grade system   01/17/2023 PET scan   PET CT scan 01/17/2023: Hypermetabolic bilobar hepatic (2 masses measuring 2.8 cm SUV 11.1, another mass 4.1 cm SUV 10.3) and pulmonary metastases (right upper lobe lung nodule 5 mm SUV 2.6 right lower lobe 6 mm nodule SUV 1.8)    02/07/2023 - 09/13/2023 Chemotherapy   Patient is on Treatment Plan : BREAST METASTATIC Fam-Trastuzumab Deruxtecan-nxki  (Enhertu ) (5.4) q21d     10/04/2023 -  Chemotherapy   Patient is on Treatment Plan : BREAST Trastuzumab  (8/6) IV D1 + Capecitabine  +  Tucatinib  q21d       CHIEF COMPLIANT: Follow-up on capecitabine  Herceptin  and Tucatinib , complaining of rash on the hands  HISTORY OF PRESENT ILLNESS:  History of Present Illness The patient, with a history of cancer, presents with skin reactions, including redness and splitting skin, particularly on the hands, while on Xeloda . She has been trying to manage the skin reactions with hydration and lotions, including products from [employer] and [employer]. The skin reactions seem to calm down during the seven-day window when she is off Xeloda . She also reports a soreness in the chest area, which she believes may have been aggravated by physical exertion. The soreness has been improving with soaking in Epsom salt. She denies any other new symptoms.     ALLERGIES:  has no known allergies.  MEDICATIONS:  Current Outpatient Medications  Medication Sig Dispense Refill   acetaminophen  (TYLENOL ) 500 MG tablet Take 500 mg by mouth as needed.     ALPRAZolam  (XANAX ) 0.25 MG tablet TAKE 1 TABLET BY MOUTH TWICE A DAY AS NEEDED FOR ANXIETY 30 tablet 3   capecitabine  (XELODA ) 500 MG tablet Take 2 tablets (1,000 mg total)  by mouth 2 (two) times daily before a meal. Take for 14 days on, 7 days off. Repeat every 21 days. 56 tablet 6   diazepam (VALIUM) 2 MG tablet Take by mouth as needed (dizziness).     diclofenac  Sodium (VOLTAREN ) 1 % [employer] Patient: Apply 0.5 grams (1 fingertip) to each hand and each foot twice daily for up to 12 weeks 400 g 0   Famotidine -Ca Carb-Mag Hydrox (PEPCID  COMPLETE PO) Take by mouth as needed.     lidocaine -prilocaine  (EMLA ) cream Apply to affected area once 30 g 3   methylPREDNISolone  (MEDROL  DOSEPAK) 4 MG TBPK tablet Take as prescribed 21 tablet 0   ondansetron  (ZOFRAN ) 8 MG tablet Take 1 tablet (8 mg total) by mouth every 8 (eight) hours as needed for nausea or vomiting. (Patient not taking: Reported on 02/07/2024) 30 tablet 1   prochlorperazine  (COMPAZINE ) 10 MG tablet Take 1 tablet (10 mg total) by mouth every 6 (six) hours as needed for nausea or vomiting. (Patient not taking: Reported on 02/07/2024) 30 tablet 1   triamcinolone  ointment (KENALOG ) 0.5 % Apply 1 Application topically 2 (two) times daily. 30 g 0   tucatinib  (TUKYSA ) 150 MG tablet Take 2 tablets (300 mg total) by mouth 2 (two) times daily. Take every 12 hrs at the same time each day with or without a meal. 120 tablet 3   No current facility-administered medications for  this visit.    PHYSICAL EXAMINATION: ECOG PERFORMANCE STATUS: 1 - Symptomatic but completely ambulatory  Vitals:   02/07/24 0826  BP: 117/76  Pulse: 78  Resp: 17  Temp: (!) 97.2 F (36.2 C)  SpO2: 99%   Filed Weights   02/07/24 0826  Weight: 133 lb 11.2 oz (60.6 kg)    Physical Exam SKIN: Erythema with fissuring, predominantly on hands and face.  (exam performed in the presence of a chaperone)  LABORATORY DATA:  I have reviewed the data as listed    Latest Ref Rng & Units 12/27/2023   10:20 AM 12/06/2023    8:45 AM 11/15/2023    8:10 AM  CMP  Glucose 70 - 99 mg/dL 93  87  98   BUN 6 - 20 mg/dL 10  9  14    Creatinine 0.44 - 1.00  mg/dL 1.61  0.96  0.45   Sodium 135 - 145 mmol/L 138  139  140   Potassium 3.5 - 5.1 mmol/L 4.2  3.6  3.6   Chloride 98 - 111 mmol/L 107  107  106   CO2 22 - 32 mmol/L 26  26  27    Calcium 8.9 - 10.3 mg/dL 8.7  9.0  9.1   Total Protein 6.5 - 8.1 g/dL 6.5  6.4  6.6   Total Bilirubin 0.0 - 1.2 mg/dL 0.5  0.5  0.4   Alkaline Phos 38 - 126 U/L 118  125  125   AST 15 - 41 U/L 31  38  39   ALT 0 - 44 U/L 29  36  41     Lab Results  Component Value Date   WBC 6.4 02/07/2024   HGB 11.9 (L) 02/07/2024   HCT 34.7 (L) 02/07/2024   MCV 96.1 02/07/2024   PLT 188 02/07/2024   NEUTROABS 4.1 02/07/2024    ASSESSMENT & PLAN:  Malignant neoplasm of central portion of right breast in female, estrogen receptor negative (HCC) 06/18/2021:Right lumpectomy: 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  Adjuvant chemotherapy with Taxol  and Herceptin  followed by Herceptin  maintenance completed 07/06/2022 2. breast radiation completed 01/04/2022 3.  Metastatic disease diagnosis:CT chest on 11/01/2022: Right upper lobe nodules largest measuring 5 mm, 01/13/23: Liver Biopsy: Met breast cancer ER 0%, PR 0%, Ki67 50%, HER2 3+  4.  Enhertu  02/07/2023-09/13/2023 --------------------------------------------------------------------------------------------------------------------- PET/CT 09/30/2023: Progression of liver metastasis (3.4 cm was 1.7 cm, 2.3 cm was 1.2 cm, additional liver lesions 1.2 cm and 1.3 cm), lung nodule 0.9 cm was 0.6 cm  Current treatment: Herceptin , Tucatinib , capecitabine  started December 2024 Chemo toxicities: Slight rash on the hands left send chest: Reduce the dosage of Xeloda  to 1000 mg in the morning and 500 mg in the  evening.  I sent a new prescription to her pharmacy.     ECHO 11/25/2023: EF 50-55% CT CAP 01/15/2024: Liver lesions diminished in size (1.6 cm was 3.4 cm, 1.6 cm was 2.3 cm), diminished size of the right lower lobe lung nodule 0.6 cm was 0.9 cm  Echocardiograms will be done every 6 months from this point onwards. RTC in 3 weeks for labs, f/u, and treatment. ------------------------------------- Assessment and Plan Assessment & Plan Malignant neoplasm of central  portion of right breast Undergoing capecitabine  and tucatinib  treatment. CT scan stable. Chest soreness likely from activity, improving with Epsom salt soaks. Labs normal, calcium improved. Long-term treatment to maintain efficacy and manage side effects. - Continue capecitabine  and tucatinib  regimen. - Monitor symptoms and report significant changes. - Adjust lab work schedule to every other visit. - Streamline appointments to focus on treatment sessions.  Adverse effect of capecitabine  Experiencing skin reactions, improving during medication off period. Adjusted dosage to reduce toxicity while maintaining efficacy. Emphasized managing side effects for effective treatment. - Adjust capecitabine  dosage to 2 tablets in the morning and 1 tablet in the evening. - Send updated prescription to pharmacy. - Use moisturizing lotions for skin symptoms. - Monitor skin condition and report worsening symptoms.      No orders of the defined types were placed in this encounter.  The patient has a good understanding of the overall plan. she agrees with it. she will call with any problems that may develop before the next visit here. Total time spent: 30 mins including face to face time and time spent for planning, charting and co-ordination of care   Margert Sheerer, MD 02/07/24

## 2024-02-08 ENCOUNTER — Telehealth: Payer: Self-pay | Admitting: Hematology and Oncology

## 2024-02-08 ENCOUNTER — Other Ambulatory Visit: Payer: Self-pay

## 2024-02-08 LAB — CANCER ANTIGEN 27.29: CA 27.29: <9 U/mL (ref 0.0–38.6)

## 2024-02-08 NOTE — Telephone Encounter (Signed)
 Left vm about updated appt

## 2024-02-13 ENCOUNTER — Other Ambulatory Visit (HOSPITAL_COMMUNITY): Payer: Self-pay

## 2024-02-13 ENCOUNTER — Other Ambulatory Visit: Payer: Self-pay

## 2024-02-13 ENCOUNTER — Other Ambulatory Visit: Payer: Self-pay | Admitting: Hematology and Oncology

## 2024-02-13 ENCOUNTER — Other Ambulatory Visit: Payer: Self-pay | Admitting: Pharmacist

## 2024-02-13 ENCOUNTER — Other Ambulatory Visit: Payer: Self-pay | Admitting: Pharmacy Technician

## 2024-02-13 DIAGNOSIS — C50111 Malignant neoplasm of central portion of right female breast: Secondary | ICD-10-CM

## 2024-02-13 MED ORDER — CAPECITABINE 500 MG PO TABS
ORAL_TABLET | ORAL | 6 refills | Status: DC
  Filled 2024-02-13: qty 42, 21d supply, fill #0

## 2024-02-13 MED ORDER — TUCATINIB 150 MG PO TABS
300.0000 mg | ORAL_TABLET | Freq: Two times a day (BID) | ORAL | 3 refills | Status: DC
  Filled 2024-02-13: qty 120, 30d supply, fill #0
  Filled 2024-03-13: qty 120, 30d supply, fill #1
  Filled 2024-04-09: qty 120, 30d supply, fill #2
  Filled 2024-05-09: qty 120, 30d supply, fill #3

## 2024-02-13 NOTE — Progress Notes (Signed)
 Specialty Pharmacy Refill Coordination Note  Lori Mcmillan is a 63 y.o. female contacted today regarding refills of specialty medication(s) Capecitabine  (XELODA ); Tucatinib  (TUKYSA )   Patient requested Pickup at [facility] at [place] date: 02/20/24   Medication will be filled on 02/17/24.   Tukysa  (RR sent to MD) PPU 5/5 & Capecitabine  PPU 5/12 or after (start on 5/13) Cycle ends on 5/7.   This fill date is pending response to refill request from provider. Patient is aware and if they have not received fill by intended date they must follow up with pharmacy.

## 2024-02-17 ENCOUNTER — Other Ambulatory Visit: Payer: Self-pay

## 2024-02-22 ENCOUNTER — Other Ambulatory Visit (HOSPITAL_COMMUNITY): Payer: Self-pay

## 2024-02-28 ENCOUNTER — Inpatient Hospital Stay: Payer: 59 | Attending: Hematology and Oncology

## 2024-02-28 ENCOUNTER — Other Ambulatory Visit: Payer: Self-pay | Admitting: *Deleted

## 2024-02-28 ENCOUNTER — Ambulatory Visit: Payer: 59 | Admitting: Hematology and Oncology

## 2024-02-28 VITALS — BP 115/64 | HR 65 | Temp 97.9°F | Resp 20 | Wt 133.0 lb

## 2024-02-28 DIAGNOSIS — Z171 Estrogen receptor negative status [ER-]: Secondary | ICD-10-CM | POA: Diagnosis not present

## 2024-02-28 DIAGNOSIS — Z1722 Progesterone receptor negative status: Secondary | ICD-10-CM | POA: Diagnosis not present

## 2024-02-28 DIAGNOSIS — C787 Secondary malignant neoplasm of liver and intrahepatic bile duct: Secondary | ICD-10-CM | POA: Insufficient documentation

## 2024-02-28 DIAGNOSIS — Z5112 Encounter for antineoplastic immunotherapy: Secondary | ICD-10-CM | POA: Insufficient documentation

## 2024-02-28 DIAGNOSIS — C50111 Malignant neoplasm of central portion of right female breast: Secondary | ICD-10-CM | POA: Diagnosis not present

## 2024-02-28 DIAGNOSIS — Z1731 Human epidermal growth factor receptor 2 positive status: Secondary | ICD-10-CM | POA: Insufficient documentation

## 2024-02-28 DIAGNOSIS — Z5181 Encounter for therapeutic drug level monitoring: Secondary | ICD-10-CM

## 2024-02-28 MED ORDER — DIPHENHYDRAMINE HCL 25 MG PO CAPS
25.0000 mg | ORAL_CAPSULE | Freq: Once | ORAL | Status: AC
  Administered 2024-02-28: 25 mg via ORAL
  Filled 2024-02-28: qty 1

## 2024-02-28 MED ORDER — SODIUM CHLORIDE 0.9% FLUSH
10.0000 mL | INTRAVENOUS | Status: DC | PRN
Start: 2024-02-28 — End: 2024-02-28
  Administered 2024-02-28: 10 mL

## 2024-02-28 MED ORDER — TRASTUZUMAB CHEMO 150 MG IV SOLR
6.0000 mg/kg | Freq: Once | INTRAVENOUS | Status: AC
  Administered 2024-02-28: 378 mg via INTRAVENOUS
  Filled 2024-02-28: qty 18

## 2024-02-28 MED ORDER — HEPARIN SOD (PORK) LOCK FLUSH 100 UNIT/ML IV SOLN
500.0000 [IU] | Freq: Once | INTRAVENOUS | Status: AC | PRN
  Administered 2024-02-28: 500 [IU]

## 2024-02-28 MED ORDER — ACETAMINOPHEN 325 MG PO TABS
650.0000 mg | ORAL_TABLET | Freq: Once | ORAL | Status: AC
  Administered 2024-02-28: 650 mg via ORAL
  Filled 2024-02-28: qty 2

## 2024-02-28 MED ORDER — SODIUM CHLORIDE 0.9 % IV SOLN: INTRAVENOUS | Status: DC

## 2024-02-28 NOTE — Progress Notes (Signed)
 Per MD okay to treat with Echo from 11/25/23.  Verbal orders received and placed to obtain repeat echo prior to next tx.

## 2024-03-06 ENCOUNTER — Other Ambulatory Visit: Payer: Self-pay

## 2024-03-08 ENCOUNTER — Telehealth: Payer: Self-pay | Admitting: Hematology and Oncology

## 2024-03-08 ENCOUNTER — Other Ambulatory Visit: Payer: Self-pay

## 2024-03-08 ENCOUNTER — Other Ambulatory Visit (HOSPITAL_COMMUNITY): Payer: Self-pay

## 2024-03-08 ENCOUNTER — Other Ambulatory Visit: Payer: Self-pay | Admitting: Hematology and Oncology

## 2024-03-08 ENCOUNTER — Telehealth: Payer: Self-pay | Admitting: *Deleted

## 2024-03-08 DIAGNOSIS — C50111 Malignant neoplasm of central portion of right female breast: Secondary | ICD-10-CM

## 2024-03-08 MED ORDER — CAPECITABINE 500 MG PO TABS
ORAL_TABLET | ORAL | 0 refills | Status: DC
  Filled 2024-03-08: qty 14, 4d supply, fill #0

## 2024-03-08 NOTE — Progress Notes (Signed)
 Short fill of xeloda  received to complete current cycle. Patient will pickup 5.27

## 2024-03-08 NOTE — Telephone Encounter (Signed)
 Received call from pt stating she has been taking Xeloda  500 mg- 2 tablets in the AM and 2 tablets in the PM instead of 2 tablets in the AM and 1 tablet in the PM per MD request during last visit.  Pt states she will now be short 14 tablets.  RN reviewed with MD and MD sent short order refill to pharmacy on file. Pt notified and verbalized understanding.   Pt also states she has not been scheduled yet for 03/20/24 infusion. RN sent message to scheduling team.

## 2024-03-08 NOTE — Telephone Encounter (Signed)
 Left vm sbout scheduled appt date and time

## 2024-03-09 ENCOUNTER — Other Ambulatory Visit: Payer: Self-pay

## 2024-03-13 ENCOUNTER — Other Ambulatory Visit: Payer: Self-pay | Admitting: Hematology and Oncology

## 2024-03-13 ENCOUNTER — Other Ambulatory Visit: Payer: Self-pay

## 2024-03-13 ENCOUNTER — Other Ambulatory Visit: Payer: Self-pay | Admitting: Pharmacy Technician

## 2024-03-13 DIAGNOSIS — Z171 Estrogen receptor negative status [ER-]: Secondary | ICD-10-CM

## 2024-03-13 NOTE — Progress Notes (Signed)
 Specialty Pharmacy Refill Coordination Note  Lori Mcmillan is a 63 y.o. female contacted today regarding refills of specialty medication(s)   Capecitabine  (XELODA ); Tucatinib  (TUKYSA )    Patient requested (Patient-Rptd) Pickup at [facility] date: (Patient-Rptd) 03/20/24   Medication will be filled on 03/19/24.   RR sent to MD for Xeloda 

## 2024-03-14 ENCOUNTER — Other Ambulatory Visit: Payer: Self-pay

## 2024-03-14 ENCOUNTER — Encounter: Payer: Self-pay | Admitting: Hematology and Oncology

## 2024-03-19 ENCOUNTER — Other Ambulatory Visit: Payer: Self-pay | Admitting: Pharmacist

## 2024-03-19 ENCOUNTER — Other Ambulatory Visit: Payer: Self-pay

## 2024-03-19 ENCOUNTER — Ambulatory Visit (HOSPITAL_COMMUNITY)
Admission: RE | Admit: 2024-03-19 | Discharge: 2024-03-19 | Disposition: A | Discharge status: Home / Self Care | Source: Ambulatory Visit | Attending: Hematology and Oncology | Admitting: Hematology and Oncology

## 2024-03-19 DIAGNOSIS — Z5181 Encounter for therapeutic drug level monitoring: Secondary | ICD-10-CM | POA: Insufficient documentation

## 2024-03-19 DIAGNOSIS — C50111 Malignant neoplasm of central portion of right female breast: Secondary | ICD-10-CM

## 2024-03-19 DIAGNOSIS — Z79899 Other long term (current) drug therapy: Secondary | ICD-10-CM | POA: Insufficient documentation

## 2024-03-19 LAB — ECHOCARDIOGRAM COMPLETE
AR max vel: 2.57 cm2
AV Area VTI: 2.6 cm2
AV Area mean vel: 2.54 cm2
AV Mean grad: 4 mmHg
AV Peak grad: 7 mmHg
Ao pk vel: 1.32 m/s
Area-P 1/2: 3.48 cm2
S' Lateral: 2.9 cm

## 2024-03-19 MED ORDER — CAPECITABINE 500 MG PO TABS
ORAL_TABLET | ORAL | 3 refills | Status: DC
  Filled 2024-03-19: qty 42, 21d supply, fill #0
  Filled 2024-04-03: qty 42, 21d supply, fill #1
  Filled 2024-04-23 – 2024-04-26 (×2): qty 42, 21d supply, fill #2

## 2024-03-20 ENCOUNTER — Inpatient Hospital Stay

## 2024-03-20 ENCOUNTER — Inpatient Hospital Stay: Attending: Hematology and Oncology | Admitting: Hematology and Oncology

## 2024-03-20 VITALS — BP 119/75 | HR 74 | Temp 97.6°F | Resp 15 | Wt 133.2 lb

## 2024-03-20 VITALS — BP 138/65 | HR 64 | Resp 16

## 2024-03-20 DIAGNOSIS — C50111 Malignant neoplasm of central portion of right female breast: Secondary | ICD-10-CM | POA: Insufficient documentation

## 2024-03-20 DIAGNOSIS — Z170 Estrogen receptor positive status [ER+]: Secondary | ICD-10-CM | POA: Insufficient documentation

## 2024-03-20 DIAGNOSIS — Z1722 Progesterone receptor negative status: Secondary | ICD-10-CM | POA: Insufficient documentation

## 2024-03-20 DIAGNOSIS — Z923 Personal history of irradiation: Secondary | ICD-10-CM | POA: Insufficient documentation

## 2024-03-20 DIAGNOSIS — Z171 Estrogen receptor negative status [ER-]: Secondary | ICD-10-CM | POA: Diagnosis not present

## 2024-03-20 DIAGNOSIS — C787 Secondary malignant neoplasm of liver and intrahepatic bile duct: Secondary | ICD-10-CM | POA: Insufficient documentation

## 2024-03-20 DIAGNOSIS — Z79899 Other long term (current) drug therapy: Secondary | ICD-10-CM | POA: Insufficient documentation

## 2024-03-20 DIAGNOSIS — Z95828 Presence of other vascular implants and grafts: Secondary | ICD-10-CM

## 2024-03-20 DIAGNOSIS — Z5112 Encounter for antineoplastic immunotherapy: Secondary | ICD-10-CM | POA: Insufficient documentation

## 2024-03-20 DIAGNOSIS — Z1732 Human epidermal growth factor receptor 2 negative status: Secondary | ICD-10-CM | POA: Insufficient documentation

## 2024-03-20 LAB — CMP (CANCER CENTER ONLY)
ALT: 23 U/L (ref 0–44)
AST: 27 U/L (ref 15–41)
Albumin: 4.2 g/dL (ref 3.5–5.0)
Alkaline Phosphatase: 103 U/L (ref 38–126)
Anion gap: 5 (ref 5–15)
BUN: 14 mg/dL (ref 6–20)
CO2: 28 mmol/L (ref 22–32)
Calcium: 9.1 mg/dL (ref 8.9–10.3)
Chloride: 107 mmol/L (ref 98–111)
Creatinine: 0.8 mg/dL (ref 0.44–1.00)
GFR, Estimated: >60 mL/min (ref >60)
Glucose, Bld: 98 mg/dL (ref 70–99)
Potassium: 3.8 mmol/L (ref 3.5–5.1)
Sodium: 140 mmol/L (ref 135–145)
Total Bilirubin: 0.7 mg/dL (ref 0.0–1.2)
Total Protein: 6.6 g/dL (ref 6.5–8.1)

## 2024-03-20 LAB — CBC WITH DIFFERENTIAL (CANCER CENTER ONLY)
Abs Immature Granulocytes: 0.02 10*3/uL (ref 0.00–0.07)
Basophils Absolute: 0 10*3/uL (ref 0.0–0.1)
Basophils Relative: 1 %
Eosinophils Absolute: 0.2 10*3/uL (ref 0.0–0.5)
Eosinophils Relative: 3 %
HCT: 33.9 % — ABNORMAL LOW (ref 36.0–46.0)
Hemoglobin: 11.5 g/dL — ABNORMAL LOW (ref 12.0–15.0)
Immature Granulocytes: 0 %
Lymphocytes Relative: 24 %
Lymphs Abs: 1.5 10*3/uL (ref 0.7–4.0)
MCH: 33.7 pg (ref 26.0–34.0)
MCHC: 33.9 g/dL (ref 30.0–36.0)
MCV: 99.4 fL (ref 80.0–100.0)
Monocytes Absolute: 0.5 10*3/uL (ref 0.1–1.0)
Monocytes Relative: 8 %
Neutro Abs: 4 10*3/uL (ref 1.7–7.7)
Neutrophils Relative %: 64 %
Platelet Count: 156 10*3/uL (ref 150–400)
RBC: 3.41 MIL/uL — ABNORMAL LOW (ref 3.87–5.11)
RDW: 15.4 % (ref 11.5–15.5)
WBC Count: 6.2 10*3/uL (ref 4.0–10.5)
nRBC: 0 % (ref 0.0–0.2)

## 2024-03-20 MED ORDER — SODIUM CHLORIDE 0.9 % IV SOLN: INTRAVENOUS | Status: DC

## 2024-03-20 MED ORDER — ACETAMINOPHEN 325 MG PO TABS
650.0000 mg | ORAL_TABLET | Freq: Once | ORAL | Status: AC
  Administered 2024-03-20: 650 mg via ORAL
  Filled 2024-03-20: qty 2

## 2024-03-20 MED ORDER — TRASTUZUMAB CHEMO 150 MG IV SOLR
6.0000 mg/kg | Freq: Once | INTRAVENOUS | Status: AC
  Administered 2024-03-20: 378 mg via INTRAVENOUS
  Filled 2024-03-20: qty 18

## 2024-03-20 MED ORDER — SODIUM CHLORIDE 0.9% FLUSH
10.0000 mL | Freq: Once | INTRAVENOUS | Status: AC
  Administered 2024-03-20: 10 mL

## 2024-03-20 MED ORDER — DIPHENHYDRAMINE HCL 25 MG PO CAPS
25.0000 mg | ORAL_CAPSULE | Freq: Once | ORAL | Status: AC
  Administered 2024-03-20: 25 mg via ORAL
  Filled 2024-03-20: qty 1

## 2024-03-20 MED ORDER — SODIUM CHLORIDE 0.9% FLUSH
10.0000 mL | INTRAVENOUS | Status: DC | PRN
  Administered 2024-03-20: 10 mL

## 2024-03-20 MED ORDER — HEPARIN SOD (PORK) LOCK FLUSH 100 UNIT/ML IV SOLN
500.0000 [IU] | Freq: Once | INTRAVENOUS | Status: AC | PRN
  Administered 2024-03-20: 500 [IU]

## 2024-03-20 NOTE — Progress Notes (Signed)
 Patient Care Team: Adela Holter, DO as PCP - General (Family Medicine) Colie Dawes, MD as Consulting Physician (Radiation Oncology) Mark Sil Cranford Dixons, MD as Consulting Physician (Oncology) [facility] [place] (Ophthalmology) Bluford Burkitt, Mitzi Ancona, MD as Referring Physician (Dermatology) Eldon Greenland, MD as Referring Physician (Otolaryngology) Cameron Cea, MD as Medical Oncologist (Hematology and Oncology) Sim Dryer, MD as Consulting Physician (General Surgery)  DIAGNOSIS:  Encounter Diagnosis  Name Primary?   Malignant neoplasm of central portion of right breast in female, estrogen receptor negative (HCC) Yes    SUMMARY OF ONCOLOGIC HISTORY: Oncology History  Malignant neoplasm of central portion of right breast in female, estrogen receptor negative (HCC)  01/04/2012 Initial Diagnosis   Right breast cancer: Stage Ia ER/PR positive HER2 negative status postlumpectomy, radiation (low risk Oncotype) could not tolerate tamoxifen  for more than 30 days.   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 No pathogenic variants identified on the [facility] Multi-Cancer Panel+RNA. VUS in POLD1 identified called c.2429C>T. The report date is 07/01/2021.  The Multi-Cancer  Panel + RNA offered by [facility] includes sequencing and/or deletion duplication testing of the following 84 genes: AIP, ALK, APC, ATM, AXIN2,BAP1,  BARD1, BLM, BMPR1A, BRCA1, BRCA2, BRIP1, CASR, CDC73, CDH1, CDK4, CDKN1B, CDKN1C, CDKN2A (p14ARF), CDKN2A (p16INK4a), CEBPA, CHEK2, CTNNA1, DICER1, DIS3L2, EGFR (c.2369C>T, p.Thr790Met variant only), EPCAM (Deletion/duplication testing only), FH, FLCN, GATA2, GPC3, GREM1 (Promoter region deletion/duplication testing only), HOXB13 (c.251G>A, p.Gly84Glu), HRAS, KIT, MAX, MEN1, MET, MITF (c.952G>A, p.Glu318Lys variant only), MLH1, MSH2, MSH3, MSH6, MUTYH, NBN, NF1, NF2, NTHL1, PALB2, PDGFRA, PHOX2B, PMS2, POLD1, POLE, POT1, PRKAR1A, PTCH1, PTEN, RAD50, RAD51C, RAD51D, RB1, RECQL4, RET, RUNX1, SDHAF2, SDHA (sequence changes only), SDHB, SDHC, SDHD, SMAD4, SMARCA4, SMARCB1, SMARCE1, STK11, SUFU, TERC, TERT, TMEM127, TP53, TSC1, TSC2, VHL, WRN and WT1.   06/18/2021 Cancer Staging   Staging form: Breast, AJCC 8th Edition - Pathologic stage from 06/18/2021: Stage IA (pT1c, pN0, cM0, G3, ER-, PR-, HER2+) - Signed by Percival Brace, NP on 10/05/2022 Stage prefix: Initial diagnosis Histologic grading system: 3 grade system   07/21/2021 - 07/06/2022 Chemotherapy   Patient is on Treatment Plan : BREAST Paclitaxel  + Trastuzumab  q7d / Trastuzumab  q21d     11/25/2021 - 01/04/2022 Radiation Therapy   Site Technique Total Dose (Gy) Dose per Fx (Gy) Completed Fx Beam Energies  Breast, Right: Breast_R 3D 45/45 1.8 25/25 6XFFF  Breast, Right: Breast_R_Bst specialPort 5.4/5.4 1.8 3/3 12E     12/29/2022 Progression   CT chest on 11/01/2022: Right upper lobe nodules largest measuring 5 mm CT chest 12/29/2022: Upper and mid lung zone predominant pulmonary nodules are worrisome for metastatic disease.  Left and right hepatic lobe masses (2.5 cm and 3.9 cm) are new and are worrisome for metastatic  disease.   01/13/2023 Initial Biopsy   Right liver biopsy: Metastatic carcinoma  with breast primary, ER-, PR-, HER2 +, Ki-67 50%.    01/13/2023 Cancer Staging   Staging form: Breast, AJCC 8th Edition - Pathologic stage from 01/13/2023: Stage IV (pM1, G3, ER-, PR-, HER2+) - Signed by Percival Brace, NP on 02/07/2023 Histologic grading system: 3 grade system   01/17/2023 PET scan   PET CT scan 01/17/2023: Hypermetabolic bilobar hepatic (2 masses measuring 2.8 cm SUV 11.1, another mass 4.1 cm SUV 10.3) and pulmonary metastases (right upper lobe lung nodule 5 mm SUV 2.6 right lower lobe 6 mm nodule SUV 1.8)    02/07/2023 - 09/13/2023 Chemotherapy   Patient is on Treatment Plan : BREAST METASTATIC Fam-Trastuzumab Deruxtecan-nxki  (Enhertu ) (5.4) q21d     10/04/2023 -  Chemotherapy   Patient is on Treatment Plan : BREAST Trastuzumab  (8/6) IV D1 + Capecitabine  +  Tucatinib  q21d       CHIEF COMPLIANT:   HISTORY OF PRESENT ILLNESS:  History of Present Illness Lori Mcmillan is a 63 year old female with a history of cancer who presents with gastrointestinal symptoms and a skin lesion.  She experiences diarrhea described as liquid after consuming reheated pizza with sausage, lasting for 24 hours, unresponsive to four doses of Imodium. She feels unwell the following day but has since improved. She took two Imodium before her appointment and had coffee this morning. She has been off Xeloda  for a couple of days. No vomiting is present.  She is currently on Xeloda , with a prescription of 42 pills, adjusting her dosage to two pills in the morning and one in the evening due to running short on medication.  She discovered a tick in the incision under her arm, which is red and tender. She uses antibacterial soap for cleansing. She has a history of tick exposure due to caring for a feral cat and [place].     ALLERGIES:  has no known allergies.  MEDICATIONS:  Current Outpatient Medications  Medication Sig Dispense Refill   acetaminophen  (TYLENOL ) 500 MG  tablet Take 500 mg by mouth as needed.     ALPRAZolam  (XANAX ) 0.25 MG tablet TAKE 1 TABLET BY MOUTH TWICE A DAY AS NEEDED FOR ANXIETY 30 tablet 3   capecitabine  (XELODA ) 500 MG tablet Take 2 tablets (1000 mg) in AM and 1 tablets (500 mg) in PM by mouth every 12 hours. Take for 14 days on, 7 days off. Repeat every 21 days. 42 tablet 3   diazepam (VALIUM) 2 MG tablet Take by mouth as needed (dizziness).     diclofenac  Sodium (VOLTAREN ) 1 % GEL Research Patient: Apply 0.5 grams (1 fingertip) to each hand and each foot twice daily for up to 12 weeks 400 g 0   Famotidine -Ca Carb-Mag Hydrox (PEPCID  COMPLETE PO) Take by mouth as needed.     lidocaine -prilocaine  (EMLA ) cream Apply to affected area once 30 g 3   triamcinolone  ointment (KENALOG ) 0.5 % Apply 1 Application topically 2 (two) times daily. 30 g 0   tucatinib  (TUKYSA ) 150 MG tablet Take 2 tablets (300 mg total) by mouth 2 (two) times daily. Take every 12 hrs at the same time each day with or without a meal. 120 tablet 3   No current facility-administered medications for this visit.    PHYSICAL EXAMINATION: ECOG PERFORMANCE STATUS: 1 - Symptomatic but completely ambulatory  Vitals:   03/20/24 1335  BP: 119/75  Pulse: 74  Resp: 15  Temp: 97.6 F (36.4 C)  SpO2: 100%   Filed Weights   03/20/24 1335  Weight: 133 lb 3.2 oz (60.4 kg)    Physical Exam   (exam performed in the presence of a chaperone)  LABORATORY DATA:  I have reviewed the data as listed    Latest Ref Rng & Units 03/20/2024   12:59 PM 02/07/2024    7:52 AM 12/27/2023   10:20 AM  CMP  Glucose 70 - 99 mg/dL 98  91  93   BUN 6 - 20 mg/dL 14  15  10    Creatinine 0.44 - 1.00 mg/dL 1.61  0.96  0.45   Sodium 135 - 145 mmol/L 140  140  138   Potassium 3.5 - 5.1 mmol/L 3.8  3.8  4.2   Chloride 98 - 111 mmol/L 107  108  107   CO2 22 - 32 mmol/L 28  24  26    Calcium 8.9 - 10.3 mg/dL 9.1  9.0  8.7   Total Protein 6.5 - 8.1 g/dL 6.6  6.5  6.5   Total Bilirubin 0.0 - 1.2  mg/dL 0.7  0.6  0.5   Alkaline Phos 38 - 126 U/L 103  117  118   AST 15 - 41 U/L 27  23  31    ALT 0 - 44 U/L 23  21  29      Lab Results  Component Value Date   WBC 6.2 03/20/2024   HGB 11.5 (L) 03/20/2024   HCT 33.9 (L) 03/20/2024   MCV 99.4 03/20/2024   PLT 156 03/20/2024   NEUTROABS 4.0 03/20/2024    ASSESSMENT & PLAN:  Malignant neoplasm of central portion of right breast in female, estrogen receptor negative (HCC) 06/18/2021:Right lumpectomy: 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  Adjuvant chemotherapy with Taxol  and Herceptin  followed by Herceptin  maintenance completed 07/06/2022 2. breast radiation completed 01/04/2022 3.  Metastatic disease diagnosis:CT chest on 11/01/2022: Right upper lobe nodules largest measuring 5 mm, 01/13/23: Liver Biopsy: Met breast cancer ER 0%, PR 0%, Ki67 50%, HER2 3+  4.  Enhertu  02/07/2023-09/13/2023 --------------------------------------------------------------------------------------------------------------------- PET/CT 09/30/2023: Progression of liver metastasis (3.4 cm was 1.7 cm, 2.3 cm was 1.2 cm, additional liver lesions 1.2 cm and 1.3 cm), lung nodule 0.9 cm was 0.6 cm  Current treatment: Herceptin , Tucatinib , capecitabine  started December 2024 Chemo toxicities: Slight rash on the hands left send chest: Reduce the dosage of Xeloda  to 1000 mg in the morning and 500 mg in the evening.     ECHO 11/25/2023: EF 50-55% CT CAP 01/15/2024: Liver lesions diminished in size (1.6 cm was 3.4 cm, 1.6 cm was 2.3 cm), diminished size of the right lower lobe lung nodule 0.6 cm was 0.9 cm   Echocardiograms will be done every 6 months from this point onwards. RTC in 3 weeks for labs, f/u,  scans and treatment.  Assessment & Plan Malignant neoplasm of central portion of right breast Undergoing capecitabine  and tucatinib  treatment. Echocardiogram normal. Hemoglobin 11.5, platelets normal. Full body scan planned in three weeks. - Continue capecitabine  and tucatinib . - Schedule full body scan around June 18th. - Discuss scan results on June 24th.  Adverse effect of capecitabine  Experienced diarrhea  after capecitabine  cessation. Imodium initially ineffective, diarrhea improved. - Monitor for diarrhea recurrence. - Advise Imodium use, max 8 tablets/24 hours if needed.  Tick bite with local reaction Tick bite under arm, red and tender. Tick removed, area monitored. - Monitor tick bite area until Friday. - Apply Neosporin. - Ensure bedding and surroundings are tick-free.      Orders Placed This Encounter  Procedures   CT CHEST ABDOMEN PELVIS W CONTRAST    Standing Status:   Future    Expected Date:   04/04/2024    Expiration Date:   03/20/2025    If indicated for the ordered procedure, I authorize the administration of contrast media per Radiology protocol:   Yes    Does the patient have a contrast media/X-ray dye allergy?:   No    Is patient pregnant?:   No    Preferred imaging location?:   [facility]    Release to patient:   Immediate    If indicated for the ordered procedure, I authorize the administration of oral contrast media per Radiology protocol:   Yes   The patient has a good understanding of the overall plan. she agrees with it. she will call with any problems that may develop before the next visit here. Total time spent: 30 mins including face to face time and time spent for planning, charting and co-ordination of care   Viinay K Dorine Duffey, MD 03/20/24

## 2024-03-20 NOTE — Assessment & Plan Note (Signed)
 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  Adjuvant chemotherapy with Taxol  and Herceptin  followed by Herceptin  maintenance completed 07/06/2022 2. breast radiation completed 01/04/2022 3.  Metastatic disease diagnosis:CT chest on 11/01/2022: Right upper lobe nodules largest measuring 5 mm, 01/13/23: Liver Biopsy: Met breast cancer ER 0%, PR 0%, Ki67 50%, HER2 3+  4.  Enhertu  02/07/2023-09/13/2023 --------------------------------------------------------------------------------------------------------------------- PET/CT 09/30/2023: Progression of liver metastasis (3.4 cm was 1.7 cm, 2.3 cm was 1.2 cm, additional liver lesions 1.2 cm and 1.3 cm), lung nodule 0.9 cm was 0.6 cm  Current treatment: Herceptin , Tucatinib , capecitabine  started December 2024 Chemo toxicities: Slight rash on the hands left send chest: Reduce the dosage of Xeloda  to 1000 mg in the morning and 500 mg in the evening.     ECHO 11/25/2023: EF 50-55% CT CAP 01/15/2024: Liver lesions diminished in size (1.6 cm was 3.4 cm, 1.6 cm was 2.3 cm), diminished size of the right lower lobe lung nodule 0.6 cm was 0.9 cm   Echocardiograms will be done every 6 months from this point onwards. RTC in 3 weeks for labs, f/u, and treatment.

## 2024-03-20 NOTE — Patient Instructions (Signed)
 [facility] - A DEPT OF [facility]  Discharge Instructions: Thank you for choosing [facility] to provide your oncology and hematology care.   If you have a lab appointment with the Cancer Center, please go directly to the Cancer Center and check in at the registration area.   Wear comfortable clothing and clothing appropriate for easy access to any Portacath or PICC line.   We strive to give you quality time with your provider. You may need to reschedule your appointment if you arrive late (15 or more minutes).  Arriving late affects you and other patients whose appointments are after yours.  Also, if you miss three or more appointments without notifying the office, you may be dismissed from the clinic at the provider's discretion.      For prescription refill requests, have your pharmacy contact our office and allow 72 hours for refills to be completed.    Today you received the following chemotherapy and/or immunotherapy agents: Herceptin      To help prevent nausea and vomiting after your treatment, we encourage you to take your nausea medication as directed.  BELOW ARE SYMPTOMS THAT SHOULD BE REPORTED IMMEDIATELY: *FEVER GREATER THAN 100.4 F (38 C) OR HIGHER *CHILLS OR SWEATING *NAUSEA AND VOMITING THAT IS NOT CONTROLLED WITH YOUR NAUSEA MEDICATION *UNUSUAL SHORTNESS OF BREATH *UNUSUAL BRUISING OR BLEEDING *URINARY PROBLEMS (pain or burning when urinating, or frequent urination) *BOWEL PROBLEMS (unusual diarrhea, constipation, pain near the anus) TENDERNESS IN MOUTH AND THROAT WITH OR WITHOUT PRESENCE OF ULCERS (sore throat, sores in mouth, or a toothache) UNUSUAL RASH, SWELLING OR PAIN  UNUSUAL VAGINAL DISCHARGE OR ITCHING   Items with * indicate a potential emergency and should be followed up as soon as possible or go to the Emergency Department if any problems should occur.  Please show the CHEMOTHERAPY ALERT CARD or IMMUNOTHERAPY  ALERT CARD at check-in to the Emergency Department and triage nurse.  Should you have questions after your visit or need to cancel or reschedule your appointment, please contact [facility] - A DEPT OF [facility]  Dept: [phone]  and follow the prompts.  Office hours are 8:00 a.m. to 4:30 p.m. Monday - Friday. Please note that voicemails left after 4:00 p.m. may not be returned until the following business day.  We are closed weekends and major holidays. You have access to a nurse at all times for urgent questions. Please call the main number to the clinic Dept: [phone] and follow the prompts.   For any non-urgent questions, you may also contact your provider using MyChart. We now offer e-Visits for anyone 68 and older to request care online for non-urgent symptoms. For details visit [email or web address].   Also download the MyChart app! Go to the app store, search "MyChart", open the app, select , and log in with your MyChart username and password.

## 2024-03-21 LAB — CANCER ANTIGEN 27.29: CA 27.29: <9 U/mL (ref 0.0–38.6)

## 2024-03-28 ENCOUNTER — Other Ambulatory Visit: Payer: Self-pay | Admitting: *Deleted

## 2024-03-30 ENCOUNTER — Other Ambulatory Visit: Payer: Self-pay

## 2024-04-03 ENCOUNTER — Other Ambulatory Visit: Payer: Self-pay | Admitting: Pharmacy Technician

## 2024-04-03 ENCOUNTER — Other Ambulatory Visit: Payer: Self-pay

## 2024-04-03 NOTE — Progress Notes (Signed)
 Specialty Pharmacy Refill Coordination Note  Lori Mcmillan is a 63 y.o. female contacted today regarding refills of specialty medication(s) Capecitabine  (XELODA )   Patient requested Lori Mcmillan at [facility] at [place] date: 04/10/24   Medication will be filled on 04/09/24.

## 2024-04-04 ENCOUNTER — Ambulatory Visit (HOSPITAL_COMMUNITY)
Admission: RE | Admit: 2024-04-04 | Discharge: 2024-04-04 | Disposition: A | Discharge status: Home / Self Care | Source: Ambulatory Visit | Attending: Hematology and Oncology | Admitting: Hematology and Oncology

## 2024-04-04 ENCOUNTER — Other Ambulatory Visit (HOSPITAL_COMMUNITY): Payer: Self-pay

## 2024-04-04 DIAGNOSIS — C50111 Malignant neoplasm of central portion of right female breast: Secondary | ICD-10-CM | POA: Diagnosis not present

## 2024-04-04 DIAGNOSIS — C787 Secondary malignant neoplasm of liver and intrahepatic bile duct: Secondary | ICD-10-CM | POA: Diagnosis not present

## 2024-04-04 DIAGNOSIS — Z171 Estrogen receptor negative status [ER-]: Secondary | ICD-10-CM | POA: Diagnosis not present

## 2024-04-04 DIAGNOSIS — R911 Solitary pulmonary nodule: Secondary | ICD-10-CM | POA: Diagnosis not present

## 2024-04-04 MED ORDER — HEPARIN SOD (PORK) LOCK FLUSH 100 UNIT/ML IV SOLN
INTRAVENOUS | Status: AC
  Filled 2024-04-04: qty 5

## 2024-04-04 MED ORDER — IOHEXOL 300 MG/ML  SOLN
100.0000 mL | Freq: Once | INTRAMUSCULAR | Status: AC | PRN
  Administered 2024-04-04: 100 mL via INTRAVENOUS

## 2024-04-04 MED ORDER — SODIUM CHLORIDE (PF) 0.9 % IJ SOLN
INTRAMUSCULAR | Status: AC
  Filled 2024-04-04: qty 50

## 2024-04-04 MED ORDER — HEPARIN SOD (PORK) LOCK FLUSH 100 UNIT/ML IV SOLN
500.0000 [IU] | Freq: Once | INTRAVENOUS | Status: AC
  Administered 2024-04-04: 500 [IU] via INTRAVENOUS

## 2024-04-05 ENCOUNTER — Telehealth: Payer: Self-pay | Admitting: *Deleted

## 2024-04-05 NOTE — Telephone Encounter (Signed)
 Received call from pt requesting to review recent CT scan with MD.  MD out of office, RN educated pt that MD will review during visit next week.  Pt verbalized understanding.

## 2024-04-07 ENCOUNTER — Encounter (INDEPENDENT_AMBULATORY_CARE_PROVIDER_SITE_OTHER): Payer: Self-pay

## 2024-04-09 ENCOUNTER — Other Ambulatory Visit: Payer: Self-pay

## 2024-04-09 ENCOUNTER — Other Ambulatory Visit (HOSPITAL_COMMUNITY): Payer: Self-pay

## 2024-04-09 NOTE — Progress Notes (Signed)
 Specialty Pharmacy Refill Coordination Note  Lori Mcmillan is a 63 y.o. female contacted today regarding refills of specialty medication(s) Tucatinib  (TUKYSA )   Patient requested [name] at [facility] at [place] date: 04/10/24   Medication will be filled on 04/11/24.   Insurance will not cover refill until 04/11/24

## 2024-04-09 NOTE — Progress Notes (Signed)
 Spoke with patient. Lori Mcmillan does not need her refill of Tukysa  at this time. Patient ok with picking up next supply on next Wednesday 7/2.

## 2024-04-10 ENCOUNTER — Ambulatory Visit

## 2024-04-10 ENCOUNTER — Other Ambulatory Visit (HOSPITAL_COMMUNITY): Payer: Self-pay

## 2024-04-10 ENCOUNTER — Inpatient Hospital Stay (HOSPITAL_BASED_OUTPATIENT_CLINIC_OR_DEPARTMENT_OTHER): Admitting: Hematology and Oncology

## 2024-04-10 VITALS — BP 108/69 | HR 80 | Temp 97.6°F | Resp 17 | Wt 131.7 lb

## 2024-04-10 DIAGNOSIS — Z171 Estrogen receptor negative status [ER-]: Secondary | ICD-10-CM

## 2024-04-10 DIAGNOSIS — Z5112 Encounter for antineoplastic immunotherapy: Secondary | ICD-10-CM | POA: Diagnosis not present

## 2024-04-10 DIAGNOSIS — C787 Secondary malignant neoplasm of liver and intrahepatic bile duct: Secondary | ICD-10-CM | POA: Diagnosis not present

## 2024-04-10 DIAGNOSIS — Z170 Estrogen receptor positive status [ER+]: Secondary | ICD-10-CM | POA: Diagnosis not present

## 2024-04-10 DIAGNOSIS — Z79899 Other long term (current) drug therapy: Secondary | ICD-10-CM | POA: Diagnosis not present

## 2024-04-10 DIAGNOSIS — C50111 Malignant neoplasm of central portion of right female breast: Secondary | ICD-10-CM

## 2024-04-10 DIAGNOSIS — Z1732 Human epidermal growth factor receptor 2 negative status: Secondary | ICD-10-CM | POA: Diagnosis not present

## 2024-04-10 DIAGNOSIS — Z923 Personal history of irradiation: Secondary | ICD-10-CM | POA: Diagnosis not present

## 2024-04-10 DIAGNOSIS — Z1722 Progesterone receptor negative status: Secondary | ICD-10-CM | POA: Diagnosis not present

## 2024-04-10 MED ORDER — SODIUM CHLORIDE 0.9 % IV SOLN: INTRAVENOUS | Status: DC

## 2024-04-10 MED ORDER — ACETAMINOPHEN 325 MG PO TABS
650.0000 mg | ORAL_TABLET | Freq: Once | ORAL | Status: AC
  Administered 2024-04-10: 650 mg via ORAL

## 2024-04-10 MED ORDER — TRASTUZUMAB CHEMO 150 MG IV SOLR
6.0000 mg/kg | Freq: Once | INTRAVENOUS | Status: AC
  Administered 2024-04-10: 378 mg via INTRAVENOUS
  Filled 2024-04-10: qty 18

## 2024-04-10 MED ORDER — DIPHENHYDRAMINE HCL 25 MG PO CAPS
25.0000 mg | ORAL_CAPSULE | Freq: Once | ORAL | Status: AC
  Administered 2024-04-10: 25 mg via ORAL

## 2024-04-10 NOTE — Progress Notes (Signed)
 Patient Care Team: Alvia Bring, DO as PCP - General (Family Medicine) Izell Domino, MD as Consulting Physician (Radiation Oncology) Buckley Arthea POUR, MD as Consulting Physician (Oncology) [facility] [place] (Ophthalmology) Lesleigh, Mliss Grow, MD as Referring Physician (Dermatology) Terri Alan PARAS, MD as Referring Physician (Otolaryngology) Odean Potts, MD as Medical Oncologist (Hematology and Oncology) Vanderbilt Ned, MD as Consulting Physician (General Surgery)  DIAGNOSIS:  Encounter Diagnosis  Name Primary?   Malignant neoplasm of central portion of right breast in female, estrogen receptor negative (HCC) Yes    SUMMARY OF ONCOLOGIC HISTORY: Oncology History  Malignant neoplasm of central portion of right breast in female, estrogen receptor negative (HCC)  01/04/2012 Initial Diagnosis   Right breast cancer: Stage Ia ER/PR positive HER2 negative status postlumpectomy, radiation (low risk Oncotype) could not tolerate tamoxifen  for more than 30 days.   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 No pathogenic variants identified on the [facility] Multi-Cancer Panel+RNA. VUS in POLD1 identified called c.2429C>T. The report date is 07/01/2021.  The Multi-Cancer  Panel + RNA offered by [facility] includes sequencing and/or deletion duplication testing of the following 84 genes: AIP, ALK, APC, ATM, AXIN2,BAP1,  BARD1, BLM, BMPR1A, BRCA1, BRCA2, BRIP1, CASR, CDC73, CDH1, CDK4, CDKN1B, CDKN1C, CDKN2A (p14ARF), CDKN2A (p16INK4a), CEBPA, CHEK2, CTNNA1, DICER1, DIS3L2, EGFR (c.2369C>T, p.Thr790Met variant only), EPCAM (Deletion/duplication testing only), FH, FLCN, GATA2, GPC3, GREM1 (Promoter region deletion/duplication testing only), HOXB13 (c.251G>A, p.Gly84Glu), HRAS, KIT, MAX, MEN1, MET, MITF (c.952G>A, p.Glu318Lys variant only), MLH1, MSH2, MSH3, MSH6, MUTYH, NBN, NF1, NF2, NTHL1, PALB2, PDGFRA, PHOX2B, PMS2, POLD1, POLE, POT1, PRKAR1A, PTCH1, PTEN, RAD50, RAD51C, RAD51D, RB1, RECQL4, RET, RUNX1, SDHAF2, SDHA (sequence changes only), SDHB, SDHC, SDHD, SMAD4, SMARCA4, SMARCB1, SMARCE1, STK11, SUFU, TERC, TERT, TMEM127, TP53, TSC1, TSC2, VHL, WRN and WT1.   06/18/2021 Cancer Staging   Staging form: Breast, AJCC 8th Edition - Pathologic stage from 06/18/2021: Stage IA (pT1c, pN0, cM0, G3, ER-, PR-, HER2+) - Signed by Crawford Morna Pickle, NP on 10/05/2022 Stage prefix: Initial diagnosis Histologic grading system: 3 grade system   07/21/2021 - 07/06/2022 Chemotherapy   Patient is on Treatment Plan : BREAST Paclitaxel  + Trastuzumab  q7d / Trastuzumab  q21d     11/25/2021 - 01/04/2022 Radiation Therapy   Site Technique Total Dose (Gy) Dose per Fx (Gy) Completed Fx Beam Energies  Breast, Right: Breast_R 3D 45/45 1.8 25/25 6XFFF  Breast, Right: Breast_R_Bst specialPort 5.4/5.4 1.8 3/3 12E     12/29/2022 Progression   CT chest on 11/01/2022: Right upper lobe nodules largest measuring 5 mm CT chest 12/29/2022: Upper and mid lung zone predominant pulmonary nodules are worrisome for metastatic disease.  Left and right hepatic lobe masses (2.5 cm and 3.9 cm) are new and are worrisome for metastatic  disease.   01/13/2023 Initial Biopsy   Right liver biopsy: Metastatic carcinoma  with breast primary, ER-, PR-, HER2 +, Ki-67 50%.    01/13/2023 Cancer Staging   Staging form: Breast, AJCC 8th Edition - Pathologic stage from 01/13/2023: Stage IV (pM1, G3, ER-, PR-, HER2+) - Signed by Crawford Morna Pickle, NP on 02/07/2023 Histologic grading system: 3 grade system   01/17/2023 PET scan   PET CT scan 01/17/2023: Hypermetabolic bilobar hepatic (2 masses measuring 2.8 cm SUV 11.1, another mass 4.1 cm SUV 10.3) and pulmonary metastases (right upper lobe lung nodule 5 mm SUV 2.6 right lower lobe 6 mm nodule SUV 1.8)    02/07/2023 - 09/13/2023 Chemotherapy   Patient is on Treatment Plan : BREAST METASTATIC Fam-Trastuzumab Deruxtecan-nxki  (Enhertu ) (5.4) q21d     10/04/2023 -  Chemotherapy   Patient is on Treatment Plan : BREAST Trastuzumab  (8/6) IV D1 + Capecitabine  +  Tucatinib  q21d       CHIEF COMPLIANT: Follow-up on Herceptin  capecitabine  Tucatinib , recent scans  HISTORY OF PRESENT ILLNESS:  History of Present Illness Lori Mcmillan is a 63 year old female with hepatic lesions who presents for follow-up imaging results.  Recent imaging shows a nodule in the right lobe of the liver measuring 1.7 cm, previously 1.6 cm, with no significant interval change. The nodule appears more conspicuous, likely due to contrast. Several smaller lesions in the left lobe remain unchanged. She is concerned about the imaging report.  She tolerates her current medication regimen well, experiencing minor side effects. She experiences a sensation in her hands and her feet tend to split, for which she uses Flexitol and Aquaphor to keep them moisturized. She recently completed a two-in-one course of treatment and her skin is stable with only subtle redness.     ALLERGIES:  has no known allergies.  MEDICATIONS:  Current Outpatient Medications  Medication Sig Dispense Refill   acetaminophen  (TYLENOL ) 500 MG tablet Take 500 mg by mouth as needed.     ALPRAZolam  (XANAX ) 0.25 MG tablet TAKE 1  TABLET BY MOUTH TWICE A DAY AS NEEDED FOR ANXIETY 30 tablet 3   capecitabine  (XELODA ) 500 MG tablet Take 2 tablets (1000 mg) in AM and 1 tablets (500 mg) in PM by mouth every 12 hours. Take for 14 days on, 7 days off. Repeat every 21 days. 42 tablet 3   Famotidine -Ca Carb-Mag Hydrox (PEPCID  COMPLETE PO) Take by mouth as needed.     lidocaine -prilocaine  (EMLA ) cream Apply to affected area once 30 g 3   tucatinib  (TUKYSA ) 150 MG tablet Take 2 tablets (300 mg total) by mouth 2 (two) times daily. Take every 12 hrs at the same time each day with or without a meal. 120 tablet 3   No current facility-administered medications for this visit.    PHYSICAL EXAMINATION: ECOG PERFORMANCE STATUS: 1 - Symptomatic but completely ambulatory  Vitals:   04/10/24 0903  BP: 108/69  Pulse: 80  Resp: 17  Temp: 97.6 F (36.4 C)  SpO2: 98%   Filed Weights   04/10/24 0903  Weight: 131 lb 11.2 oz (59.7 kg)    Physical Exam   (exam performed in the presence of a chaperone)  LABORATORY DATA:  I have reviewed the data as listed    Latest Ref Rng & Units 03/20/2024   12:59 PM 02/07/2024    7:52 AM 12/27/2023   10:20 AM  CMP  Glucose 70 - 99 mg/dL 98  91  93   BUN  6 - 20 mg/dL 14  15  10    Creatinine 0.44 - 1.00 mg/dL 9.19  9.10  9.18   Sodium 135 - 145 mmol/L 140  140  138   Potassium 3.5 - 5.1 mmol/L 3.8  3.8  4.2   Chloride 98 - 111 mmol/L 107  108  107   CO2 22 - 32 mmol/L 28  24  26    Calcium 8.9 - 10.3 mg/dL 9.1  9.0  8.7   Total Protein 6.5 - 8.1 g/dL 6.6  6.5  6.5   Total Bilirubin 0.0 - 1.2 mg/dL 0.7  0.6  0.5   Alkaline Phos 38 - 126 U/L 103  117  118   AST 15 - 41 U/L 27  23  31    ALT 0 - 44 U/L 23  21  29      Lab Results  Component Value Date   WBC 6.2 03/20/2024   HGB 11.5 (L) 03/20/2024   HCT 33.9 (L) 03/20/2024   MCV 99.4 03/20/2024   PLT 156 03/20/2024   NEUTROABS 4.0 03/20/2024    ASSESSMENT & PLAN:  Malignant neoplasm of central portion of right breast in female,  estrogen receptor negative (HCC) 06/18/2021:Right lumpectomy: 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  Adjuvant chemotherapy with Taxol  and Herceptin  followed by Herceptin  maintenance completed 07/06/2022 2. breast radiation completed 01/04/2022 3.  Metastatic disease diagnosis:CT chest on 11/01/2022: Right upper lobe nodules largest measuring 5 mm, 01/13/23: Liver Biopsy: Met breast cancer ER 0%, PR 0%, Ki67 50%, HER2 3+  4.  Enhertu  02/07/2023-09/13/2023 --------------------------------------------------------------------------------------------------------------------- PET/CT 09/30/2023: Progression of liver metastasis (3.4 cm was 1.7 cm, 2.3 cm was 1.2 cm, additional liver lesions 1.2 cm and 1.3 cm), lung nodule 0.9 cm was 0.6 cm  Current treatment: Herceptin , Tucatinib , capecitabine  started December 2024 Chemo toxicities: Slight rash on the hands left send chest: Reduce the dosage of Xeloda  to 1000 mg in the morning and 500 mg in the evening.:  Continues to moisturize and keep the hands and feet hydrated     ECHO 11/25/2023: EF 50-55% CT CAP 01/15/2024: Liver lesions diminished in size (1.6 cm was 3.4 cm, 1.6 cm was 2.3 cm), diminished size of the right lower lobe lung nodule 0.6 cm was 0.9 cm CT CAP 04/04/2024: Stable hepatic metastases Echo: June 2025: EF 55% normal   echocardiograms will be done every 6 months from this point onwards. RTC in 3 weeks for labs, f/u, and treatment.    No orders of the defined types were placed in this encounter.  The patient has a good understanding of the overall plan. she agrees with it. she will call with any problems that may develop before the next visit here. Total time spent: 30  mins including face to face time and time spent for planning, charting and co-ordination of care   Naomi MARLA Chad, MD 04/10/24

## 2024-04-10 NOTE — Assessment & Plan Note (Signed)
 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  Adjuvant chemotherapy with Taxol  and Herceptin  followed by Herceptin  maintenance completed 07/06/2022 2. breast radiation completed 01/04/2022 3.  Metastatic disease diagnosis:CT chest on 11/01/2022: Right upper lobe nodules largest measuring 5 mm, 01/13/23: Liver Biopsy: Met breast cancer ER 0%, PR 0%, Ki67 50%, HER2 3+  4.  Enhertu  02/07/2023-09/13/2023 --------------------------------------------------------------------------------------------------------------------- PET/CT 09/30/2023: Progression of liver metastasis (3.4 cm was 1.7 cm, 2.3 cm was 1.2 cm, additional liver lesions 1.2 cm and 1.3 cm), lung nodule 0.9 cm was 0.6 cm  Current treatment: Herceptin , Tucatinib , capecitabine  started December 2024 Chemo toxicities: Slight rash on the hands left send chest: Reduce the dosage of Xeloda  to 1000 mg in the morning and 500 mg in the evening.     ECHO 11/25/2023: EF 50-55% CT CAP 01/15/2024: Liver lesions diminished in size (1.6 cm was 3.4 cm, 1.6 cm was 2.3 cm), diminished size of the right lower lobe lung nodule 0.6 cm was 0.9 cm CT CAP 04/04/2024: Stable hepatic metastases   Echocardiograms will be done every 6 months from this point onwards. RTC in 3 weeks for labs, f/u, and treatment.

## 2024-04-16 ENCOUNTER — Other Ambulatory Visit: Payer: Self-pay

## 2024-04-16 NOTE — Progress Notes (Signed)
 Specialty Pharmacy Ongoing Clinical Assessment Note  Lori Mcmillan is a 63 y.o. female who is being followed by the specialty pharmacy service for RxSp Oncology   Patient's specialty medication(s) reviewed today: Tucatinib  (TUKYSA ); Capecitabine  (XELODA )   Missed doses in the last 4 weeks: 0   Patient/Caregiver did not have any additional questions or concerns.   Therapeutic benefit summary: Patient is achieving benefit   Adverse events/side effects summary: Experienced adverse events/side effects (Dryness in hands and feet, able to keep under control with aquaphor; fatigue and change in taste)   Patient's therapy is appropriate to: Continue    Goals Addressed             This Visit's Progress    Maintain optimal adherence to therapy   On track    Patient is on track. Patient will maintain adherence         Follow up: 3 months  [facility] Specialty Pharmacist

## 2024-04-19 ENCOUNTER — Ambulatory Visit (HOSPITAL_COMMUNITY)

## 2024-04-23 ENCOUNTER — Ambulatory Visit: Payer: 59 | Admitting: Internal Medicine

## 2024-04-23 ENCOUNTER — Other Ambulatory Visit (HOSPITAL_COMMUNITY): Payer: Self-pay

## 2024-04-26 ENCOUNTER — Encounter (INDEPENDENT_AMBULATORY_CARE_PROVIDER_SITE_OTHER): Payer: Self-pay

## 2024-04-26 ENCOUNTER — Other Ambulatory Visit: Payer: Self-pay | Admitting: Pharmacy Technician

## 2024-04-26 ENCOUNTER — Other Ambulatory Visit: Payer: Self-pay

## 2024-04-26 NOTE — Progress Notes (Signed)
 Specialty Pharmacy Refill Coordination Note  Lori Mcmillan is a 63 y.o. female contacted today regarding refills of specialty medication(s) Capecitabine  (XELODA )   Patient requested (Patient-Rptd) Pickup at [facility] at [facility] date: (Patient-Rptd) 05/01/24   Medication will be filled on 04/30/24.

## 2024-05-01 ENCOUNTER — Other Ambulatory Visit (HOSPITAL_COMMUNITY): Payer: Self-pay

## 2024-05-01 ENCOUNTER — Inpatient Hospital Stay

## 2024-05-01 ENCOUNTER — Other Ambulatory Visit: Payer: Self-pay

## 2024-05-01 ENCOUNTER — Inpatient Hospital Stay: Attending: Hematology and Oncology | Admitting: Hematology and Oncology

## 2024-05-01 VITALS — BP 104/65 | HR 65 | Temp 97.7°F | Resp 20

## 2024-05-01 VITALS — BP 116/71 | HR 76 | Temp 98.0°F | Resp 17 | Ht 63.0 in | Wt 130.5 lb

## 2024-05-01 DIAGNOSIS — C50111 Malignant neoplasm of central portion of right female breast: Secondary | ICD-10-CM | POA: Insufficient documentation

## 2024-05-01 DIAGNOSIS — C787 Secondary malignant neoplasm of liver and intrahepatic bile duct: Secondary | ICD-10-CM | POA: Insufficient documentation

## 2024-05-01 DIAGNOSIS — Z5112 Encounter for antineoplastic immunotherapy: Secondary | ICD-10-CM | POA: Diagnosis not present

## 2024-05-01 DIAGNOSIS — C7800 Secondary malignant neoplasm of unspecified lung: Secondary | ICD-10-CM | POA: Insufficient documentation

## 2024-05-01 DIAGNOSIS — Z1722 Progesterone receptor negative status: Secondary | ICD-10-CM | POA: Diagnosis not present

## 2024-05-01 DIAGNOSIS — Z923 Personal history of irradiation: Secondary | ICD-10-CM | POA: Diagnosis not present

## 2024-05-01 DIAGNOSIS — Z171 Estrogen receptor negative status [ER-]: Secondary | ICD-10-CM | POA: Insufficient documentation

## 2024-05-01 DIAGNOSIS — Z1731 Human epidermal growth factor receptor 2 positive status: Secondary | ICD-10-CM | POA: Insufficient documentation

## 2024-05-01 MED ORDER — DIPHENHYDRAMINE HCL 25 MG PO CAPS
25.0000 mg | ORAL_CAPSULE | Freq: Once | ORAL | Status: AC
  Administered 2024-05-01: 25 mg via ORAL
  Filled 2024-05-01: qty 1

## 2024-05-01 MED ORDER — ACETAMINOPHEN 325 MG PO TABS
650.0000 mg | ORAL_TABLET | Freq: Once | ORAL | Status: AC
  Administered 2024-05-01: 650 mg via ORAL
  Filled 2024-05-01: qty 2

## 2024-05-01 MED ORDER — SODIUM CHLORIDE 0.9 % IV SOLN: INTRAVENOUS | Status: DC

## 2024-05-01 MED ORDER — TRASTUZUMAB CHEMO 150 MG IV SOLR
6.0000 mg/kg | Freq: Once | INTRAVENOUS | Status: AC
  Administered 2024-05-01: 378 mg via INTRAVENOUS
  Filled 2024-05-01: qty 18

## 2024-05-01 MED ORDER — CAPECITABINE 500 MG PO TABS
ORAL_TABLET | ORAL | 3 refills | Status: DC
  Filled 2024-05-01: qty 42, fill #0
  Filled 2024-05-16: qty 28, 21d supply, fill #0
  Filled 2024-06-04 – 2024-06-06 (×2): qty 28, 21d supply, fill #1
  Filled 2024-06-29: qty 28, 21d supply, fill #2
  Filled 2024-07-16: qty 28, 21d supply, fill #3
  Filled 2024-08-06: qty 28, 21d supply, fill #4

## 2024-05-01 MED ORDER — SODIUM CHLORIDE 0.9% FLUSH
10.0000 mL | INTRAVENOUS | Status: DC | PRN
  Administered 2024-05-01: 10 mL

## 2024-05-01 MED ORDER — HEPARIN SOD (PORK) LOCK FLUSH 100 UNIT/ML IV SOLN
500.0000 [IU] | Freq: Once | INTRAVENOUS | Status: AC | PRN
  Administered 2024-05-01: 500 [IU]

## 2024-05-01 NOTE — Patient Instructions (Signed)
 [facility] - A DEPT OF [facility]  Discharge Instructions: Thank you for choosing [facility] to provide your oncology and hematology care.   If you have a lab appointment with the Cancer Center, please go directly to the Cancer Center and check in at the registration area.   Wear comfortable clothing and clothing appropriate for easy access to any Portacath or PICC line.   We strive to give you quality time with your provider. You may need to reschedule your appointment if you arrive late (15 or more minutes).  Arriving late affects you and other patients whose appointments are after yours.  Also, if you miss three or more appointments without notifying the office, you may be dismissed from the clinic at the provider's discretion.      For prescription refill requests, have your pharmacy contact our office and allow 72 hours for refills to be completed.    Today you received the following chemotherapy and/or immunotherapy agent: Trastuzumab  ( Herceptin )     To help prevent nausea and vomiting after your treatment, we encourage you to take your nausea medication as directed.  BELOW ARE SYMPTOMS THAT SHOULD BE REPORTED IMMEDIATELY: *FEVER GREATER THAN 100.4 F (38 C) OR HIGHER *CHILLS OR SWEATING *NAUSEA AND VOMITING THAT IS NOT CONTROLLED WITH YOUR NAUSEA MEDICATION *UNUSUAL SHORTNESS OF BREATH *UNUSUAL BRUISING OR BLEEDING *URINARY PROBLEMS (pain or burning when urinating, or frequent urination) *BOWEL PROBLEMS (unusual diarrhea, constipation, pain near the anus) TENDERNESS IN MOUTH AND THROAT WITH OR WITHOUT PRESENCE OF ULCERS (sore throat, sores in mouth, or a toothache) UNUSUAL RASH, SWELLING OR PAIN  UNUSUAL VAGINAL DISCHARGE OR ITCHING   Items with * indicate a potential emergency and should be followed up as soon as possible or go to the Emergency Department if any problems should occur.  Please show the CHEMOTHERAPY ALERT CARD or  IMMUNOTHERAPY ALERT CARD at check-in to the Emergency Department and triage nurse.  Should you have questions after your visit or need to cancel or reschedule your appointment, please contact [facility] - A DEPT OF [facility]  Dept: [phone]  and follow the prompts.  Office hours are 8:00 a.m. to 4:30 p.m. Monday - Friday. Please note that voicemails left after 4:00 p.m. may not be returned until the following business day.  We are closed weekends and major holidays. You have access to a nurse at all times for urgent questions. Please call the main number to the clinic Dept: [phone] and follow the prompts.   For any non-urgent questions, you may also contact your provider using MyChart. We now offer e-Visits for anyone 52 and older to request care online for non-urgent symptoms. For details visit [email or web address].   Also download the MyChart app! Go to the app store, search MyChart, open the app, select [facility], and log in with your MyChart username and password.  Trastuzumab  Injection What is this medication? TRASTUZUMAB  (tras TOO zoo mab) treats breast cancer and stomach cancer. It works by blocking a protein that causes cancer cells to grow and multiply. This helps to slow or stop the spread of cancer cells. This medicine may be used for other purposes; ask your health care provider or pharmacist if you have questions. COMMON BRAND NAME(S): Herceptin , HERCESSI, Herzuma , KANJINTI , Ogivri , Ontruzant , Trazimera  What should I tell my care team before I take this medication? They need to know if you have any  of these conditions: Heart failure Lung disease An unusual or allergic reaction to trastuzumab , other medications, foods, dyes, or preservatives Pregnant or trying to get pregnant Breast-feeding How should I use this medication? This medication is injected into a vein. It is given by your care team in a hospital or clinic setting. Talk  to your care team about the use of this medication in children. It is not approved for use in children. Overdosage: If you think you have taken too much of this medicine contact a poison control center or emergency room at once. NOTE: This medicine is only for you. Do not share this medicine with others. What if I miss a dose? Keep appointments for follow-up doses. It is important not to miss your dose. Call your care team if you are unable to keep an appointment. What may interact with this medication? Certain types of chemotherapy, such as daunorubicin, doxorubicin, epirubicin, idarubicin This list may not describe all possible interactions. Give your health care provider a list of all the medicines, herbs, non-prescription drugs, or dietary supplements you use. Also tell them if you smoke, drink alcohol, or use illegal drugs. Some items may interact with your medicine. What should I watch for while using this medication? Your condition will be monitored carefully while you are receiving this medication. This medication may make you feel generally unwell. This is not uncommon, as chemotherapy affects healthy cells as well as cancer cells. Report any side effects. Continue your course of treatment even though you feel ill unless your care team tells you to stop. This medication may increase your risk of getting an infection. Call your care team for advice if you get a fever, chills, sore throat, or other symptoms of a cold or flu. Do not treat yourself. Try to avoid being around people who are sick. Avoid taking medications that contain aspirin , acetaminophen , ibuprofen , naproxen , or ketoprofen unless instructed by your care team. These medications can hide a fever. Talk to your care team if you may be pregnant. Serious birth defects can occur if you take this medication during pregnancy and for 7 months after the last dose. You will need a negative pregnancy test before starting this medication.  Contraception is recommended while taking this medication and for 7 months after the last dose. Your care team can help you find the option that works for you. Do not breastfeed while taking this medication and for 7 months after stopping treatment. What side effects may I notice from receiving this medication? Side effects that you should report to your care team as soon as possible: Allergic reactions or angioedema--skin rash, itching or hives, swelling of the face, eyes, lips, tongue, arms, or legs, trouble swallowing or breathing Dry cough, shortness of breath or trouble breathing Heart failure--shortness of breath, swelling of the ankles, feet, or hands, sudden weight gain, unusual weakness or fatigue Infection--fever, chills, cough, or sore throat Infusion reactions--chest pain, shortness of breath or trouble breathing, feeling faint or lightheaded Side effects that usually do not require medical attention (report to your care team if they continue or are bothersome): Diarrhea Dizziness Headache Nausea Trouble sleeping Vomiting This list may not describe all possible side effects. Call your doctor for medical advice about side effects. You may report side effects to FDA at 1-800-FDA-1088. Where should I keep my medication? This medication is given in a hospital or clinic. It will not be stored at home. NOTE: This sheet is a summary. It may not cover all possible  information. If you have questions about this medicine, talk to your doctor, pharmacist, or health care provider.  2024 Elsevier/Gold Standard (2022-02-16 00:00:00)

## 2024-05-01 NOTE — Progress Notes (Signed)
 Patient Care Team: Alvia Bring, DO as PCP - General (Family Medicine) Izell Domino, MD as Consulting Physician (Radiation Oncology) Buckley Arthea POUR, MD as Consulting Physician (Oncology) [facility] ([facility]hthalmology) Lesleigh, Mliss Grow, MD as Referring Physician (Dermatology) Terri Alan PARAS, MD as Referring Physician (Otolaryngology) Odean Potts, MD as Medical Oncologist (Hematology and Oncology) Vanderbilt Ned, MD as Consulting Physician (General Surgery)  DIAGNOSIS:  Encounter Diagnosis  Name Primary?   Malignant neoplasm of central portion of right breast in female, estrogen receptor negative (HCC) Yes    SUMMARY OF ONCOLOGIC HISTORY: Oncology History  Malignant neoplasm of central portion of right breast in female, estrogen receptor negative (HCC)  01/04/2012 Initial Diagnosis   Right breast cancer: Stage Ia ER/PR positive HER2 negative status postlumpectomy, radiation (low risk Oncotype) could not tolerate tamoxifen  for more than 30 days.   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 No pathogenic variants identified on the [facility] Multi-Cancer Panel+RNA. VUS in POLD1 identified called c.2429C>T. The report date is 07/01/2021.  The Multi-Cancer  Panel + RNA offered by [facility] includes sequencing and/or deletion duplication testing of the following 84 genes: AIP, ALK, APC, ATM, AXIN2,BAP1,  BARD1, BLM, BMPR1A, BRCA1, BRCA2, BRIP1, CASR, CDC73, CDH1, CDK4, CDKN1B, CDKN1C, CDKN2A (p14ARF), CDKN2A (p16INK4a), CEBPA, CHEK2, CTNNA1, DICER1, DIS3L2, EGFR (c.2369C>T, p.Thr790Met variant only), EPCAM (Deletion/duplication testing only), FH, FLCN, GATA2, GPC3, GREM1 (Promoter region deletion/duplication testing only), HOXB13 (c.251G>A, p.Gly84Glu), HRAS, KIT, MAX, MEN1, MET, MITF (c.952G>A, p.Glu318Lys variant only), MLH1, MSH2, MSH3, MSH6, MUTYH, NBN, NF1, NF2, NTHL1, PALB2, PDGFRA, PHOX2B, PMS2, POLD1, POLE, POT1, PRKAR1A, PTCH1, PTEN, RAD50, RAD51C, RAD51D, RB1, RECQL4, RET, RUNX1, SDHAF2, SDHA (sequence changes only), SDHB, SDHC, SDHD, SMAD4, SMARCA4, SMARCB1, SMARCE1, STK11, SUFU, TERC, TERT, TMEM127, TP53, TSC1, TSC2, VHL, WRN and WT1.   06/18/2021 Cancer Staging   Staging form: Breast, AJCC 8th Edition - Pathologic stage from 06/18/2021: Stage IA (pT1c, pN0, cM0, G3, ER-, PR-, HER2+) - Signed by Crawford Morna Pickle, NP on 10/05/2022 Stage prefix: Initial diagnosis Histologic grading system: 3 grade system   07/21/2021 - 07/06/2022 Chemotherapy   Patient is on Treatment Plan : BREAST Paclitaxel  + Trastuzumab  q7d / Trastuzumab  q21d     11/25/2021 - 01/04/2022 Radiation Therapy   Site Technique Total Dose (Gy) Dose per Fx (Gy) Completed Fx Beam Energies  Breast, Right: Breast_R 3D 45/45 1.8 25/25 6XFFF  Breast, Right: Breast_R_Bst specialPort 5.4/5.4 1.8 3/3 12E     12/29/2022 Progression   CT chest on 11/01/2022: Right upper lobe nodules largest measuring 5 mm CT chest 12/29/2022: Upper and mid lung zone predominant pulmonary nodules are worrisome for metastatic disease.  Left and right hepatic lobe masses (2.5 cm and 3.9 cm) are new and are worrisome for metastatic  disease.   01/13/2023 Initial Biopsy   Right liver biopsy: Metastatic carcinoma  with breast primary, ER-, PR-, HER2 +, Ki-67 50%.    01/13/2023 Cancer Staging   Staging form: Breast, AJCC 8th Edition - Pathologic stage from 01/13/2023: Stage IV (pM1, G3, ER-, PR-, HER2+) - Signed by Crawford Morna Pickle, NP on 02/07/2023 Histologic grading system: 3 grade system   01/17/2023 PET scan   PET CT scan 01/17/2023: Hypermetabolic bilobar hepatic (2 masses measuring 2.8 cm SUV 11.1, another mass 4.1 cm SUV 10.3) and pulmonary metastases (right upper lobe lung nodule 5 mm SUV 2.6 right lower lobe 6 mm nodule SUV 1.8)    02/07/2023 - 09/13/2023 Chemotherapy   Patient is on Treatment Plan : BREAST METASTATIC Fam-Trastuzumab Deruxtecan-nxki  (Enhertu ) (5.4) q21d     10/04/2023 -  Chemotherapy   Patient is on Treatment Plan : BREAST Trastuzumab  (8/6) IV D1 + Capecitabine  +  Tucatinib  q21d       CHIEF COMPLIANT: Follow-up on Xeloda  Tucatinib  and Herceptin   HISTORY OF PRESENT ILLNESS:   History of Present Illness Lori Mcmillan is a 63 year old female with metastatic breast cancer who presents with fatigue and gastrointestinal symptoms.  She experiences significant fatigue and diarrhea, occurring five to six times daily, exacerbated by consuming orange juice with Xeloda . Her current treatment includes Xeloda , tucatinib , and Herceptin .  A hard, painful area near the sternum, described as 'hard as it can be,' is present near the pectoral muscle, not in the breast area. She is uncertain if it is muscle or scar tissue.  She has undergone extensive radiation therapy, totaling ninety-three doses, and is concerned about its potential cancer-causing effects.     ALLERGIES:  has no known allergies.  MEDICATIONS:  Current Outpatient Medications  Medication Sig Dispense Refill   acetaminophen  (TYLENOL ) 500 MG tablet Take 500 mg by mouth as needed.     capecitabine  (XELODA ) 500 MG tablet Take 2 tablets (1000 mg) in AM and 1 tablets (500 mg) in PM by mouth every 12 hours. Take for 14  days on, 7 days off. Repeat every 21 days. 42 tablet 3   Famotidine -Ca Carb-Mag Hydrox (PEPCID  COMPLETE PO) Take by mouth as needed.     lidocaine -prilocaine  (EMLA ) cream Apply to affected area once 30 g 3   tucatinib  (TUKYSA ) 150 MG tablet Take 2 tablets (300 mg total) by mouth 2 (two) times daily. Take every 12 hrs at the same time each day with or without a meal. 120 tablet 3   No current facility-administered medications for this visit.    PHYSICAL EXAMINATION: ECOG PERFORMANCE STATUS: 1 - Symptomatic but completely ambulatory  Vitals:   05/01/24 0912  BP: 116/71  Pulse: 76  Resp: 17  Temp: 98 F (36.7 C)  SpO2: 98%   Filed Weights   05/01/24 0912  Weight: 130 lb 8 oz (59.2 kg)      LABORATORY DATA:  I have reviewed the data as listed    Latest Ref Rng & Units 03/20/2024   12:59 PM 02/07/2024    7:52 AM 12/27/2023   10:20 AM  CMP  Glucose 70 - 99 mg/dL 98  91  93   BUN 6 - 20 mg/dL 14  15  10    Creatinine 0.44 - 1.00 mg/dL 9.19  9.10  9.18   Sodium 135 - 145 mmol/L 140  140  138   Potassium 3.5 - 5.1 mmol/L 3.8  3.8  4.2   Chloride 98 - 111  mmol/L 107  108  107   CO2 22 - 32 mmol/L 28  24  26    Calcium 8.9 - 10.3 mg/dL 9.1  9.0  8.7   Total Protein 6.5 - 8.1 g/dL 6.6  6.5  6.5   Total Bilirubin 0.0 - 1.2 mg/dL 0.7  0.6  0.5   Alkaline Phos 38 - 126 U/L 103  117  118   AST 15 - 41 U/L 27  23  31    ALT 0 - 44 U/L 23  21  29      Lab Results  Component Value Date   WBC 6.2 03/20/2024   HGB 11.5 (L) 03/20/2024   HCT 33.9 (L) 03/20/2024   MCV 99.4 03/20/2024   PLT 156 03/20/2024   NEUTROABS 4.0 03/20/2024    ASSESSMENT & PLAN:  Malignant neoplasm of central portion of right breast in female, estrogen receptor negative (HCC) 06/18/2021:Right lumpectomy: 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  Adjuvant chemotherapy with Taxol  and Herceptin  followed by Herceptin  maintenance completed 07/06/2022 2. breast radiation completed 01/04/2022 3.  Metastatic disease diagnosis:CT chest on 11/01/2022: Right upper lobe nodules largest measuring 5 mm, 01/13/23: Liver Biopsy: Met breast cancer ER 0%, PR 0%, Ki67 50%, HER2 3+  4.  Enhertu  02/07/2023-09/13/2023 --------------------------------------------------------------------------------------------------------------------- PET/CT 09/30/2023: Progression of liver metastasis (3.4 cm was 1.7 cm, 2.3 cm was 1.2 cm, additional liver lesions 1.2 cm and 1.3 cm), lung nodule 0.9 cm was 0.6 cm  Current treatment: Herceptin , Tucatinib , capecitabine  started December 2024 Chemo toxicities: Slight rash on the hands left send chest: Reduce the dosage of Xeloda  to 1000 mg in the morning and 500 mg in the evening.:  Continues to moisturize and keep the hands and feet hydrated     ECHO 11/25/2023: EF 50-55% CT CAP 01/15/2024: Liver lesions diminished in size (1.6 cm was 3.4 cm, 1.6 cm was 2.3 cm), diminished size of the right lower lobe lung nodule 0.6 cm was 0.9 cm CT CAP 04/04/2024: Stable hepatic metastases Echo: June 2025: EF 55% normal    echocardiograms will be done every 6 months from this point onwards. RTC in 3 weeks for labs, f/u, and treatment. Plans to obtain scans every 4 months which will be in October 2025. ------------------------------------- Assessment and Plan Assessment & Plan Cancer treatment monitoring Significant past radiation exposure increases cancer risk. Balancing disease progression monitoring with minimizing radiation exposure is crucial. - Schedule the next scan in October, extending the interval to 3-4 months.  Fatigue Fatigue likely related to ongoing cancer treatment with Capecitabine  (Xeloda ), Tucatinib  (Tukysa ), and Herceptin .  Common side effect of chemotherapy and targeted therapy. - Reduce Capecitabine  (Xeloda ) dosage to 500 mg in the morning and 500 mg in the evening.  Diarrhea Diarrhea occurs 5-6 times a day, likely a side effect of Capecitabine  (Xeloda ). Consumption of orange juice with the medication may exacerbate symptoms. - Reduce Capecitabine  (Xeloda ) dosage to 500 mg in the morning and 500 mg in the evening. - Advise to avoid consuming orange juice with Capecitabine  (Xeloda ).      No orders of the defined types were placed in this encounter.  The patient has a good understanding of the overall plan. she agrees with it. she  will call with any problems that may develop before the next visit here. Total time spent: 30 mins including face to face time and time spent for planning, charting and co-ordination of care   Viinay K Quitman Norberto, MD 05/01/24

## 2024-05-01 NOTE — Assessment & Plan Note (Signed)
 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  Adjuvant chemotherapy with Taxol  and Herceptin  followed by Herceptin  maintenance completed 07/06/2022 2. breast radiation completed 01/04/2022 3.  Metastatic disease diagnosis:CT chest on 11/01/2022: Right upper lobe nodules largest measuring 5 mm, 01/13/23: Liver Biopsy: Met breast cancer ER 0%, PR 0%, Ki67 50%, HER2 3+  4.  Enhertu  02/07/2023-09/13/2023 --------------------------------------------------------------------------------------------------------------------- PET/CT 09/30/2023: Progression of liver metastasis (3.4 cm was 1.7 cm, 2.3 cm was 1.2 cm, additional liver lesions 1.2 cm and 1.3 cm), lung nodule 0.9 cm was 0.6 cm  Current treatment: Herceptin , Tucatinib , capecitabine  started December 2024 Chemo toxicities: Slight rash on the hands left send chest: Reduce the dosage of Xeloda  to 1000 mg in the morning and 500 mg in the evening.:  Continues to moisturize and keep the hands and feet hydrated     ECHO 11/25/2023: EF 50-55% CT CAP 01/15/2024: Liver lesions diminished in size (1.6 cm was 3.4 cm, 1.6 cm was 2.3 cm), diminished size of the right lower lobe lung nodule 0.6 cm was 0.9 cm CT CAP 04/04/2024: Stable hepatic metastases Echo: June 2025: EF 55% normal    echocardiograms will be done every 6 months from this point onwards. RTC in 3 weeks for labs, f/u, and treatment.

## 2024-05-03 DIAGNOSIS — D1800 Hemangioma unspecified site: Secondary | ICD-10-CM | POA: Diagnosis not present

## 2024-05-09 ENCOUNTER — Other Ambulatory Visit: Payer: Self-pay

## 2024-05-09 ENCOUNTER — Encounter (INDEPENDENT_AMBULATORY_CARE_PROVIDER_SITE_OTHER): Payer: Self-pay

## 2024-05-09 NOTE — Progress Notes (Signed)
 Specialty Pharmacy Refill Coordination Note  Lori Mcmillan is a 63 y.o. female contacted today regarding refills of specialty medication(s) Tucatinib  (TUKYSA )   Patient requested (Patient-Rptd) Pickup at [facility] date: (Patient-Rptd) 05/22/24   Medication will be filled on 08.04.25.

## 2024-05-10 ENCOUNTER — Ambulatory Visit

## 2024-05-10 VITALS — Ht 64.0 in | Wt 130.0 lb

## 2024-05-10 DIAGNOSIS — Z0000 Encounter for general adult medical examination without abnormal findings: Secondary | ICD-10-CM | POA: Diagnosis not present

## 2024-05-10 NOTE — Progress Notes (Signed)
 Subjective:   Lori Mcmillan is a 63 y.o. female who presents for Medicare Annual (Subsequent) preventive examination.  Visit Complete: Virtual I connected with  Lori Mcmillan on 05/10/24 by a audio enabled telemedicine application and verified that I am speaking with the correct person using two identifiers.  Patient Location: Home  Provider Location: Office/Clinic  I discussed the limitations of evaluation and management by telemedicine. The patient expressed understanding and agreed to proceed.  Vital Signs: Because this visit was a virtual/telehealth visit, some criteria may be missing or patient reported. Any vitals not documented were not able to be obtained and vitals that have been documented are patient reported.  Patient Medicare AWV questionnaire was completed by the patient on 05/09/24; I have confirmed that all information answered by patient is correct and no changes since this date.  Cardiac Risk Factors include: advanced age (>49men, >51 women);smoking/ tobacco exposure     Objective:    Today's Vitals   05/10/24 1101  Weight: 130 lb (59 kg)  Height: 5' 4 (1.626 m)   Body mass index is 22.31 kg/m.     05/10/2024   11:28 AM 05/01/2024    9:07 AM 04/10/2024    9:03 AM 07/12/2023    8:32 AM 06/21/2023   10:32 AM 04/25/2023   10:39 AM 04/12/2023   11:04 AM  Advanced Directives  Does Patient Have a Medical Advance Directive? No No No No No Yes No  Type of Science writer of [employer] Power of [place];Living will  Healthcare Power of [place];Living will   Would patient like information on creating a medical advance directive? No - Patient declined No - Patient declined No - Patient declined No - Patient declined No - Patient declined  No - Patient declined    Current Medications (verified) Outpatient Encounter Medications as of 05/10/2024  Medication Sig   acetaminophen  (TYLENOL ) 500 MG tablet Take  500 mg by mouth as needed.   capecitabine  (XELODA ) 500 MG tablet Take 1 tablets (500 mg) in AM and 1 tablets (500 mg) in PM by mouth every 12 hours. Take for 14 days on, 7 days off. Repeat every 21 days.   Famotidine -Ca Carb-Mag Hydrox (PEPCID  COMPLETE PO) Take by mouth as needed.   lidocaine -prilocaine  (EMLA ) cream Apply to affected area once   tucatinib  (TUKYSA ) 150 MG tablet Take 2 tablets (300 mg total) by mouth 2 (two) times daily. Take every 12 hrs at the same time each day with or without a meal.   No facility-administered encounter medications on file as of 05/10/2024.    Allergies (verified) Patient has no known allergies.   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  No sign of residual dz at 04/2018 rad onc f/u and 10/2018 neuro-onc f/u. 03/2019 MRI brain->no resid/no recurrence.   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   Chicken pox    Depression    zoloft  in past--?wt gain.   Diplopia    chronic (meningioma-related)   Eustachian tube dysfunction 09/05/2013   Family history of breast cancer    Family history of colon cancer    Family history  of melanoma    Family history of ovarian cancer    Family history of pancreatic cancer    GERD (gastroesophageal reflux disease)    History of radiation therapy 02/24/12-04/11/12   right breast/ 45Gy@1 .8Gyx26fx/boost=16Gy@2  Gya3fx.  Latest mammo and u/s 03/2013--normal.   History of radiation therapy 11/30/17- 01/11/18   Right temporal lobe treated to 55.8 Gy with 31 fx of 1.8 Gy   Hx of basal cell carcinoma    Migraine    Palpitations    has taken Metoprolol for palpitations in the past.   Seizure (HCC)    2 YEARS AGO,UPDATED 09/07/22   Taste impairment    s/p  radiation therapy   Tobacco dependence 09/05/2013   Past Surgical History:  Procedure Laterality Date   APPENDECTOMY  2008   emergency   BRAIN TUMOR EXCISION  2018   Meningioma   BREAST LUMPECTOMY Right 01/04/2012   right breast   BREAST LUMPECTOMY WITH RADIOACTIVE SEED AND SENTINEL LYMPH NODE BIOPSY Right 06/18/2021   Procedure: RIGHT BREAST LUMPECTOMY WITH RADIOACTIVE SEED AND SENTINEL LYMPH NODE BIOPSY;  Surgeon: Vanderbilt Ned, MD;  Location: [facility];  Service: General;  Laterality: Right;   COLONOSCOPY     2014 ?   CRANIECTOMY  10/19/2017   at [facility]   IR IMAGING GUIDED PORT INSERTION  02/02/2023   PORTACATH PLACEMENT Right 06/18/2021   Procedure: INSERTION PORT-A-CATH;  Surgeon: Vanderbilt Ned, MD;  Location: [facility];  Service: General;  Laterality: Right;   PORTACATH REMOVED     OCTOBER 10,2023   WISDOM TOOTH EXTRACTION  1990   Family History  Problem Relation Age of Onset   Anesthesia problems Mother    Breast cancer Mother 57       lumpectomy, chemo, radiation   Colon polyps Brother    Ovarian cancer Maternal Aunt 24   Cancer Maternal Aunt        unknown type   Breast cancer Maternal Aunt 80   Breast cancer Maternal Aunt 40       bilateral   Pancreatic cancer Maternal Uncle        dx 63s   Stomach cancer Paternal Uncle    Cancer Paternal Uncle        unknown type   Lung cancer Paternal Uncle        hx smoking   Dementia Maternal Grandmother    Lung cancer Maternal Grandfather        hx smoking   Pancreatic cancer Paternal Grandmother 15   Melanoma Paternal Grandmother        dx >50, shin   Heart Problems Paternal Grandfather    Colon cancer Cousin 24       maternal first cousin   Breast cancer Cousin        dx 8s, paternal first cousin   Crohn's disease Neg Hx    Esophageal cancer Neg Hx    Rectal cancer Neg Hx    Ulcerative colitis Neg Hx    Social History   Socioeconomic History   Marital status:  Single    Spouse name: Not on file   Number of children: Not on file   Years of education: Not on file   Highest education level: Bachelor's degree (e.g., BA, AB, BS)  Occupational History   Not on file  Tobacco Use   Smoking status: Former    Current packs/day: 0.00    Types: Cigarettes    Quit date: 10/17/2017  Years since quitting: 6.5    Passive exposure: Never   Smokeless tobacco: Never  Vaping Use   Vaping status: Former  Substance and Sexual Activity   Alcohol use: Yes    Alcohol/week: 3.0 standard drinks of alcohol    Types: 3 Cans of beer per week    Comment: 3-4 beers/day   Drug use: Not Currently   Sexual activity: Not Currently    Partners: Male  Other Topics Concern   Not on file  Social History Narrative   Marital status/children/pets: Single.  No children.  Lives alone.  Has pets.Orig from [employer] in [place].   Education/employment: Oncologist, retired   Field seismologist:      -smoke alarm in the home:Yes     - wears seatbelt: Yes      Enjoys hiking, reading and cooking.          Social Drivers of Health   Financial Resource Strain: Medium Risk (05/10/2024)   Overall Financial Resource Strain (CARDIA)    Difficulty of Paying Living Expenses: Somewhat hard  Food Insecurity: No Food Insecurity (05/10/2024)   Hunger Vital Sign    Worried About Running Out of Food in the Last Year: Never true    Ran Out of Food in the Last Year: Never true  Recent Concern: Food Insecurity - Food Insecurity Present (05/09/2024)   Hunger Vital Sign    Worried About Running Out of Food in the Last Year: Sometimes true    Ran Out of Food in the Last Year: Never true  Transportation Needs: No Transportation Needs (05/10/2024)   PRAPARE - Administrator, Civil Service (Medical): No    Lack of Transportation (Non-Medical): No  Physical Activity: Insufficiently Active (05/10/2024)   Exercise Vital Sign    Days of Exercise per Week: 3 days    Minutes of Exercise per  Session: 30 min  Stress: Stress Concern Present (05/10/2024)   [employer] of Occupational Health - Occupational Stress Questionnaire    Feeling of Stress: To some extent  Social Connections: Socially Isolated (05/10/2024)   Social Connection and Isolation Panel    Frequency of Communication with Friends and Family: More than three times a week    Frequency of Social Gatherings with Friends and Family: Twice a week    Attends Religious Services: Never    Database administrator or Organizations: No    Attends Engineer, structural: Never    Marital Status: Never married    Tobacco Counseling Counseling given: Not Answered   Clinical Intake:  Pre-visit preparation completed: Yes  Pain : No/denies pain     BMI - recorded: 22.31 Nutritional Risks: None Diabetes: No  How often do you need to have someone help you when you read instructions, pamphlets, or other written materials from your doctor or pharmacy?: 1 - Never What is the last grade level you completed in school?: 16  Interpreter Needed?: No      Activities of Daily Living    05/10/2024   11:02 AM 05/09/2024   11:06 AM  In your present state of health, do you have any difficulty performing the following activities:  Hearing? 0 0  Vision? 1 1  Difficulty concentrating or making decisions? 1 1  Walking or climbing stairs? 0   Dressing or bathing? 0 0  Doing errands, shopping? 0 0  Preparing Food and eating ? Y Y  Comment chemo   Using the Toilet? N N  In the  past six months, have you accidently leaked urine? N   Do you have problems with loss of bowel control? Y Y  Managing your Medications? N N  Managing your Finances? Y Y  Housekeeping or managing your Housekeeping? CINDERELLA CINDERELLA    Patient Care Team: Alvia Bring, DO as PCP - General (Family Medicine) Izell Domino, MD as Consulting Physician (Radiation Oncology) Buckley Zachary K, MD as Consulting Physician (Oncology) [facility]  (Ophthalmology) Terri Alan PARAS, MD as Referring Physician (Otolaryngology) Odean Potts, MD as Medical Oncologist (Hematology and Oncology) Vanderbilt Ned, MD as Consulting Physician (General Surgery) Georgeff, Christopher S, PA-C (Dermatology)  Indicate any recent Medical Services you may have received from other than [facility] providers in the past year (date may be approximate).     Assessment:   This is a routine wellness examination for Sammi.  Hearing/Vision screen No results found.   Goals Addressed             This Visit's Progress    Patient Stated       Patient states she would like to find some exercise in June, July and August.        Depression Screen    05/10/2024   11:21 AM 03/03/2023    8:52 AM 05/01/2021    4:22 PM 01/20/2021    9:39 AM 05/03/2018   11:09 AM 02/24/2018   10:51 AM 11/16/2017    9:41 AM  PHQ 2/9 Scores  PHQ - 2 Score 0 1 1 0 0 3 0  PHQ- 9 Score   4        Fall Risk    05/10/2024   11:28 AM 05/09/2024   11:06 AM 03/03/2023    8:52 AM 05/01/2021    4:22 PM 05/03/2018   11:09 AM  Fall Risk   Falls in the past year? 1 1 0 1 No   Number falls in past yr: 1 0 0 1   Injury with Fall? 0 0 0 1   Risk for fall due to : History of fall(s)  No Fall Risks Impaired mobility   Follow up Falls evaluation completed  Falls evaluation completed Falls evaluation completed       Data saved with a previous flowsheet row definition    MEDICARE RISK AT HOME: Medicare Risk at Home Any stairs in or around the home?: Yes If so, are there any without handrails?: Yes Home free of loose throw rugs in walkways, pet beds, electrical cords, etc?: No Adequate lighting in your home to reduce risk of falls?: Yes Life alert?: No Use of a cane, walker or w/c?: No Grab bars in the bathroom?: No Shower chair or bench in shower?: No Elevated toilet seat or a handicapped toilet?: No  TIMED UP AND GO:  Was the test performed?  No    Cognitive Function:         05/10/2024   11:32 AM  6CIT Screen  What Year? 0 points  What month? 0 points  What time? 0 points  Count back from 20 0 points  Months in reverse 0 points  Repeat phrase 0 points  Total Score 0 points    Immunizations Immunization History  Administered Date(s) Administered   Influenza, Seasonal, Injecte, Preservative Fre 07/20/2023   Influenza,inj,Quad PF,6+ Mos 09/05/2013, 09/05/2013, 10/22/2017, 10/22/2017, 11/23/2019, 11/23/2019, 08/25/2021, 10/21/2022   PFIZER(Purple Top)SARS-COV-2 Vaccination 01/07/2020, 01/28/2020   Tdap 03/06/2019   Zoster Recombinant(Shingrix ) 07/20/2023, 11/10/2023    TDAP status: Up to date  Flu Vaccine status: Up to date  Pneumococcal vaccine status: Due, Education has been provided regarding the importance of this vaccine. Advised may receive this vaccine at local pharmacy or Health Dept. Aware to provide a copy of the vaccination record if obtained from local pharmacy or Health Dept. Verbalized acceptance and understanding.  Covid-19 vaccine status: Completed vaccines  Qualifies for Shingles Vaccine? Yes   Zostavax completed No   Shingrix  Completed?: Yes  Screening Tests Health Maintenance  Topic Date Due   HIV Screening  Never done   Pneumococcal Vaccine 38-7 Years old (1 of 2 - PCV) Never done   COVID-19 Vaccine (3 - Pfizer risk series) 02/25/2020   INFLUENZA VACCINE  05/18/2024   Medicare Annual Wellness (AWV)  05/10/2025   MAMMOGRAM  05/15/2025   Cervical Cancer Screening (HPV/Pap Cotest)  06/11/2027   DTaP/Tdap/Td (2 - Td or Tdap) 03/05/2029   Colonoscopy  09/29/2029   Hepatitis C Screening  Completed   Zoster Vaccines- Shingrix   Completed   Hepatitis B Vaccines  Aged Out   HPV VACCINES  Aged Out   Meningococcal B Vaccine  Aged Out    Health Maintenance  Health Maintenance Due  Topic Date Due   HIV Screening  Never done   Pneumococcal Vaccine 36-30 Years old (1 of 2 - PCV) Never done   COVID-19 Vaccine (3 - Pfizer  risk series) 02/25/2020    Colorectal cancer screening: Type of screening: Colonoscopy. Completed 09/29/2022. Repeat every 7 years  Mammogram status: No longer required due to cancer.   Lung Cancer Screening: (Low Dose CT Chest recommended if Age 45-80 years, 20 pack-year currently smoking OR have quit w/in 15years.) does qualify.   Lung Cancer Screening Referral:   Additional Screening:  Hepatitis C Screening: does qualify; Completed 03/06/2019  Vision Screening: Recommended annual ophthalmology exams for early detection of glaucoma and other disorders of the eye. Is the patient up to date with their annual eye exam?  Yes  Who is the provider or what is the name of the office in which the patient attends annual eye exams? Dr Linell If pt is not established with a provider, would they like to be referred to a provider to establish care? N/a.   Dental Screening: Recommended annual dental exams for proper oral hygiene   Community Resource Referral / Chronic Care Management: CRR required this visit?  No   CCM required this visit?  No     Plan:     I have personally reviewed and noted the following in the patient's chart:   Medical and social history Use of alcohol, tobacco or illicit drugs  Current medications and supplements including opioid prescriptions. Patient is not currently taking opioid prescriptions. Functional ability and status Nutritional status Physical activity Advanced directives List of other physicians Hospitalizations, surgeries, and ER visits in previous 12 months. None Vitals Screenings to include cognitive, depression, and falls Referrals and appointments  In addition, I have reviewed and discussed with patient certain preventive protocols, quality metrics, and best practice recommendations. A written personalized care plan for preventive services as well as general preventive health recommendations were provided to patient.     Ludivina, [place], CMA   05/10/2024   After Visit Summary: (MyChart) Due to this being a telephonic visit, the after visit summary with patients personalized plan was offered to patient via MyChart   Nurse Notes:   ANE CONERLY is a 63 y.o. female patient of Velma Ku, DO who had a  Medicare Annual Wellness Visit today via telephone. She reports that he is socially active and does interact with friends/family regularly. She is moderately physically active. She enjoys hiking and reading.

## 2024-05-10 NOTE — Patient Instructions (Signed)
  Lori Mcmillan , Thank you for taking time to come for your Medicare Wellness Visit. I appreciate your ongoing commitment to your health goals. Please review the following plan we discussed and let me know if I can assist you in the future.   These are the goals we discussed:  Goals      Maintain optimal adherence to therapy     Patient is on track. Patient will maintain adherence      Patient Stated     Patient states she would like to find some exercise in June, July and August.         This is a list of the screening recommended for you and due dates:  Health Maintenance  Topic Date Due   HIV Screening  Never done   Pneumococcal Vaccination (1 of 2 - PCV) Never done   COVID-19 Vaccine (3 - Pfizer risk series) 02/25/2020   Flu Shot  05/18/2024   Medicare Annual Wellness Visit  05/10/2025   Mammogram  05/15/2025   Pap with HPV screening  06/11/2027   DTaP/Tdap/Td vaccine (2 - Td or Tdap) 03/05/2029   Colon Cancer Screening  09/29/2029   Hepatitis C Screening  Completed   Zoster (Shingles) Vaccine  Completed   Hepatitis B Vaccine  Aged Out   HPV Vaccine  Aged Out   Meningitis B Vaccine  Aged Out

## 2024-05-15 ENCOUNTER — Other Ambulatory Visit: Payer: Self-pay

## 2024-05-16 ENCOUNTER — Other Ambulatory Visit: Payer: Self-pay

## 2024-05-16 ENCOUNTER — Encounter (INDEPENDENT_AMBULATORY_CARE_PROVIDER_SITE_OTHER): Payer: Self-pay

## 2024-05-16 NOTE — Progress Notes (Signed)
 Specialty Pharmacy Refill Coordination Note  Lori Mcmillan is a 63 y.o. female contacted today regarding refills of specialty medication(s) Capecitabine  (XELODA )   Patient requested (Patient-Rptd) Pickup at [facility] at [address] date: 05/17/24   Medication will be filled on 05/16/24.

## 2024-05-22 ENCOUNTER — Inpatient Hospital Stay: Attending: Hematology and Oncology

## 2024-05-22 ENCOUNTER — Inpatient Hospital Stay (HOSPITAL_BASED_OUTPATIENT_CLINIC_OR_DEPARTMENT_OTHER): Admitting: Hematology and Oncology

## 2024-05-22 VITALS — BP 122/67 | HR 76 | Temp 97.9°F | Resp 16 | Ht 64.0 in | Wt 131.0 lb

## 2024-05-22 DIAGNOSIS — C7800 Secondary malignant neoplasm of unspecified lung: Secondary | ICD-10-CM | POA: Diagnosis not present

## 2024-05-22 DIAGNOSIS — Z923 Personal history of irradiation: Secondary | ICD-10-CM | POA: Diagnosis not present

## 2024-05-22 DIAGNOSIS — Z171 Estrogen receptor negative status [ER-]: Secondary | ICD-10-CM | POA: Diagnosis not present

## 2024-05-22 DIAGNOSIS — C50111 Malignant neoplasm of central portion of right female breast: Secondary | ICD-10-CM | POA: Insufficient documentation

## 2024-05-22 DIAGNOSIS — Z1722 Progesterone receptor negative status: Secondary | ICD-10-CM | POA: Insufficient documentation

## 2024-05-22 DIAGNOSIS — Z1731 Human epidermal growth factor receptor 2 positive status: Secondary | ICD-10-CM | POA: Diagnosis not present

## 2024-05-22 DIAGNOSIS — C787 Secondary malignant neoplasm of liver and intrahepatic bile duct: Secondary | ICD-10-CM | POA: Diagnosis not present

## 2024-05-22 DIAGNOSIS — Z5112 Encounter for antineoplastic immunotherapy: Secondary | ICD-10-CM | POA: Insufficient documentation

## 2024-05-22 LAB — CMP (CANCER CENTER ONLY)
ALT: 33 U/L (ref 0–44)
AST: 33 U/L (ref 15–41)
Albumin: 4.1 g/dL (ref 3.5–5.0)
Alkaline Phosphatase: 115 U/L (ref 38–126)
Anion gap: 5 (ref 5–15)
BUN: 12 mg/dL (ref 8–23)
CO2: 26 mmol/L (ref 22–32)
Calcium: 9 mg/dL (ref 8.9–10.3)
Chloride: 106 mmol/L (ref 98–111)
Creatinine: 0.72 mg/dL (ref 0.44–1.00)
GFR, Estimated: >60 mL/min (ref >60)
Glucose, Bld: 98 mg/dL (ref 70–99)
Potassium: 3.9 mmol/L (ref 3.5–5.1)
Sodium: 137 mmol/L (ref 135–145)
Total Bilirubin: 0.5 mg/dL (ref 0.0–1.2)
Total Protein: 6.6 g/dL (ref 6.5–8.1)

## 2024-05-22 LAB — CBC WITH DIFFERENTIAL (CANCER CENTER ONLY)
Abs Immature Granulocytes: 0.01 K/uL (ref 0.00–0.07)
Basophils Absolute: 0 K/uL (ref 0.0–0.1)
Basophils Relative: 1 %
Eosinophils Absolute: 0.1 K/uL (ref 0.0–0.5)
Eosinophils Relative: 2 %
HCT: 34.1 % — ABNORMAL LOW (ref 36.0–46.0)
Hemoglobin: 11.8 g/dL — ABNORMAL LOW (ref 12.0–15.0)
Immature Granulocytes: 0 %
Lymphocytes Relative: 29 %
Lymphs Abs: 2.1 K/uL (ref 0.7–4.0)
MCH: 34 pg (ref 26.0–34.0)
MCHC: 34.6 g/dL (ref 30.0–36.0)
MCV: 98.3 fL (ref 80.0–100.0)
Monocytes Absolute: 0.6 K/uL (ref 0.1–1.0)
Monocytes Relative: 8 %
Neutro Abs: 4.3 K/uL (ref 1.7–7.7)
Neutrophils Relative %: 60 %
Platelet Count: 202 K/uL (ref 150–400)
RBC: 3.47 MIL/uL — ABNORMAL LOW (ref 3.87–5.11)
RDW: 14.6 % (ref 11.5–15.5)
WBC Count: 7.2 K/uL (ref 4.0–10.5)
nRBC: 0 % (ref 0.0–0.2)

## 2024-05-22 MED ORDER — DIPHENHYDRAMINE HCL 25 MG PO CAPS
25.0000 mg | ORAL_CAPSULE | Freq: Once | ORAL | Status: AC
  Administered 2024-05-22: 25 mg via ORAL
  Filled 2024-05-22: qty 1

## 2024-05-22 MED ORDER — TRASTUZUMAB CHEMO 150 MG IV SOLR
6.0000 mg/kg | Freq: Once | INTRAVENOUS | Status: AC
  Administered 2024-05-22: 378 mg via INTRAVENOUS
  Filled 2024-05-22: qty 18

## 2024-05-22 MED ORDER — SODIUM CHLORIDE 0.9 % IV SOLN: INTRAVENOUS | Status: DC

## 2024-05-22 MED ORDER — ACETAMINOPHEN 325 MG PO TABS
650.0000 mg | ORAL_TABLET | Freq: Once | ORAL | Status: AC
  Administered 2024-05-22: 650 mg via ORAL
  Filled 2024-05-22: qty 2

## 2024-05-22 MED ORDER — SODIUM CHLORIDE 0.9% FLUSH: 10.0000 mL | INTRAVENOUS | Status: DC | PRN

## 2024-05-22 NOTE — Assessment & Plan Note (Signed)
 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  Adjuvant chemotherapy with Taxol  and Herceptin  followed by Herceptin  maintenance completed 07/06/2022 2. breast radiation completed 01/04/2022 3.  Metastatic disease diagnosis:CT chest on 11/01/2022: Right upper lobe nodules largest measuring 5 mm, 01/13/23: Liver Biopsy: Met breast cancer ER 0%, PR 0%, Ki67 50%, HER2 3+  4.  Enhertu  02/07/2023-09/13/2023 --------------------------------------------------------------------------------------------------------------------- PET/CT 09/30/2023: Progression of liver metastasis (3.4 cm was 1.7 cm, 2.3 cm was 1.2 cm, additional liver lesions 1.2 cm and 1.3 cm), lung nodule 0.9 cm was 0.6 cm  Current treatment: Herceptin , Tucatinib , capecitabine  started December 2024 Chemo toxicities: Slight rash on the hands left send chest: Reduce the dosage of Xeloda  to 1000 mg in the morning and 500 mg in the evening.:  Continues to moisturize and keep the hands and feet hydrated     ECHO 11/25/2023: EF 50-55% CT CAP 01/15/2024: Liver lesions diminished in size (1.6 cm was 3.4 cm, 1.6 cm was 2.3 cm), diminished size of the right lower lobe lung nodule 0.6 cm was 0.9 cm CT CAP 04/04/2024: Stable hepatic metastases Echo: June 2025: EF 55% normal    echocardiograms will be done every 6 months from this point onwards. RTC in 3 weeks for labs, f/u, and treatment. Plans to obtain scans every 4 months which will be in October 2025.

## 2024-05-22 NOTE — Patient Instructions (Signed)

## 2024-05-22 NOTE — Progress Notes (Signed)
 Patient Care Team: Alvia Bring, DO as PCP - General (Family Medicine) Izell Domino, MD as Consulting Physician (Radiation Oncology) Buckley, Arthea POUR, MD as Consulting Physician (Oncology) [facility] [place] [facility] ([facility]) Terri Alan PARAS, MD as Referring Physician (Otolaryngology) Odean Potts, MD as Medical Oncologist (Hematology and Oncology) Vanderbilt Ned, MD as Consulting Physician (General Surgery) Georgeff, Christopher S, PA-C (Dermatology)  DIAGNOSIS:  Encounter Diagnosis  Name Primary?   Malignant neoplasm of central portion of right breast in female, estrogen receptor negative (HCC) Yes    SUMMARY OF ONCOLOGIC HISTORY: Oncology History  Malignant neoplasm of central portion of right breast in female, estrogen receptor negative (HCC)  01/04/2012 Initial Diagnosis   Right breast cancer: Stage Ia ER/PR positive HER2 negative status postlumpectomy, radiation (low risk Oncotype) could not tolerate tamoxifen  for more than 30 days.   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 No pathogenic variants identified on the [facility] Multi-Cancer Panel+RNA. VUS in POLD1 identified called c.2429C>T. The report date is 07/01/2021.  The Multi-Cancer Panel + RNA  offered by [facility] includes sequencing and/or deletion duplication testing of the following 84 genes: AIP, ALK, APC, ATM, AXIN2,BAP1,  BARD1, BLM, BMPR1A, BRCA1, BRCA2, BRIP1, CASR, CDC73, CDH1, CDK4, CDKN1B, CDKN1C, CDKN2A (p14ARF), CDKN2A (p16INK4a), CEBPA, CHEK2, CTNNA1, DICER1, DIS3L2, EGFR (c.2369C>T, p.Thr790Met variant only), EPCAM (Deletion/duplication testing only), FH, FLCN, GATA2, GPC3, GREM1 (Promoter region deletion/duplication testing only), HOXB13 (c.251G>A, p.Gly84Glu), HRAS, KIT, MAX, MEN1, MET, MITF (c.952G>A, p.Glu318Lys variant only), MLH1, MSH2, MSH3, MSH6, MUTYH, NBN, NF1, NF2, NTHL1, PALB2, PDGFRA, PHOX2B, PMS2, POLD1, POLE, POT1, PRKAR1A, PTCH1, PTEN, RAD50, RAD51C, RAD51D, RB1, RECQL4, RET, RUNX1, SDHAF2, SDHA (sequence changes only), SDHB, SDHC, SDHD, SMAD4, SMARCA4, SMARCB1, SMARCE1, STK11, SUFU, TERC, TERT, TMEM127, TP53, TSC1, TSC2, VHL, WRN and WT1.   06/18/2021 Cancer Staging   Staging form: Breast, AJCC 8th Edition - Pathologic stage from 06/18/2021: Stage IA (pT1c, pN0, cM0, G3, ER-, PR-, HER2+) - Signed by Crawford Morna Pickle, NP on 10/05/2022 Stage prefix: Initial diagnosis Histologic grading system: 3 grade system   07/21/2021 - 07/06/2022 Chemotherapy   Patient is on Treatment Plan : BREAST Paclitaxel  + Trastuzumab  q7d / Trastuzumab  q21d     11/25/2021 - 01/04/2022 Radiation Therapy   Site Technique Total Dose (Gy) Dose per Fx (Gy) Completed Fx Beam Energies  Breast, Right: Breast_R 3D 45/45 1.8 25/25 6XFFF  Breast, Right: Breast_R_Bst specialPort 5.4/5.4 1.8 3/3 12E     12/29/2022 Progression   CT chest on 11/01/2022: Right upper lobe nodules largest measuring 5 mm CT chest 12/29/2022: Upper and mid lung zone predominant pulmonary nodules are worrisome for metastatic disease.  Left and right hepatic lobe masses (2.5 cm and 3.9 cm) are new and are worrisome for metastatic disease.  01/13/2023 Initial Biopsy   Right liver biopsy: Metastatic carcinoma with breast  primary, ER-, PR-, HER2 +, Ki-67 50%.    01/13/2023 Cancer Staging   Staging form: Breast, AJCC 8th Edition - Pathologic stage from 01/13/2023: Stage IV (pM1, G3, ER-, PR-, HER2+) - Signed by Crawford Morna Pickle, NP on 02/07/2023 Histologic grading system: 3 grade system   01/17/2023 PET scan   PET CT scan 01/17/2023: Hypermetabolic bilobar hepatic (2 masses measuring 2.8 cm SUV 11.1, another mass 4.1 cm SUV 10.3) and pulmonary metastases (right upper lobe lung nodule 5 mm SUV 2.6 right lower lobe 6 mm nodule SUV 1.8)    02/07/2023 - 09/13/2023 Chemotherapy   Patient is on Treatment Plan : BREAST METASTATIC Fam-Trastuzumab Deruxtecan-nxki  (Enhertu ) (5.4) q21d     10/04/2023 -  Chemotherapy   Patient is on Treatment Plan : BREAST Trastuzumab  (8/6) IV D1 + Capecitabine  +  Tucatinib  q21d       CHIEF COMPLIANT: Follow-up on Herceptin  Tucatinib  and Xeloda   HISTORY OF PRESENT ILLNESS:  History of Present Illness Lori Mcmillan is a 63 year old female with metastatic breast cancer who presents for routine follow-up and Herceptin  infusion.  She is undergoing treatment with Herceptin , Xeloda , and Tukysa  for metastatic breast cancer. Her energy levels have improved, though she still experiences fatigue, especially in the evenings. Her lower gastrointestinal symptoms have stabilized but persist. She has not required Xanax  for over six weeks.     ALLERGIES:  has no known allergies.  MEDICATIONS:  Current Outpatient Medications  Medication Sig Dispense Refill   acetaminophen  (TYLENOL ) 500 MG tablet Take 500 mg by mouth as needed.     capecitabine  (XELODA ) 500 MG tablet Take 1 tablets (500 mg) in AM and 1 tablets (500 mg) in PM by mouth every 12 hours. Take for 14 days on, 7 days off. Repeat every 21 days. 42 tablet 3   Famotidine -Ca Carb-Mag Hydrox (PEPCID  COMPLETE PO) Take by mouth as needed.     lidocaine -prilocaine  (EMLA ) cream Apply to affected area once 30 g 3   tucatinib  (TUKYSA ) 150  MG tablet Take 2 tablets (300 mg total) by mouth 2 (two) times daily. Take every 12 hrs at the same time each day with or without a meal. 120 tablet 3   No current facility-administered medications for this visit.    PHYSICAL EXAMINATION: ECOG PERFORMANCE STATUS: 1 - Symptomatic but completely ambulatory  Vitals:   05/22/24 1039  BP: 122/67  Pulse: 76  Resp: 16  Temp: 97.9 F (36.6 C)  SpO2: 99%   Filed Weights   05/22/24 1039  Weight: 131 lb (59.4 kg)      LABORATORY DATA:  I have reviewed the data as listed    Latest Ref Rng & Units 03/20/2024   12:59 PM 02/07/2024    7:52 AM 12/27/2023   10:20 AM  CMP  Glucose 70 - 99 mg/dL 98  91  93   BUN 6 - 20 mg/dL 14  15  10    Creatinine 0.44 - 1.00 mg/dL 9.19  9.10  9.18   Sodium 135 - 145 mmol/L 140  140  138   Potassium 3.5 - 5.1 mmol/L 3.8  3.8  4.2   Chloride 98 - 111 mmol/L 107  108  107   CO2 22 - 32 mmol/L 28  24  26    Calcium 8.9 - 10.3 mg/dL 9.1  9.0  8.7   Total Protein 6.5 - 8.1 g/dL 6.6  6.5  6.5   Total Bilirubin 0.0 - 1.2 mg/dL 0.7  0.6  0.5   Alkaline Phos 38 - 126 U/L 103  117  118   AST 15 - 41 U/L 27  23  31    ALT 0 - 44 U/L 23  21  29      Lab Results  Component Value Date   WBC 6.2 03/20/2024   HGB 11.5 (L) 03/20/2024   HCT 33.9 (L) 03/20/2024   MCV 99.4 03/20/2024   PLT 156 03/20/2024   NEUTROABS 4.0 03/20/2024    ASSESSMENT & PLAN:  Malignant neoplasm of central portion of right breast in female, estrogen receptor negative (HCC) 06/18/2021:Right lumpectomy: 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  Adjuvant chemotherapy with Taxol  and Herceptin  followed by Herceptin  maintenance completed 07/06/2022 2. breast radiation  completed 01/04/2022 3.  Metastatic disease diagnosis:CT chest on 11/01/2022: Right upper lobe nodules largest measuring 5 mm, 01/13/23: Liver Biopsy: Met breast cancer ER 0%, PR 0%, Ki67 50%, HER2 3+  4.  Enhertu  02/07/2023-09/13/2023 --------------------------------------------------------------------------------------------------------------------- PET/CT 09/30/2023: Progression of liver metastasis (3.4 cm was 1.7 cm, 2.3 cm was 1.2 cm, additional liver lesions 1.2 cm and 1.3 cm), lung nodule 0.9 cm was 0.6 cm  Current treatment: Herceptin , Tucatinib , capecitabine  started December 2024 Chemo toxicities: Slight rash on the hands left send chest: Reduce the dosage of Xeloda  to 1000 mg in the morning and 500 mg in the evening.:  Continues to moisturize and keep the hands and feet hydrated     ECHO 11/25/2023: EF 50-55% CT CAP 01/15/2024: Liver lesions diminished in size (1.6 cm was 3.4 cm, 1.6 cm was 2.3 cm), diminished size of the right lower lobe lung nodule 0.6 cm was 0.9 cm CT CAP 04/04/2024: Stable hepatic metastases Echo: June 2025: EF 55% normal    echocardiograms will be done every 6 months from this point onwards. RTC in 3 weeks for   f/u, and treatment.  I ordered CBC and CMP today.  We do not have to wait for the labs to come back to give her treatment with Herceptin  today. Plans to obtain scans every 4 months which will be in October 2025. ------------------------------------- Assessment and Plan Assessment & Plan Malignant neoplasm of central portion of right breast Undergoing Herceptin  treatment. Port in place for administration. - Order blood work to monitor treatment progress. - Ensure Herceptin  administration proceeds without delay. - Confirm scan order for October.  Chemotherapy-related fatigue Fatigue fluctuates with chemotherapy, improving on some days but accumulates over time.  Chemotherapy-induced diarrhea Lower gastrointestinal symptoms have stabilized but persist,  less severe than before.      No orders of the defined types were placed in this encounter.  The patient has a good understanding of the overall plan. she agrees with it. she will call with any problems that may develop before the next visit here. Total time spent: 30 mins including face to face time and time spent for planning, charting and co-ordination of care   Naomi MARLA Chad, MD 05/22/24

## 2024-05-23 LAB — CANCER ANTIGEN 27.29: CA 27.29: <9 U/mL (ref 0.0–38.6)

## 2024-05-28 ENCOUNTER — Other Ambulatory Visit: Payer: Self-pay

## 2024-05-28 ENCOUNTER — Telehealth: Payer: Self-pay

## 2024-05-28 ENCOUNTER — Encounter: Payer: Self-pay | Admitting: Hematology and Oncology

## 2024-05-28 DIAGNOSIS — C787 Secondary malignant neoplasm of liver and intrahepatic bile duct: Secondary | ICD-10-CM

## 2024-05-28 DIAGNOSIS — C50111 Malignant neoplasm of central portion of right female breast: Secondary | ICD-10-CM

## 2024-05-28 DIAGNOSIS — D420 Neoplasm of uncertain behavior of cerebral meninges: Secondary | ICD-10-CM

## 2024-05-28 DIAGNOSIS — Z171 Estrogen receptor negative status [ER-]: Secondary | ICD-10-CM

## 2024-05-28 NOTE — Telephone Encounter (Signed)
 Pt scheduled for stat Mri 8/12 and MD f/u 8/14.

## 2024-05-28 NOTE — Telephone Encounter (Signed)
 Called pt to get more information per MyChart message. She reports that she has been taking 1000 mg tylenol  daily with no relief. She is going to try taking benadryl  and ibuprofen . Per MD, order placed for urgent MRI brain w wo contrast to eval for mets since she has a hx of meningioma. She is agreeable to this and knows we will call her once we have her MRI scheduled, to also schedule MD f/u.

## 2024-05-29 ENCOUNTER — Ambulatory Visit (HOSPITAL_COMMUNITY)
Admission: RE | Admit: 2024-05-29 | Discharge: 2024-05-29 | Disposition: A | Discharge status: Home / Self Care | Source: Ambulatory Visit | Attending: Hematology and Oncology | Admitting: Hematology and Oncology

## 2024-05-29 DIAGNOSIS — C787 Secondary malignant neoplasm of liver and intrahepatic bile duct: Secondary | ICD-10-CM | POA: Diagnosis not present

## 2024-05-29 DIAGNOSIS — D420 Neoplasm of uncertain behavior of cerebral meninges: Secondary | ICD-10-CM | POA: Insufficient documentation

## 2024-05-29 DIAGNOSIS — C799 Secondary malignant neoplasm of unspecified site: Secondary | ICD-10-CM | POA: Diagnosis not present

## 2024-05-29 DIAGNOSIS — C50111 Malignant neoplasm of central portion of right female breast: Secondary | ICD-10-CM | POA: Diagnosis not present

## 2024-05-29 DIAGNOSIS — Z171 Estrogen receptor negative status [ER-]: Secondary | ICD-10-CM | POA: Insufficient documentation

## 2024-05-29 DIAGNOSIS — D320 Benign neoplasm of cerebral meninges: Secondary | ICD-10-CM | POA: Diagnosis not present

## 2024-05-29 MED ORDER — HEPARIN SOD (PORK) LOCK FLUSH 100 UNIT/ML IV SOLN
500.0000 [IU] | Freq: Once | INTRAVENOUS | Status: AC
  Administered 2024-05-29 (×2): 500 [IU] via INTRAVENOUS

## 2024-05-29 MED ORDER — GADOBUTROL 1 MMOL/ML IV SOLN
6.0000 mL | Freq: Once | INTRAVENOUS | Status: AC | PRN
  Administered 2024-05-29 (×2): 6 mL via INTRAVENOUS

## 2024-05-30 ENCOUNTER — Ambulatory Visit: Payer: Self-pay

## 2024-05-30 NOTE — Assessment & Plan Note (Signed)
 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  Adjuvant chemotherapy with Taxol  and Herceptin  followed by Herceptin  maintenance completed 07/06/2022 2. breast radiation completed 01/04/2022 3.  Metastatic disease diagnosis:CT chest on 11/01/2022: Right upper lobe nodules largest measuring 5 mm, 01/13/23: Liver Biopsy: Met breast cancer ER 0%, PR 0%, Ki67 50%, HER2 3+  4.  Enhertu  02/07/2023-09/13/2023 --------------------------------------------------------------------------------------------------------------------- PET/CT 09/30/2023: Progression of liver metastasis (3.4 cm was 1.7 cm, 2.3 cm was 1.2 cm, additional liver lesions 1.2 cm and 1.3 cm), lung nodule 0.9 cm was 0.6 cm  Current treatment: Herceptin , Tucatinib , capecitabine  started December 2024 Chemo toxicities: Slight rash on the hands left send chest: Reduce the dosage of Xeloda  to 1000 mg in the morning and 500 mg in the evening.:  Continues to moisturize and keep the hands and feet hydrated     ECHO 11/25/2023: EF 50-55% CT CAP 01/15/2024: Liver lesions diminished in size (1.6 cm was 3.4 cm, 1.6 cm was 2.3 cm), diminished size of the right lower lobe lung nodule 0.6 cm was 0.9 cm CT CAP 04/04/2024: Stable hepatic metastases Echo: June 2025: EF 55% normal  MRI Brain 05/29/24: No evidence of recurrent tumor in Rt post temporal lobe   echocardiograms will be done every 6 months from this point onwards. RTC in 3 weeks for   f/u, and treatment.   Plans to obtain scans every 4 months which will be in October 2025.

## 2024-05-30 NOTE — Telephone Encounter (Signed)
 Patient scheduled with Joy tomorrow.

## 2024-05-30 NOTE — Telephone Encounter (Signed)
 FYI Only or Action Required?: FYI only for provider.  Patient was last seen in primary care on 11/10/2023 by Alvia Bring, DO.  Called Nurse Triage reporting Triage.  Symptoms began a week ago.  Interventions attempted: Rest, hydration, or home remedies.  Symptoms are: unchanged.  Triage Disposition: See Physician Within 24 Hours (overriding Home Care)  Patient/caregiver understands and will follow disposition?:  Reason for Disposition  [1] Sinus congestion as part of a cold AND [2] present < 10 days  Answer Assessment - Initial Assessment Questions Patient already contacted oncologist who ordered MRI (results in Epic). Scheduled for next day in office appointment. ED precautions reviewed, pt verbalized understanding.    LOCATION: Where does it hurt?      Headache, chills, fatigue, nausea and dizziness  ONSET: When did the sinus pain start?  (e.g., hours, days)      One week  NASAL CONGESTION: Is the nose blocked? If Yes, ask: Can you open it or must you breathe through your mouth?     Yes  FEVER: Do you have a fever? If Yes, ask: What is it, how was it measured, and when did it start?      Has not checked, but has had chills  OTHER SYMPTOMS: Do you have any other symptoms? (e.g., sore throat, cough, earache, difficulty breathing)     Sinus pain and congestion, fatigue, nausea. Denies other symptoms including vomiting or shortness of breath.  Protocols used: Sinus Pain or Congestion-A-AH Copied from CRM #[id number]. Topic: Clinical - Red Word Triage >> May 30, 2024  2:30 PM Mercer PEDLAR wrote: Red Word that prompted transfer to Nurse Triage: Patient has been sick for a week, headaches, chills, fatigue, nausea, and dizziness.

## 2024-05-31 ENCOUNTER — Other Ambulatory Visit: Payer: Self-pay

## 2024-05-31 ENCOUNTER — Inpatient Hospital Stay (HOSPITAL_BASED_OUTPATIENT_CLINIC_OR_DEPARTMENT_OTHER): Admitting: Hematology and Oncology

## 2024-05-31 ENCOUNTER — Encounter: Payer: Self-pay | Admitting: Medical-Surgical

## 2024-05-31 ENCOUNTER — Ambulatory Visit (INDEPENDENT_AMBULATORY_CARE_PROVIDER_SITE_OTHER): Admitting: Medical-Surgical

## 2024-05-31 VITALS — BP 109/67 | HR 71 | Temp 98.0°F | Resp 20 | Ht 64.0 in | Wt 131.0 lb

## 2024-05-31 DIAGNOSIS — C50111 Malignant neoplasm of central portion of right female breast: Secondary | ICD-10-CM | POA: Diagnosis not present

## 2024-05-31 DIAGNOSIS — Z171 Estrogen receptor negative status [ER-]: Secondary | ICD-10-CM

## 2024-05-31 DIAGNOSIS — H6502 Acute serous otitis media, left ear: Secondary | ICD-10-CM

## 2024-05-31 MED ORDER — DIAZEPAM 2 MG PO TABS: 2.0000 mg | ORAL_TABLET | Freq: Three times a day (TID) | ORAL | 0 refills | Status: DC | PRN

## 2024-05-31 MED ORDER — AZITHROMYCIN 250 MG PO TABS: ORAL_TABLET | ORAL | 0 refills | Status: AC

## 2024-05-31 MED ORDER — PREDNISONE 20 MG PO TABS: 40.0000 mg | ORAL_TABLET | Freq: Every day | ORAL | 0 refills | Status: DC

## 2024-05-31 NOTE — Progress Notes (Signed)
 HEMATOLOGY-ONCOLOGY TELEPHONE VISIT PROGRESS NOTE  I connected with our patient on 05/31/24 at  2:15 PM EDT by telephone and verified that I am speaking with the correct person using two identifiers.  I discussed the limitations, risks, security and privacy concerns of performing an evaluation and management service by telephone and the availability of in person appointments.  I also discussed with the patient that there may be a patient responsible charge related to this service. The patient expressed understanding and agreed to proceed.   History of Present Illness: Follow-up of yesterday's brain MRI  History of Present Illness Lori Mcmillan is a 63 year old female who presents with an eight-day history of headache.  She experiences significant pressure on the right side of her head, with a sensation as though her skull might split open. Tylenol  has not provided relief, and she plans to try Excedrin Migraine. A recent MRI showed no abnormalities in the brain. Her primary care provider noted redness in her left ear and fluid in her right ear, prescribing antibiotics and prednisone , though she is hesitant to take these medications unless necessary. She notes that changes in barometric pressure can sometimes lead to fluid retention and headaches, although typically not lasting as long as her current symptoms.    Oncology History  Malignant neoplasm of central portion of right breast in female, estrogen receptor negative (HCC)  01/04/2012 Initial Diagnosis   Right breast cancer: Stage Ia ER/PR positive HER2 negative status postlumpectomy, radiation (low risk Oncotype) could not tolerate tamoxifen  for more than 30 days.   01/04/2012 Cancer Staging   Staging form: Breast, AJCC 6th Edition - Pathologic stage from 01/04/2012: Stage I (T1c, N0, M0) - Signed by Crawford Morna Pickle, NP on 10/05/2022 Histologic grade (G): G1   2015 Initial Diagnosis   Surgery of the brain for removal of large  meningioma status post radiation to the brain   05/04/2021 Initial Diagnosis   05/04/2021: Palpable mass in the right breast mammogram revealed 0.8 cm mass and the ultrasound revealed a 1.1 cm mass.  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 No pathogenic variants identified on the [facility] Multi-Cancer Panel+RNA. VUS in POLD1 identified called c.2429C>T. The report date is 07/01/2021.  The Multi-Cancer Panel + RNA offered by [facility] includes sequencing and/or deletion duplication testing of the following 84 genes: AIP, ALK, APC, ATM, AXIN2,BAP1,  BARD1, BLM, BMPR1A, BRCA1, BRCA2, BRIP1, CASR, CDC73, CDH1, CDK4, CDKN1B, CDKN1C, CDKN2A (p14ARF), CDKN2A (p16INK4a), CEBPA, CHEK2, CTNNA1, DICER1, DIS3L2, EGFR (c.2369C>T, p.Thr790Met variant only), EPCAM (Deletion/duplication testing only), FH, FLCN, GATA2, GPC3, GREM1 (Promoter region deletion/duplication testing only), HOXB13 (c.251G>A, p.Gly84Glu), HRAS, KIT, MAX, MEN1, MET, MITF (c.952G>A, p.Glu318Lys variant only), MLH1, MSH2, MSH3, MSH6, MUTYH, NBN, NF1, NF2, NTHL1, PALB2, PDGFRA, PHOX2B, PMS2, POLD1, POLE, POT1, PRKAR1A, PTCH1, PTEN, RAD50, RAD51C, RAD51D, RB1, RECQL4, RET, RUNX1, SDHAF2, SDHA (sequence changes only), SDHB, SDHC, SDHD, SMAD4, SMARCA4, SMARCB1, SMARCE1, STK11, SUFU, TERC, TERT, TMEM127, TP53, TSC1, TSC2, VHL, WRN and WT1.   06/18/2021 Cancer Staging   Staging form: Breast, AJCC 8th Edition - Pathologic stage from 06/18/2021: Stage IA (pT1c, pN0, cM0, G3, ER-, PR-, HER2+) - Signed by Crawford Morna Pickle, NP on 10/05/2022 Stage prefix: Initial diagnosis Histologic grading system:  3 grade system   07/21/2021 - 07/06/2022 Chemotherapy   Patient is on Treatment Plan : BREAST Paclitaxel  + Trastuzumab   q7d / Trastuzumab  q21d     11/25/2021 - 01/04/2022 Radiation Therapy   Site Technique Total Dose (Gy) Dose per Fx (Gy) Completed Fx Beam Energies  Breast, Right: Breast_R 3D 45/45 1.8 25/25 6XFFF  Breast, Right: Breast_R_Bst specialPort 5.4/5.4 1.8 3/3 12E     12/29/2022 Progression   CT chest on 11/01/2022: Right upper lobe nodules largest measuring 5 mm CT chest 12/29/2022: Upper and mid lung zone predominant pulmonary nodules are worrisome for metastatic disease.  Left and right hepatic lobe masses (2.5 cm and 3.9 cm) are new and are worrisome for metastatic disease.   01/13/2023 Initial Biopsy   Right liver biopsy: Metastatic carcinoma with breast primary, ER-, PR-, HER2 +, Ki-67 50%.    01/13/2023 Cancer Staging   Staging form: Breast, AJCC 8th Edition - Pathologic stage from 01/13/2023: Stage IV (pM1, G3, ER-, PR-, HER2+) - Signed by Crawford Morna Pickle, NP on 02/07/2023 Histologic grading system: 3 grade system   01/17/2023 PET scan   PET CT scan 01/17/2023: Hypermetabolic bilobar hepatic (2 masses measuring 2.8 cm SUV 11.1, another mass 4.1 cm SUV 10.3) and pulmonary metastases (right upper lobe lung nodule 5 mm SUV 2.6 right lower lobe 6 mm nodule SUV 1.8)    02/07/2023 - 09/13/2023 Chemotherapy   Patient is on Treatment Plan : BREAST METASTATIC Fam-Trastuzumab Deruxtecan-nxki  (Enhertu ) (5.4) q21d     10/04/2023 -  Chemotherapy   Patient is on Treatment Plan : BREAST Trastuzumab  (8/6) IV D1 + Capecitabine  +  Tucatinib  q21d       REVIEW OF SYSTEMS:   Constitutional: Denies fevers, chills or abnormal weight loss All other systems were reviewed with the patient and are negative. Observations/Objective:     Assessment Plan:  Malignant neoplasm of central portion of right breast in female, estrogen receptor negative (HCC) 06/18/2021:Right lumpectomy: Grade 3 IDC 1.5 cm, high-grade DCIS, margins negative, 0/2 lymph nodes negative, ER 0%, PR 0%, HER2 3+ positive, Ki-67 30%    (2013  Right breast cancer: Stage Ia ER/PR positive HER2 negative status postlumpectomy, radiation (low risk Oncotype) could not tolerate tamoxifen  for more than 30 days.)   Treatment plan: 1.  Adjuvant chemotherapy with Taxol  and Herceptin  followed by Herceptin  maintenance completed 07/06/2022 2. breast radiation completed 01/04/2022 3.  Metastatic disease diagnosis:CT chest on 11/01/2022: Right upper lobe nodules largest measuring 5 mm, 01/13/23: Liver Biopsy: Met breast cancer ER 0%, PR 0%, Ki67 50%, HER2 3+  4.  Enhertu  02/07/2023-09/13/2023 --------------------------------------------------------------------------------------------------------------------- PET/CT 09/30/2023: Progression of liver metastasis (3.4 cm was 1.7 cm, 2.3 cm was 1.2 cm, additional liver lesions 1.2 cm and 1.3 cm), lung nodule 0.9 cm was 0.6 cm  Current treatment: Herceptin , Tucatinib , capecitabine  started December 2024 Chemo toxicities: Slight rash on the hands left send chest: Reduce the dosage of Xeloda  to 1000 mg in the morning and 500 mg in the evening.:  Continues to moisturize and keep the hands and feet hydrated     ECHO 11/25/2023: EF 50-55% CT CAP 01/15/2024: Liver lesions diminished in size (1.6 cm was 3.4 cm, 1.6 cm was 2.3 cm), diminished size of the right lower lobe lung nodule 0.6 cm was 0.9 cm CT CAP 04/04/2024: Stable hepatic metastases Echo: June 2025: EF 55% normal   MRI Brain 05/29/24: No evidence of recurrent tumor in Rt post temporal lobe Recurrent sharp headaches: Could be migraines.  I encouraged  her to take Excedrin migraine headache medication.   echocardiograms will be done every 6 months from this point onwards.     Plans to obtain scans every 4 months which will be in October 2025. --------------------------------- Assessment and Plan Assessment & Plan Headache, unspecified Headache for eight days with right-sided pressure. MRI negative for cancer or concerning findings. Likely tension headache,  differential includes migraine. Possible ear-related etiology noted by primary care. - Advised Excedrin Migraine to assess effectiveness. - Consider antibiotics if headache persists despite Excedrin, indicating possible ear-related etiology.      I discussed the assessment and treatment plan with the patient. The patient was provided an opportunity to ask questions and all were answered. The patient agreed with the plan and demonstrated an understanding of the instructions. The patient was advised to call back or seek an in-person evaluation if the symptoms worsen or if the condition fails to improve as anticipated.   I provided 20 minutes of non-face-to-face time during this encounter.  This includes time for charting and coordination of care   Naomi MARLA Chad, MD

## 2024-05-31 NOTE — Progress Notes (Signed)
        Established patient visit   History of Present Illness   Discussed the use of AI scribe software for clinical note transcription with the patient, who gave verbal consent to proceed.  History of Present Illness   Lori Mcmillan is a 63 year old female with a history of brain surgery who presents with persistent headaches and dizziness.  She has experienced persistent headaches and dizziness for eight days. The headache is a squeezing pressure bilaterally in the temples with tenderness at the previous brain surgery site. It worsened after an infusion and is unrelieved by Tylenol . On Monday night, the headache was severe, feeling like her skull might split. Dizziness and vertigo occurred on Sunday, and she lacks diazepam , previously used for relief. Over-the-counter medications, including severe Tylenol  sinus, have been ineffective. She does not use Advil . There is no congestion, but she experienced left ear ringing one night. No chest pain, palpitations, or shortness of breath. She notes fluid buildup in her ears, with left ear fullness. No eye drainage or significant vision changes, attributing any issues to lack of sleep. She sleeps elevated to reduce brain pressure.     Physical Exam   Physical Exam Vitals reviewed.  Constitutional:      General: She is not in acute distress.    Appearance: Normal appearance. She is not ill-appearing.  HENT:     Head: Normocephalic and atraumatic.     Ears:     Comments: Left ear canal and TM erythematous. Bilateral MEE.  Cardiovascular:     Rate and Rhythm: Normal rate and regular rhythm.     Pulses: Normal pulses.     Heart sounds: Normal heart sounds. No murmur heard.    No friction rub. No gallop.  Pulmonary:     Effort: Pulmonary effort is normal. No respiratory distress.     Breath sounds: Normal breath sounds. No wheezing.  Skin:    General: Skin is warm and dry.  Neurological:     Mental Status: She is alert and oriented to  person, place, and time.  Psychiatric:        Mood and Affect: Mood normal.        Behavior: Behavior normal.        Thought Content: Thought content normal.        Judgment: Judgment normal.     Assessment & Plan   Assessment and Plan    Acute headache with dizziness and vertigo Headache worsening over eight days with dizziness and vertigo relieved by diazepam . Tylenol  ineffective. - Prescribed diazepam  2 mg every 8 hours as needed for dizziness. - Discussed not taking diazepam  and Xanax  together.  Left otitis media with effusion  Possible left otitis media with effusion due to redness and fluid behind the left ear. Differential includes respiratory sinus infection. - Prescribed azithromycin  Z-Pak, 5-day course. - Prescribed 5-day course of steroids to reduce pressure.      Follow up   Return if symptoms worsen or fail to improve.  __________________________________ Zada FREDRIK Palin, DNP, APRN, FNP-BC Primary Care and Sports Medicine [facility]

## 2024-06-04 ENCOUNTER — Other Ambulatory Visit: Payer: Self-pay

## 2024-06-04 ENCOUNTER — Other Ambulatory Visit: Payer: Self-pay | Admitting: Hematology and Oncology

## 2024-06-04 DIAGNOSIS — C50111 Malignant neoplasm of central portion of right female breast: Secondary | ICD-10-CM

## 2024-06-05 ENCOUNTER — Other Ambulatory Visit: Payer: Self-pay

## 2024-06-05 MED ORDER — TUCATINIB 150 MG PO TABS
300.0000 mg | ORAL_TABLET | Freq: Two times a day (BID) | ORAL | 3 refills | Status: DC
  Filled 2024-06-05 – 2024-06-06 (×3): qty 120, 30d supply, fill #0
  Filled 2024-07-16: qty 120, 30d supply, fill #1
  Filled 2024-08-06: qty 120, 30d supply, fill #2
  Filled 2024-09-06: qty 120, 30d supply, fill #3

## 2024-06-06 ENCOUNTER — Other Ambulatory Visit: Payer: Self-pay

## 2024-06-06 ENCOUNTER — Other Ambulatory Visit (HOSPITAL_COMMUNITY): Payer: Self-pay

## 2024-06-06 NOTE — Progress Notes (Signed)
 Clinical Intervention Note  Clinical Intervention Notes: Patient mentioned severe, continuous headache for 2 weeks with no relief from over the counter remedies. She states that this headache is occurring in the same location as previous brain resection. She was prescribed antibiotic and prednisone  by family doctor but does not feel that she has an infection so she does not wish to take antibiotic. She asked if prednisone  can interact with her current regimen, no DDIs identified with capecitabine  or tukysa . She has not started prednisone  yet and is not sure that she will either. She has informed Dr. Gudena and will be reaching out to Dr. Buckley as he was involved in her care for previous meningioma.   Clinical Intervention Outcomes: Prevention of an adverse drug event   Lori Mcmillan Specialty Pharmacist

## 2024-06-06 NOTE — Progress Notes (Signed)
 Specialty Pharmacy Refill Coordination Note  Lori Mcmillan is a 63 y.o. female contacted today regarding refills of specialty medication(s) Tucatinib  (TUKYSA ); Capecitabine  (XELODA )   Patient requested Pickup at [facility] at [place] date: 06/15/24   Medication will be filled on 06/14/24.

## 2024-06-12 ENCOUNTER — Ambulatory Visit: Admitting: Hematology and Oncology

## 2024-06-12 ENCOUNTER — Inpatient Hospital Stay (HOSPITAL_BASED_OUTPATIENT_CLINIC_OR_DEPARTMENT_OTHER): Admitting: Hematology and Oncology

## 2024-06-12 ENCOUNTER — Ambulatory Visit

## 2024-06-12 ENCOUNTER — Inpatient Hospital Stay

## 2024-06-12 VITALS — BP 104/67 | HR 84 | Temp 98.0°F | Resp 17 | Wt 132.2 lb

## 2024-06-12 DIAGNOSIS — Z1731 Human epidermal growth factor receptor 2 positive status: Secondary | ICD-10-CM | POA: Diagnosis not present

## 2024-06-12 DIAGNOSIS — C7800 Secondary malignant neoplasm of unspecified lung: Secondary | ICD-10-CM | POA: Diagnosis not present

## 2024-06-12 DIAGNOSIS — Z5112 Encounter for antineoplastic immunotherapy: Secondary | ICD-10-CM | POA: Diagnosis not present

## 2024-06-12 DIAGNOSIS — Z171 Estrogen receptor negative status [ER-]: Secondary | ICD-10-CM | POA: Diagnosis not present

## 2024-06-12 DIAGNOSIS — C787 Secondary malignant neoplasm of liver and intrahepatic bile duct: Secondary | ICD-10-CM | POA: Diagnosis not present

## 2024-06-12 DIAGNOSIS — Z1722 Progesterone receptor negative status: Secondary | ICD-10-CM | POA: Diagnosis not present

## 2024-06-12 DIAGNOSIS — C50111 Malignant neoplasm of central portion of right female breast: Secondary | ICD-10-CM | POA: Diagnosis not present

## 2024-06-12 DIAGNOSIS — Z923 Personal history of irradiation: Secondary | ICD-10-CM | POA: Diagnosis not present

## 2024-06-12 MED ORDER — SODIUM CHLORIDE 0.9 % IV SOLN: INTRAVENOUS | Status: DC

## 2024-06-12 MED ORDER — DIPHENHYDRAMINE HCL 25 MG PO CAPS
25.0000 mg | ORAL_CAPSULE | Freq: Once | ORAL | Status: AC
  Administered 2024-06-12: 25 mg via ORAL
  Filled 2024-06-12: qty 1

## 2024-06-12 MED ORDER — ACETAMINOPHEN 325 MG PO TABS
650.0000 mg | ORAL_TABLET | Freq: Once | ORAL | Status: AC
  Administered 2024-06-12: 650 mg via ORAL
  Filled 2024-06-12: qty 2

## 2024-06-12 MED ORDER — TRASTUZUMAB CHEMO 150 MG IV SOLR
6.0000 mg/kg | Freq: Once | INTRAVENOUS | Status: AC
  Administered 2024-06-12: 378 mg via INTRAVENOUS
  Filled 2024-06-12: qty 18

## 2024-06-12 NOTE — Progress Notes (Signed)
 Patient Care Team: Alvia Bring, DO as PCP - General (Family Medicine) Izell Domino, MD as Consulting Physician (Radiation Oncology) Buckley, Arthea POUR, MD as Consulting Physician (Oncology) [facility] [place] (Ophthalmology) Terri Alan PARAS, MD as Referring Physician (Otolaryngology) Odean Potts, MD as Medical Oncologist (Hematology and Oncology) Vanderbilt Ned, MD as Consulting Physician (General Surgery) Georgeff, Christopher S, PA-C (Dermatology)  DIAGNOSIS:  Encounter Diagnosis  Name Primary?   Malignant neoplasm of central portion of right breast in female, estrogen receptor negative (HCC) Yes    SUMMARY OF ONCOLOGIC HISTORY: Oncology History  Malignant neoplasm of central portion of right breast in female, estrogen receptor negative (HCC)  01/04/2012 Initial Diagnosis   Right breast cancer: Stage Ia ER/PR positive HER2 negative status postlumpectomy, radiation (low risk Oncotype) could not tolerate tamoxifen  for more than 30 days.   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 No pathogenic variants identified on the [facility] Multi-Cancer Panel+RNA. VUS in POLD1 identified called c.2429C>T. The report date is 07/01/2021.  The Multi-Cancer Panel + RNA  offered by [facility] includes sequencing and/or deletion duplication testing of the following 84 genes: AIP, ALK, APC, ATM, AXIN2,BAP1,  BARD1, BLM, BMPR1A, BRCA1, BRCA2, BRIP1, CASR, CDC73, CDH1, CDK4, CDKN1B, CDKN1C, CDKN2A (p14ARF), CDKN2A (p16INK4a), CEBPA, CHEK2, CTNNA1, DICER1, DIS3L2, EGFR (c.2369C>T, p.Thr790Met variant only), EPCAM (Deletion/duplication testing only), FH, FLCN, GATA2, GPC3, GREM1 (Promoter region deletion/duplication testing only), HOXB13 (c.251G>A, p.Gly84Glu), HRAS, KIT, MAX, MEN1, MET, MITF (c.952G>A, p.Glu318Lys variant only), MLH1, MSH2, MSH3, MSH6, MUTYH, NBN, NF1, NF2, NTHL1, PALB2, PDGFRA, PHOX2B, PMS2, POLD1, POLE, POT1, PRKAR1A, PTCH1, PTEN, RAD50, RAD51C, RAD51D, RB1, RECQL4, RET, RUNX1, SDHAF2, SDHA (sequence changes only), SDHB, SDHC, SDHD, SMAD4, SMARCA4, SMARCB1, SMARCE1, STK11, SUFU, TERC, TERT, TMEM127, TP53, TSC1, TSC2, VHL, WRN and WT1.   06/18/2021 Cancer Staging   Staging form: Breast, AJCC 8th Edition - Pathologic stage from 06/18/2021: Stage IA (pT1c, pN0, cM0, G3, ER-, PR-, HER2+) - Signed by Crawford Morna Pickle, NP on 10/05/2022 Stage prefix: Initial diagnosis Histologic grading system: 3 grade system   07/21/2021 - 07/06/2022 Chemotherapy   Patient is on Treatment Plan : BREAST Paclitaxel  + Trastuzumab  q7d / Trastuzumab  q21d     11/25/2021 - 01/04/2022 Radiation Therapy   Site Technique Total Dose (Gy) Dose per Fx (Gy) Completed Fx Beam Energies  Breast, Right: Breast_R 3D 45/45 1.8 25/25 6XFFF  Breast, Right: Breast_R_Bst specialPort 5.4/5.4 1.8 3/3 12E     12/29/2022 Progression   CT chest on 11/01/2022: Right upper lobe nodules largest measuring 5 mm CT chest 12/29/2022: Upper and mid lung zone predominant pulmonary nodules are worrisome for metastatic disease.  Left and right hepatic lobe masses (2.5 cm and 3.9 cm) are new and are worrisome for metastatic disease.  01/13/2023 Initial Biopsy   Right liver biopsy: Metastatic carcinoma with breast  primary, ER-, PR-, HER2 +, Ki-67 50%.    01/13/2023 Cancer Staging   Staging form: Breast, AJCC 8th Edition - Pathologic stage from 01/13/2023: Stage IV (pM1, G3, ER-, PR-, HER2+) - Signed by Crawford Morna Pickle, NP on 02/07/2023 Histologic grading system: 3 grade system   01/17/2023 PET scan   PET CT scan 01/17/2023: Hypermetabolic bilobar hepatic (2 masses measuring 2.8 cm SUV 11.1, another mass 4.1 cm SUV 10.3) and pulmonary metastases (right upper lobe lung nodule 5 mm SUV 2.6 right lower lobe 6 mm nodule SUV 1.8)    02/07/2023 - 09/13/2023 Chemotherapy   Patient is on Treatment Plan : BREAST METASTATIC Fam-Trastuzumab Deruxtecan-nxki  (Enhertu ) (5.4) q21d     10/04/2023 -  Chemotherapy   Patient is on Treatment Plan : BREAST Trastuzumab  (8/6) IV D1 + Capecitabine  +  Tucatinib  q21d       CHIEF COMPLIANT: Follow-up on Xeloda  Herceptin  and Tucatinib   HISTORY OF PRESENT ILLNESS:   History of Present Illness Lori Mcmillan is a 63 year old female with a history of brain resection who presents with persistent headaches.  She experiences persistent headaches lasting twelve consecutive days, tender to the touch, and possibly triggered by high pollen levels, including ragweed, chinopods, and grass. She manages the headaches with Tylenol  and Benadryl , which have provided some relief. Recently, Tylenol  Severe Sinus Headache and Claritin alleviated her symptoms after a couple of hours. She has a history of brain resection, which she believes may contribute to her headaches due to a cavity where pressure builds up. An MRI in the past ruled out other causes of her symptoms.     ALLERGIES:  has no known allergies.  MEDICATIONS:  Current Outpatient Medications  Medication Sig Dispense Refill   acetaminophen  (TYLENOL ) 500 MG tablet Take 500 mg by mouth as needed.     capecitabine  (XELODA ) 500 MG tablet Take 1 tablets (500 mg) in AM and 1 tablets (500 mg) in PM by mouth every 12 hours. Take for  14 days on, 7 days off. Repeat every 21 days. 42 tablet 3   diazepam  (VALIUM ) 2 MG tablet Take 1 tablet (2 mg total) by mouth every 8 (eight) hours as needed for anxiety. 20 tablet 0   Famotidine -Ca Carb-Mag Hydrox (PEPCID  COMPLETE PO) Take by mouth as needed.     heparin  10,000 Units in sodium chloride  0.9 % 18 mL by CRRT route continuous. Every 21 days     lidocaine -prilocaine  (EMLA ) cream Apply to affected area once 30 g 3   predniSONE  (DELTASONE ) 20 MG tablet Take 2 tablets (40 mg total) by mouth daily with breakfast. 10 tablet 0   tucatinib  (TUKYSA ) 150 MG tablet Take 2 tablets (300 mg total) by mouth 2 (two) times daily. Take every 12 hrs at the same time each day with or without a meal. 120 tablet 3   No current facility-administered medications for this visit.    PHYSICAL EXAMINATION: ECOG PERFORMANCE STATUS: 1 - Symptomatic but completely ambulatory  Vitals:   06/12/24 0859  BP: 104/67  Pulse: 84  Resp: 17  Temp: 98 F (36.7 C)  SpO2: 98%   Filed Weights   06/12/24 0859  Weight: 132 lb 3.2 oz (60 kg)      LABORATORY DATA:  I have reviewed the data as listed    Latest Ref Rng & Units 05/22/2024   11:19 AM 03/20/2024   12:59 PM 02/07/2024  7:52 AM  CMP  Glucose 70 - 99 mg/dL 98  98  91   BUN 8 - 23 mg/dL 12  14  15    Creatinine 0.44 - 1.00 mg/dL 9.27  9.19  9.10   Sodium 135 - 145 mmol/L 137  140  140   Potassium 3.5 - 5.1 mmol/L 3.9  3.8  3.8   Chloride 98 - 111 mmol/L 106  107  108   CO2 22 - 32 mmol/L 26  28  24    Calcium 8.9 - 10.3 mg/dL 9.0  9.1  9.0   Total Protein 6.5 - 8.1 g/dL 6.6  6.6  6.5   Total Bilirubin 0.0 - 1.2 mg/dL 0.5  0.7  0.6   Alkaline Phos 38 - 126 U/L 115  103  117   AST 15 - 41 U/L 33  27  23   ALT 0 - 44 U/L 33  23  21     Lab Results  Component Value Date   WBC 7.2 05/22/2024   HGB 11.8 (L) 05/22/2024   HCT 34.1 (L) 05/22/2024   MCV 98.3 05/22/2024   PLT 202 05/22/2024   NEUTROABS 4.3 05/22/2024    ASSESSMENT & PLAN:   Malignant neoplasm of central portion of right breast in female, estrogen receptor negative (HCC) 06/18/2021:Right lumpectomy: 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  Adjuvant chemotherapy with Taxol  and Herceptin  followed by Herceptin  maintenance completed 07/06/2022 2. breast radiation completed 01/04/2022 3.  Metastatic disease diagnosis:CT chest on 11/01/2022: Right upper lobe nodules largest measuring 5 mm, 01/13/23: Liver Biopsy: Met breast cancer ER 0%, PR 0%, Ki67 50%, HER2 3+  4.  Enhertu  02/07/2023-09/13/2023 --------------------------------------------------------------------------------------------------------------------- PET/CT 09/30/2023: Progression of liver metastasis (3.4 cm was 1.7 cm, 2.3 cm was 1.2 cm, additional liver lesions 1.2 cm and 1.3 cm), lung nodule 0.9 cm was 0.6 cm  Current treatment: Herceptin , Tucatinib , capecitabine  started December 2024 Chemo toxicities: Slight rash on the hands left send chest: Reduce the dosage of Xeloda  to 1000 mg in the morning and 500 mg in the evening.:  Continues to moisturize and keep the hands and feet hydrated     ECHO 11/25/2023: EF 50-55% CT CAP 01/15/2024: Liver lesions diminished in size (1.6 cm was 3.4 cm, 1.6 cm was 2.3 cm), diminished size of the right lower lobe lung nodule 0.6 cm was 0.9 cm CT CAP 04/04/2024: Stable hepatic metastases Echo: June 2025: EF 55% normal    MRI Brain 05/29/24: No evidence of recurrent tumor in Rt post temporal lobe  Recurrent sharp headaches: Could be migraines.  I encouraged her to take Excedrin migraine headache medication.    echocardiograms will be done every 6 months from this point onwards.   Plans to obtain scans which  will be in October 2025.  Assessment & Plan Headache with possible allergic and post-surgical triggers Headache persisted for 12 days, possibly triggered by pollen allergies and post-surgical changes. Relief with Tylenol , Benadryl , Tylenol  severe sinus headache, and Claritin. Pollen suspected as trigger. - Continue Tylenol  and Benadryl  as needed. - Use Claritin for allergy management.  Gastrointestinal symptoms triggered by acidic foods Symptoms likely triggered by high acidic foods, not drug-related. Improvement with avoidance of acidic foods. -  Avoid high acidic foods.      No orders of the defined types were placed in this encounter.  The patient has a good understanding of the overall plan. she agrees with it. she will call with any problems that may develop before the next visit here. Total time spent: 30 mins including face to face time and time spent for planning, charting and co-ordination of care   Naomi MARLA Chad, MD 06/12/24

## 2024-06-12 NOTE — Assessment & Plan Note (Addendum)
 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  Adjuvant chemotherapy with Taxol  and Herceptin  followed by Herceptin  maintenance completed 07/06/2022 2. breast radiation completed 01/04/2022 3.  Metastatic disease diagnosis:CT chest on 11/01/2022: Right upper lobe nodules largest measuring 5 mm, 01/13/23: Liver Biopsy: Met breast cancer ER 0%, PR 0%, Ki67 50%, HER2 3+  4.  Enhertu  02/07/2023-09/13/2023 --------------------------------------------------------------------------------------------------------------------- PET/CT 09/30/2023: Progression of liver metastasis (3.4 cm was 1.7 cm, 2.3 cm was 1.2 cm, additional liver lesions 1.2 cm and 1.3 cm), lung nodule 0.9 cm was 0.6 cm  Current treatment: Herceptin , Tucatinib , capecitabine  started December 2024 Chemo toxicities: Slight rash on the hands left send chest: Reduce the dosage of Xeloda  to 1000 mg in the morning and 500 mg in the evening.:  Continues to moisturize and keep the hands and feet hydrated     ECHO 11/25/2023: EF 50-55% CT CAP 01/15/2024: Liver lesions diminished in size (1.6 cm was 3.4 cm, 1.6 cm was 2.3 cm), diminished size of the right lower lobe lung nodule 0.6 cm was 0.9 cm CT CAP 04/04/2024: Stable hepatic metastases Echo: June 2025: EF 55% normal    MRI Brain 05/29/24: No evidence of recurrent tumor in Rt post temporal lobe  Recurrent sharp headaches: Could be migraines.  I encouraged her to take Excedrin migraine headache medication.    echocardiograms will be done every 6 months from this point onwards.   Plans to obtain scans which will be in October 2025.

## 2024-06-12 NOTE — Patient Instructions (Signed)
 [facility] - A DEPT OF [facility]  Discharge Instructions: Thank you for choosing [facility] to provide your oncology and hematology care.   If you have a lab appointment with the Cancer Center, please go directly to the Cancer Center and check in at the registration area.   Wear comfortable clothing and clothing appropriate for easy access to any Portacath or PICC line.   We strive to give you quality time with your provider. You may need to reschedule your appointment if you arrive late (15 or more minutes).  Arriving late affects you and other patients whose appointments are after yours.  Also, if you miss three or more appointments without notifying the office, you may be dismissed from the clinic at the provider's discretion.      For prescription refill requests, have your pharmacy contact our office and allow 72 hours for refills to be completed.    Today you received the following chemotherapy and/or immunotherapy agents herceptin      To help prevent nausea and vomiting after your treatment, we encourage you to take your nausea medication as directed.  BELOW ARE SYMPTOMS THAT SHOULD BE REPORTED IMMEDIATELY: *FEVER GREATER THAN 100.4 F (38 C) OR HIGHER *CHILLS OR SWEATING *NAUSEA AND VOMITING THAT IS NOT CONTROLLED WITH YOUR NAUSEA MEDICATION *UNUSUAL SHORTNESS OF BREATH *UNUSUAL BRUISING OR BLEEDING *URINARY PROBLEMS (pain or burning when urinating, or frequent urination) *BOWEL PROBLEMS (unusual diarrhea, constipation, pain near the anus) TENDERNESS IN MOUTH AND THROAT WITH OR WITHOUT PRESENCE OF ULCERS (sore throat, sores in mouth, or a toothache) UNUSUAL RASH, SWELLING OR PAIN  UNUSUAL VAGINAL DISCHARGE OR ITCHING   Items with * indicate a potential emergency and should be followed up as soon as possible or go to the Emergency Department if any problems should occur.  Please show the CHEMOTHERAPY ALERT CARD or IMMUNOTHERAPY  ALERT CARD at check-in to the Emergency Department and triage nurse.  Should you have questions after your visit or need to cancel or reschedule your appointment, please contact [facility] - A DEPT OF [facility]  Dept: [phone]  and follow the prompts.  Office hours are 8:00 a.m. to 4:30 p.m. Monday - Friday. Please note that voicemails left after 4:00 p.m. may not be returned until the following business day.  We are closed weekends and major holidays. You have access to a nurse at all times for urgent questions. Please call the main number to the clinic Dept: [phone] and follow the prompts.   For any non-urgent questions, you may also contact your provider using MyChart. We now offer e-Visits for anyone 2 and older to request care online for non-urgent symptoms. For details visit [email or web address].   Also download the MyChart app! Go to the app store, search "MyChart", open the app, select [place], and log in with your MyChart username and password.

## 2024-06-15 ENCOUNTER — Other Ambulatory Visit: Payer: Self-pay

## 2024-06-15 DIAGNOSIS — L700 Acne vulgaris: Secondary | ICD-10-CM | POA: Diagnosis not present

## 2024-06-19 ENCOUNTER — Encounter: Payer: Self-pay | Admitting: Sports Medicine

## 2024-06-29 ENCOUNTER — Other Ambulatory Visit: Payer: Self-pay

## 2024-06-29 NOTE — Progress Notes (Signed)
 Specialty Pharmacy Ongoing Clinical Assessment Note  Lori Mcmillan is a 63 y.o. female who is being followed by the specialty pharmacy service for RxSp Oncology   Patient's specialty medication(s) reviewed today: Capecitabine  (XELODA ); Tucatinib  (TUKYSA )   Missed doses in the last 4 weeks: 0   Patient/Caregiver did not have any additional questions or concerns.   Therapeutic benefit summary: Patient is achieving benefit   Adverse events/side effects summary: Experienced adverse events/side effects (dryness in hands and feet - uses aquaphor)   Patient's therapy is appropriate to: Continue    Goals Addressed             This Visit's Progress    Maintain optimal adherence to therapy   On track    Patient is on track. Patient will maintain adherence         Follow up: 3 months  [facility] Specialty Pharmacist

## 2024-06-29 NOTE — Progress Notes (Signed)
 Specialty Pharmacy Refill Coordination Note  Lori Mcmillan is a 63 y.o. female contacted today regarding refills of specialty medication(s) Capecitabine  (XELODA )   Patient requested [name] at [facility] [place] date: 07/03/24   Medication will be filled on 07/02/24.

## 2024-07-03 ENCOUNTER — Inpatient Hospital Stay: Attending: Hematology and Oncology

## 2024-07-03 VITALS — BP 143/70 | HR 78 | Temp 98.5°F | Resp 18 | Wt 132.0 lb

## 2024-07-03 DIAGNOSIS — C787 Secondary malignant neoplasm of liver and intrahepatic bile duct: Secondary | ICD-10-CM | POA: Diagnosis not present

## 2024-07-03 DIAGNOSIS — C50111 Malignant neoplasm of central portion of right female breast: Secondary | ICD-10-CM | POA: Diagnosis not present

## 2024-07-03 DIAGNOSIS — Z5112 Encounter for antineoplastic immunotherapy: Secondary | ICD-10-CM | POA: Insufficient documentation

## 2024-07-03 MED ORDER — TRASTUZUMAB CHEMO 150 MG IV SOLR
6.0000 mg/kg | Freq: Once | INTRAVENOUS | Status: AC
  Administered 2024-07-03: 378 mg via INTRAVENOUS
  Filled 2024-07-03: qty 18

## 2024-07-03 MED ORDER — DIPHENHYDRAMINE HCL 25 MG PO CAPS
25.0000 mg | ORAL_CAPSULE | Freq: Once | ORAL | Status: AC
  Administered 2024-07-03: 25 mg via ORAL
  Filled 2024-07-03: qty 1

## 2024-07-03 MED ORDER — ACETAMINOPHEN 325 MG PO TABS
650.0000 mg | ORAL_TABLET | Freq: Once | ORAL | Status: AC
  Administered 2024-07-03: 650 mg via ORAL
  Filled 2024-07-03: qty 2

## 2024-07-03 MED ORDER — SODIUM CHLORIDE 0.9 % IV SOLN: INTRAVENOUS | Status: DC

## 2024-07-03 NOTE — Patient Instructions (Signed)
 [facility] - A DEPT OF [facility]  Discharge Instructions: Thank you for choosing [facility] to provide your oncology and hematology care.   If you have a lab appointment with the Cancer Center, please go directly to the Cancer Center and check in at the registration area.   Wear comfortable clothing and clothing appropriate for easy access to any Portacath or PICC line.   We strive to give you quality time with your provider. You may need to reschedule your appointment if you arrive late (15 or more minutes).  Arriving late affects you and other patients whose appointments are after yours.  Also, if you miss three or more appointments without notifying the office, you may be dismissed from the clinic at the provider's discretion.      For prescription refill requests, have your pharmacy contact our office and allow 72 hours for refills to be completed.    Today you received the following chemotherapy and/or immunotherapy agents herceptin      To help prevent nausea and vomiting after your treatment, we encourage you to take your nausea medication as directed.  BELOW ARE SYMPTOMS THAT SHOULD BE REPORTED IMMEDIATELY: *FEVER GREATER THAN 100.4 F (38 C) OR HIGHER *CHILLS OR SWEATING *NAUSEA AND VOMITING THAT IS NOT CONTROLLED WITH YOUR NAUSEA MEDICATION *UNUSUAL SHORTNESS OF BREATH *UNUSUAL BRUISING OR BLEEDING *URINARY PROBLEMS (pain or burning when urinating, or frequent urination) *BOWEL PROBLEMS (unusual diarrhea, constipation, pain near the anus) TENDERNESS IN MOUTH AND THROAT WITH OR WITHOUT PRESENCE OF ULCERS (sore throat, sores in mouth, or a toothache) UNUSUAL RASH, SWELLING OR PAIN  UNUSUAL VAGINAL DISCHARGE OR ITCHING   Items with * indicate a potential emergency and should be followed up as soon as possible or go to the Emergency Department if any problems should occur.  Please show the CHEMOTHERAPY ALERT CARD or IMMUNOTHERAPY  ALERT CARD at check-in to the Emergency Department and triage nurse.  Should you have questions after your visit or need to cancel or reschedule your appointment, please contact [facility] - A DEPT OF [facility]  Dept: [phone]  and follow the prompts.  Office hours are 8:00 a.m. to 4:30 p.m. Monday - Friday. Please note that voicemails left after 4:00 p.m. may not be returned until the following business day.  We are closed weekends and major holidays. You have access to a nurse at all times for urgent questions. Please call the main number to the clinic Dept: [phone] and follow the prompts.   For any non-urgent questions, you may also contact your provider using MyChart. We now offer e-Visits for anyone 2 and older to request care online for non-urgent symptoms. For details visit [email or web address].   Also download the MyChart app! Go to the app store, search "MyChart", open the app, select [place], and log in with your MyChart username and password.

## 2024-07-11 ENCOUNTER — Other Ambulatory Visit: Payer: Self-pay

## 2024-07-12 ENCOUNTER — Other Ambulatory Visit: Payer: Self-pay

## 2024-07-16 ENCOUNTER — Other Ambulatory Visit: Payer: Self-pay

## 2024-07-16 ENCOUNTER — Other Ambulatory Visit (HOSPITAL_COMMUNITY): Payer: Self-pay

## 2024-07-16 ENCOUNTER — Other Ambulatory Visit: Payer: Self-pay | Admitting: Pharmacy Technician

## 2024-07-16 ENCOUNTER — Encounter (INDEPENDENT_AMBULATORY_CARE_PROVIDER_SITE_OTHER): Payer: Self-pay

## 2024-07-16 NOTE — Progress Notes (Addendum)
 Specialty Pharmacy Refill Coordination Note  Lori Mcmillan is a 62 y.o. female contacted today regarding refills of specialty medication(s) Tucatinib  (TUKYSA ); Capecitabine  (XELODA )   Patient requested (Patient-Rptd) Pickup at [facility] date: (Patient-Rptd) 07/23/24   Medication will be filled on 07/20/24.

## 2024-07-19 ENCOUNTER — Other Ambulatory Visit: Payer: Self-pay

## 2024-07-24 ENCOUNTER — Inpatient Hospital Stay

## 2024-07-24 ENCOUNTER — Other Ambulatory Visit (HOSPITAL_COMMUNITY): Payer: Self-pay

## 2024-07-24 ENCOUNTER — Inpatient Hospital Stay: Attending: Hematology and Oncology | Admitting: Hematology and Oncology

## 2024-07-24 VITALS — BP 103/57 | HR 85 | Temp 97.6°F | Resp 16 | Ht 64.0 in | Wt 130.8 lb

## 2024-07-24 DIAGNOSIS — Z170 Estrogen receptor positive status [ER+]: Secondary | ICD-10-CM | POA: Diagnosis not present

## 2024-07-24 DIAGNOSIS — C787 Secondary malignant neoplasm of liver and intrahepatic bile duct: Secondary | ICD-10-CM | POA: Insufficient documentation

## 2024-07-24 DIAGNOSIS — Z5112 Encounter for antineoplastic immunotherapy: Secondary | ICD-10-CM | POA: Insufficient documentation

## 2024-07-24 DIAGNOSIS — Z1721 Progesterone receptor positive status: Secondary | ICD-10-CM | POA: Diagnosis not present

## 2024-07-24 DIAGNOSIS — Z171 Estrogen receptor negative status [ER-]: Secondary | ICD-10-CM | POA: Diagnosis not present

## 2024-07-24 DIAGNOSIS — Z923 Personal history of irradiation: Secondary | ICD-10-CM | POA: Insufficient documentation

## 2024-07-24 DIAGNOSIS — C7800 Secondary malignant neoplasm of unspecified lung: Secondary | ICD-10-CM | POA: Diagnosis not present

## 2024-07-24 DIAGNOSIS — Z1732 Human epidermal growth factor receptor 2 negative status: Secondary | ICD-10-CM | POA: Insufficient documentation

## 2024-07-24 DIAGNOSIS — C50111 Malignant neoplasm of central portion of right female breast: Secondary | ICD-10-CM | POA: Insufficient documentation

## 2024-07-24 LAB — CBC WITH DIFFERENTIAL (CANCER CENTER ONLY)
Abs Immature Granulocytes: 0.01 K/uL (ref 0.00–0.07)
Basophils Absolute: 0 K/uL (ref 0.0–0.1)
Basophils Relative: 0 %
Eosinophils Absolute: 0.1 K/uL (ref 0.0–0.5)
Eosinophils Relative: 2 %
HCT: 35.2 % — ABNORMAL LOW (ref 36.0–46.0)
Hemoglobin: 11.9 g/dL — ABNORMAL LOW (ref 12.0–15.0)
Immature Granulocytes: 0 %
Lymphocytes Relative: 22 %
Lymphs Abs: 1.5 K/uL (ref 0.7–4.0)
MCH: 32.7 pg (ref 26.0–34.0)
MCHC: 33.8 g/dL (ref 30.0–36.0)
MCV: 96.7 fL (ref 80.0–100.0)
Monocytes Absolute: 0.7 K/uL (ref 0.1–1.0)
Monocytes Relative: 11 %
Neutro Abs: 4.5 K/uL (ref 1.7–7.7)
Neutrophils Relative %: 65 %
Platelet Count: 167 K/uL (ref 150–400)
RBC: 3.64 MIL/uL — ABNORMAL LOW (ref 3.87–5.11)
RDW: 13.6 % (ref 11.5–15.5)
WBC Count: 6.9 K/uL (ref 4.0–10.5)
nRBC: 0 % (ref 0.0–0.2)

## 2024-07-24 LAB — CMP (CANCER CENTER ONLY)
ALT: 17 U/L (ref 0–44)
AST: 18 U/L (ref 15–41)
Albumin: 3.9 g/dL (ref 3.5–5.0)
Alkaline Phosphatase: 94 U/L (ref 38–126)
Anion gap: 3 — ABNORMAL LOW (ref 5–15)
BUN: 10 mg/dL (ref 8–23)
CO2: 28 mmol/L (ref 22–32)
Calcium: 9.2 mg/dL (ref 8.9–10.3)
Chloride: 108 mmol/L (ref 98–111)
Creatinine: 0.85 mg/dL (ref 0.44–1.00)
GFR, Estimated: >60 mL/min (ref >60)
Glucose, Bld: 91 mg/dL (ref 70–99)
Potassium: 3.6 mmol/L (ref 3.5–5.1)
Sodium: 139 mmol/L (ref 135–145)
Total Bilirubin: 0.5 mg/dL (ref 0.0–1.2)
Total Protein: 6.5 g/dL (ref 6.5–8.1)

## 2024-07-24 MED ORDER — TRASTUZUMAB CHEMO 150 MG IV SOLR
6.0000 mg/kg | Freq: Once | INTRAVENOUS | Status: AC
  Administered 2024-07-24: 378 mg via INTRAVENOUS
  Filled 2024-07-24: qty 18

## 2024-07-24 MED ORDER — ACETAMINOPHEN 325 MG PO TABS
650.0000 mg | ORAL_TABLET | Freq: Once | ORAL | Status: AC
  Administered 2024-07-24: 650 mg via ORAL
  Filled 2024-07-24: qty 2

## 2024-07-24 MED ORDER — SODIUM CHLORIDE 0.9% FLUSH: 10.0000 mL | INTRAVENOUS | Status: DC | PRN

## 2024-07-24 MED ORDER — SODIUM CHLORIDE 0.9 % IV SOLN: INTRAVENOUS | Status: DC

## 2024-07-24 MED ORDER — DIPHENHYDRAMINE HCL 25 MG PO CAPS
25.0000 mg | ORAL_CAPSULE | Freq: Once | ORAL | Status: AC
  Administered 2024-07-24: 25 mg via ORAL
  Filled 2024-07-24: qty 1

## 2024-07-24 MED ORDER — DIPHENOXYLATE-ATROPINE 2.5-0.025 MG PO TABS
1.0000 | ORAL_TABLET | Freq: Four times a day (QID) | ORAL | 3 refills | Status: AC | PRN
  Filled 2024-07-24: qty 30, 8d supply, fill #0

## 2024-07-24 NOTE — Patient Instructions (Addendum)
 [facility] - A DEPT OF [facility]  Discharge Instructions: Thank you for choosing [facility] to provide your oncology and hematology care.   If you have a lab appointment with the Cancer Center, please go directly to the Cancer Center and check in at the registration area.   Wear comfortable clothing and clothing appropriate for easy access to any Portacath or PICC line.   We strive to give you quality time with your provider. You may need to reschedule your appointment if you arrive late (15 or more minutes).  Arriving late affects you and other patients whose appointments are after yours.  Also, if you miss three or more appointments without notifying the office, you may be dismissed from the clinic at the provider's discretion.      For prescription refill requests, have your pharmacy contact our office and allow 72 hours for refills to be completed.    Today you received the following chemotherapy and/or immunotherapy agents: herceptin       To help prevent nausea and vomiting after your treatment, we encourage you to take your nausea medication as directed.  BELOW ARE SYMPTOMS THAT SHOULD BE REPORTED IMMEDIATELY: *FEVER GREATER THAN 100.4 F (38 C) OR HIGHER *CHILLS OR SWEATING *NAUSEA AND VOMITING THAT IS NOT CONTROLLED WITH YOUR NAUSEA MEDICATION *UNUSUAL SHORTNESS OF BREATH *UNUSUAL BRUISING OR BLEEDING *URINARY PROBLEMS (pain or burning when urinating, or frequent urination) *BOWEL PROBLEMS (unusual diarrhea, constipation, pain near the anus) TENDERNESS IN MOUTH AND THROAT WITH OR WITHOUT PRESENCE OF ULCERS (sore throat, sores in mouth, or a toothache) UNUSUAL RASH, SWELLING OR PAIN  UNUSUAL VAGINAL DISCHARGE OR ITCHING   Items with * indicate a potential emergency and should be followed up as soon as possible or go to the Emergency Department if any problems should occur.  Please show the CHEMOTHERAPY ALERT CARD or IMMUNOTHERAPY  ALERT CARD at check-in to the Emergency Department and triage nurse.  Should you have questions after your visit or need to cancel or reschedule your appointment, please contact [facility] - A DEPT OF [facility]  Dept: [phone]  and follow the prompts.  Office hours are 8:00 a.m. to 4:30 p.m. Monday - Friday. Please note that voicemails left after 4:00 p.m. may not be returned until the following business day.  We are closed weekends and major holidays. You have access to a nurse at all times for urgent questions. Please call the main number to the clinic Dept: [phone] and follow the prompts.   For any non-urgent questions, you may also contact your provider using MyChart. We now offer e-Visits for anyone 45 and older to request care online for non-urgent symptoms. For details visit [email or web address].   Also download the MyChart app! Go to the app store, search MyChart, open the app, select [facility], and log in with your MyChart username and password.  Diphenoxylate; Atropine Tablets What is this medication? DIPHENOXYLATE; ATROPINE (dye fen OX i late A troe peen) treats diarrhea. It works by slowing down an overactive bowel, which decreases the number of bowel movements you have. It belongs to a group of medications called antidiarrheals. This medicine may be used for other purposes; ask your health care provider or pharmacist if you have questions. COMMON BRAND NAME(S): Lomotil, Lonox, Vi-Atro What should I tell my care team before I take this medication? They need to know if you have any of these  conditions: Bacterial food poisoning Colitis Dehydration Down's syndrome Jaundice or liver disease An unusual or allergic reaction to diphenoxylate, atropine, other medications, foods, dyes, or preservatives Pregnant or trying to get pregnant Breast-feeding How should I use this medication? Take this medication by mouth with a glass of water.  Follow the directions on the prescription label. You can take the tablets with food. Take your doses at regular intervals. Do not take your medication more often than directed. Once your diarrhea has been brought under control your care team may reduce your doses. Talk to your care team about the use of this medication in children. Special care may be needed. Elderly patients may be more sensitive to the effects of this medication. Overdosage: If you think you have taken too much of this medicine contact a poison control center or emergency room at once. NOTE: This medicine is only for you. Do not share this medicine with others. What if I miss a dose? If you miss a dose, take it as soon as you can. If it is almost time for your next dose, take only that dose. Do not take double or extra doses. What may interact with this medication? Alcohol Antihistamines for allergy, cough and cold Barbiturate medications for inducing sleep or treating seizures Certain medications for depression, anxiety, or mental health conditions Certain medications for sleep Medications for movement abnormalities, as in Parkinson's disease, or for gastrointestinal problems Muscle relaxants Opioid medications for pain This list may not describe all possible interactions. Give your health care provider a list of all the medicines, herbs, non-prescription drugs, or dietary supplements you use. Also tell them if you smoke, drink alcohol, or use illegal drugs. Some items may interact with your medicine. What should I watch for while using this medication? If your symptoms do not start to get better after taking this medication for two days, check with your care team, you may have a problem that needs further evaluation. Check with your care team right away if you develop a fever or bloody diarrhea. You may get drowsy or dizzy. Do not drive, use machinery, or do anything that needs mental alertness until you know how this  medication affects you. Alcohol can increase possible drowsiness and dizziness. Avoid alcoholic drinks. Your mouth may get dry. Chewing sugarless gum or sucking hard candy, and drinking plenty of water may help. Contact your care team if the problem does not go away or is severe. Drinking plenty of water can also help prevent dehydration that can occur with diarrhea. What side effects may I notice from receiving this medication? Side effects that you should report to your care team as soon as possible: Allergic reactions--skin rash, itching, hives, swelling of the face, lips, tongue, or throat Anticholinergic toxicity--flushed face, blurry vision, dry mouth and skin, confusion, fast or irregular heartbeat, trouble passing urine, constipation CNS depression--slow or shallow breathing, shortness of breath, feeling faint, dizziness, confusion, trouble staying awake Side effects that usually do not require medical attention (report to your care team if they continue or are bothersome): Confusion Drowsiness Dizziness Extreme feeling of happiness or joy, intense excitement Stomach pain This list may not describe all possible side effects. Call your doctor for medical advice about side effects. You may report side effects to FDA at 1-800-FDA-1088. Where should I keep my medication? Keep out of the reach of children and pets. This medication can be abused. Keep your medication in a safe place to protect it from theft. Do not share this  medication with anyone. Selling or giving away this medication is dangerous and against the law. This medication may cause accidental overdose and death if taken by other adults, children, or pets. Mix any unused medication with a substance like cat litter or coffee grounds. Then throw the medication away in a sealed container like a sealed bag or a coffee can with a lid. Do not use the medication after the expiration date. Store at room temperature between 15 and 30 degrees  C (59 and 86 degrees F). Protect from light. Keep container tightly closed. NOTE: This sheet is a summary. It may not cover all possible information. If you have questions about this medicine, talk to your doctor, pharmacist, or health care provider.  2024 Elsevier/Gold Standard (2021-08-11 00:00:00)

## 2024-07-24 NOTE — Assessment & Plan Note (Signed)
 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  Adjuvant chemotherapy with Taxol  and Herceptin  followed by Herceptin  maintenance completed 07/06/2022 2. breast radiation completed 01/04/2022 3.  Metastatic disease diagnosis:CT chest on 11/01/2022: Right upper lobe nodules largest measuring 5 mm, 01/13/23: Liver Biopsy: Met breast cancer ER 0%, PR 0%, Ki67 50%, HER2 3+  4.  Enhertu  02/07/2023-09/13/2023 --------------------------------------------------------------------------------------------------------------------- PET/CT 09/30/2023: Progression of liver metastasis (3.4 cm was 1.7 cm, 2.3 cm was 1.2 cm, additional liver lesions 1.2 cm and 1.3 cm), lung nodule 0.9 cm was 0.6 cm  Current treatment: Herceptin , Tucatinib , capecitabine  started December 2024 Chemo toxicities: Slight rash on the hands left send chest: Reduce the dosage of Xeloda  to 1000 mg in the morning and 500 mg in the evening.:  Continues to moisturize and keep the hands and feet hydrated  CT CAP 01/15/2024: Liver lesions diminished in size (1.6 cm was 3.4 cm, 1.6 cm was 2.3 cm), diminished size of the right lower lobe lung nodule 0.6 cm was 0.9 cm CT CAP 04/04/2024: Stable hepatic metastases Echo: June 2025: EF 55% normal  MRI Brain 05/29/24: No evidence of recurrent tumor in Rt post temporal lobe   Recurrent sharp headaches: Could be migraines.  I encouraged her to take Excedrin migraine headache medication.    echocardiograms will be done every 6 months from this point onwards.   Plans to obtain scans which will be on 08/02/2024

## 2024-07-24 NOTE — Progress Notes (Signed)
 Patient Care Team: Alvia Bring, DO as PCP - General (Family Medicine) Izell Domino, MD as Consulting Physician (Radiation Oncology) Buckley, Arthea POUR, MD as Consulting Physician (Oncology) [facility] (Ophthalmology) Terri Alan PARAS, MD as Referring Physician (Otolaryngology) Odean Potts, MD as Medical Oncologist (Hematology and Oncology) Vanderbilt Ned, MD as Consulting Physician (General Surgery) Georgeff, Christopher S, PA-C (Dermatology)  DIAGNOSIS:  Encounter Diagnosis  Name Primary?   Malignant neoplasm of central portion of right breast in female, estrogen receptor negative (HCC) Yes    SUMMARY OF ONCOLOGIC HISTORY: Oncology History  Malignant neoplasm of central portion of right breast in female, estrogen receptor negative (HCC)  01/04/2012 Initial Diagnosis   Right breast cancer: Stage Ia ER/PR positive HER2 negative status postlumpectomy, radiation (low risk Oncotype) could not tolerate tamoxifen  for more than 30 days.   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 No pathogenic variants identified on the [facility] Multi-Cancer Panel+RNA. VUS in POLD1 identified called c.2429C>T. The report date is 07/01/2021.  The Multi-Cancer Panel + RNA  offered by [facility] includes sequencing and/or deletion duplication testing of the following 84 genes: AIP, ALK, APC, ATM, AXIN2,BAP1,  BARD1, BLM, BMPR1A, BRCA1, BRCA2, BRIP1, CASR, CDC73, CDH1, CDK4, CDKN1B, CDKN1C, CDKN2A (p14ARF), CDKN2A (p16INK4a), CEBPA, CHEK2, CTNNA1, DICER1, DIS3L2, EGFR (c.2369C>T, p.Thr790Met variant only), EPCAM (Deletion/duplication testing only), FH, FLCN, GATA2, GPC3, GREM1 (Promoter region deletion/duplication testing only), HOXB13 (c.251G>A, p.Gly84Glu), HRAS, KIT, MAX, MEN1, MET, MITF (c.952G>A, p.Glu318Lys variant only), MLH1, MSH2, MSH3, MSH6, MUTYH, NBN, NF1, NF2, NTHL1, PALB2, PDGFRA, PHOX2B, PMS2, POLD1, POLE, POT1, PRKAR1A, PTCH1, PTEN, RAD50, RAD51C, RAD51D, RB1, RECQL4, RET, RUNX1, SDHAF2, SDHA (sequence changes only), SDHB, SDHC, SDHD, SMAD4, SMARCA4, SMARCB1, SMARCE1, STK11, SUFU, TERC, TERT, TMEM127, TP53, TSC1, TSC2, VHL, WRN and WT1.   06/18/2021 Cancer Staging   Staging form: Breast, AJCC 8th Edition - Pathologic stage from 06/18/2021: Stage IA (pT1c, pN0, cM0, G3, ER-, PR-, HER2+) - Signed by Crawford Morna Pickle, NP on 10/05/2022 Stage prefix: Initial diagnosis Histologic grading system: 3 grade system   07/21/2021 - 07/06/2022 Chemotherapy   Patient is on Treatment Plan : BREAST Paclitaxel  + Trastuzumab  q7d / Trastuzumab  q21d     11/25/2021 - 01/04/2022 Radiation Therapy   Site Technique Total Dose (Gy) Dose per Fx (Gy) Completed Fx Beam Energies  Breast, Right: Breast_R 3D 45/45 1.8 25/25 6XFFF  Breast, Right: Breast_R_Bst specialPort 5.4/5.4 1.8 3/3 12E     12/29/2022 Progression   CT chest on 11/01/2022: Right upper lobe nodules largest measuring 5 mm CT chest 12/29/2022: Upper and mid lung zone predominant pulmonary nodules are worrisome for metastatic disease.  Left and right hepatic lobe masses (2.5 cm and 3.9 cm) are new and are worrisome for metastatic disease.  01/13/2023 Initial Biopsy   Right liver biopsy: Metastatic carcinoma with breast  primary, ER-, PR-, HER2 +, Ki-67 50%.    01/13/2023 Cancer Staging   Staging form: Breast, AJCC 8th Edition - Pathologic stage from 01/13/2023: Stage IV (pM1, G3, ER-, PR-, HER2+) - Signed by Crawford Morna Pickle, NP on 02/07/2023 Histologic grading system: 3 grade system   01/17/2023 PET scan   PET CT scan 01/17/2023: Hypermetabolic bilobar hepatic (2 masses measuring 2.8 cm SUV 11.1, another mass 4.1 cm SUV 10.3) and pulmonary metastases (right upper lobe lung nodule 5 mm SUV 2.6 right lower lobe 6 mm nodule SUV 1.8)    02/07/2023 - 09/13/2023 Chemotherapy   Patient is on Treatment Plan : BREAST METASTATIC Fam-Trastuzumab Deruxtecan-nxki  (Enhertu ) (5.4) q21d     10/04/2023 -  Chemotherapy   Patient is on Treatment Plan : BREAST Trastuzumab  (8/6) IV D1 + Capecitabine  +  Tucatinib  q21d       CHIEF COMPLIANT:   HISTORY OF PRESENT ILLNESS:  History of Present Illness Lori Mcmillan is a 63 year old female with breast cancer who presents for follow-up regarding her upcoming scan and ongoing treatment.  She is scheduled for a scan on October 16th and her next Herceptin  treatment on October 28th. Her previous blood work from August showed undetectable marker levels.  She has been experiencing diarrhea over the past few days, which she attributes to a possible viral infection or food reaction. She took eight doses of Imodium between 3 PM and 10 PM the previous day without relief. The diarrhea is mostly liquid, with episodes at 1:30 AM, 4:30 AM, and 7:30 AM today. She is consuming non-irritating foods like bananas.  She experiences fatigue but continues to exercise regularly, which she finds beneficial. She has no trouble taking her medications, including trametinib and Xeloda .     ALLERGIES:  has no known allergies.  MEDICATIONS:  Current Outpatient Medications  Medication Sig Dispense Refill   diphenoxylate-atropine (LOMOTIL) 2.5-0.025 MG tablet Take 1 tablet by mouth 4 (four) times  daily as needed for diarrhea or loose stools. 30 tablet 3   acetaminophen  (TYLENOL ) 500 MG tablet Take 500 mg by mouth as needed.     capecitabine  (XELODA ) 500 MG tablet Take 1 tablets (500 mg) in AM and 1 tablets (500 mg) in PM by mouth every 12 hours. Take for 14 days on, 7 days off. Repeat every 21 days. 42 tablet 3   diazepam  (VALIUM ) 2 MG tablet Take 1 tablet (2 mg total) by mouth every 8 (eight) hours as needed for anxiety. 20 tablet 0   Famotidine -Ca Carb-Mag Hydrox (PEPCID  COMPLETE PO) Take by mouth as needed.     heparin  10,000 Units in sodium chloride  0.9 % 18 mL by CRRT route continuous. Every 21 days     lidocaine -prilocaine  (EMLA ) cream Apply to affected area once 30 g 3   predniSONE  (DELTASONE ) 20 MG tablet Take 2 tablets (40 mg total) by mouth daily with breakfast. 10 tablet 0   tucatinib  (TUKYSA ) 150 MG tablet Take 2 tablets (300 mg total) by mouth 2 (two) times daily. Take every 12 hrs at the same time each day with or without a meal. 120 tablet 3   No current facility-administered medications for this visit.    PHYSICAL EXAMINATION: ECOG PERFORMANCE STATUS: 1 - Symptomatic but completely ambulatory  Vitals:   07/24/24 0908  BP: (!) 103/57  Pulse: 85  Resp: 16  Temp: 97.6 F (36.4 C)  SpO2: 97%  Filed Weights   07/24/24 0908  Weight: 130 lb 12.8 oz (59.3 kg)      LABORATORY DATA:  I have reviewed the data as listed    Latest Ref Rng & Units 05/22/2024   11:19 AM 03/20/2024   12:59 PM 02/07/2024    7:52 AM  CMP  Glucose 70 - 99 mg/dL 98  98  91   BUN 8 - 23 mg/dL 12  14  15    Creatinine 0.44 - 1.00 mg/dL 9.27  9.19  9.10   Sodium 135 - 145 mmol/L 137  140  140   Potassium 3.5 - 5.1 mmol/L 3.9  3.8  3.8   Chloride 98 - 111 mmol/L 106  107  108   CO2 22 - 32 mmol/L 26  28  24    Calcium 8.9 - 10.3 mg/dL 9.0  9.1  9.0   Total Protein 6.5 - 8.1 g/dL 6.6  6.6  6.5   Total Bilirubin 0.0 - 1.2 mg/dL 0.5  0.7  0.6   Alkaline Phos 38 - 126 U/L 115  103  117   AST  15 - 41 U/L 33  27  23   ALT 0 - 44 U/L 33  23  21     Lab Results  Component Value Date   WBC 7.2 05/22/2024   HGB 11.8 (L) 05/22/2024   HCT 34.1 (L) 05/22/2024   MCV 98.3 05/22/2024   PLT 202 05/22/2024   NEUTROABS 4.3 05/22/2024    ASSESSMENT & PLAN:  Malignant neoplasm of central portion of right breast in female, estrogen receptor negative (HCC) 06/18/2021:Right lumpectomy: 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  Adjuvant chemotherapy with Taxol  and Herceptin  followed by Herceptin  maintenance completed 07/06/2022 2. breast radiation completed 01/04/2022 3.  Metastatic disease diagnosis:CT chest on 11/01/2022: Right upper lobe nodules largest measuring 5 mm, 01/13/23: Liver Biopsy: Met breast cancer ER 0%, PR 0%, Ki67 50%, HER2 3+  4.  Enhertu  02/07/2023-09/13/2023 --------------------------------------------------------------------------------------------------------------------- PET/CT 09/30/2023: Progression of liver metastasis (3.4 cm was 1.7 cm, 2.3 cm was 1.2 cm, additional liver lesions 1.2 cm and 1.3 cm), lung nodule 0.9 cm was 0.6 cm  Current treatment: Herceptin , Tucatinib , capecitabine  started December 2024 Chemo toxicities: Slight rash on the hands left send chest: Reduce the dosage of Xeloda  to 1000 mg in the morning and 500 mg in the evening.:  Continues to moisturize and keep the hands and feet hydrated  CT CAP 01/15/2024: Liver lesions diminished in size (1.6 cm was 3.4 cm, 1.6 cm was 2.3 cm), diminished size of the right lower lobe lung nodule 0.6 cm was 0.9 cm CT CAP 04/04/2024: Stable hepatic metastases Echo: June 2025: EF 55% normal  MRI Brain 05/29/24: No evidence of recurrent tumor in Rt post  temporal lobe   Recurrent sharp headaches: Could be migraines.  I encouraged her to take Excedrin migraine headache medication.    echocardiograms will be done every 6 months from this point onwards.   Plans to obtain scans which will be on 08/02/2024 Follow-up along with her next treatment to discuss results Will add blood work today. ------------------------------------- Assessment and Plan Assessment & Plan Malignant neoplasm of central portion of right breast Marker levels  undetectable, blood work excellent, no new symptoms or complications. - Perform blood work today for upcoming scan. - Review scan results and follow up on October 28th during Herceptin  appointment.  Diarrhea Recent onset, likely viral or food-related. Imodium ineffective, reports improvement today. - Prescribe stronger antidiarrheal medication.      Orders Placed This Encounter  Procedures   CBC with Differential (Cancer Center Only)    Standing Status:   Future    Expiration Date:   07/24/2025   CMP (Cancer Center only)    Standing Status:   Future    Expiration Date:   07/24/2025   CA 27.29    Standing Status:   Future    Expiration Date:   07/24/2025   The patient has a good understanding of the overall plan. she agrees with it. she will call with any problems that may develop before the next visit here.  I personally spent a total of 30 minutes in the care of the patient today including preparing to see the patient, getting/reviewing separately obtained history, performing a medically appropriate exam/evaluation, counseling and educating, placing orders, referring and communicating with other health care professionals, documenting clinical information in the EHR, independently interpreting results, communicating results, and coordinating care.   Viinay K Jonet Mathies, MD 07/24/24

## 2024-07-25 LAB — CANCER ANTIGEN 27.29: CA 27.29: <9 U/mL (ref 0.0–38.6)

## 2024-07-27 ENCOUNTER — Encounter: Payer: Self-pay | Admitting: Family Medicine

## 2024-07-27 ENCOUNTER — Ambulatory Visit (INDEPENDENT_AMBULATORY_CARE_PROVIDER_SITE_OTHER): Admitting: Medical-Surgical

## 2024-07-27 VITALS — BP 96/62 | HR 74 | Resp 20 | Ht 64.0 in | Wt 131.0 lb

## 2024-07-27 DIAGNOSIS — R197 Diarrhea, unspecified: Secondary | ICD-10-CM | POA: Diagnosis not present

## 2024-07-27 DIAGNOSIS — Z23 Encounter for immunization: Secondary | ICD-10-CM

## 2024-07-27 NOTE — Progress Notes (Signed)
 Established patient visit   History of Present Illness   Discussed the use of AI scribe software for clinical note transcription with the patient, who gave verbal consent to proceed.  History of Present Illness   Lori Mcmillan is a 63 year old female with cancer who presents with diarrhea.  Diarrhea - Onset Sunday prior to visit (5-6 days ago) - Temporary improvement Tuesday and Wednesday, worsened after consuming leftover steak - Severe, exacerbated by eating - Lomotil prescribed during infusion visit, ineffective - Previously took eight Imodium tablets in one day without relief - Resumed Xeloda  today, which typically triggers diarrhea - Stool initially black, likely due to purple foods and dark chocolate; currently orange, slimy, and mucousy - No bright red blood in stool - Nausea without vomiting - Chills present - Fatigue, difficult to distinguish from cancer treatment effects - No fever; usual temperature around 33F  Hydration status and blood pressure - Low blood pressure readings, most recent 103/57 mmHg - Mild lightheadedness, attributed to dehydration - Increased intake of ginger ale and ginger peach tea - Acknowledges need for more water intake  Laboratory findings - Recent blood work from Tuesday shows marginally low red blood cell count and hemoglobin - White blood cell count stable  Medication and antibiotic use - No recent antibiotic use, despite having a prescription from two months ago     Physical Exam   Physical Exam Vitals reviewed.  Constitutional:      General: She is not in acute distress.    Appearance: Normal appearance.  HENT:     Head: Normocephalic and atraumatic.  Cardiovascular:     Rate and Rhythm: Normal rate and regular rhythm.     Pulses: Normal pulses.     Heart sounds: Normal heart sounds. No murmur heard.    No friction rub. No gallop.  Pulmonary:     Effort: Pulmonary effort is normal. No respiratory distress.      Breath sounds: Normal breath sounds. No wheezing.  Abdominal:     General: Abdomen is flat. There is no distension.     Palpations: Abdomen is soft. There is no mass.     Tenderness: There is no abdominal tenderness. There is no guarding or rebound.     Hernia: No hernia is present.  Skin:    General: Skin is warm and dry.  Neurological:     Mental Status: She is alert and oriented to person, place, and time.  Psychiatric:        Mood and Affect: Mood normal.        Behavior: Behavior normal.        Thought Content: Thought content normal.        Judgment: Judgment normal.    Assessment & Plan     Diarrhea with dehydration Diarrhea since Sunday, unclear etiology. Considering colitis, viral GI bug, medication side effect, or bacterial infection. Immunocompromised secondary to cancer treatments. - Order GI profile for stool evaluation. - Advise increased fluid intake, especially electrolyte water. - Discussed potential use of dicyclomine for gut spasms, but she prefers to wait for GI profile results. - Encourage consumption of probiotics or prebiotics to restore gut flora.  Immunization She is due for a flu shot and agreed to receive it today. - Administer flu shot.     Follow up   Return if symptoms worsen or fail to improve. __________________________________ Zada FREDRIK Palin, DNP, APRN, FNP-BC Primary Care and Sports Medicine [facility]

## 2024-07-27 NOTE — Telephone Encounter (Signed)
 Patient scheduled.

## 2024-08-02 ENCOUNTER — Other Ambulatory Visit: Payer: Self-pay

## 2024-08-02 ENCOUNTER — Ambulatory Visit (HOSPITAL_COMMUNITY)
Admission: RE | Admit: 2024-08-02 | Discharge: 2024-08-02 | Disposition: A | Discharge status: Home / Self Care | Source: Ambulatory Visit | Attending: Hematology and Oncology | Admitting: Hematology and Oncology

## 2024-08-02 DIAGNOSIS — C50919 Malignant neoplasm of unspecified site of unspecified female breast: Secondary | ICD-10-CM | POA: Diagnosis not present

## 2024-08-02 DIAGNOSIS — Z171 Estrogen receptor negative status [ER-]: Secondary | ICD-10-CM | POA: Insufficient documentation

## 2024-08-02 DIAGNOSIS — R911 Solitary pulmonary nodule: Secondary | ICD-10-CM | POA: Diagnosis not present

## 2024-08-02 DIAGNOSIS — C50111 Malignant neoplasm of central portion of right female breast: Secondary | ICD-10-CM | POA: Insufficient documentation

## 2024-08-02 DIAGNOSIS — K8020 Calculus of gallbladder without cholecystitis without obstruction: Secondary | ICD-10-CM | POA: Diagnosis not present

## 2024-08-02 DIAGNOSIS — C787 Secondary malignant neoplasm of liver and intrahepatic bile duct: Secondary | ICD-10-CM | POA: Diagnosis not present

## 2024-08-02 MED ORDER — HEPARIN SOD (PORK) LOCK FLUSH 100 UNIT/ML IV SOLN
500.0000 [IU] | Freq: Once | INTRAVENOUS | Status: AC
  Administered 2024-08-02: 500 [IU] via INTRAVENOUS

## 2024-08-02 MED ORDER — IOHEXOL 300 MG/ML  SOLN
100.0000 mL | Freq: Once | INTRAMUSCULAR | Status: AC | PRN
  Administered 2024-08-02: 100 mL via INTRAVENOUS

## 2024-08-04 ENCOUNTER — Encounter (INDEPENDENT_AMBULATORY_CARE_PROVIDER_SITE_OTHER): Payer: Self-pay

## 2024-08-06 ENCOUNTER — Other Ambulatory Visit: Payer: Self-pay

## 2024-08-06 NOTE — Progress Notes (Signed)
 Specialty Pharmacy Refill Coordination Note  Lori Mcmillan is a 63 y.o. female contacted today regarding refills of specialty medication(s) Capecitabine  (XELODA ); Tucatinib  (TUKYSA )   Patient requested [name] at [facility] at [place] date: 08/14/24   Medication will be filled on 10.27.25.

## 2024-08-08 ENCOUNTER — Other Ambulatory Visit: Payer: Self-pay

## 2024-08-08 ENCOUNTER — Inpatient Hospital Stay: Admitting: Hematology and Oncology

## 2024-08-08 VITALS — BP 110/70 | HR 87 | Temp 98.0°F | Resp 18 | Ht 64.0 in | Wt 130.8 lb

## 2024-08-08 DIAGNOSIS — C50111 Malignant neoplasm of central portion of right female breast: Secondary | ICD-10-CM | POA: Diagnosis not present

## 2024-08-08 DIAGNOSIS — Z171 Estrogen receptor negative status [ER-]: Secondary | ICD-10-CM | POA: Diagnosis not present

## 2024-08-08 DIAGNOSIS — Z5112 Encounter for antineoplastic immunotherapy: Secondary | ICD-10-CM | POA: Diagnosis not present

## 2024-08-08 MED ORDER — CAPECITABINE 500 MG PO TABS
ORAL_TABLET | ORAL | Status: DC
Start: 2024-08-08 — End: 2024-08-09

## 2024-08-08 NOTE — Assessment & Plan Note (Signed)
 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  Adjuvant chemotherapy with Taxol  and Herceptin  followed by Herceptin  maintenance completed 07/06/2022 2. breast radiation completed 01/04/2022 3.  Metastatic disease diagnosis:CT chest on 11/01/2022: Right upper lobe nodules largest measuring 5 mm, 01/13/23: Liver Biopsy: Met breast cancer ER 0%, PR 0%, Ki67 50%, HER2 3+  4.  Enhertu  02/07/2023-09/13/2023 --------------------------------------------------------------------------------------------------------------------- PET/CT 09/30/2023: Progression of liver metastasis (3.4 cm was 1.7 cm, 2.3 cm was 1.2 cm, additional liver lesions 1.2 cm and 1.3 cm), lung nodule 0.9 cm was 0.6 cm  Current treatment: Herceptin , Tucatinib , capecitabine  started December 2024 Chemo toxicities: Slight rash on the hands left send chest: Reduce the dosage of Xeloda  to 1000 mg in the morning and 500 mg in the evening.:  Continues to moisturize and keep the hands and feet hydrated   CT CAP 01/15/2024: Liver lesions diminished in size (1.6 cm was 3.4 cm, 1.6 cm was 2.3 cm), diminished size of the right lower lobe lung nodule 0.6 cm was 0.9 cm CT CAP 04/04/2024: Stable hepatic metastases MRI Brain 05/29/24: No evidence of recurrent tumor in Rt post temporal lobe CT CAP 08/05/24: Increased size of liver met 2.2 cm (was 1.7 cm), Stable 10 mm and 1.4 cm Left lobe of liver mets, 5 mm lung nodule stable   Recurrent sharp headaches: Could be migraines.  I encouraged her to take Excedrin migraine headache medication.    echocardiograms will be done every 6 months from this point onwards.  Discussion regarding increasing liver metastasis:  There is evidence of slight progression of disease but overall no major change in the rest of the liver lesions as well as the lung nodules.  We could consider repeating another CT scan in the short time interval versus consideration for other treatment options

## 2024-08-08 NOTE — Progress Notes (Signed)
 Patient Care Team: Alvia Bring, DO as PCP - General (Family Medicine) Izell Domino, MD as Consulting Physician (Radiation Oncology) Buckley, Arthea POUR, MD as Consulting Physician (Oncology) [facility] [place] (Ophthalmology) Terri Alan PARAS, MD as Referring Physician (Otolaryngology) Odean Potts, MD as Medical Oncologist (Hematology and Oncology) Vanderbilt Ned, MD as Consulting Physician (General Surgery) Georgeff, Christopher S, PA-C (Dermatology)  DIAGNOSIS:  Encounter Diagnosis  Name Primary?   Malignant neoplasm of central portion of right breast in female, estrogen receptor negative (HCC) Yes    SUMMARY OF ONCOLOGIC HISTORY: Oncology History  Malignant neoplasm of central portion of right breast in female, estrogen receptor negative (HCC)  01/04/2012 Initial Diagnosis   Right breast cancer: Stage Ia ER/PR positive HER2 negative status postlumpectomy, radiation (low risk Oncotype) could not tolerate tamoxifen  for more than 30 days.   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 No pathogenic variants identified on the [facility] Multi-Cancer Panel+RNA. VUS in POLD1 identified called c.2429C>T. The report date is 07/01/2021.  The Multi-Cancer Panel + RNA  offered by [facility] includes sequencing and/or deletion duplication testing of the following 84 genes: AIP, ALK, APC, ATM, AXIN2,BAP1,  BARD1, BLM, BMPR1A, BRCA1, BRCA2, BRIP1, CASR, CDC73, CDH1, CDK4, CDKN1B, CDKN1C, CDKN2A (p14ARF), CDKN2A (p16INK4a), CEBPA, CHEK2, CTNNA1, DICER1, DIS3L2, EGFR (c.2369C>T, p.Thr790Met variant only), EPCAM (Deletion/duplication testing only), FH, FLCN, GATA2, GPC3, GREM1 (Promoter region deletion/duplication testing only), HOXB13 (c.251G>A, p.Gly84Glu), HRAS, KIT, MAX, MEN1, MET, MITF (c.952G>A, p.Glu318Lys variant only), MLH1, MSH2, MSH3, MSH6, MUTYH, NBN, NF1, NF2, NTHL1, PALB2, PDGFRA, PHOX2B, PMS2, POLD1, POLE, POT1, PRKAR1A, PTCH1, PTEN, RAD50, RAD51C, RAD51D, RB1, RECQL4, RET, RUNX1, SDHAF2, SDHA (sequence changes only), SDHB, SDHC, SDHD, SMAD4, SMARCA4, SMARCB1, SMARCE1, STK11, SUFU, TERC, TERT, TMEM127, TP53, TSC1, TSC2, VHL, WRN and WT1.   06/18/2021 Cancer Staging   Staging form: Breast, AJCC 8th Edition - Pathologic stage from 06/18/2021: Stage IA (pT1c, pN0, cM0, G3, ER-, PR-, HER2+) - Signed by Crawford Morna Pickle, NP on 10/05/2022 Stage prefix: Initial diagnosis Histologic grading system: 3 grade system   07/21/2021 - 07/06/2022 Chemotherapy   Patient is on Treatment Plan : BREAST Paclitaxel  + Trastuzumab  q7d / Trastuzumab  q21d     11/25/2021 - 01/04/2022 Radiation Therapy   Site Technique Total Dose (Gy) Dose per Fx (Gy) Completed Fx Beam Energies  Breast, Right: Breast_R 3D 45/45 1.8 25/25 6XFFF  Breast, Right: Breast_R_Bst specialPort 5.4/5.4 1.8 3/3 12E     12/29/2022 Progression   CT chest on 11/01/2022: Right upper lobe nodules largest measuring 5 mm CT chest 12/29/2022: Upper and mid lung zone predominant pulmonary nodules are worrisome for metastatic disease.  Left and right hepatic lobe masses (2.5 cm and 3.9 cm) are new and are worrisome for metastatic disease.  01/13/2023 Initial Biopsy   Right liver biopsy: Metastatic carcinoma with breast  primary, ER-, PR-, HER2 +, Ki-67 50%.    01/13/2023 Cancer Staging   Staging form: Breast, AJCC 8th Edition - Pathologic stage from 01/13/2023: Stage IV (pM1, G3, ER-, PR-, HER2+) - Signed by Crawford Morna Pickle, NP on 02/07/2023 Histologic grading system: 3 grade system   01/17/2023 PET scan   PET CT scan 01/17/2023: Hypermetabolic bilobar hepatic (2 masses measuring 2.8 cm SUV 11.1, another mass 4.1 cm SUV 10.3) and pulmonary metastases (right upper lobe lung nodule 5 mm SUV 2.6 right lower lobe 6 mm nodule SUV 1.8)    02/07/2023 - 09/13/2023 Chemotherapy   Patient is on Treatment Plan : BREAST METASTATIC Fam-Trastuzumab Deruxtecan-nxki  (Enhertu ) (5.4) q21d     10/04/2023 -  Chemotherapy   Patient is on Treatment Plan : BREAST Trastuzumab  (8/6) IV D1 + Capecitabine  +  Tucatinib  q21d       CHIEF COMPLIANT: Follow-up on recent scans  HISTORY OF PRESENT ILLNESS:  History of Present Illness Lori Mcmillan is a 63 year old female with a history of multiple cancers who presents for follow-up of liver lesions.  Recent imaging shows an increase in the size of a liver lesion, while other lesions remain unchanged. She is currently on Xeloda , reduced to one pill twice daily, and takes it with food to avoid an empty stomach. Imaging also reveals an unchanged 5 mm irregular nodule in the right lower lobe of the lung, with an adjacent 3 mm nodular conformation.     ALLERGIES:  has no known allergies.  MEDICATIONS:  Current Outpatient Medications  Medication Sig Dispense Refill   acetaminophen  (TYLENOL ) 500 MG tablet Take 500 mg by mouth as needed.     capecitabine  (XELODA ) 500 MG tablet Take 1 tablets (500 mg) in AM and 1 tablets (500 mg) in PM by mouth every 12 hours. Take for 14 days on, 7 days off. Repeat every 21 days. 42 tablet 3   diphenoxylate-atropine (LOMOTIL) 2.5-0.025 MG tablet Take 1 tablet by mouth 4 (four) times daily as needed for diarrhea or loose stools. 30 tablet 3    Famotidine -Ca Carb-Mag Hydrox (PEPCID  COMPLETE PO) Take by mouth as needed.     heparin  10,000 Units in sodium chloride  0.9 % 18 mL by CRRT route continuous. Every 21 days     lidocaine -prilocaine  (EMLA ) cream Apply to affected area once 30 g 3   tucatinib  (TUKYSA ) 150 MG tablet Take 2 tablets (300 mg total) by mouth 2 (two) times daily. Take every 12 hrs at the same time each day with or without a meal. 120 tablet 3   No current facility-administered medications for this visit.    PHYSICAL EXAMINATION: ECOG PERFORMANCE STATUS: 1 - Symptomatic but completely ambulatory  Vitals:   08/08/24 1013  BP: 110/70  Pulse: 87  Resp: 18  Temp: 98 F (36.7 C)  SpO2: 99%   Filed Weights   08/08/24 1013  Weight: 130 lb 12.8 oz (59.3 kg)    LABORATORY DATA:  I have reviewed the data as listed    Latest Ref Rng & Units 07/24/2024    9:47 AM 05/22/2024   11:19 AM 03/20/2024   12:59 PM  CMP  Glucose 70 - 99 mg/dL 91  98  98   BUN 8 - 23 mg/dL 10  12  14    Creatinine 0.44 - 1.00 mg/dL 9.14  9.27  9.19   Sodium 135 - 145 mmol/L 139  137  140   Potassium 3.5 - 5.1 mmol/L 3.6  3.9  3.8   Chloride 98 - 111 mmol/L 108  106  107   CO2 22 - 32 mmol/L 28  26  28    Calcium 8.9 - 10.3 mg/dL 9.2  9.0  9.1   Total Protein 6.5 - 8.1 g/dL 6.5  6.6  6.6   Total Bilirubin 0.0 - 1.2 mg/dL 0.5  0.5  0.7   Alkaline Phos 38 - 126 U/L 94  115  103   AST 15 - 41 U/L 18  33  27   ALT 0 - 44 U/L 17  33  23     Lab Results  Component Value Date   WBC 6.9 07/24/2024   HGB 11.9 (L) 07/24/2024   HCT 35.2 (L) 07/24/2024   MCV 96.7 07/24/2024   PLT 167 07/24/2024   NEUTROABS 4.5 07/24/2024    ASSESSMENT & PLAN:  Malignant neoplasm of central portion of right breast in female, estrogen receptor negative (HCC) 06/18/2021:Right lumpectomy: Grade 3 IDC 1.5 cm, high-grade DCIS, margins negative, 0/2 lymph nodes negative, ER 0%, PR 0%, HER2 3+ positive, Ki-67 30%    (2013 Right breast cancer: Stage Ia ER/PR positive  HER2 negative status postlumpectomy, radiation (low risk Oncotype) could not tolerate tamoxifen  for more than 30 days.)   Treatment plan: 1.  Adjuvant chemotherapy with Taxol  and Herceptin  followed by Herceptin  maintenance completed 07/06/2022 2. breast radiation completed 01/04/2022 3.  Metastatic disease diagnosis:CT chest on 11/01/2022: Right upper lobe nodules largest measuring 5 mm, 01/13/23: Liver Biopsy: Met breast cancer ER 0%, PR 0%, Ki67 50%, HER2 3+  4.  Enhertu  02/07/2023-09/13/2023 --------------------------------------------------------------------------------------------------------------------- PET/CT 09/30/2023: Progression of liver metastasis (3.4 cm was 1.7 cm, 2.3 cm was 1.2 cm, additional liver lesions 1.2 cm and 1.3 cm), lung nodule 0.9 cm was 0.6 cm  Current treatment: Herceptin , Tucatinib , capecitabine  started December 2024 Chemo toxicities: Slight rash on the hands left send chest: Reduce the dosage of Xeloda  to 1000 mg in the morning and 500 mg in the evening.:  Continues to moisturize and keep the hands and feet hydrated   CT CAP 01/15/2024: Liver lesions diminished in size (1.6 cm was 3.4 cm, 1.6 cm was 2.3 cm), diminished size of the right lower lobe lung nodule 0.6 cm was 0.9 cm CT CAP 04/04/2024: Stable hepatic metastases MRI Brain 05/29/24: No evidence of recurrent tumor in Rt post temporal lobe CT CAP 08/05/24: Increased size of liver met 2.2 cm (was 1.7 cm), Stable 10 mm and 1.4 cm Left lobe of liver mets, 5 mm lung nodule stable   Recurrent sharp headaches: Could be migraines.  I encouraged her to take Excedrin migraine headache medication.    echocardiograms will be done every 6 months from this point onwards.  Discussion regarding increasing liver metastasis: There is evidence of slight progression of disease but overall no major change in the rest of the liver lesions as well as the lung nodules.   Plan:  We could consider repeating another CT scan in the short  time interval in 2 months Increase the dosage of Xeloda  to 1000 mg in a.m. and 5 mg in p.m. Follow-up in 2 months after the scans to discuss results   Assessment & Plan Metastatic right breast cancer with liver and lung metastases Liver lesion increased from 1.7 cm to 2.2 cm, not significant progression. Lung nodule stable. MRI not useful without prior comparison. - Increase Xeloda  to 2 tablets in  the morning and 1 tablet in the evening. - Schedule follow-up CT of chest, abdomen, and pelvis in December. - Follow up with provider between December 17th and 22nd.      No orders of the defined types were placed in this encounter.  The patient has a good understanding of the overall plan. she agrees with it. she will call with any problems that may develop before the next visit here.  I personally spent a total of 30 minutes in the care of the patient today including preparing to see the patient, getting/reviewing separately obtained history, performing a medically appropriate exam/evaluation, counseling and educating, placing orders, referring and communicating with other health care professionals, documenting clinical information in the EHR, independently interpreting results, communicating results, and coordinating care.   Viinay K Allisson Schindel, MD 08/08/24

## 2024-08-09 ENCOUNTER — Other Ambulatory Visit: Payer: Self-pay

## 2024-08-09 ENCOUNTER — Other Ambulatory Visit: Payer: Self-pay | Admitting: Hematology and Oncology

## 2024-08-09 ENCOUNTER — Other Ambulatory Visit: Payer: Self-pay | Admitting: Pharmacist

## 2024-08-09 DIAGNOSIS — C50111 Malignant neoplasm of central portion of right female breast: Secondary | ICD-10-CM

## 2024-08-09 MED ORDER — CAPECITABINE 500 MG PO TABS
ORAL_TABLET | ORAL | 0 refills | Status: DC
  Filled 2024-08-09: qty 42, 21d supply, fill #0

## 2024-08-13 ENCOUNTER — Other Ambulatory Visit: Payer: Self-pay

## 2024-08-14 ENCOUNTER — Inpatient Hospital Stay

## 2024-08-14 VITALS — BP 125/85 | HR 63 | Temp 97.9°F | Resp 16 | Wt 130.5 lb

## 2024-08-14 DIAGNOSIS — Z5112 Encounter for antineoplastic immunotherapy: Secondary | ICD-10-CM | POA: Diagnosis not present

## 2024-08-14 DIAGNOSIS — C50111 Malignant neoplasm of central portion of right female breast: Secondary | ICD-10-CM

## 2024-08-14 MED ORDER — DIPHENHYDRAMINE HCL 25 MG PO CAPS
25.0000 mg | ORAL_CAPSULE | Freq: Once | ORAL | Status: AC
  Administered 2024-08-14: 25 mg via ORAL
  Filled 2024-08-14: qty 1

## 2024-08-14 MED ORDER — SODIUM CHLORIDE 0.9 % IV SOLN: INTRAVENOUS | Status: DC

## 2024-08-14 MED ORDER — SODIUM CHLORIDE 0.9% FLUSH
10.0000 mL | INTRAVENOUS | Status: DC | PRN
  Administered 2024-08-14: 10 mL

## 2024-08-14 MED ORDER — TRASTUZUMAB CHEMO 150 MG IV SOLR
6.0000 mg/kg | Freq: Once | INTRAVENOUS | Status: AC
  Administered 2024-08-14: 378 mg via INTRAVENOUS
  Filled 2024-08-14: qty 18

## 2024-08-14 MED ORDER — ACETAMINOPHEN 325 MG PO TABS
650.0000 mg | ORAL_TABLET | Freq: Once | ORAL | Status: AC
  Administered 2024-08-14: 650 mg via ORAL
  Filled 2024-08-14: qty 2

## 2024-08-14 NOTE — Patient Instructions (Signed)
 [facility] - A DEPT OF [facility]  Discharge Instructions: Thank you for choosing [facility] to provide your oncology and hematology care.   If you have a lab appointment with the Cancer Center, please go directly to the Cancer Center and check in at the registration area.   Wear comfortable clothing and clothing appropriate for easy access to any Portacath or PICC line.   We strive to give you quality time with your provider. You may need to reschedule your appointment if you arrive late (15 or more minutes).  Arriving late affects you and other patients whose appointments are after yours.  Also, if you miss three or more appointments without notifying the office, you may be dismissed from the clinic at the provider's discretion.      For prescription refill requests, have your pharmacy contact our office and allow 72 hours for refills to be completed.    Today you received the following chemotherapy and/or immunotherapy agent: Trastuzumab    To help prevent nausea and vomiting after your treatment, we encourage you to take your nausea medication as directed.  BELOW ARE SYMPTOMS THAT SHOULD BE REPORTED IMMEDIATELY: *FEVER GREATER THAN 100.4 F (38 C) OR HIGHER *CHILLS OR SWEATING *NAUSEA AND VOMITING THAT IS NOT CONTROLLED WITH YOUR NAUSEA MEDICATION *UNUSUAL SHORTNESS OF BREATH *UNUSUAL BRUISING OR BLEEDING *URINARY PROBLEMS (pain or burning when urinating, or frequent urination) *BOWEL PROBLEMS (unusual diarrhea, constipation, pain near the anus) TENDERNESS IN MOUTH AND THROAT WITH OR WITHOUT PRESENCE OF ULCERS (sore throat, sores in mouth, or a toothache) UNUSUAL RASH, SWELLING OR PAIN  UNUSUAL VAGINAL DISCHARGE OR ITCHING   Items with * indicate a potential emergency and should be followed up as soon as possible or go to the Emergency Department if any problems should occur.  Please show the CHEMOTHERAPY ALERT CARD or IMMUNOTHERAPY  ALERT CARD at check-in to the Emergency Department and triage nurse.  Should you have questions after your visit or need to cancel or reschedule your appointment, please contact [facility] - A DEPT OF [facility]  Dept: [phone]  and follow the prompts.  Office hours are 8:00 a.m. to 4:30 p.m. Monday - Friday. Please note that voicemails left after 4:00 p.m. may not be returned until the following business day.  We are closed weekends and major holidays. You have access to a nurse at all times for urgent questions. Please call the main number to the clinic Dept: [phone] and follow the prompts.   For any non-urgent questions, you may also contact your provider using MyChart. We now offer e-Visits for anyone 44 and older to request care online for non-urgent symptoms. For details visit [email or web address].   Also download the MyChart app! Go to the app store, search MyChart, open the app, select , and log in with your MyChart username and password.

## 2024-08-27 ENCOUNTER — Other Ambulatory Visit: Payer: Self-pay

## 2024-08-27 ENCOUNTER — Telehealth: Payer: Self-pay

## 2024-08-27 ENCOUNTER — Encounter: Payer: Self-pay | Admitting: Hematology and Oncology

## 2024-08-27 ENCOUNTER — Other Ambulatory Visit (HOSPITAL_COMMUNITY): Payer: Self-pay

## 2024-08-27 NOTE — Telephone Encounter (Signed)
 Oral Oncology Patient Advocate Encounter  Was successful in securing patient a $7500 grant from [facility] to provide copayment coverage for Tukysa .  This will keep the out of pocket expense at $0.     [employer] ID: [id number]   The billing information is as follows and has been shared with [facility].    RxBin: [id number] PCN: [id number] Member ID: [id number] Group ID: [id number] Dates of Eligibility: 09/10/24 through 09/09/25  Fund:  Breast  Lucie Lamer, CPhT [place]  [facility] Oncology Pharmacy Patient Advocate Specialist [place] Phone: [phone]  Fax: [phone] [email or web address]

## 2024-08-27 NOTE — Progress Notes (Signed)
 Opened in error

## 2024-08-28 ENCOUNTER — Other Ambulatory Visit: Payer: Self-pay

## 2024-08-28 ENCOUNTER — Other Ambulatory Visit: Payer: Self-pay | Admitting: Hematology and Oncology

## 2024-08-28 DIAGNOSIS — Z171 Estrogen receptor negative status [ER-]: Secondary | ICD-10-CM

## 2024-08-28 MED ORDER — CAPECITABINE 500 MG PO TABS
ORAL_TABLET | ORAL | 0 refills | Status: DC
  Filled 2024-08-28 – 2024-08-29 (×2): qty 42, 21d supply, fill #0

## 2024-08-29 ENCOUNTER — Other Ambulatory Visit: Payer: Self-pay

## 2024-08-30 ENCOUNTER — Encounter (INDEPENDENT_AMBULATORY_CARE_PROVIDER_SITE_OTHER): Payer: Self-pay

## 2024-08-30 ENCOUNTER — Other Ambulatory Visit (HOSPITAL_COMMUNITY): Payer: Self-pay

## 2024-08-30 ENCOUNTER — Other Ambulatory Visit: Payer: Self-pay

## 2024-08-30 NOTE — Progress Notes (Signed)
 Specialty Pharmacy Refill Coordination Note  MyChart Questionnaire Submission  Lori Mcmillan is a 63 y.o. female contacted today regarding refills of specialty medication(s) Xeloda .  Doses on hand: (Patient-Rptd) XELODA  9 DOSES   Patient requested: (Patient-Rptd) Pickup at [facility] at [facility] date: 09/04/24  Medication will be filled on 09/03/24.

## 2024-09-03 ENCOUNTER — Other Ambulatory Visit: Payer: Self-pay

## 2024-09-04 ENCOUNTER — Inpatient Hospital Stay: Attending: Hematology and Oncology

## 2024-09-04 VITALS — BP 106/96 | HR 72 | Temp 97.8°F | Resp 18 | Wt 132.0 lb

## 2024-09-04 DIAGNOSIS — C787 Secondary malignant neoplasm of liver and intrahepatic bile duct: Secondary | ICD-10-CM | POA: Insufficient documentation

## 2024-09-04 DIAGNOSIS — Z171 Estrogen receptor negative status [ER-]: Secondary | ICD-10-CM | POA: Insufficient documentation

## 2024-09-04 DIAGNOSIS — C50111 Malignant neoplasm of central portion of right female breast: Secondary | ICD-10-CM | POA: Diagnosis present

## 2024-09-04 DIAGNOSIS — Z1722 Progesterone receptor negative status: Secondary | ICD-10-CM | POA: Insufficient documentation

## 2024-09-04 DIAGNOSIS — Z1731 Human epidermal growth factor receptor 2 positive status: Secondary | ICD-10-CM | POA: Insufficient documentation

## 2024-09-04 DIAGNOSIS — Z5112 Encounter for antineoplastic immunotherapy: Secondary | ICD-10-CM | POA: Diagnosis present

## 2024-09-04 DIAGNOSIS — C7800 Secondary malignant neoplasm of unspecified lung: Secondary | ICD-10-CM | POA: Insufficient documentation

## 2024-09-04 MED ORDER — SODIUM CHLORIDE 0.9 % IV SOLN: INTRAVENOUS | Status: DC

## 2024-09-04 MED ORDER — TRASTUZUMAB CHEMO 150 MG IV SOLR
6.0000 mg/kg | Freq: Once | INTRAVENOUS | Status: AC
  Administered 2024-09-04: 378 mg via INTRAVENOUS
  Filled 2024-09-04: qty 18

## 2024-09-04 MED ORDER — DIPHENHYDRAMINE HCL 25 MG PO CAPS
25.0000 mg | ORAL_CAPSULE | Freq: Once | ORAL | Status: AC
  Administered 2024-09-04: 25 mg via ORAL
  Filled 2024-09-04: qty 1

## 2024-09-04 MED ORDER — ACETAMINOPHEN 325 MG PO TABS
650.0000 mg | ORAL_TABLET | Freq: Once | ORAL | Status: AC
  Administered 2024-09-04: 650 mg via ORAL
  Filled 2024-09-04: qty 2

## 2024-09-04 NOTE — Patient Instructions (Signed)
 [facility] - A DEPT OF [facility]  Discharge Instructions: Thank you for choosing [facility] to provide your oncology and hematology care.   If you have a lab appointment with the Cancer Center, please go directly to the Cancer Center and check in at the registration area.   Wear comfortable clothing and clothing appropriate for easy access to any Portacath or PICC line.   We strive to give you quality time with your provider. You may need to reschedule your appointment if you arrive late (15 or more minutes).  Arriving late affects you and other patients whose appointments are after yours.  Also, if you miss three or more appointments without notifying the office, you may be dismissed from the clinic at the provider's discretion.      For prescription refill requests, have your pharmacy contact our office and allow 72 hours for refills to be completed.    Today you received the following chemotherapy and/or immunotherapy agents: trastuzumab  (HERCEPTIN )      To help prevent nausea and vomiting after your treatment, we encourage you to take your nausea medication as directed.  BELOW ARE SYMPTOMS THAT SHOULD BE REPORTED IMMEDIATELY: *FEVER GREATER THAN 100.4 F (38 C) OR HIGHER *CHILLS OR SWEATING *NAUSEA AND VOMITING THAT IS NOT CONTROLLED WITH YOUR NAUSEA MEDICATION *UNUSUAL SHORTNESS OF BREATH *UNUSUAL BRUISING OR BLEEDING *URINARY PROBLEMS (pain or burning when urinating, or frequent urination) *BOWEL PROBLEMS (unusual diarrhea, constipation, pain near the anus) TENDERNESS IN MOUTH AND THROAT WITH OR WITHOUT PRESENCE OF ULCERS (sore throat, sores in mouth, or a toothache) UNUSUAL RASH, SWELLING OR PAIN  UNUSUAL VAGINAL DISCHARGE OR ITCHING   Items with * indicate a potential emergency and should be followed up as soon as possible or go to the Emergency Department if any problems should occur.  Please show the CHEMOTHERAPY ALERT CARD or  IMMUNOTHERAPY ALERT CARD at check-in to the Emergency Department and triage nurse.  Should you have questions after your visit or need to cancel or reschedule your appointment, please contact [facility] - A DEPT OF [facility]  Dept: [phone]  and follow the prompts.  Office hours are 8:00 a.m. to 4:30 p.m. Monday - Friday. Please note that voicemails left after 4:00 p.m. may not be returned until the following business day.  We are closed weekends and major holidays. You have access to a nurse at all times for urgent questions. Please call the main number to the clinic Dept: [phone] and follow the prompts.   For any non-urgent questions, you may also contact your provider using MyChart. We now offer e-Visits for anyone 56 and older to request care online for non-urgent symptoms. For details visit [email or web address].   Also download the MyChart app! Go to the app store, search MyChart, open the app, select [facility], and log in with your MyChart username and password.

## 2024-09-06 ENCOUNTER — Other Ambulatory Visit (HOSPITAL_COMMUNITY): Payer: Self-pay

## 2024-09-07 ENCOUNTER — Other Ambulatory Visit: Payer: Self-pay

## 2024-09-07 NOTE — Progress Notes (Signed)
 Specialty Pharmacy Refill Coordination Note  Lori Mcmillan is a 63 y.o. female contacted today regarding refills of specialty medication(s) Tucatinib  (TUKYSA )   Patient requested [name] at [facility] at [place] date: 09/08/24   Medication will be filled on: 09/07/24

## 2024-09-11 ENCOUNTER — Other Ambulatory Visit: Payer: Self-pay

## 2024-09-11 NOTE — Progress Notes (Signed)
 Specialty Pharmacy Ongoing Clinical Assessment Note  Lori Mcmillan is a 63 y.o. female who is being followed by the specialty pharmacy service for RxSp Oncology   Patient's specialty medication(s) reviewed today: Capecitabine  (XELODA ); Tucatinib  (TUKYSA )   Missed doses in the last 4 weeks: 0   Patient/Caregiver did not have any additional questions or concerns.   Therapeutic benefit summary: Patient is achieving benefit (Per Dr. Gara office visit notes from 08/08/24 one of the liver mets increased in size while all other mets remained stable, he plans to do a repeat CT soon)   Adverse events/side effects summary: Experienced adverse events/side effects (severe fatigue, moderate diarrhea (controlled with Lomotil ), redness on face, arms, and legs- comes and goes, peeling skin on hands and feet (uses topicals, I recommended Udderly Smooth), she reports that all side effects remain tolerable at this time)   Patient's therapy is appropriate to: Continue    Goals Addressed             This Visit's Progress    Maintain optimal adherence to therapy   On track    Patient is on track. Patient will maintain adherence         Follow up: 3 months  Silvano LOISE Dolly Specialty Pharmacist

## 2024-09-18 ENCOUNTER — Other Ambulatory Visit: Payer: Self-pay | Admitting: Pharmacy Technician

## 2024-09-18 ENCOUNTER — Other Ambulatory Visit: Payer: Self-pay | Admitting: Hematology and Oncology

## 2024-09-18 ENCOUNTER — Other Ambulatory Visit: Payer: Self-pay

## 2024-09-18 DIAGNOSIS — C50111 Malignant neoplasm of central portion of right female breast: Secondary | ICD-10-CM

## 2024-09-18 NOTE — Progress Notes (Signed)
 Specialty Pharmacy Refill Coordination Note  Lori Mcmillan is a 63 y.o. female contacted today regarding refills of specialty medication(s) Capecitabine  (XELODA )   Patient requested (Patient-Rptd) Pickup at [facility] date: (Patient-Rptd) 09/25/24   Medication will be filled on: 09/24/2024  This fill date is pending response to refill request from provider. Patient is aware and if they have not received fill by intended date they must follow up with pharmacy.

## 2024-09-19 ENCOUNTER — Other Ambulatory Visit (HOSPITAL_COMMUNITY): Payer: Self-pay

## 2024-09-19 ENCOUNTER — Other Ambulatory Visit: Payer: Self-pay

## 2024-09-19 MED ORDER — CAPECITABINE 500 MG PO TABS
ORAL_TABLET | ORAL | 0 refills | Status: DC
  Filled 2024-09-19: qty 42, 21d supply, fill #0

## 2024-09-24 ENCOUNTER — Other Ambulatory Visit (HOSPITAL_COMMUNITY): Payer: Self-pay

## 2024-09-24 ENCOUNTER — Other Ambulatory Visit: Payer: Self-pay

## 2024-09-25 ENCOUNTER — Inpatient Hospital Stay: Attending: Hematology and Oncology

## 2024-09-26 ENCOUNTER — Other Ambulatory Visit: Payer: Self-pay | Admitting: *Deleted

## 2024-09-26 ENCOUNTER — Inpatient Hospital Stay: Attending: Hematology and Oncology

## 2024-09-26 DIAGNOSIS — Z8041 Family history of malignant neoplasm of ovary: Secondary | ICD-10-CM | POA: Insufficient documentation

## 2024-09-26 DIAGNOSIS — Z5112 Encounter for antineoplastic immunotherapy: Secondary | ICD-10-CM | POA: Diagnosis present

## 2024-09-26 DIAGNOSIS — Z803 Family history of malignant neoplasm of breast: Secondary | ICD-10-CM | POA: Diagnosis not present

## 2024-09-26 DIAGNOSIS — Z808 Family history of malignant neoplasm of other organs or systems: Secondary | ICD-10-CM | POA: Insufficient documentation

## 2024-09-26 DIAGNOSIS — Z923 Personal history of irradiation: Secondary | ICD-10-CM | POA: Diagnosis not present

## 2024-09-26 DIAGNOSIS — C787 Secondary malignant neoplasm of liver and intrahepatic bile duct: Secondary | ICD-10-CM | POA: Insufficient documentation

## 2024-09-26 DIAGNOSIS — C50111 Malignant neoplasm of central portion of right female breast: Secondary | ICD-10-CM | POA: Insufficient documentation

## 2024-09-26 DIAGNOSIS — Z801 Family history of malignant neoplasm of trachea, bronchus and lung: Secondary | ICD-10-CM | POA: Diagnosis not present

## 2024-09-26 DIAGNOSIS — Z1721 Progesterone receptor positive status: Secondary | ICD-10-CM | POA: Diagnosis not present

## 2024-09-26 DIAGNOSIS — C7800 Secondary malignant neoplasm of unspecified lung: Secondary | ICD-10-CM | POA: Diagnosis not present

## 2024-09-26 DIAGNOSIS — Z9049 Acquired absence of other specified parts of digestive tract: Secondary | ICD-10-CM | POA: Insufficient documentation

## 2024-09-26 DIAGNOSIS — Z170 Estrogen receptor positive status [ER+]: Secondary | ICD-10-CM | POA: Insufficient documentation

## 2024-09-26 DIAGNOSIS — Z83719 Family history of colon polyps, unspecified: Secondary | ICD-10-CM | POA: Insufficient documentation

## 2024-09-26 DIAGNOSIS — Z800 Family history of malignant neoplasm of digestive organs: Secondary | ICD-10-CM | POA: Insufficient documentation

## 2024-09-26 DIAGNOSIS — Z87891 Personal history of nicotine dependence: Secondary | ICD-10-CM | POA: Diagnosis not present

## 2024-09-26 DIAGNOSIS — Z85828 Personal history of other malignant neoplasm of skin: Secondary | ICD-10-CM | POA: Insufficient documentation

## 2024-09-26 DIAGNOSIS — Z5181 Encounter for therapeutic drug level monitoring: Secondary | ICD-10-CM

## 2024-09-26 DIAGNOSIS — Z1732 Human epidermal growth factor receptor 2 negative status: Secondary | ICD-10-CM | POA: Insufficient documentation

## 2024-09-26 MED ORDER — ACETAMINOPHEN 325 MG PO TABS
650.0000 mg | ORAL_TABLET | Freq: Once | ORAL | Status: AC
  Administered 2024-09-26: 650 mg via ORAL
  Filled 2024-09-26: qty 2

## 2024-09-26 MED ORDER — TRASTUZUMAB CHEMO 150 MG IV SOLR
6.0000 mg/kg | Freq: Once | INTRAVENOUS | Status: AC
  Administered 2024-09-26: 378 mg via INTRAVENOUS
  Filled 2024-09-26: qty 18

## 2024-09-26 MED ORDER — SODIUM CHLORIDE 0.9 % IV SOLN: INTRAVENOUS | Status: DC

## 2024-09-26 MED ORDER — DIPHENHYDRAMINE HCL 25 MG PO CAPS
25.0000 mg | ORAL_CAPSULE | Freq: Once | ORAL | Status: AC
  Administered 2024-09-26: 25 mg via ORAL
  Filled 2024-09-26: qty 1

## 2024-09-26 NOTE — Progress Notes (Signed)
 Per MD okay to treat today with echo from 03/19/24.  Verbal orders received and placed to obtain repeat.

## 2024-09-26 NOTE — Progress Notes (Signed)
 Ok to proceed with tx today w/o current ECHO per Dr. Odean. Next ECHO is in the process of being scheduled.  Charmon Thorson, PharmD, MBA

## 2024-09-27 ENCOUNTER — Other Ambulatory Visit: Payer: Self-pay

## 2024-09-27 ENCOUNTER — Other Ambulatory Visit: Payer: Self-pay | Admitting: Hematology and Oncology

## 2024-09-27 DIAGNOSIS — C50111 Malignant neoplasm of central portion of right female breast: Secondary | ICD-10-CM

## 2024-09-28 ENCOUNTER — Other Ambulatory Visit (HOSPITAL_COMMUNITY): Payer: Self-pay

## 2024-09-28 ENCOUNTER — Other Ambulatory Visit: Payer: Self-pay

## 2024-09-28 MED ORDER — TUCATINIB 150 MG PO TABS
300.0000 mg | ORAL_TABLET | Freq: Two times a day (BID) | ORAL | 3 refills | Status: DC
  Filled 2024-09-28 (×2): qty 120, 30d supply, fill #0

## 2024-09-28 NOTE — Progress Notes (Signed)
 Specialty Pharmacy Refill Coordination Note  MyChart Questionnaire Submission  Lori Mcmillan is a 63 y.o. female contacted today regarding refills of specialty medication(s) Tukysa .  Doses on hand: 48 on 12/12   Patient requested: (Patient-Rptd) Pickup at [facility] date: 10/16/24  Medication will be filled on 10/15/24

## 2024-10-04 ENCOUNTER — Inpatient Hospital Stay

## 2024-10-04 ENCOUNTER — Ambulatory Visit (HOSPITAL_COMMUNITY): Admission: RE | Admit: 2024-10-04 | Discharge: 2024-10-04 | Attending: Hematology and Oncology

## 2024-10-04 DIAGNOSIS — Z171 Estrogen receptor negative status [ER-]: Secondary | ICD-10-CM | POA: Insufficient documentation

## 2024-10-04 DIAGNOSIS — C50111 Malignant neoplasm of central portion of right female breast: Secondary | ICD-10-CM | POA: Insufficient documentation

## 2024-10-04 DIAGNOSIS — Z5112 Encounter for antineoplastic immunotherapy: Secondary | ICD-10-CM | POA: Diagnosis not present

## 2024-10-04 LAB — CBC WITH DIFFERENTIAL (CANCER CENTER ONLY)
Abs Immature Granulocytes: 0.01 K/uL (ref 0.00–0.07)
Basophils Absolute: 0.1 K/uL (ref 0.0–0.1)
Basophils Relative: 1 %
Eosinophils Absolute: 0.2 K/uL (ref 0.0–0.5)
Eosinophils Relative: 3 %
HCT: 36.2 % (ref 36.0–46.0)
Hemoglobin: 12.2 g/dL (ref 12.0–15.0)
Immature Granulocytes: 0 %
Lymphocytes Relative: 38 %
Lymphs Abs: 2.1 K/uL (ref 0.7–4.0)
MCH: 32.2 pg (ref 26.0–34.0)
MCHC: 33.7 g/dL (ref 30.0–36.0)
MCV: 95.5 fL (ref 80.0–100.0)
Monocytes Absolute: 0.6 K/uL (ref 0.1–1.0)
Monocytes Relative: 10 %
Neutro Abs: 2.7 K/uL (ref 1.7–7.7)
Neutrophils Relative %: 48 %
Platelet Count: 204 K/uL (ref 150–400)
RBC: 3.79 MIL/uL — ABNORMAL LOW (ref 3.87–5.11)
RDW: 14.3 % (ref 11.5–15.5)
WBC Count: 5.7 K/uL (ref 4.0–10.5)
nRBC: 0 % (ref 0.0–0.2)

## 2024-10-04 LAB — CMP (CANCER CENTER ONLY)
ALT: 22 U/L (ref 0–44)
AST: 27 U/L (ref 15–41)
Albumin: 4.4 g/dL (ref 3.5–5.0)
Alkaline Phosphatase: 110 U/L (ref 38–126)
Anion gap: 9 (ref 5–15)
BUN: 14 mg/dL (ref 8–23)
CO2: 24 mmol/L (ref 22–32)
Calcium: 9.1 mg/dL (ref 8.9–10.3)
Chloride: 105 mmol/L (ref 98–111)
Creatinine: 0.88 mg/dL (ref 0.44–1.00)
GFR, Estimated: >60 mL/min (ref >60)
Glucose, Bld: 92 mg/dL (ref 70–99)
Potassium: 3.8 mmol/L (ref 3.5–5.1)
Sodium: 139 mmol/L (ref 135–145)
Total Bilirubin: 0.5 mg/dL (ref 0.0–1.2)
Total Protein: 6.8 g/dL (ref 6.5–8.1)

## 2024-10-04 MED ORDER — HEPARIN SOD (PORK) LOCK FLUSH 100 UNIT/ML IV SOLN
500.0000 [IU] | Freq: Once | INTRAVENOUS | Status: AC
  Administered 2024-10-04: 10:00:00 500 [IU] via INTRAVENOUS

## 2024-10-04 MED ORDER — HEPARIN SOD (PORK) LOCK FLUSH 100 UNIT/ML IV SOLN
INTRAVENOUS | Status: AC
  Filled 2024-10-04: qty 5

## 2024-10-04 MED ORDER — IOHEXOL 300 MG/ML  SOLN
100.0000 mL | Freq: Once | INTRAMUSCULAR | Status: AC | PRN
  Administered 2024-10-04: 10:00:00 100 mL via INTRAVENOUS

## 2024-10-05 LAB — CANCER ANTIGEN 27.29: CA 27.29: 12.7 U/mL (ref 0.0–38.6)

## 2024-10-08 ENCOUNTER — Inpatient Hospital Stay: Admitting: Hematology and Oncology

## 2024-10-08 VITALS — BP 118/78 | HR 81 | Temp 97.6°F | Resp 18 | Ht 64.0 in | Wt 130.7 lb

## 2024-10-08 DIAGNOSIS — C50111 Malignant neoplasm of central portion of right female breast: Secondary | ICD-10-CM

## 2024-10-08 DIAGNOSIS — Z5112 Encounter for antineoplastic immunotherapy: Secondary | ICD-10-CM | POA: Diagnosis not present

## 2024-10-08 DIAGNOSIS — Z171 Estrogen receptor negative status [ER-]: Secondary | ICD-10-CM

## 2024-10-08 NOTE — Assessment & Plan Note (Signed)
 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  Adjuvant chemotherapy with Taxol  and Herceptin  followed by Herceptin  maintenance completed 07/06/2022 2. breast radiation completed 01/04/2022 3.  Metastatic disease diagnosis:CT chest on 11/01/2022: Right upper lobe nodules largest measuring 5 mm, 01/13/23: Liver Biopsy: Met breast cancer ER 0%, PR 0%, Ki67 50%, HER2 3+  4.  Enhertu  02/07/2023-09/13/2023 5.  Herceptin  Tucatinib  Xeloda  December 2024-December 2025 ---------------------------------------------------------------------------------------------------------------------   CT CAP 01/15/2024: Liver lesions diminished in size (1.6 cm was 3.4 cm, 1.6 cm was 2.3 cm), diminished size of the right lower lobe lung nodule 0.6 cm was 0.9 cm CT CAP 04/04/2024: Stable hepatic metastases MRI Brain 05/29/24: No evidence of recurrent tumor in Rt post temporal lobe CT CAP 08/05/24: Increased size of liver met 2.2 cm (was 1.7 cm), Stable 10 mm and 1.4 cm Left lobe of liver mets, 5 mm lung nodule stable CT CAP 10/05/2024: Right lower lobe nodules slightly enlarged, increase in subcapsular right hepatic lobe 2.4 x 3 cm (was 1.8 x 2.2 cm), left hepatic lobe: Stable     echocardiograms will be done every 6 months from this point onwards.   Discussion regarding increasing liver metastasis:   Plan:  Option to participate in clinical trials Taxotere Herceptin  Perjeta Margetuximab

## 2024-10-08 NOTE — Progress Notes (Signed)
 "  Patient Care Team: Alvia Bring, DO as PCP - General (Family Medicine) Izell Domino, MD as Consulting Physician (Radiation Oncology) Buckley, Arthea POUR, MD as Consulting Physician (Oncology) [facility] [place] (Ophthalmology) Terri Alan PARAS, MD as Referring Physician (Otolaryngology) Odean Potts, MD as Medical Oncologist (Hematology and Oncology) Vanderbilt Ned, MD as Consulting Physician (General Surgery) Elisa Lonni GORMAN DEVONNA (Dermatology)  DIAGNOSIS:  Encounter Diagnosis  Name Primary?   Malignant neoplasm of central portion of right breast in female, estrogen receptor negative (HCC) Yes    SUMMARY OF ONCOLOGIC HISTORY: Oncology History  Malignant neoplasm of central portion of right breast in female, estrogen receptor negative (HCC)  01/04/2012 Initial Diagnosis   Right breast cancer: Stage Ia ER/PR positive HER2 negative status postlumpectomy, radiation (low risk Oncotype) could not tolerate tamoxifen  for more than 30 days.   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 No pathogenic variants identified on the [facility] Multi-Cancer Panel+RNA. VUS in POLD1 identified called c.2429C>T. The report date is 07/01/2021.  The Multi-Cancer Panel + RNA  offered by [facility] includes sequencing and/or deletion duplication testing of the following 84 genes: AIP, ALK, APC, ATM, AXIN2,BAP1,  BARD1, BLM, BMPR1A, BRCA1, BRCA2, BRIP1, CASR, CDC73, CDH1, CDK4, CDKN1B, CDKN1C, CDKN2A (p14ARF), CDKN2A (p16INK4a), CEBPA, CHEK2, CTNNA1, DICER1, DIS3L2, EGFR (c.2369C>T, p.Thr790Met variant only), EPCAM (Deletion/duplication testing only), FH, FLCN, GATA2, GPC3, GREM1 (Promoter region deletion/duplication testing only), HOXB13 (c.251G>A, p.Gly84Glu), HRAS, KIT, MAX, MEN1, MET, MITF (c.952G>A, p.Glu318Lys variant only), MLH1, MSH2, MSH3, MSH6, MUTYH, NBN, NF1, NF2, NTHL1, PALB2, PDGFRA, PHOX2B, PMS2, POLD1, POLE, POT1, PRKAR1A, PTCH1, PTEN, RAD50, RAD51C, RAD51D, RB1, RECQL4, RET, RUNX1, SDHAF2, SDHA (sequence changes only), SDHB, SDHC, SDHD, SMAD4, SMARCA4, SMARCB1, SMARCE1, STK11, SUFU, TERC, TERT, TMEM127, TP53, TSC1, TSC2, VHL, WRN and WT1.   06/18/2021 Cancer Staging   Staging form: Breast, AJCC 8th Edition - Pathologic stage from 06/18/2021: Stage IA (pT1c, pN0, cM0, G3, ER-, PR-, HER2+) - Signed by Crawford Morna Pickle, NP on 10/05/2022 Stage prefix: Initial diagnosis Histologic grading system: 3 grade system   07/21/2021 - 07/06/2022 Chemotherapy   Patient is on Treatment Plan : BREAST Paclitaxel  + Trastuzumab  q7d / Trastuzumab  q21d     11/25/2021 - 01/04/2022 Radiation Therapy   Site Technique Total Dose (Gy) Dose per Fx (Gy) Completed Fx Beam Energies  Breast, Right: Breast_R 3D 45/45 1.8 25/25 6XFFF  Breast, Right: Breast_R_Bst specialPort 5.4/5.4 1.8 3/3 12E     12/29/2022 Progression   CT chest on 11/01/2022: Right upper lobe nodules largest measuring 5 mm CT chest 12/29/2022: Upper and mid lung zone predominant pulmonary nodules are worrisome for metastatic disease.  Left and right hepatic lobe masses (2.5 cm and 3.9 cm) are new and are worrisome for metastatic disease.  01/13/2023 Initial Biopsy   Right liver biopsy: Metastatic carcinoma with breast  primary, ER-, PR-, HER2 +, Ki-67 50%.    01/13/2023 Cancer Staging   Staging form: Breast, AJCC 8th Edition - Pathologic stage from 01/13/2023: Stage IV (pM1, G3, ER-, PR-, HER2+) - Signed by Crawford Morna Pickle, NP on 02/07/2023 Histologic grading system: 3 grade system   01/17/2023 PET scan   PET CT scan 01/17/2023: Hypermetabolic bilobar hepatic (2 masses measuring 2.8 cm SUV 11.1, another mass 4.1 cm SUV 10.3) and pulmonary metastases (right upper lobe lung nodule 5 mm SUV 2.6 right lower lobe 6 mm nodule SUV 1.8)    02/07/2023 - 09/13/2023 Chemotherapy   Patient is on Treatment Plan : BREAST METASTATIC Fam-Trastuzumab Deruxtecan-nxki  (Enhertu ) (5.4) q21d     10/04/2023 -  Chemotherapy   Patient is on Treatment Plan : BREAST Trastuzumab  (8/6) IV D1 + Capecitabine  +  Tucatinib  q21d       CHIEF COMPLIANT:   HISTORY OF PRESENT ILLNESS: Discussed the use of AI scribe software for clinical note transcription with the patient, who gave verbal consent to proceed.  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  She has metastatic HER2-positive breast cancer with liver and lung metastases managed with tucatinib , capecitabine , and trastuzumab  for over one year after prior progression on Enhertu . Recent imaging shows a stable left hepatic lesion and enlargement of a right hepatic lesion from 2.1 cm to 2.9 cm. She has no abdominal pain, jaundice, or other liver-related symptoms. Pulmonary nodules appear more solid and defined but remain small and asymptomatic. She has no respiratory complaints and maintains excellent exercise tolerance, including a recent 1.5-mile hilly hike, with oxygen saturation of 99%.  She is tolerating her current systemic therapy after prior dose  reduction for diarrhea and hand-foot syndrome. She has a facial rash that is not bothersome and has had multiple prior chemotherapy regimens with alopecia.  Additional imaging shows old right rib fractures and mild spinal arthritis, both asymptomatic. Gallstones and hepatic steatosis are present. She is reassured that the pancreas appears normal.  She is engaged in her care but has emotional distress about possible disease progression and is interested in liver-directed options, including microwave ablation and Y90 radioembolization.  Aug 07, 2024: Follow-up visit for metastatic breast cancer with liver and lung involvement. Recent imaging showed increase in size of one liver lesion (from 1.7 cm to 2.2 cm), while other liver and lung lesions remained stable. Patient ambulatory with ECOG 1, managing chemotherapy-related toxicities; plan to increase Capecitabine  dose and repeat CT scan in December. Aug 08, 2024: Follow-up visit to review liver lesions; patient on Trastuzumab , Tucatinib , and Capecitabine  since December 2024. Imaging reviewed, showing progression of one liver lesion and stability of others; symptomatic but ambulatory, chemotherapy toxicities managed with dose adjustments.     ALLERGIES:  has no known allergies.  MEDICATIONS:  Current Outpatient Medications  Medication Sig Dispense Refill   acetaminophen  (TYLENOL ) 500 MG tablet Take 500 mg by mouth as needed.     capecitabine  (XELODA ) 500 MG tablet Take 2 tablets (1000 mg) in AM and 1 tablets (500 mg) in PM by mouth every 12 hours. Take for 14 days on, 7 days off. Repeat every 21 days. 42 tablet 0   diphenoxylate -atropine  (LOMOTIL ) 2.5-0.025 MG tablet Take 1 tablet by  mouth 4 (four) times daily as needed for diarrhea or loose stools. 30 tablet 3   Famotidine -Ca Carb-Mag Hydrox (PEPCID  COMPLETE PO) Take by mouth as needed.     heparin  10,000 Units in sodium chloride  0.9 % 18 mL by CRRT route continuous. Every 21 days      lidocaine -prilocaine  (EMLA ) cream Apply to affected area once 30 g 3   tucatinib  (TUKYSA ) 150 MG tablet Take 2 tablets (300 mg total) by mouth 2 (two) times daily. Take every 12 hrs at the same time each day with or without a meal. 120 tablet 3   No current facility-administered medications for this visit.    PHYSICAL EXAMINATION: ECOG PERFORMANCE STATUS: 1 - Symptomatic but completely ambulatory  Vitals:   10/08/24 1000  BP: 118/78  Pulse: 81  Resp: 18  Temp: 97.6 F (36.4 C)  SpO2: 99%   Filed Weights   10/08/24 1000  Weight: 130 lb 11.2 oz (59.3 kg)    Physical Exam   (exam performed in the presence of a chaperone)  LABORATORY DATA:  I have reviewed the data as listed    Latest Ref Rng & Units 10/04/2024    7:46 AM 07/24/2024    9:47 AM 05/22/2024   11:19 AM  CMP  Glucose 70 - 99 mg/dL 92  91  98   BUN 8 - 23 mg/dL 14  10  12    Creatinine 0.44 - 1.00 mg/dL 9.11  9.14  9.27   Sodium 135 - 145 mmol/L 139  139  137   Potassium 3.5 - 5.1 mmol/L 3.8  3.6  3.9   Chloride 98 - 111 mmol/L 105  108  106   CO2 22 - 32 mmol/L 24  28  26    Calcium 8.9 - 10.3 mg/dL 9.1  9.2  9.0   Total Protein 6.5 - 8.1 g/dL 6.8  6.5  6.6   Total Bilirubin 0.0 - 1.2 mg/dL 0.5  0.5  0.5   Alkaline Phos 38 - 126 U/L 110  94  115   AST 15 - 41 U/L 27  18  33   ALT 0 - 44 U/L 22  17  33     Lab Results  Component Value Date   WBC 5.7 10/04/2024   HGB 12.2 10/04/2024   HCT 36.2 10/04/2024   MCV 95.5 10/04/2024   PLT 204 10/04/2024   NEUTROABS 2.7 10/04/2024    ASSESSMENT & PLAN:  Malignant neoplasm of central portion of right breast in female, estrogen receptor negative (HCC) 06/18/2021:Right lumpectomy: Grade 3 IDC 1.5 cm, high-grade DCIS, margins negative, 0/2 lymph nodes negative, ER 0%, PR 0%, HER2 3+ positive, Ki-67 30%    (2013 Right breast cancer: Stage Ia ER/PR positive HER2 negative status postlumpectomy, radiation (low risk Oncotype) could not tolerate tamoxifen  for more than 30  days.)   Treatment plan: 1.  Adjuvant chemotherapy with Taxol  and Herceptin  followed by Herceptin  maintenance completed 07/06/2022 2. breast radiation completed 01/04/2022 3.  Metastatic disease diagnosis:CT chest on 11/01/2022: Right upper lobe nodules largest measuring 5 mm, 01/13/23: Liver Biopsy: Met breast cancer ER 0%, PR 0%, Ki67 50%, HER2 3+  4.  Enhertu  02/07/2023-09/13/2023 5.  Herceptin  Tucatinib  Xeloda  December 2024-December 2025 ---------------------------------------------------------------------------------------------------------------------   CT CAP 01/15/2024: Liver lesions diminished in size (1.6 cm was 3.4 cm, 1.6 cm was 2.3 cm), diminished size of the right lower lobe lung nodule 0.6 cm was 0.9 cm CT CAP 04/04/2024: Stable hepatic metastases MRI Brain 05/29/24:  No evidence of recurrent tumor in Rt post temporal lobe CT CAP 08/05/24: Increased size of liver met 2.2 cm (was 1.7 cm), Stable 10 mm and 1.4 cm Left lobe of liver mets, 5 mm lung nodule stable CT CAP 10/05/2024: Right lower lobe nodules slightly enlarged, increase in subcapsular right hepatic lobe 2.4 x 3 cm (was 1.8 x 2.2 cm), left hepatic lobe: Stable     echocardiograms will be done every 6 months from this point onwards.   Discussion regarding increasing liver metastasis:   Plan:  Option to participate in clinical trials Liver directed therapy: I also refer the patient to interventional radiology.  Systemic options include Taxotere Herceptin  Perjeta vs Margetuximab  No orders of the defined types were placed in this encounter.  The patient has a good understanding of the overall plan. she agrees with it. she will call with any problems that may develop before the next visit here.  I personally spent a total of 45 minutes in the care of the patient today including preparing to see the patient, getting/reviewing separately obtained history, performing a medically appropriate exam/evaluation, counseling and  educating, placing orders, referring and communicating with other health care professionals, documenting clinical information in the EHR, independently interpreting results, communicating results, and coordinating care.   Viinay K Ason Heslin, MD 10/08/2024    "

## 2024-10-09 ENCOUNTER — Other Ambulatory Visit: Payer: Self-pay

## 2024-10-09 ENCOUNTER — Ambulatory Visit (HOSPITAL_COMMUNITY)
Admission: RE | Admit: 2024-10-09 | Discharge: 2024-10-09 | Disposition: A | Discharge status: Home / Self Care | Source: Ambulatory Visit | Attending: Hematology and Oncology | Admitting: Hematology and Oncology

## 2024-10-09 ENCOUNTER — Other Ambulatory Visit: Payer: Self-pay | Admitting: Hematology and Oncology

## 2024-10-09 DIAGNOSIS — Z5181 Encounter for therapeutic drug level monitoring: Secondary | ICD-10-CM | POA: Diagnosis not present

## 2024-10-09 DIAGNOSIS — Z09 Encounter for follow-up examination after completed treatment for conditions other than malignant neoplasm: Secondary | ICD-10-CM | POA: Diagnosis present

## 2024-10-09 DIAGNOSIS — C50111 Malignant neoplasm of central portion of right female breast: Secondary | ICD-10-CM

## 2024-10-09 DIAGNOSIS — I071 Rheumatic tricuspid insufficiency: Secondary | ICD-10-CM | POA: Diagnosis not present

## 2024-10-09 DIAGNOSIS — Z79899 Other long term (current) drug therapy: Secondary | ICD-10-CM

## 2024-10-09 LAB — ECHOCARDIOGRAM COMPLETE
AR max vel: 1.71 cm2
AV Area VTI: 1.82 cm2
AV Area mean vel: 1.57 cm2
AV Mean grad: 4 mmHg
AV Peak grad: 6.3 mmHg
Ao pk vel: 1.25 m/s
Area-P 1/2: 3 cm2
S' Lateral: 3 cm

## 2024-10-09 MED ORDER — CAPECITABINE 500 MG PO TABS
ORAL_TABLET | ORAL | 0 refills | Status: DC
  Filled 2024-10-09: qty 42, fill #0
  Filled 2024-10-10: qty 42, 21d supply, fill #0

## 2024-10-10 ENCOUNTER — Other Ambulatory Visit: Payer: Self-pay

## 2024-10-12 ENCOUNTER — Telehealth: Payer: Self-pay

## 2024-10-12 ENCOUNTER — Other Ambulatory Visit: Payer: Self-pay

## 2024-10-12 ENCOUNTER — Other Ambulatory Visit (HOSPITAL_COMMUNITY): Payer: Self-pay

## 2024-10-12 NOTE — Progress Notes (Signed)
 Specialty Pharmacy Refill Coordination Note  Lori Mcmillan is a 63 y.o. female contacted today regarding refills of specialty medication(s) Capecitabine  (XELODA )   Patient requested [name] at [facility] at [place] date: 10/16/24   Medication will be filled on: 10/15/24

## 2024-10-12 NOTE — Telephone Encounter (Signed)
 Pt called w/ concern about Xeloda . Reports that during last visit w/ Dr. Odean they decided to adjust dosage to 2 pills in the am and 2 pills in the pm, however RX is still for 2 in am and 1 in pm. RN spoke w/ pharmacist Norleen Parkinson who advised that since the adjustment isn't stated in Dr. Gara last visit note we keep the RX as it is until Dr. Odean returns to the office on 1/5 at which point pt can call back to adjust the RX. RN informed pt who verbalized understanding.

## 2024-10-15 ENCOUNTER — Other Ambulatory Visit: Payer: Self-pay

## 2024-10-16 ENCOUNTER — Inpatient Hospital Stay

## 2024-10-16 ENCOUNTER — Other Ambulatory Visit: Payer: Self-pay

## 2024-10-16 ENCOUNTER — Encounter: Payer: Self-pay | Admitting: Adult Health

## 2024-10-16 ENCOUNTER — Inpatient Hospital Stay (HOSPITAL_BASED_OUTPATIENT_CLINIC_OR_DEPARTMENT_OTHER): Admitting: Adult Health

## 2024-10-16 VITALS — BP 117/69 | HR 84 | Temp 98.2°F | Resp 18 | Wt 132.5 lb

## 2024-10-16 VITALS — BP 114/67 | HR 72 | Temp 97.4°F | Resp 18 | Ht 64.0 in | Wt 134.5 lb

## 2024-10-16 DIAGNOSIS — C50111 Malignant neoplasm of central portion of right female breast: Secondary | ICD-10-CM

## 2024-10-16 DIAGNOSIS — Z5112 Encounter for antineoplastic immunotherapy: Secondary | ICD-10-CM | POA: Diagnosis not present

## 2024-10-16 DIAGNOSIS — R21 Rash and other nonspecific skin eruption: Secondary | ICD-10-CM

## 2024-10-16 MED ORDER — TRIAMCINOLONE ACETONIDE 0.5 % EX OINT: 1.0000 | TOPICAL_OINTMENT | Freq: Two times a day (BID) | CUTANEOUS | 0 refills | Status: AC

## 2024-10-16 MED ORDER — ACETAMINOPHEN 325 MG PO TABS
650.0000 mg | ORAL_TABLET | Freq: Once | ORAL | Status: AC
  Administered 2024-10-16: 650 mg via ORAL
  Filled 2024-10-16: qty 2

## 2024-10-16 MED ORDER — TRASTUZUMAB CHEMO 150 MG IV SOLR
6.0000 mg/kg | Freq: Once | INTRAVENOUS | Status: AC
  Administered 2024-10-16: 378 mg via INTRAVENOUS
  Filled 2024-10-16: qty 18

## 2024-10-16 MED ORDER — DIPHENHYDRAMINE HCL 25 MG PO CAPS
25.0000 mg | ORAL_CAPSULE | Freq: Once | ORAL | Status: AC
  Administered 2024-10-16: 25 mg via ORAL
  Filled 2024-10-16: qty 1

## 2024-10-16 MED ORDER — SODIUM CHLORIDE 0.9 % IV SOLN: INTRAVENOUS | Status: DC

## 2024-10-16 NOTE — Patient Instructions (Signed)
 [facility] - A DEPT OF [facility]  Discharge Instructions: Thank you for choosing [facility] to provide your oncology and hematology care.   If you have a lab appointment with the Cancer Center, please go directly to the Cancer Center and check in at the registration area.   Wear comfortable clothing and clothing appropriate for easy access to any Portacath or PICC line.   We strive to give you quality time with your provider. You may need to reschedule your appointment if you arrive late (15 or more minutes).  Arriving late affects you and other patients whose appointments are after yours.  Also, if you miss three or more appointments without notifying the office, you may be dismissed from the clinic at the provider's discretion.      For prescription refill requests, have your pharmacy contact our office and allow 72 hours for refills to be completed.    Today you received the following chemotherapy and/or immunotherapy agents: trastuzumab  (HERCEPTIN )      To help prevent nausea and vomiting after your treatment, we encourage you to take your nausea medication as directed.  BELOW ARE SYMPTOMS THAT SHOULD BE REPORTED IMMEDIATELY: *FEVER GREATER THAN 100.4 F (38 C) OR HIGHER *CHILLS OR SWEATING *NAUSEA AND VOMITING THAT IS NOT CONTROLLED WITH YOUR NAUSEA MEDICATION *UNUSUAL SHORTNESS OF BREATH *UNUSUAL BRUISING OR BLEEDING *URINARY PROBLEMS (pain or burning when urinating, or frequent urination) *BOWEL PROBLEMS (unusual diarrhea, constipation, pain near the anus) TENDERNESS IN MOUTH AND THROAT WITH OR WITHOUT PRESENCE OF ULCERS (sore throat, sores in mouth, or a toothache) UNUSUAL RASH, SWELLING OR PAIN  UNUSUAL VAGINAL DISCHARGE OR ITCHING   Items with * indicate a potential emergency and should be followed up as soon as possible or go to the Emergency Department if any problems should occur.  Please show the CHEMOTHERAPY ALERT CARD or  IMMUNOTHERAPY ALERT CARD at check-in to the Emergency Department and triage nurse.  Should you have questions after your visit or need to cancel or reschedule your appointment, please contact [facility] - A DEPT OF [facility]  Dept: [phone]  and follow the prompts.  Office hours are 8:00 a.m. to 4:30 p.m. Monday - Friday. Please note that voicemails left after 4:00 p.m. may not be returned until the following business day.  We are closed weekends and major holidays. You have access to a nurse at all times for urgent questions. Please call the main number to the clinic Dept: [phone] and follow the prompts.   For any non-urgent questions, you may also contact your provider using MyChart. We now offer e-Visits for anyone 56 and older to request care online for non-urgent symptoms. For details visit [email or web address].   Also download the MyChart app! Go to the app store, search MyChart, open the app, select [facility], and log in with your MyChart username and password.

## 2024-10-19 ENCOUNTER — Ambulatory Visit: Payer: Self-pay

## 2024-10-19 ENCOUNTER — Encounter: Payer: Self-pay | Admitting: Adult Health

## 2024-10-19 ENCOUNTER — Encounter: Payer: Self-pay | Admitting: Hematology and Oncology

## 2024-10-19 NOTE — Progress Notes (Signed)
 [facility] Cancer Follow up:    Lori Bring, DO [address]  [address] [place] [address]   DIAGNOSIS: Cancer Staging  Malignant neoplasm of central portion of right breast in female, estrogen receptor negative (HCC) Staging form: Breast, AJCC 6th Edition - Pathologic stage from 01/04/2012: Stage I (T1c, N0, M0) - Signed by Crawford Morna Pickle, NP on 10/05/2022 Histologic grade (G): G1 Staging form: Breast, AJCC 8th Edition - Pathologic stage from 06/18/2021: Stage IA (pT1c, pN0, cM0, G3, ER-, PR-, HER2+) - Signed by Crawford Morna Pickle, NP on 10/05/2022 Stage prefix: Initial diagnosis Histologic grading system: 3 grade system - Pathologic stage from 01/13/2023: Stage IV (pM1, G3, ER-, PR-, HER2+) - Signed by Crawford Morna Pickle, NP on 02/07/2023 Histologic grading system: 3 grade system    SUMMARY OF ONCOLOGIC HISTORY: Oncology History  Malignant neoplasm of central portion of right breast in female, estrogen receptor negative (HCC)  01/04/2012 Initial Diagnosis   Right breast cancer: Stage Ia ER/PR positive HER2 negative status postlumpectomy, radiation (low risk Oncotype) could not tolerate tamoxifen  for more than 30 days.   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  Biopsy revealed grade 3 IDC ER/PR negative HER2 positive with a Ki-67 of 30%   06/18/2021 Surgery   Right lumpectomy: Grade 3 IDC 1.5 cm, high-grade DCIS, margins negative, 0/2 lymph nodes negative, ER 0%, PR 0%, HER2 3+ positive, Ki-67 30%     Genetic Testing   Negative genetic testing. No pathogenic variants identified on the [facility] Multi-Cancer Panel+RNA. VUS in POLD1 identified called c.2429C>T. The report date is 07/01/2021.  The Multi-Cancer Panel + RNA offered by [facility] includes sequencing and/or deletion duplication testing of the following 84 genes: AIP, ALK, APC, ATM, AXIN2,BAP1,  BARD1, BLM, BMPR1A, BRCA1, BRCA2, BRIP1, CASR, CDC73, CDH1, CDK4, CDKN1B, CDKN1C, CDKN2A (p14ARF), CDKN2A (p16INK4a), CEBPA, CHEK2, CTNNA1, DICER1, DIS3L2, EGFR (c.2369C>T, p.Thr790Met variant only), EPCAM (Deletion/duplication testing only), FH, FLCN, GATA2, GPC3, GREM1 (Promoter region deletion/duplication testing only), HOXB13 (c.251G>A, p.Gly84Glu), HRAS, KIT, MAX, MEN1, MET, MITF (c.952G>A, p.Glu318Lys variant only), MLH1, MSH2, MSH3, MSH6, MUTYH, NBN, NF1, NF2, NTHL1, PALB2, PDGFRA, PHOX2B, PMS2, POLD1, POLE, POT1, PRKAR1A, PTCH1, PTEN, RAD50, RAD51C, RAD51D, RB1, RECQL4, RET, RUNX1, SDHAF2, SDHA (sequence changes only), SDHB, SDHC, SDHD, SMAD4, SMARCA4, SMARCB1, SMARCE1, STK11, SUFU, TERC, TERT, TMEM127, TP53, TSC1, TSC2, VHL, WRN and WT1.   06/18/2021 Cancer Staging   Staging form: Breast, AJCC 8th Edition - Pathologic stage from 06/18/2021: Stage IA (pT1c, pN0, cM0, G3, ER-, PR-, HER2+) - Signed by Crawford Morna Pickle, NP on 10/05/2022 Stage prefix: Initial diagnosis Histologic grading system: 3 grade system   07/21/2021 - 07/06/2022 Chemotherapy   Patient is on Treatment Plan : BREAST Paclitaxel  + Trastuzumab  q7d / Trastuzumab  q21d     11/25/2021 - 01/04/2022 Radiation Therapy   Site Technique Total Dose (Gy) Dose per Fx (Gy) Completed Fx Beam Energies  Breast, Right: Breast_R 3D 45/45 1.8 25/25 6XFFF  Breast, Right: Breast_R_Bst specialPort 5.4/5.4 1.8 3/3 12E     12/29/2022 Progression  CT chest on 11/01/2022: Right upper lobe nodules largest measuring 5 mm CT chest 12/29/2022: Upper and mid lung zone predominant pulmonary nodules are  worrisome for metastatic disease.  Left and right hepatic lobe masses (2.5 cm and 3.9 cm) are new and are worrisome for metastatic disease.   01/13/2023 Initial Biopsy   Right liver biopsy: Metastatic carcinoma with breast primary, ER-, PR-, HER2 +, Ki-67 50%.    01/13/2023 Cancer Staging   Staging form: Breast, AJCC 8th Edition - Pathologic stage from 01/13/2023: Stage IV (pM1, G3, ER-, PR-, HER2+) - Signed by Crawford Morna Pickle, NP on 02/07/2023 Histologic grading system: 3 grade system   01/17/2023 PET scan   PET CT scan 01/17/2023: Hypermetabolic bilobar hepatic (2 masses measuring 2.8 cm SUV 11.1, another mass 4.1 cm SUV 10.3) and pulmonary metastases (right upper lobe lung nodule 5 mm SUV 2.6 right lower lobe 6 mm nodule SUV 1.8)    02/07/2023 - 09/13/2023 Chemotherapy   Patient is on Treatment Plan : BREAST METASTATIC Fam-Trastuzumab Deruxtecan-nxki  (Enhertu ) (5.4) q21d     10/04/2023 -  Chemotherapy   Patient is on Treatment Plan : BREAST Trastuzumab  (8/6) IV D1 + Capecitabine  +  Tucatinib  q21d       CURRENT THERAPY: Trastuzumab  capecitabine  and Tucatinib   INTERVAL HISTORY:  Discussed the use of AI scribe software for clinical note transcription with the patient, who gave verbal consent to proceed.  History of Present Illness Lori Mcmillan is a 64 year old female with metastatic breast cancer on Xeloda  and Herceptin  who presents with a new burning facial rash with blisters.  She developed a burning rash with blisters starting as a small upper lip blister that ruptured with clear fluid, this worsened in her chin area. The rash has worsened over the past 48 hours, was most inflamed last night, and feels burning rather than pruritic. She denies similar lesions elsewhere or towards her eyes, including ocular involvement. She has applied a toner with acacia honey with some relief which help dry the lesions out.  She recently increased her Xeloda  dose last Tuesday to four tablets  per day and is now off until Friday. She noticed the rash became more inflamed after this dose increase and toward the end of her last cycle, which finished Thursday evening. She continues Herceptin  and is concerned whether the rash will affect her treatment. She is cautious about touching her face and uses antibacterial products after contact. She has completed the two-dose shingles vaccine and does not feel the pain is consistent with herpes zoster. She had recurrent pityriasis rosea in childhood.     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) 02/07/2023   Cancer with pulmonary metastases (HCC) 02/07/2023   Cellulitis of finger of right hand 05/12/2022   Eustachian tube dysfunction, right 08/09/2021   Port-A-Cath in place 07/21/2021   Genetic testing 07/02/2021   Hx of basal cell carcinoma 05/26/2021   Family history of breast cancer 05/26/2021   Family history of ovarian cancer 05/26/2021   Family history of pancreatic cancer 05/26/2021   Family history of colon cancer 05/26/2021   Family history of melanoma 05/26/2021   Breast lump in upper inner quadrant 04/30/2021   Post herpetic neuralgia 01/29/2021   Seizures (HCC) 01/01/2021   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  No sign of residual dz at 04/2018 rad onc f/u and 10/2018 neuro-onc f/u. 03/2019 MRI brain->no resid/no recurrence.   Brain embolism and thrombosis    Breast cancer (HCC)    Rt breast; 1.5 cm low grade invasive ductal carcinoma status  post lumpectomy with sentinel node biopsy on 01/04/2012.   Chicken pox    Depression    zoloft  in past--?wt gain.   Diplopia    chronic (meningioma-related)   Eustachian tube dysfunction 09/05/2013   Family history of breast cancer    Family history of colon cancer    Family history of melanoma    Family history of ovarian cancer    Family history of pancreatic cancer    GERD (gastroesophageal reflux disease)    History of radiation therapy 02/24/12-04/11/12   right breast/ 45Gy@1 .8Gyx2fx/boost=16Gy@2  Gya63fx.  Latest mammo and u/s 03/2013--normal.   History of radiation therapy 11/30/17- 01/11/18   Right temporal lobe treated to 55.8 Gy with 31 fx of 1.8 Gy   Hx of basal cell carcinoma    Migraine    Palpitations    has taken Metoprolol for palpitations in the past.   Seizure (HCC)    2 YEARS AGO,UPDATED 09/07/22   Taste impairment    s/p radiation therapy   Tobacco dependence 09/05/2013    SURGICAL HISTORY: Past Surgical History:  Procedure Laterality Date   APPENDECTOMY  2008   emergency   BRAIN TUMOR EXCISION  2018   Meningioma   BREAST LUMPECTOMY Right 01/04/2012   right breast   BREAST LUMPECTOMY WITH RADIOACTIVE SEED AND SENTINEL LYMPH NODE BIOPSY Right 06/18/2021   Procedure: RIGHT BREAST LUMPECTOMY WITH RADIOACTIVE SEED AND SENTINEL LYMPH NODE BIOPSY;  Surgeon: Vanderbilt Ned, MD;  Location: [facility];  Service: General;  Laterality: Right;   COLONOSCOPY     2014 ?   CRANIECTOMY  10/19/2017   at [facility] hospital   IR IMAGING GUIDED PORT INSERTION  02/02/2023   PORTACATH PLACEMENT Right 06/18/2021   Procedure: INSERTION PORT-A-CATH;  Surgeon: Vanderbilt Ned, MD;  Location: [facility];  Service: General;  Laterality: Right;   PORTACATH REMOVED     OCTOBER 10,2023   WISDOM TOOTH EXTRACTION  1990    SOCIAL HISTORY: Social History   Socioeconomic History   Marital status: Single    Spouse name: Not on file   Number of children: Not on  file   Years of education: Not on file   Highest education level: Bachelor's degree (e.g., BA, AB, BS)  Occupational History   Not on file  Tobacco Use   Smoking status: Former    Current packs/day: 0.00    Average packs/day: 0.5 packs/day    Types: Cigarettes    Quit date: 10/17/2017    Years since quitting: 7.0    Passive exposure: Never   Smokeless tobacco: Never  Vaping Use   Vaping status: Former  Substance and Sexual Activity   Alcohol use: Yes    Alcohol/week: 3.0 standard drinks of alcohol    Types: 3 Cans of beer per week    Comment: 3-4 beers/day   Drug use: Not Currently  Sexual activity: Not Currently    Partners: Male  Other Topics Concern   Not on file  Social History Narrative   Marital status/children/pets: Single.  No children.  Lives alone.  Has pets.Orig from [place] [employer] in [place].   Education/employment: Oncologist, retired   Field Seismologist:      -smoke alarm in the home:Yes     - wears seatbelt: Yes      Enjoys hiking, reading and cooking.          Social Drivers of Health   Tobacco Use: Medium Risk (10/16/2024)   Patient History    Smoking Tobacco Use: Former    Smokeless Tobacco Use: Never    Passive Exposure: Never  Physicist, Medical Strain: Medium Risk (07/27/2024)   Overall Financial Resource Strain (CARDIA)    Difficulty of Paying Living Expenses: Somewhat hard  Food Insecurity: Food Insecurity Present (07/27/2024)   Epic    Worried About Programme Researcher, Broadcasting/film/video in the Last Year: Sometimes true    Ran Out of Food in the Last Year: Sometimes true  Transportation Needs: No Transportation Needs (07/27/2024)   Epic    Lack of Transportation (Medical): No    Lack of Transportation (Non-Medical): No  Physical Activity: Insufficiently Active (07/27/2024)   Exercise Vital Sign    Days of Exercise per Week: 3 days    Minutes of Exercise per Session: 20 min  Stress: Stress Concern Present (07/27/2024)   [employer] of Occupational Health -  Occupational Stress Questionnaire    Feeling of Stress: To some extent  Social Connections: Socially Isolated (07/27/2024)   Social Connection and Isolation Panel    Frequency of Communication with Friends and Family: More than three times a week    Frequency of Social Gatherings with Friends and Family: Once a week    Attends Religious Services: Patient declined    Active Member of Clubs or Organizations: No    Attends Engineer, Structural: Not on file    Marital Status: Never married  Intimate Partner Violence: Not At Risk (05/10/2024)   Epic    Fear of Current or Ex-Partner: No    Emotionally Abused: No    Physically Abused: No    Sexually Abused: No  Depression (PHQ2-9): Low Risk (10/16/2024)   Depression (PHQ2-9)    PHQ-2 Score: 0  Recent Concern: Depression (PHQ2-9) - Medium Risk (07/27/2024)   Depression (PHQ2-9)    PHQ-2 Score: 6  Alcohol Screen: Low Risk (05/10/2024)   Alcohol Screen    Last Alcohol Screening Score (AUDIT): 3  Housing: Unknown (07/27/2024)   Epic    Unable to Pay for Housing in the Last Year: Not on file    Number of Times Moved in the Last Year: 0    Homeless in the Last Year: No  Utilities: Not At Risk (05/10/2024)   Epic    Threatened with loss of utilities: No  Health Literacy: Adequate Health Literacy (05/10/2024)   B1300 Health Literacy    Frequency of need for help with medical instructions: Never    FAMILY HISTORY: Family History  Problem Relation Age of Onset   Anesthesia problems Mother    Breast cancer Mother 75       lumpectomy, chemo, radiation   Cancer Mother    Colon polyps Brother    Ovarian cancer Maternal Aunt 24   Cancer Maternal Aunt    Cancer Maternal Aunt        unknown type   Breast cancer  Maternal Aunt 80   Breast cancer Maternal Aunt 40       bilateral   Pancreatic cancer Maternal Uncle        dx 20s   Stomach cancer Paternal Uncle    Cancer Paternal Uncle        unknown type   Lung cancer Paternal Uncle         hx smoking   Dementia Maternal Grandmother    Lung cancer Maternal Grandfather        hx smoking   Pancreatic cancer Paternal Grandmother 34   Melanoma Paternal Grandmother        dx >50, shin   Cancer Paternal Grandmother    Heart Problems Paternal Grandfather    Colon cancer Cousin 6       maternal first cousin   Breast cancer Cousin        dx 64s, paternal first cousin   Cancer Maternal Aunt    Crohn's disease Neg Hx    Esophageal cancer Neg Hx    Rectal cancer Neg Hx    Ulcerative colitis Neg Hx     Review of Systems  Constitutional:  Negative for appetite change, chills, fatigue, fever and unexpected weight change.  HENT:   Negative for hearing loss, lump/mass and trouble swallowing.   Eyes:  Negative for eye problems and icterus.  Respiratory:  Negative for chest tightness, cough and shortness of breath.   Cardiovascular:  Negative for chest pain, leg swelling and palpitations.  Gastrointestinal:  Negative for abdominal distention, abdominal pain, constipation, diarrhea, nausea and vomiting.  Endocrine: Negative for hot flashes.  Genitourinary:  Negative for difficulty urinating.   Musculoskeletal:  Negative for arthralgias.  Skin:  Positive for rash. Negative for itching.  Neurological:  Negative for dizziness, extremity weakness, headaches and numbness.  Hematological:  Negative for adenopathy. Does not bruise/bleed easily.  Psychiatric/Behavioral:  Negative for depression. The patient is not nervous/anxious.       PHYSICAL EXAMINATION    Vitals:   10/16/24 1210  BP: 114/67  Pulse: 72  Resp: 18  Temp: (!) 97.4 F (36.3 C)  SpO2: 98%    Physical Exam Constitutional:      General: She is not in acute distress. HENT:     Head: Normocephalic.  Eyes:     General: No scleral icterus.       Right eye: No discharge.        Left eye: No discharge.     Conjunctiva/sclera: Conjunctivae normal.  Skin:    Comments: See picture below  Neurological:      General: No focal deficit present.     Mental Status: She is alert.  Psychiatric:        Mood and Affect: Mood normal.        Behavior: Behavior normal.      ASSESSMENT and THERAPY PLAN:    Assessment and Plan Assessment & Plan Facial rash, possible herpes zoster Facial rash with burning and vesicles (per report of their appearance last night), localized to V3 dermatome, without ocular involvement. Differential includes herpes zoster and drug-induced eruption from Xeloda  dose escalation. Prior shingles vaccination reduces but does not eliminate herpes zoster risk. Rash worsened after Xeloda  dose increase, suggesting drug-related etiology. - Obtained viral swab for laboratory confirmation of herpes zoster or other viral etiologies. - Prescribed topical corticosteroid ointment to reduce inflammation and burning. - Instructed to resume Xeloda  at previous lower dose (2 and 1) after current drug holiday. - Advised  to monitor for rash extension toward the eye and contact office immediately if this occurs for urgent ophthalmologic evaluation. - Deferred oral antiviral therapy pending viral swab results and further clinical evolution.       All questions were answered. The patient knows to call the clinic with any problems, questions or concerns. We can certainly see the patient much sooner if necessary.  Total encounter time:20 minutes*in face-to-face visit time, chart review, lab review, care coordination, order entry, and documentation of the encounter time.    Morna Kendall, NP 10/19/2024 1:25 PM Medical Oncology and Hematology [facility] [address] [place], [place] [address] Tel. [phone]    Fax. [phone]  *Total Encounter Time as defined by the Centers for Medicare and Medicaid Services includes, in addition to the face-to-face time of a patient visit (documented in the note above) non-face-to-face time: obtaining and reviewing outside history,  ordering and reviewing medications, tests or procedures, care coordination (communications with other health care professionals or caregivers) and documentation in the medical record.

## 2024-10-19 NOTE — Telephone Encounter (Signed)
 FYI Only or Action Required?: FYI only for provider: appointment scheduled on Virtual UC on 10/20/24.  Patient was last seen in primary care on 07/27/2024 by Willo Mini, NP.  Called Nurse Triage reporting Rash.  Symptoms began yesterday.  Interventions attempted: Prescription medications: Triamcinolone  Acetonide 0.5 %..  Symptoms are: gradually worsening.  Triage Disposition: See Physician Within 24 Hours  Patient/caregiver understands and will follow disposition?: Yes   12/26 new rash on face around mouth. Saw oncology provider on 12/30 for facial rash. Negative for virus. Prescribed Triamcinolone  Acetonide 0.5 %. Original rash has improved but now with new blotchy red rash on left and right cheeks, left one with streaking last night. Blanches. No fever. No pain or itching. No appts in next 24 hours in pt region. Virtual UC visit scheduled for tomorrow. Pt states she will call her oncology office to see if anything else can be done and would cancel the VV tomorrow if needed. Advised UC or ED for worsening symptoms.     Copied from CRM #[id number]. Topic: Clinical - Red Word Triage >> Oct 19, 2024 12:44 PM [name] wrote: Red Word that prompted transfer to Nurse Triage: Pt stated she maybe having an allergic reaction to triamcinolone  ointment (KENALOG ) 0.5 %. The swollening went down but now has red streaks across her face. Reason for Disposition  [1] Looks infected (spreading redness, red streak, pus) AND [2] no fever  Answer Assessment - Initial Assessment Questions 1. MAIN CONCERN OR SYMPTOM:  What's the main symptom you're worried about? (e.g., itching, rash, redness). If there is a rash, ask: What does the rash look like? (e.g., blisters, dry flaky skin, red spots, redness, sores)      Blotchy red rash on left and right cheeks, let one with streaking  2. ONSET: When did the  blotchy red rash on left and right cheeks, let one with streaking  start?     Last night  3.  LOCATION: Where is this located?      Both cheeks  4. NEW-BETTER-SAME-WORSE: Is this new since your last doctor (or NP/PA) visit? If not new, Are you getting better, staying the same, or getting worse compared to how you felt at your last visit to the doctor?     Worse  5. PAIN: Is there any pain? If Yes, ask: How bad is it?  (Scale 1-10; or mild, moderate, severe)     Denies  6. ITCHING: Is there any itching? If Yes, ask: How bad is it?  (Scale 1-10; or mild, moderate, severe)      Denies  7. FEVER: Do you have a fever? If Yes, ask: What is it, how was it measured and when did it start?     Denies  8. CAUSE: What do you think is causing the rash?     Triamcinolone  Acetonide 0.5 %.  9. NEW MEDICINE: What new medicine are you taking? (e.g., name of antibiotic) When did you start taking this medication?     Triamcinolone  Acetonide 0.5 %.  10. CANCER: What type of cancer do you have?        Metastatic breast cancer that has spread to liver and lungs  11. CANCER - TREATMENT: What cancer treatments have you received? When did you last receive them? (e.g., chemo, immunotherapy, radiation, bone marrow transplant, or recent surgery). Note: Triager should review medical record if available.       Chemo, last infusion on Tuesday. Also taking oral med daily (Xeloda  and Tukysa )  12. CANCER - NEUTROPENIA RISK: Were you told that your white cell count is low? Have you received anti-cancer therapy (e.g., chemo, CAR-T) recently? If Yes, triager with access to patient's medical record should review most recent labs. An ANC less than 1,500 means that the neutrophils are low and the immune system is weak.       WBC and platelets normal  13. OTHER SYMPTOMS: Do you have any other symptoms? (e.g., face swelling, fever, joint pain, sore throat)       Throat is a little sore from the dry air.  Protocols used: Cancer - Skin Symptoms and Questions-A-AH

## 2024-10-20 ENCOUNTER — Telehealth

## 2024-10-24 LAB — VIRUS CULTURE

## 2024-10-30 ENCOUNTER — Other Ambulatory Visit: Payer: Self-pay

## 2024-10-30 ENCOUNTER — Inpatient Hospital Stay: Attending: Hematology and Oncology | Admitting: Hematology and Oncology

## 2024-10-30 VITALS — BP 108/67 | HR 84 | Temp 97.7°F | Resp 16 | Wt 132.2 lb

## 2024-10-30 DIAGNOSIS — Z923 Personal history of irradiation: Secondary | ICD-10-CM | POA: Diagnosis not present

## 2024-10-30 DIAGNOSIS — Z9221 Personal history of antineoplastic chemotherapy: Secondary | ICD-10-CM | POA: Diagnosis not present

## 2024-10-30 DIAGNOSIS — C50111 Malignant neoplasm of central portion of right female breast: Secondary | ICD-10-CM | POA: Diagnosis not present

## 2024-10-30 DIAGNOSIS — Z1722 Progesterone receptor negative status: Secondary | ICD-10-CM | POA: Diagnosis not present

## 2024-10-30 DIAGNOSIS — Z171 Estrogen receptor negative status [ER-]: Secondary | ICD-10-CM | POA: Diagnosis not present

## 2024-10-30 DIAGNOSIS — L271 Localized skin eruption due to drugs and medicaments taken internally: Secondary | ICD-10-CM | POA: Insufficient documentation

## 2024-10-30 DIAGNOSIS — C787 Secondary malignant neoplasm of liver and intrahepatic bile duct: Secondary | ICD-10-CM | POA: Insufficient documentation

## 2024-10-30 DIAGNOSIS — C7801 Secondary malignant neoplasm of right lung: Secondary | ICD-10-CM | POA: Insufficient documentation

## 2024-10-30 DIAGNOSIS — F064 Anxiety disorder due to known physiological condition: Secondary | ICD-10-CM | POA: Diagnosis not present

## 2024-10-30 DIAGNOSIS — Z5112 Encounter for antineoplastic immunotherapy: Secondary | ICD-10-CM | POA: Insufficient documentation

## 2024-10-30 DIAGNOSIS — Z1732 Human epidermal growth factor receptor 2 negative status: Secondary | ICD-10-CM | POA: Insufficient documentation

## 2024-10-30 DIAGNOSIS — T451X5A Adverse effect of antineoplastic and immunosuppressive drugs, initial encounter: Secondary | ICD-10-CM | POA: Diagnosis not present

## 2024-10-30 NOTE — Progress Notes (Signed)
 Disenrolled- Xeloda  and Tukysa  discontinued due to side effects per 1.13.26 office visit notes.

## 2024-10-30 NOTE — Assessment & Plan Note (Signed)
 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  Adjuvant chemotherapy with Taxol  and Herceptin  followed by Herceptin  maintenance completed 07/06/2022 2. breast radiation completed 01/04/2022 3.  Metastatic disease diagnosis:CT chest on 11/01/2022: Right upper lobe nodules largest measuring 5 mm, 01/13/23: Liver Biopsy: Met breast cancer ER 0%, PR 0%, Ki67 50%, HER2 3+  4.  Enhertu  02/07/2023-09/13/2023 5.  Herceptin  Tucatinib  Xeloda  December 2024-December 2025 ---------------------------------------------------------------------------------------------------------------------   CT CAP 01/15/2024: Liver lesions diminished in size (1.6 cm was 3.4 cm, 1.6 cm was 2.3 cm), diminished size of the right lower lobe lung nodule 0.6 cm was 0.9 cm CT CAP 04/04/2024: Stable hepatic metastases MRI Brain 05/29/24: No evidence of recurrent tumor in Rt post temporal lobe CT CAP 08/05/24: Increased size of liver met 2.2 cm (was 1.7 cm), Stable 10 mm and 1.4 cm Left lobe of liver mets, 5 mm lung nodule stable CT CAP 10/05/2024: Right lower lobe nodules slightly enlarged, increase in subcapsular right hepatic lobe 2.4 x 3 cm (was 1.8 x 2.2 cm), left hepatic lobe: Stable  Rash on the face: 10/16/2024: Treated with steroids.  Viral swab negative for zoster   Recommendation: Margetuximab with eribulin was discussed in detail including side effects.  We also discussed the potential future treatments like trastuzumab  duocarmazine might become FDA approved for us  to consider changing treatment in the future.

## 2024-10-30 NOTE — Progress Notes (Signed)
 DISCONTINUE ON PATHWAY REGIMEN - Breast     Cycle 1: A cycle is 21 days:     Capecitabine       Tucatinib       Trastuzumab -xxxx    Cycles 2 and beyond: A cycle is every 21 days:     Capecitabine       Tucatinib       Trastuzumab -xxxx   **Always confirm dose/schedule in your pharmacy ordering system**  PRIOR TREATMENT: [id number]: Capecitabine  1,000 mg/m2 BID D1-14 + Trastuzumab  8/6 mg/kg IV D1 + Tucatinib  300 mg BID D1-21 q21 Days  START ON PATHWAY REGIMEN - Breast     A cycle is every 21 days:     Gemcitabine      Margetuximab-cmkb   **Always confirm dose/schedule in your pharmacy ordering system**  Patient Characteristics: Distant Metastases or Locoregional Recurrent Disease - Unresectable, M0 or Locally Advanced Unresectable Disease Progressing after Neoadjuvant and Local Therapies, M0, HER2 Positive, ER Negative, Chemotherapy, Fourth Line and Beyond, Margetuximab +  Chemotherapy Indicated Therapeutic Status: Distant Metastases ER Status: Negative (-) PR Status: Negative (-) HER2 Status: Positive (+) Line of Therapy: Fourth Line and Beyond Therapy Indicated: Margetuximab + Chemotherapy Indicated Intent of Therapy: Non-Curative / Palliative Intent, Discussed with Patient

## 2024-10-30 NOTE — Progress Notes (Signed)
 "  Patient Care Team: Alvia Bring, DO as PCP - General (Family Medicine) Izell Domino, MD as Consulting Physician (Radiation Oncology) Buckley, Arthea POUR, MD as Consulting Physician (Oncology) [facility] ([facility]) Terri Alan PARAS, MD as Referring Physician (Otolaryngology) Odean Potts, MD as Medical Oncologist (Hematology and Oncology) Vanderbilt Ned, MD as Consulting Physician (General Surgery) Elisa Lonni GORMAN DEVONNA (Dermatology)  DIAGNOSIS:  Encounter Diagnosis  Name Primary?   Malignant neoplasm of central portion of right breast in female, estrogen receptor negative (HCC) Yes    SUMMARY OF ONCOLOGIC HISTORY: Oncology History  Malignant neoplasm of central portion of right breast in female, estrogen receptor negative (HCC)  01/04/2012 Initial Diagnosis   Right breast cancer: Stage Ia ER/PR positive HER2 negative status postlumpectomy, radiation (low risk Oncotype) could not tolerate tamoxifen  for more than 30 days.   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 No pathogenic variants identified on the [facility] Multi-Cancer Panel+RNA. VUS in POLD1 identified called c.2429C>T. The report date is 07/01/2021.  The Multi-Cancer Panel + RNA  offered by [facility] includes sequencing and/or deletion duplication testing of the following 84 genes: AIP, ALK, APC, ATM, AXIN2,BAP1,  BARD1, BLM, BMPR1A, BRCA1, BRCA2, BRIP1, CASR, CDC73, CDH1, CDK4, CDKN1B, CDKN1C, CDKN2A (p14ARF), CDKN2A (p16INK4a), CEBPA, CHEK2, CTNNA1, DICER1, DIS3L2, EGFR (c.2369C>T, p.Thr790Met variant only), EPCAM (Deletion/duplication testing only), FH, FLCN, GATA2, GPC3, GREM1 (Promoter region deletion/duplication testing only), HOXB13 (c.251G>A, p.Gly84Glu), HRAS, KIT, MAX, MEN1, MET, MITF (c.952G>A, p.Glu318Lys variant only), MLH1, MSH2, MSH3, MSH6, MUTYH, NBN, NF1, NF2, NTHL1, PALB2, PDGFRA, PHOX2B, PMS2, POLD1, POLE, POT1, PRKAR1A, PTCH1, PTEN, RAD50, RAD51C, RAD51D, RB1, RECQL4, RET, RUNX1, SDHAF2, SDHA (sequence changes only), SDHB, SDHC, SDHD, SMAD4, SMARCA4, SMARCB1, SMARCE1, STK11, SUFU, TERC, TERT, TMEM127, TP53, TSC1, TSC2, VHL, WRN and WT1.   06/18/2021 Cancer Staging   Staging form: Breast, AJCC 8th Edition - Pathologic stage from 06/18/2021: Stage IA (pT1c, pN0, cM0, G3, ER-, PR-, HER2+) - Signed by Crawford Morna Pickle, NP on 10/05/2022 Stage prefix: Initial diagnosis Histologic grading system: 3 grade system   07/21/2021 - 07/06/2022 Chemotherapy   Patient is on Treatment Plan : BREAST Paclitaxel  + Trastuzumab  q7d / Trastuzumab  q21d     11/25/2021 - 01/04/2022 Radiation Therapy   Site Technique Total Dose (Gy) Dose per Fx (Gy) Completed Fx Beam Energies  Breast, Right: Breast_R 3D 45/45 1.8 25/25 6XFFF  Breast, Right: Breast_R_Bst specialPort 5.4/5.4 1.8 3/3 12E     12/29/2022 Progression   CT chest on 11/01/2022: Right upper lobe nodules largest measuring 5 mm CT chest 12/29/2022: Upper and mid lung zone predominant pulmonary nodules are worrisome for metastatic disease.  Left and right hepatic lobe masses (2.5 cm and 3.9 cm) are new and are worrisome for metastatic disease.  01/13/2023 Initial Biopsy   Right liver biopsy: Metastatic carcinoma with breast  primary, ER-, PR-, HER2 +, Ki-67 50%.    01/13/2023 Cancer Staging   Staging form: Breast, AJCC 8th Edition - Pathologic stage from 01/13/2023: Stage IV (pM1, G3, ER-, PR-, HER2+) - Signed by Crawford Morna Pickle, NP on 02/07/2023 Histologic grading system: 3 grade system   01/17/2023 PET scan   PET CT scan 01/17/2023: Hypermetabolic bilobar hepatic (2 masses measuring 2.8 cm SUV 11.1, another mass 4.1 cm SUV 10.3) and pulmonary metastases (right upper lobe lung nodule 5 mm SUV 2.6 right lower lobe 6 mm nodule SUV 1.8)    02/07/2023 - 09/13/2023 Chemotherapy   Patient is on Treatment Plan : BREAST METASTATIC Fam-Trastuzumab Deruxtecan-nxki  (Enhertu ) (5.4) q21d     10/04/2023 - 10/16/2024 Chemotherapy   Patient is on Treatment Plan : BREAST Trastuzumab  (8/6) IV D1 + Capecitabine  +  Tucatinib  q21d     11/06/2024 -  Chemotherapy   Patient is on Treatment Plan : BREAST Margetuximab D1 + Gemcitabine D1,8 q21d       CHIEF COMPLIANT: Discussion regarding the next treatment, rash on the face  HISTORY OF PRESENT ILLNESS: History of Present Illness Lori Mcmillan is a 64 year old female with metastatic HER2-positive, 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  In late December she developed a painful, burning, and tingling rash involving the periorbital area after an upper respiratory illness treated with Tylenol  Severe Cold and Flu. The rash was first noted at her December 30 infusion visit, improved with topical triamcinolone , then recurred and intensified after walking her dog and bathing. It has had intermittent partial improvement and recurrence and she finds it severe and distressing. Viral cultures were negative and she denies new food allergies, with only a recent dietary change of spicy queso blanco.  At the time of rash onset she was on tucatinib  and capecitabine  with  trastuzumab  for metastatic disease. From December 22 until pharmacy clarification she mistakenly took capecitabine  2 tablets twice daily (4/day) instead of the prescribed 2 in the morning and 1 in the evening (3/day). She continued tucatinib  and capecitabine  until January 11, when she stopped all chemotherapy because of the worsening rash. Roflumilast cream from dermatology caused severe burning and hydrocortisone 2.5% cream has given only partial relief. She has tolerated trastuzumab  previously without issues. She has a prior brain tumor resection with radiation. No recent or planned liver-directed procedures are reported.     ALLERGIES:  has no known allergies.  MEDICATIONS:  Current Outpatient Medications  Medication Sig Dispense Refill   acetaminophen  (TYLENOL ) 500 MG tablet Take 500 mg by mouth as needed.     diphenoxylate -atropine  (LOMOTIL ) 2.5-0.025 MG tablet Take 1 tablet by mouth 4 (four) times daily as needed for diarrhea or loose stools. 30 tablet 3   Famotidine -Ca Carb-Mag Hydrox (PEPCID  COMPLETE PO) Take by mouth as needed.     heparin  10,000 Units in sodium chloride  0.9 % 18 mL by CRRT route continuous. Every 21 days     triamcinolone  ointment (KENALOG ) 0.5 % Apply 1 Application topically 2 (two) times daily. 30 g 0   No current facility-administered medications for this visit.    PHYSICAL EXAMINATION: ECOG PERFORMANCE STATUS: 1 - Symptomatic but completely ambulatory  Vitals:   10/30/24 0802  BP: 108/67  Pulse: 84  Resp: 16  Temp: 97.7 F (36.5 C)  SpO2: 95%   Filed  Weights   10/30/24 0802  Weight: 132 lb 3.2 oz (60 kg)    Physical Exam Rash on the face maculopapular in nature   (exam performed in the presence of a chaperone)  LABORATORY DATA:  I have reviewed the data as listed    Latest Ref Rng & Units 10/04/2024    7:46 AM 07/24/2024    9:47 AM 05/22/2024   11:19 AM  CMP  Glucose 70 - 99 mg/dL 92  91  98   BUN 8 - 23 mg/dL 14  10  12    Creatinine 0.44  - 1.00 mg/dL 9.11  9.14  9.27   Sodium 135 - 145 mmol/L 139  139  137   Potassium 3.5 - 5.1 mmol/L 3.8  3.6  3.9   Chloride 98 - 111 mmol/L 105  108  106   CO2 22 - 32 mmol/L 24  28  26    Calcium 8.9 - 10.3 mg/dL 9.1  9.2  9.0   Total Protein 6.5 - 8.1 g/dL 6.8  6.5  6.6   Total Bilirubin 0.0 - 1.2 mg/dL 0.5  0.5  0.5   Alkaline Phos 38 - 126 U/L 110  94  115   AST 15 - 41 U/L 27  18  33   ALT 0 - 44 U/L 22  17  33     Lab Results  Component Value Date   WBC 5.7 10/04/2024   HGB 12.2 10/04/2024   HCT 36.2 10/04/2024   MCV 95.5 10/04/2024   PLT 204 10/04/2024   NEUTROABS 2.7 10/04/2024    ASSESSMENT & PLAN:  Malignant neoplasm of central portion of right breast in female, estrogen receptor negative (HCC) 06/18/2021:Right lumpectomy: 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  Adjuvant chemotherapy with Taxol  and Herceptin  followed by Herceptin  maintenance completed 07/06/2022 2. breast radiation completed 01/04/2022 3.  Metastatic disease diagnosis:CT chest on 11/01/2022: Right upper lobe nodules largest measuring 5 mm, 01/13/23: Liver Biopsy: Met breast cancer ER 0%, PR 0%, Ki67 50%, HER2 3+  4.  Enhertu  02/07/2023-09/13/2023 5.  Herceptin  Tucatinib  Xeloda  December 2024-December 2025 ---------------------------------------------------------------------------------------------------------------------   CT CAP 01/15/2024: Liver lesions diminished in size (1.6 cm was 3.4 cm, 1.6 cm was 2.3 cm), diminished size of the right lower lobe lung nodule 0.6 cm was 0.9 cm CT CAP 04/04/2024: Stable hepatic metastases MRI Brain 05/29/24: No evidence of recurrent tumor in Rt post temporal lobe CT CAP 08/05/24: Increased size of  liver met 2.2 cm (was 1.7 cm), Stable 10 mm and 1.4 cm Left lobe of liver mets, 5 mm lung nodule stable CT CAP 10/05/2024: Right lower lobe nodules slightly enlarged, increase in subcapsular right hepatic lobe 2.4 x 3 cm (was 1.8 x 2.2 cm), left hepatic lobe: Stable  Rash on the face: 10/16/2024: Treated with steroids.  Viral swab negative for zoster.  Recommended discontinuation of Xeloda  and Tucatinib .  Continue to apply triamcinolone  cream.  Recommendation: Margetuximab with gemcitabine was discussed in detail including side effects.  We also discussed the potential future treatments like trastuzumab  duocarmazine might become FDA approved for us  to consider changing treatment in the future. Return to clinic in 1 week to start her new treatment.  Orders  Placed This Encounter  Procedures   CBC with Differential (Cancer Center Only)    Standing Status:   Future    Expected Date:   11/06/2024    Expiration Date:   11/06/2025   CMP (Cancer Center only)    Standing Status:   Future    Expected Date:   11/06/2024    Expiration Date:   11/06/2025   CBC with Differential (Cancer Center Only)    Standing Status:   Future    Expected Date:   11/27/2024    Expiration Date:   11/27/2025   CMP (Cancer Center only)    Standing Status:   Future    Expected Date:   11/27/2024    Expiration Date:   11/27/2025   CBC with Differential (Cancer Center Only)    Standing Status:   Future    Expected Date:   12/18/2024    Expiration Date:   12/18/2025   CMP (Cancer Center only)    Standing Status:   Future    Expected Date:   12/18/2024    Expiration Date:   12/18/2025   CBC with Differential (Cancer Center Only)    Standing Status:   Future    Expected Date:   01/08/2025    Expiration Date:   01/08/2026   CMP (Cancer Center only)    Standing Status:   Future    Expected Date:   01/08/2025    Expiration Date:   01/08/2026   CBC with Differential (Cancer Center Only)    Standing Status:   Future    Expected Date:    01/29/2025    Expiration Date:   01/29/2026   CMP (Cancer Center only)    Standing Status:   Future    Expected Date:   01/29/2025    Expiration Date:   01/29/2026   CBC with Differential (Cancer Center Only)    Standing Status:   Future    Expected Date:   02/19/2025    Expiration Date:   02/19/2026   CMP (Cancer Center only)    Standing Status:   Future    Expected Date:   02/19/2025    Expiration Date:   02/19/2026   The patient has a good understanding of the overall plan. she agrees with it. she will call with any problems that may develop before the next visit here.  I personally spent a total of 45 minutes in the care of the patient today including preparing to see the patient, getting/reviewing separately obtained history, performing a medically appropriate exam/evaluation, counseling and educating, placing orders, referring and communicating with other health care professionals, documenting clinical information in the EHR, independently interpreting results, communicating results, and coordinating care.   Viinay K Sherena Machorro, MD 10/30/2024     "

## 2024-10-31 ENCOUNTER — Other Ambulatory Visit: Payer: Self-pay

## 2024-10-31 ENCOUNTER — Encounter: Payer: Self-pay | Admitting: Hematology and Oncology

## 2024-11-01 ENCOUNTER — Other Ambulatory Visit: Payer: Self-pay

## 2024-11-03 ENCOUNTER — Other Ambulatory Visit: Payer: Self-pay

## 2024-11-05 ENCOUNTER — Other Ambulatory Visit: Payer: Self-pay

## 2024-11-06 ENCOUNTER — Inpatient Hospital Stay

## 2024-11-06 NOTE — Progress Notes (Signed)
 Pharmacist Chemotherapy Monitoring - Initial Assessment    Anticipated start date: 11/07/24   The following has been reviewed per standard work regarding the patient's treatment regimen: The patient's diagnosis, treatment plan and drug doses, and organ/hematologic function Lab orders and baseline tests specific to treatment regimen  The treatment plan start date, drug sequencing, and pre-medications Prior authorization status  Patient's documented medication list, including drug-drug interaction screen and prescriptions for anti-emetics and supportive care specific to the treatment regimen The drug concentrations, fluid compatibility, administration routes, and timing of the medications to be used The patient's access for treatment and lifetime cumulative dose history, if applicable  The patient's medication allergies and previous infusion related reactions, if applicable   Changes made to treatment plan:  N/A  Follow up needed:  Pending authorization for treatment  and prescriptions needed for anti-emetics   Makalynn Berwanger, Pharm.D., CPP 11/06/2024@10 :41 AM

## 2024-11-06 NOTE — Assessment & Plan Note (Signed)
 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  Adjuvant chemotherapy with Taxol  and Herceptin  followed by Herceptin  maintenance completed 07/06/2022 2. breast radiation completed 01/04/2022 3.  Metastatic disease diagnosis:CT chest on 11/01/2022: Right upper lobe nodules largest measuring 5 mm, 01/13/23: Liver Biopsy: Met breast cancer ER 0%, PR 0%, Ki67 50%, HER2 3+  4.  Enhertu  02/07/2023-09/13/2023 5.  Herceptin  Tucatinib  Xeloda  December 2024-December 2025 ---------------------------------------------------------------------------------------------------------------------   CT CAP 01/15/2024: Liver lesions diminished in size (1.6 cm was 3.4 cm, 1.6 cm was 2.3 cm), diminished size of the right lower lobe lung nodule 0.6 cm was 0.9 cm CT CAP 04/04/2024: Stable hepatic metastases MRI Brain 05/29/24: No evidence of recurrent tumor in Rt post temporal lobe CT CAP 08/05/24: Increased size of liver met 2.2 cm (was 1.7 cm), Stable 10 mm and 1.4 cm Left lobe of liver mets, 5 mm lung nodule stable CT CAP 10/05/2024: Right lower lobe nodules slightly enlarged, increase in subcapsular right hepatic lobe 2.4 x 3 cm (was 1.8 x 2.2 cm), left hepatic lobe: Stable   Rash on the face: 10/16/2024: Treated with steroids.    Current treatment: Margetuximab  with gemcitabine  every 3 weeks

## 2024-11-07 ENCOUNTER — Other Ambulatory Visit (HOSPITAL_COMMUNITY): Payer: Self-pay

## 2024-11-07 ENCOUNTER — Inpatient Hospital Stay: Admitting: Hematology and Oncology

## 2024-11-07 ENCOUNTER — Other Ambulatory Visit: Payer: Self-pay

## 2024-11-07 ENCOUNTER — Inpatient Hospital Stay

## 2024-11-07 VITALS — BP 113/65 | HR 77 | Temp 98.0°F | Resp 16

## 2024-11-07 VITALS — BP 110/68 | HR 73 | Temp 98.0°F | Resp 16 | Wt 132.9 lb

## 2024-11-07 DIAGNOSIS — C50111 Malignant neoplasm of central portion of right female breast: Secondary | ICD-10-CM | POA: Diagnosis not present

## 2024-11-07 DIAGNOSIS — Z171 Estrogen receptor negative status [ER-]: Secondary | ICD-10-CM

## 2024-11-07 DIAGNOSIS — Z5112 Encounter for antineoplastic immunotherapy: Secondary | ICD-10-CM | POA: Diagnosis not present

## 2024-11-07 LAB — CMP (CANCER CENTER ONLY)
ALT: 16 U/L (ref 0–44)
AST: 24 U/L (ref 15–41)
Albumin: 4.2 g/dL (ref 3.5–5.0)
Alkaline Phosphatase: 117 U/L (ref 38–126)
Anion gap: 11 (ref 5–15)
BUN: 10 mg/dL (ref 8–23)
CO2: 24 mmol/L (ref 22–32)
Calcium: 9 mg/dL (ref 8.9–10.3)
Chloride: 105 mmol/L (ref 98–111)
Creatinine: 0.68 mg/dL (ref 0.44–1.00)
GFR, Estimated: >60 mL/min
Glucose, Bld: 93 mg/dL (ref 70–99)
Potassium: 3.7 mmol/L (ref 3.5–5.1)
Sodium: 140 mmol/L (ref 135–145)
Total Bilirubin: 0.3 mg/dL (ref 0.0–1.2)
Total Protein: 6.7 g/dL (ref 6.5–8.1)

## 2024-11-07 LAB — CBC WITH DIFFERENTIAL (CANCER CENTER ONLY)
Abs Immature Granulocytes: 0.01 K/uL (ref 0.00–0.07)
Basophils Absolute: 0.1 K/uL (ref 0.0–0.1)
Basophils Relative: 1 %
Eosinophils Absolute: 0.2 K/uL (ref 0.0–0.5)
Eosinophils Relative: 3 %
HCT: 35.7 % — ABNORMAL LOW (ref 36.0–46.0)
Hemoglobin: 12 g/dL (ref 12.0–15.0)
Immature Granulocytes: 0 %
Lymphocytes Relative: 39 %
Lymphs Abs: 2.4 K/uL (ref 0.7–4.0)
MCH: 32.3 pg (ref 26.0–34.0)
MCHC: 33.6 g/dL (ref 30.0–36.0)
MCV: 96.2 fL (ref 80.0–100.0)
Monocytes Absolute: 0.6 K/uL (ref 0.1–1.0)
Monocytes Relative: 9 %
Neutro Abs: 3 K/uL (ref 1.7–7.7)
Neutrophils Relative %: 48 %
Platelet Count: 178 K/uL (ref 150–400)
RBC: 3.71 MIL/uL — ABNORMAL LOW (ref 3.87–5.11)
RDW: 14.6 % (ref 11.5–15.5)
WBC Count: 6.2 K/uL (ref 4.0–10.5)
nRBC: 0 % (ref 0.0–0.2)

## 2024-11-07 MED ORDER — PALONOSETRON HCL INJECTION 0.25 MG/5ML
0.2500 mg | Freq: Once | INTRAVENOUS | Status: AC
  Administered 2024-11-07: 0.25 mg via INTRAVENOUS
  Filled 2024-11-07: qty 5

## 2024-11-07 MED ORDER — DEXAMETHASONE 4 MG PO TABS
4.0000 mg | ORAL_TABLET | Freq: Every day | ORAL | 1 refills | Status: AC
  Filled 2024-11-07: qty 30, 30d supply, fill #0

## 2024-11-07 MED ORDER — SODIUM CHLORIDE 0.9 % IV SOLN: INTRAVENOUS | Status: DC

## 2024-11-07 MED ORDER — ACETAMINOPHEN 325 MG PO TABS
650.0000 mg | ORAL_TABLET | Freq: Once | ORAL | Status: AC
  Administered 2024-11-07: 650 mg via ORAL
  Filled 2024-11-07: qty 2

## 2024-11-07 MED ORDER — DEXAMETHASONE SOD PHOSPHATE PF 10 MG/ML IJ SOLN
10.0000 mg | Freq: Once | INTRAMUSCULAR | Status: AC
  Administered 2024-11-07: 10 mg via INTRAVENOUS
  Filled 2024-11-07: qty 1

## 2024-11-07 MED ORDER — SODIUM CHLORIDE 0.9 % IV SOLN
800.0000 mg/m2 | Freq: Once | INTRAVENOUS | Status: AC
  Administered 2024-11-07: 1330 mg via INTRAVENOUS
  Filled 2024-11-07: qty 34.98

## 2024-11-07 MED ORDER — DIPHENHYDRAMINE HCL 25 MG PO CAPS
25.0000 mg | ORAL_CAPSULE | Freq: Once | ORAL | Status: AC
  Administered 2024-11-07: 25 mg via ORAL
  Filled 2024-11-07: qty 1

## 2024-11-07 MED ORDER — SODIUM CHLORIDE 0.9 % IV SOLN
15.0000 mg/kg | Freq: Once | INTRAVENOUS | Status: AC
  Administered 2024-11-07: 1000 mg via INTRAVENOUS
  Filled 2024-11-07: qty 40

## 2024-11-07 NOTE — Progress Notes (Signed)
 "  Patient Care Team: Alvia Bring, DO as PCP - General (Family Medicine) Izell Domino, MD as Consulting Physician (Radiation Oncology) Buckley, Arthea POUR, MD as Consulting Physician (Oncology) [facility] [place] ([facility]mology) Terri Alan PARAS, MD as Referring Physician (Otolaryngology) Odean Potts, MD as Medical Oncologist (Hematology and Oncology) Vanderbilt Ned, MD as Consulting Physician (General Surgery) Elisa Lonni GORMAN DEVONNA (Dermatology)  DIAGNOSIS:  Encounter Diagnosis  Name Primary?   Malignant neoplasm of central portion of right breast in female, estrogen receptor negative (HCC) Yes    SUMMARY OF ONCOLOGIC HISTORY: Oncology History  Malignant neoplasm of central portion of right breast in female, estrogen receptor negative (HCC)  01/04/2012 Initial Diagnosis   Right breast cancer: Stage Ia ER/PR positive HER2 negative status postlumpectomy, radiation (low risk Oncotype) could not tolerate tamoxifen  for more than 30 days.   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 No pathogenic variants identified on the [facility] Multi-Cancer Panel+RNA. VUS in POLD1 identified called c.2429C>T. The report date is 07/01/2021.  The Multi-Cancer Panel + RNA  offered by [facility] includes sequencing and/or deletion duplication testing of the following 84 genes: AIP, ALK, APC, ATM, AXIN2,BAP1,  BARD1, BLM, BMPR1A, BRCA1, BRCA2, BRIP1, CASR, CDC73, CDH1, CDK4, CDKN1B, CDKN1C, CDKN2A (p14ARF), CDKN2A (p16INK4a), CEBPA, CHEK2, CTNNA1, DICER1, DIS3L2, EGFR (c.2369C>T, p.Thr790Met variant only), EPCAM (Deletion/duplication testing only), FH, FLCN, GATA2, GPC3, GREM1 (Promoter region deletion/duplication testing only), HOXB13 (c.251G>A, p.Gly84Glu), HRAS, KIT, MAX, MEN1, MET, MITF (c.952G>A, p.Glu318Lys variant only), MLH1, MSH2, MSH3, MSH6, MUTYH, NBN, NF1, NF2, NTHL1, PALB2, PDGFRA, PHOX2B, PMS2, POLD1, POLE, POT1, PRKAR1A, PTCH1, PTEN, RAD50, RAD51C, RAD51D, RB1, RECQL4, RET, RUNX1, SDHAF2, SDHA (sequence changes only), SDHB, SDHC, SDHD, SMAD4, SMARCA4, SMARCB1, SMARCE1, STK11, SUFU, TERC, TERT, TMEM127, TP53, TSC1, TSC2, VHL, WRN and WT1.   06/18/2021 Cancer Staging   Staging form: Breast, AJCC 8th Edition - Pathologic stage from 06/18/2021: Stage IA (pT1c, pN0, cM0, G3, ER-, PR-, HER2+) - Signed by Crawford Morna Pickle, NP on 10/05/2022 Stage prefix: Initial diagnosis Histologic grading system: 3 grade system   07/21/2021 - 07/06/2022 Chemotherapy   Patient is on Treatment Plan : BREAST Paclitaxel  + Trastuzumab  q7d / Trastuzumab  q21d     11/25/2021 - 01/04/2022 Radiation Therapy   Site Technique Total Dose (Gy) Dose per Fx (Gy) Completed Fx Beam Energies  Breast, Right: Breast_R 3D 45/45 1.8 25/25 6XFFF  Breast, Right: Breast_R_Bst specialPort 5.4/5.4 1.8 3/3 12E     12/29/2022 Progression   CT chest on 11/01/2022: Right upper lobe nodules largest measuring 5 mm CT chest 12/29/2022: Upper and mid lung zone predominant pulmonary nodules are worrisome for metastatic disease.  Left and right hepatic lobe masses (2.5 cm and 3.9 cm) are new and are worrisome for metastatic disease.  01/13/2023 Initial Biopsy   Right liver biopsy: Metastatic carcinoma with breast  primary, ER-, PR-, HER2 +, Ki-67 50%.    01/13/2023 Cancer Staging   Staging form: Breast, AJCC 8th Edition - Pathologic stage from 01/13/2023: Stage IV (pM1, G3, ER-, PR-, HER2+) - Signed by Crawford Morna Pickle, NP on 02/07/2023 Histologic grading system: 3 grade system   01/17/2023 PET scan   PET CT scan 01/17/2023: Hypermetabolic bilobar hepatic (2 masses measuring 2.8 cm SUV 11.1, another mass 4.1 cm SUV 10.3) and pulmonary metastases (right upper lobe lung nodule 5 mm SUV 2.6 right lower lobe 6 mm nodule SUV 1.8)    02/07/2023 - 09/13/2023 Chemotherapy   Patient is on Treatment Plan : BREAST METASTATIC Fam-Trastuzumab Deruxtecan-nxki  (Enhertu ) (5.4) q21d     10/04/2023 - 10/16/2024 Chemotherapy   Patient is on Treatment Plan : BREAST Trastuzumab  (8/6) IV D1 + Capecitabine  +  Tucatinib  q21d     11/06/2024 -  Chemotherapy   Patient is on Treatment Plan : BREAST Margetuximab  D1 + Gemcitabine  D1,8 q21d       CHIEF COMPLIANT: Cycle 1 margetuximab  and gemcitabine   HISTORY OF PRESENT ILLNESS:   History of Present Illness Lori Mcmillan is a 64 year old female with metastatic HER2-positive, 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  Over the past ten days she has developed a pruritic papular facial rash, initially at the jawline and now extending superiorly onto the scalp. She describes it as acneiform or rosacea-like. There is no involvement of the chest, arms, or legs and no similar lesions elsewhere. She notes slight improvement compared with two weeks ago but remains concerned that it is still spreading. She has used triamcinolone  cream, moisturizers, and antibacterial soap with purified water, with only partial relief.  She has tolerated prior HER2-targeted therapies without notable skin toxicity. She has prednisone  available at home. She had been taking immune-boosting  supplements including elderberry gummies, vitamins, and an immunity tea, and has agreed to stop these as recommended.  She reports significant mental fatigue and emotional distress related to ongoing metastatic disease management, including missed holidays and a perceived decline in quality of life. She is interested in strategies to improve mental well-being and in planning a brief break or short trip while continuing therapy.  She is uncertain about a recent call regarding an endocrinology appointment with Dr. Phebe and notes her prior ENT, Dr. Vessie, is no longer in practice.       ALLERGIES:  has no known allergies.  MEDICATIONS:  Current Outpatient Medications  Medication Sig Dispense Refill   acetaminophen  (TYLENOL ) 500 MG tablet Take 500 mg by mouth as needed.     diphenoxylate -atropine  (LOMOTIL ) 2.5-0.025 MG tablet Take 1 tablet by mouth 4 (four) times daily as needed for diarrhea or loose stools. 30 tablet 3   Famotidine -Ca Carb-Mag Hydrox (PEPCID  COMPLETE PO) Take by mouth as needed.     heparin  10,000 Units in sodium chloride  0.9 % 18 mL by CRRT route continuous. Every 21 days     triamcinolone  ointment (KENALOG ) 0.5 % Apply 1 Application topically 2 (two) times daily. 30 g 0   No current facility-administered medications for this visit.    PHYSICAL EXAMINATION: ECOG PERFORMANCE STATUS: 1 - Symptomatic but completely ambulatory  Vitals:   11/07/24 0807  BP: 110/68  Pulse: 73  Resp: 16  Temp: 98 F (36.7 C)  SpO2: 98%   Filed Weights  11/07/24 0807  Weight: 132 lb 14.4 oz (60.3 kg)      LABORATORY DATA:  I have reviewed the data as listed    Latest Ref Rng & Units 10/04/2024    7:46 AM 07/24/2024    9:47 AM 05/22/2024   11:19 AM  CMP  Glucose 70 - 99 mg/dL 92  91  98   BUN 8 - 23 mg/dL 14  10  12    Creatinine 0.44 - 1.00 mg/dL 9.11  9.14  9.27   Sodium 135 - 145 mmol/L 139  139  137   Potassium 3.5 - 5.1 mmol/L 3.8  3.6  3.9   Chloride 98 - 111 mmol/L  105  108  106   CO2 22 - 32 mmol/L 24  28  26    Calcium 8.9 - 10.3 mg/dL 9.1  9.2  9.0   Total Protein 6.5 - 8.1 g/dL 6.8  6.5  6.6   Total Bilirubin 0.0 - 1.2 mg/dL 0.5  0.5  0.5   Alkaline Phos 38 - 126 U/L 110  94  115   AST 15 - 41 U/L 27  18  33   ALT 0 - 44 U/L 22  17  33     Lab Results  Component Value Date   WBC 6.2 11/07/2024   HGB 12.0 11/07/2024   HCT 35.7 (L) 11/07/2024   MCV 96.2 11/07/2024   PLT 178 11/07/2024   NEUTROABS 3.0 11/07/2024    ASSESSMENT & PLAN:  Malignant neoplasm of central portion of right breast in female, estrogen receptor negative (HCC) 06/18/2021:Right lumpectomy: 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  Adjuvant chemotherapy with Taxol  and Herceptin  followed by Herceptin  maintenance completed 07/06/2022 2. breast radiation completed 01/04/2022 3.  Metastatic disease diagnosis:CT chest on 11/01/2022: Right upper lobe nodules largest measuring 5 mm, 01/13/23: Liver Biopsy: Met breast cancer ER 0%, PR 0%, Ki67 50%, HER2 3+  4.  Enhertu  02/07/2023-09/13/2023 5.  Herceptin  Tucatinib  Xeloda  December 2024-December 2025 ---------------------------------------------------------------------------------------------------------------------   CT CAP 01/15/2024: Liver lesions diminished in size (1.6 cm was 3.4 cm, 1.6 cm was 2.3 cm), diminished size of the right lower lobe lung nodule 0.6 cm was 0.9 cm CT CAP 04/04/2024: Stable hepatic metastases MRI Brain 05/29/24: No evidence of recurrent tumor in Rt post temporal lobe CT CAP 08/05/24: Increased size of liver met 2.2 cm (was 1.7 cm), Stable 10 mm and 1.4 cm Left lobe of liver mets, 5 mm lung nodule stable CT CAP 10/05/2024: Right lower lobe  nodules slightly enlarged, increase in subcapsular right hepatic lobe 2.4 x 3 cm (was 1.8 x 2.2 cm), left hepatic lobe: Stable   Rash on the face: 10/16/2024: Treated with steroids.  Appears to be worse on the chin and the neck area  Current treatment: Margetuximab  with gemcitabine  every 3 weeks Return to clinic in 1 week for toxicity check ------------------------------------- Assessment and Plan Assessment & Plan Malignant neoplasm of central portion of right breast, estrogen receptor negative Initiated margetuximab  and gemcitabine , omitting day 8 for tolerability. Fatigue attributed to mental exhaustion. Prioritized quality of life and minimizing toxicity with flexible scheduling. - Continued margetuximab  and gemcitabine , omitting day 8 dosing. - Ordered weekly follow-up after first  cycle for lab assessment (CBC, liver, kidney, electrolytes), clinical evaluation, symptom review. - Scheduled chemotherapy rounds with planned breaks for personal activities, emphasizing shared decision making and quality of life.  Chemotherapy-induced facial rash Persistent, spreading, pruritic, papular facial rash, likely chemotherapy-induced. Partial improvement with triamcinolone  ointment. Rash limited to face, closely monitored. - Prescribed dexamethasone  for three days. - Continued triamcinolone  ointment. - Advised discontinuation of non-prescribed supplements and immune boosters. - Instructed her to photograph rash for follow-up comparison. - Ordered weekly follow-up to reassess rash and condition. - Referral to dermatology if rash does not improve or worsens.      No orders of the defined types were placed in this encounter.  The patient has a good understanding of the overall plan. she agrees with it. she will call with any problems that may develop before the next visit here.  I personally spent a total of 30 minutes in the care of the patient today including preparing to see the patient,  getting/reviewing separately obtained history, performing a medically appropriate exam/evaluation, counseling and educating, placing orders, referring and communicating with other health care professionals, documenting clinical information in the EHR, independently interpreting results, communicating results, and coordinating care.   Viinay K Gianny Sabino, MD 11/07/24    "

## 2024-11-07 NOTE — Patient Instructions (Signed)
 [facility] - A DEPT OF [facility]  Discharge Instructions: Thank you for choosing [facility] to provide your oncology and hematology care.   If you have a lab appointment with the Cancer Center, please go directly to the Cancer Center and check in at the registration area.   Wear comfortable clothing and clothing appropriate for easy access to any Portacath or PICC line.   We strive to give you quality time with your provider. You may need to reschedule your appointment if you arrive late (15 or more minutes).  Arriving late affects you and other patients whose appointments are after yours.  Also, if you miss three or more appointments without notifying the office, you may be dismissed from the clinic at the providers discretion.      For prescription refill requests, have your pharmacy contact our office and allow 72 hours for refills to be completed.    Today you received the following chemotherapy and/or immunotherapy agents: Gemcitabine  (Gemzar ) & Margetuximab -cmkb (Margenza )      To help prevent nausea and vomiting after your treatment, we encourage you to take your nausea medication as directed.  BELOW ARE SYMPTOMS THAT SHOULD BE REPORTED IMMEDIATELY: *FEVER GREATER THAN 100.4 F (38 C) OR HIGHER *CHILLS OR SWEATING *NAUSEA AND VOMITING THAT IS NOT CONTROLLED WITH YOUR NAUSEA MEDICATION *UNUSUAL SHORTNESS OF BREATH *UNUSUAL BRUISING OR BLEEDING *URINARY PROBLEMS (pain or burning when urinating, or frequent urination) *BOWEL PROBLEMS (unusual diarrhea, constipation, pain near the anus) TENDERNESS IN MOUTH AND THROAT WITH OR WITHOUT PRESENCE OF ULCERS (sore throat, sores in mouth, or a toothache) UNUSUAL RASH, SWELLING OR PAIN  UNUSUAL VAGINAL DISCHARGE OR ITCHING   Items with * indicate a potential emergency and should be followed up as soon as possible or go to the Emergency Department if any problems should occur.  Please show the  CHEMOTHERAPY ALERT CARD or IMMUNOTHERAPY ALERT CARD at check-in to the Emergency Department and triage nurse.  Should you have questions after your visit or need to cancel or reschedule your appointment, please contact [facility] - A DEPT OF [facility]  Dept: [phone]  and follow the prompts.  Office hours are 8:00 a.m. to 4:30 p.m. Monday - Friday. Please note that voicemails left after 4:00 p.m. may not be returned until the following business day.  We are closed weekends and major holidays. You have access to a nurse at all times for urgent questions. Please call the main number to the clinic Dept: [phone] and follow the prompts.   For any non-urgent questions, you may also contact your provider using MyChart. We now offer e-Visits for anyone 19 and older to request care online for non-urgent symptoms. For details visit [email or web address].   Also download the MyChart app! Go to the app store, search MyChart, open the app, select [facility], and log in with your MyChart username and password.

## 2024-11-08 ENCOUNTER — Telehealth: Payer: Self-pay

## 2024-11-08 ENCOUNTER — Other Ambulatory Visit: Payer: Self-pay

## 2024-11-08 NOTE — Telephone Encounter (Signed)
-----   Message from Nurse Rivka SQUIBB, RN sent at 11/07/2024  1:05 PM EST ----- Regarding: Dr. Gudena first timer pt call back Ms. Shurley completed first time Gemzar  and Margenza  tx with no issues or complaints. VSS, declined AVS but educated to call us  with any issues or concerns. Ambulatory to lobby.

## 2024-11-12 ENCOUNTER — Encounter: Payer: Self-pay | Admitting: Hematology and Oncology

## 2024-11-13 ENCOUNTER — Other Ambulatory Visit: Payer: Self-pay

## 2024-11-13 ENCOUNTER — Other Ambulatory Visit: Payer: Self-pay | Admitting: *Deleted

## 2024-11-13 DIAGNOSIS — C50111 Malignant neoplasm of central portion of right female breast: Secondary | ICD-10-CM

## 2024-11-13 MED ORDER — METHYLPREDNISOLONE 4 MG PO TBPK: ORAL_TABLET | ORAL | 0 refills | Status: DC

## 2024-11-13 NOTE — Progress Notes (Signed)
 Pt sent mychart message reporting recent rash that began post chemo treatment on 1/21. Per Odean MD, pt to take medrol  dosepak, however pt reports that due to icy road conditions she will likely not be able to make it out of her neighborhood today to get the medication from the [facility]. Pt reports that she has 7 20 mg prednisone  tabs left over from previous treatment. Gudena MD advised her to take 3 of those today and then begin the medrol  dose pack tomorrow if she can get to the [facility] tomorrow. Pt verbalized understanding.

## 2024-11-15 ENCOUNTER — Inpatient Hospital Stay

## 2024-11-15 ENCOUNTER — Inpatient Hospital Stay: Admitting: Hematology and Oncology

## 2024-11-15 ENCOUNTER — Other Ambulatory Visit: Payer: Self-pay | Admitting: *Deleted

## 2024-11-15 VITALS — BP 114/56 | HR 83 | Temp 97.9°F | Resp 18 | Ht 64.0 in | Wt 134.4 lb

## 2024-11-15 DIAGNOSIS — Z171 Estrogen receptor negative status [ER-]: Secondary | ICD-10-CM

## 2024-11-15 DIAGNOSIS — C50111 Malignant neoplasm of central portion of right female breast: Secondary | ICD-10-CM

## 2024-11-15 DIAGNOSIS — R21 Rash and other nonspecific skin eruption: Secondary | ICD-10-CM

## 2024-11-15 DIAGNOSIS — Z5112 Encounter for antineoplastic immunotherapy: Secondary | ICD-10-CM | POA: Diagnosis not present

## 2024-11-15 LAB — CMP (CANCER CENTER ONLY)
ALT: 25 U/L (ref 0–44)
AST: 26 U/L (ref 15–41)
Albumin: 4.2 g/dL (ref 3.5–5.0)
Alkaline Phosphatase: 112 U/L (ref 38–126)
Anion gap: 12 (ref 5–15)
BUN: 10 mg/dL (ref 8–23)
CO2: 23 mmol/L (ref 22–32)
Calcium: 9 mg/dL (ref 8.9–10.3)
Chloride: 105 mmol/L (ref 98–111)
Creatinine: 0.57 mg/dL (ref 0.44–1.00)
GFR, Estimated: >60 mL/min
Glucose, Bld: 101 mg/dL — ABNORMAL HIGH (ref 70–99)
Potassium: 4.2 mmol/L (ref 3.5–5.1)
Sodium: 139 mmol/L (ref 135–145)
Total Bilirubin: 0.3 mg/dL (ref 0.0–1.2)
Total Protein: 6.6 g/dL (ref 6.5–8.1)

## 2024-11-15 LAB — CBC WITH DIFFERENTIAL (CANCER CENTER ONLY)
Abs Immature Granulocytes: 0.02 10*3/uL (ref 0.00–0.07)
Basophils Absolute: 0 10*3/uL (ref 0.0–0.1)
Basophils Relative: 0 %
Eosinophils Absolute: 0.2 10*3/uL (ref 0.0–0.5)
Eosinophils Relative: 2 %
HCT: 35.4 % — ABNORMAL LOW (ref 36.0–46.0)
Hemoglobin: 12 g/dL (ref 12.0–15.0)
Immature Granulocytes: 0 %
Lymphocytes Relative: 17 %
Lymphs Abs: 1.4 10*3/uL (ref 0.7–4.0)
MCH: 32.5 pg (ref 26.0–34.0)
MCHC: 33.9 g/dL (ref 30.0–36.0)
MCV: 95.9 fL (ref 80.0–100.0)
Monocytes Absolute: 0.5 10*3/uL (ref 0.1–1.0)
Monocytes Relative: 6 %
Neutro Abs: 5.9 10*3/uL (ref 1.7–7.7)
Neutrophils Relative %: 75 %
Platelet Count: 74 10*3/uL — ABNORMAL LOW (ref 150–400)
RBC: 3.69 MIL/uL — ABNORMAL LOW (ref 3.87–5.11)
RDW: 14 % (ref 11.5–15.5)
Smear Review: NORMAL
WBC Count: 8 10*3/uL (ref 4.0–10.5)
nRBC: 0 % (ref 0.0–0.2)

## 2024-11-15 MED ORDER — METHYLPREDNISOLONE SODIUM SUCC 125 MG IJ SOLR
125.0000 mg | Freq: Once | INTRAMUSCULAR | Status: AC
  Administered 2024-11-15: 125 mg via INTRAVENOUS
  Filled 2024-11-15: qty 2

## 2024-11-15 MED ORDER — DIAZEPAM 2 MG PO TABS: 2.0000 mg | ORAL_TABLET | Freq: Four times a day (QID) | ORAL | 1 refills | Status: AC | PRN

## 2024-11-15 MED ORDER — GABAPENTIN 300 MG PO CAPS: 300.0000 mg | ORAL_CAPSULE | Freq: Two times a day (BID) | ORAL | 3 refills | Status: AC

## 2024-11-15 NOTE — Progress Notes (Signed)
 "  Patient Care Team: Alvia Bring, DO as PCP - General (Family Medicine) Izell Domino, MD as Consulting Physician (Radiation Oncology) Buckley, Arthea POUR, MD as Consulting Physician (Oncology) [facility] ([facility]) Terri Alan PARAS, MD as Referring Physician (Otolaryngology) Odean Potts, MD as Medical Oncologist (Hematology and Oncology) [facility] Ned, MD as Consulting Physician (General Surgery) Elisa Lonni GORMAN DEVONNA (Dermatology)  DIAGNOSIS:  Encounter Diagnosis  Name Primary?   Malignant neoplasm of central portion of right breast in female, estrogen receptor negative (HCC) Yes    SUMMARY OF ONCOLOGIC HISTORY: Oncology History  Malignant neoplasm of central portion of right breast in female, estrogen receptor negative (HCC)  01/04/2012 Initial Diagnosis   Right breast cancer: Stage Ia ER/PR positive HER2 negative status postlumpectomy, radiation (low risk Oncotype) could not tolerate tamoxifen  for more than 30 days.   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 No pathogenic variants identified on the [facility] Multi-Cancer Panel+RNA. VUS in POLD1 identified called c.2429C>T. The report date is 07/01/2021.  The Multi-Cancer Panel + RNA  offered by [facility] includes sequencing and/or deletion duplication testing of the following 84 genes: AIP, ALK, APC, ATM, AXIN2,BAP1,  BARD1, BLM, BMPR1A, BRCA1, BRCA2, BRIP1, CASR, CDC73, CDH1, CDK4, CDKN1B, CDKN1C, CDKN2A (p14ARF), CDKN2A (p16INK4a), CEBPA, CHEK2, CTNNA1, DICER1, DIS3L2, EGFR (c.2369C>T, p.Thr790Met variant only), EPCAM (Deletion/duplication testing only), FH, FLCN, GATA2, GPC3, GREM1 (Promoter region deletion/duplication testing only), HOXB13 (c.251G>A, p.Gly84Glu), HRAS, KIT, MAX, MEN1, MET, MITF (c.952G>A, p.Glu318Lys variant only), MLH1, MSH2, MSH3, MSH6, MUTYH, NBN, NF1, NF2, NTHL1, PALB2, PDGFRA, PHOX2B, PMS2, POLD1, POLE, POT1, PRKAR1A, PTCH1, PTEN, RAD50, RAD51C, RAD51D, RB1, RECQL4, RET, RUNX1, SDHAF2, SDHA (sequence changes only), SDHB, SDHC, SDHD, SMAD4, SMARCA4, SMARCB1, SMARCE1, STK11, SUFU, TERC, TERT, TMEM127, TP53, TSC1, TSC2, VHL, WRN and WT1.   06/18/2021 Cancer Staging   Staging form: Breast, AJCC 8th Edition - Pathologic stage from 06/18/2021: Stage IA (pT1c, pN0, cM0, G3, ER-, PR-, HER2+) - Signed by Crawford Morna Pickle, NP on 10/05/2022 Stage prefix: Initial diagnosis Histologic grading system: 3 grade system   07/21/2021 - 07/06/2022 Chemotherapy   Patient is on Treatment Plan : BREAST Paclitaxel  + Trastuzumab  q7d / Trastuzumab  q21d     11/25/2021 - 01/04/2022 Radiation Therapy   Site Technique Total Dose (Gy) Dose per Fx (Gy) Completed Fx Beam Energies  Breast, Right: Breast_R 3D 45/45 1.8 25/25 6XFFF  Breast, Right: Breast_R_Bst specialPort 5.4/5.4 1.8 3/3 12E     12/29/2022 Progression   CT chest on 11/01/2022: Right upper lobe nodules largest measuring 5 mm CT chest 12/29/2022: Upper and mid lung zone predominant pulmonary nodules are worrisome for metastatic disease.  Left and right hepatic lobe masses (2.5 cm and 3.9 cm) are new and are worrisome for metastatic disease.  01/13/2023 Initial Biopsy   Right liver biopsy: Metastatic carcinoma with breast  primary, ER-, PR-, HER2 +, Ki-67 50%.    01/13/2023 Cancer Staging   Staging form: Breast, AJCC 8th Edition - Pathologic stage from 01/13/2023: Stage IV (pM1, G3, ER-, PR-, HER2+) - Signed by Crawford Morna Pickle, NP on 02/07/2023 Histologic grading system: 3 grade system   01/17/2023 PET scan   PET CT scan 01/17/2023: Hypermetabolic bilobar hepatic (2 masses measuring 2.8 cm SUV 11.1, another mass 4.1 cm SUV 10.3) and pulmonary metastases (right upper lobe lung nodule 5 mm SUV 2.6 right lower lobe 6 mm nodule SUV 1.8)    02/07/2023 - 09/13/2023 Chemotherapy   Patient is on Treatment Plan : BREAST METASTATIC Fam-Trastuzumab Deruxtecan-nxki  (Enhertu ) (5.4) q21d     10/04/2023 - 10/16/2024 Chemotherapy   Patient is on Treatment Plan : BREAST Trastuzumab  (8/6) IV D1 + Capecitabine  +  Tucatinib  q21d     11/07/2024 -  Chemotherapy   Patient is on Treatment Plan : BREAST Margetuximab  D1 + Gemcitabine  D1,8 q21d       CHIEF COMPLIANT: Rash with burning  HISTORY OF PRESENT ILLNESS:  History of Present Illness Lori Mcmillan is a 64 year old female with metastatic HER2-positive, 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  She developed a blotchy facial rash on Saturday that has progressively worsened. It began on her face and spread to the hairline, toward the ear, and down the neck and throat without extending below. By Sunday night the pain was severe and felt like a blowtorch to her face. The burning is still present but less intense than at its peak. The rash remains pruritic and distressing.  She has had partial relief with Tylenol  1000 mg, Benadryl , diazepam  2 mg, and topical triamcinolone  ointment. She has prednisone  available at home but has not used it. Despite some improvement in pain, the rash continues to spread.  She reports significant emotional distress and fatigue from  the cumulative burden of her disease and treatment. She feels mentally overwhelmed and requests continuation of diazepam  for anxiety and vertigo, noting she has a limited remaining supply.  She clarified that a recent discussion about possible ablation or Y90 for liver metastases was a miscommunication and that the consulting physician does not offer these options. She prefers a focus on symptom relief and maintaining quality of life with ongoing palliative therapy.       ALLERGIES:  has no known allergies.  MEDICATIONS:  Current Outpatient Medications  Medication Sig Dispense Refill   acetaminophen  (TYLENOL ) 500 MG tablet Take 500 mg by mouth as needed.     dexamethasone  (DECADRON ) 4 MG tablet Take 1 tablet (4 mg total) by mouth daily. Take for 2 days after margetuximab  chemotherapy. Take with breakfast. 30 tablet 1   diazepam  (VALIUM ) 2 MG tablet Take 1 tablet (2 mg total) by mouth every 6 (six) hours as needed for anxiety. 60 tablet 1   diphenoxylate -atropine  (LOMOTIL ) 2.5-0.025 MG tablet Take 1 tablet by mouth 4 (four) times daily as needed for diarrhea or loose stools. 30 tablet 3   Famotidine -Ca Carb-Mag Hydrox (PEPCID  COMPLETE PO) Take by mouth as needed.     gabapentin  (NEURONTIN ) 300 MG capsule Take 1 capsule (300 mg total) by mouth 2 (two) times daily. 60 capsule 3   heparin  10,000 Units in sodium chloride  0.9 % 18 mL by CRRT route continuous. Every 21 days     methylPREDNISolone  (MEDROL   DOSEPAK) 4 MG TBPK tablet Take as directed on the box 21 tablet 0   triamcinolone  ointment (KENALOG ) 0.5 % Apply 1 Application topically 2 (two) times daily. 30 g 0   No current facility-administered medications for this visit.   Facility-Administered Medications Ordered in Other Visits  Medication Dose Route Frequency Provider Last Rate Last Admin   methylPREDNISolone  sodium succinate (SOLU-MEDROL ) 125 mg/2 mL injection 125 mg  125 mg Intravenous Once Dale Ribeiro, MD        PHYSICAL  EXAMINATION: ECOG PERFORMANCE STATUS: 1 - Symptomatic but completely ambulatory  Vitals:   11/15/24 1000  BP: (!) 114/56  Pulse: 83  Resp: 18  Temp: 97.9 F (36.6 C)  SpO2: 98%   Filed Weights   11/15/24 1000  Weight: 134 lb 6.4 oz (61 kg)    Physical Exam SKIN: Rash present up to hairline, none below neck.  (exam performed in the presence of a chaperone)  LABORATORY DATA:  I have reviewed the data as listed    Latest Ref Rng & Units 11/15/2024    9:39 AM 11/07/2024    7:43 AM 10/04/2024    7:46 AM  CMP  Glucose 70 - 99 mg/dL 898  93  92   BUN 8 - 23 mg/dL 10  10  14    Creatinine 0.44 - 1.00 mg/dL 9.42  9.31  9.11   Sodium 135 - 145 mmol/L 139  140  139   Potassium 3.5 - 5.1 mmol/L 4.2  3.7  3.8   Chloride 98 - 111 mmol/L 105  105  105   CO2 22 - 32 mmol/L 23  24  24    Calcium 8.9 - 10.3 mg/dL 9.0  9.0  9.1   Total Protein 6.5 - 8.1 g/dL 6.6  6.7  6.8   Total Bilirubin 0.0 - 1.2 mg/dL 0.3  0.3  0.5   Alkaline Phos 38 - 126 U/L 112  117  110   AST 15 - 41 U/L 26  24  27    ALT 0 - 44 U/L 25  16  22      Lab Results  Component Value Date   WBC 8.0 11/15/2024   HGB 12.0 11/15/2024   HCT 35.4 (L) 11/15/2024   MCV 95.9 11/15/2024   PLT 74 (L) 11/15/2024   NEUTROABS 5.9 11/15/2024    ASSESSMENT & PLAN:  Malignant neoplasm of central portion of right breast in female, estrogen receptor negative (HCC) 06/18/2021:Right lumpectomy: 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  Adjuvant chemotherapy with Taxol  and Herceptin  followed by Herceptin  maintenance completed 07/06/2022 2. breast radiation completed 01/04/2022 3.  Metastatic disease diagnosis:CT chest on 11/01/2022: Right upper lobe nodules largest  measuring 5 mm, 01/13/23: Liver Biopsy: Met breast cancer ER 0%, PR 0%, Ki67 50%, HER2 3+  4.  Enhertu  02/07/2023-09/13/2023 5.  Herceptin  Tucatinib  Xeloda  December 2024-December 2025 ---------------------------------------------------------------------------------------------------------------------   CT CAP 01/15/2024: Liver lesions diminished in size (1.6 cm was 3.4 cm, 1.6 cm was 2.3 cm), diminished size of the right lower lobe lung nodule 0.6 cm was 0.9 cm CT CAP 04/04/2024: Stable hepatic metastases MRI Brain 05/29/24: No evidence of recurrent tumor in Rt post temporal lobe CT CAP 08/05/24: Increased size  of liver met 2.2 cm (was 1.7 cm), Stable 10 mm and 1.4 cm Left lobe of liver mets, 5 mm lung nodule stable CT CAP 10/05/2024: Right lower lobe nodules slightly enlarged, increase in subcapsular right hepatic lobe 2.4 x 3 cm (was 1.8 x 2.2 cm), left hepatic lobe: Stable   Rash on the face: 10/16/2024: Treated with steroids.  Appears to be worse on the chin and the neck area   Current treatment: Margetuximab  with gemcitabine  every 3 weeks started 11/07/2024 Chemotoxicities: Profound rash on the face: This rash was present even prior to starting treatment.   It got markedly worse after margetuximab  and gemcitabine .  This is causing her to have burning sensation on her face.  She is exhausted and does not want to continue any further treatment until the rash gets completely normal. We will hold off on further chemotherapy. We will give a dose of Solu-Medrol  IV 125 mg today. She will pick up Medrol  Dosepak from the pharmacy. I sent a prescription for gabapentin  to be taken at bedtime to prevent the burning sensation. Xanax  was also renewed  Telephone visit in 2 weeks to assess how she is doing. If you resume her treatment we may resume just the gemcitabine  alone and see how she does. Assessment & Plan Chemotherapy-induced facial rash Severe facial rash with neuropathic pain likely due to  chemotherapy regimen. Partial improvement with current treatment but rash persists and spreads. - Administered IV methylprednisolone  for systemic effect. - Initiated oral methylprednisolone  (Medrol  Dosepak). - Prescribed gabapentin  300 mg nightly for neuropathic pain and sleep, with option for daytime dosing if tolerated. - Continue topical triamcinolone  ointment. - Document progression with photographs. - Discussed holding one chemotherapy agent at a time to identify causative drug if rash persists. - Continue flexible chemotherapy scheduling, omitting day 8 as needed. - Consider dermatology referral if no improvement.  Malignant neoplasm of central portion of right breast, estrogen receptor negative Recurrent, metastatic HER2-positive, ER/PR-negative breast cancer with liver and lung metastases. Prognosis poor; focus on palliation and quality of life. Current focus on managing chemotherapy-induced side effects. - Continue margetuximab  D1 + gemcitabine  D1, q21d, omitting day 8 for tolerability with flexible scheduling. - Weekly follow-up after each cycle for lab assessment, clinical evaluation, and symptom review. - Monitor for treatment-related symptoms: facial rash, burning, pruritus, fatigue, mental exhaustion. - Consider ablation or Y90 for liver metastases if eligible; currently not eligible per endocrinology consult. - Emphasize palliation, minimizing toxicity, and mental health support. - Coordinate care with oncology and endocrinology for liver-directed therapy consult.  Anxiety disorder Significant emotional distress and anxiety related to metastatic disease and treatment burden. Monitored as part of survivorship and palliative care plan. - Approved continued use of diazepam  for anxiety and vertigo as needed. - Prescribed additional diazepam  (2 mg tablets, up to 60 tablets) to ensure adequate supply. - Emphasize mental health support and monitoring for emotional distress as part of  survivorship and palliative care. - Encouraged her to report worsening anxiety, depression, or mental exhaustion for further intervention or referral.      No orders of the defined types were placed in this encounter.  The patient has a good understanding of the overall plan. she agrees with it. she will call with any problems that may develop before the next visit here.  I personally spent a total of 45 minutes in the care of the patient today including preparing to see the patient, getting/reviewing separately obtained history, performing a medically appropriate exam/evaluation, counseling and  educating, placing orders, referring and communicating with other health care professionals, documenting clinical information in the EHR, independently interpreting results, communicating results, and coordinating care.   Dr.Shilpa Bushee 11/15/24    "

## 2024-11-15 NOTE — Progress Notes (Signed)
 Patient presents to infusion for solu-medrol  only per Dr Odean. Port was accessed, CHARITY FUNDRAISER administered solu-medrol , see MAR. Upon completion, VS obtained (see flowsheet), port was de-accessed and patient was discharged to lobby ambulatory with no further questions.

## 2024-11-15 NOTE — Patient Instructions (Signed)
 Methylprednisolone  Solution Injection What is this medication? METHYLPREDNISOLONE  (meth ill pred NISS oh lone) treats many conditions such as asthma, allergic reactions, arthritis, inflammatory bowel diseases, adrenal, and blood or bone marrow disorders. It works by decreasing inflammation, slowing down an overactive immune system, or replacing cortisol normally made in the body. Cortisol is a hormone that plays an important role in how the body responds to stress, illness, and injury. It belongs to a group of medications called steroids. This medicine may be used for other purposes; ask your health care provider or pharmacist if you have questions. COMMON BRAND NAME(S): A-Methapred , Solu-Medrol  What should I tell my care team before I take this medication? They need to know if you have any of these conditions: Cushing's syndrome Eye disease, vision problems Diabetes Glaucoma Heart disease High blood pressure Infection especially a viral infection, such as chickenpox, cold sores, or herpes Liver disease Mental health conditions Myasthenia gravis Osteoporosis Recent or upcoming vaccine Seizures Stomach or intestine problems Thyroid  disease An unusual or allergic reaction to lactose, methylprednisolone , other medications, foods, dyes, or preservatives Pregnant or trying to get pregnant Breastfeeding How should I use this medication? This medication is for injection or infusion into a vein. It is also for injection into a muscle. It is given by your care team in a hospital or clinic setting. Talk to your care team about the use of this medication in children. While this medication may be prescribed for selected conditions, precautions do apply. Overdosage: If you think you have taken too much of this medicine contact a poison control center or emergency room at once. NOTE: This medicine is only for you. Do not share this medicine with others. What if I miss a dose? This does not  apply. What may interact with this medication? Do not take this medication with any of the following: Alefacept Echinacea Iopamidol Live virus vaccines Metyrapone Mifepristone This medication may also interact with the following: Amphotericin B Aspirin and aspirin-like medications Certain antibiotics, such as erythromycin, clarithromycin, troleandomycin Certain medications for diabetes Certain medications for fungal infections, such as ketoconazole Certain medications for seizures, such as carbamazepine, phenobarbital, phenytoin Certain medications that treat or prevent blood clots, such as warfarin Cyclosporine Digoxin Diuretics Estrogen or progestin hormones Isoniazid NSAIDS, medications for pain and inflammation, such as ibuprofen or naproxen Other medications for myasthenia gravis Rifampin Vaccines This list may not describe all possible interactions. Give your health care provider a list of all the medicines, herbs, non-prescription drugs, or dietary supplements you use. Also tell them if you smoke, drink alcohol, or use illegal drugs. Some items may interact with your medicine. What should I watch for while using this medication? Tell your care team if your symptoms do not start to get better or if they get worse. Do not stop taking except on your care team's advice. You may develop a severe reaction. Your care team will tell you how much medication to take. Your condition will be monitored carefully while you are receiving this medication. This medication may increase your risk of getting an infection. Tell your care team if you are around anyone with measles or chickenpox, or if you develop sores or blisters that do not heal properly. This medication may increase blood sugar. Ask your care team if changes in diet or medications are needed if you have diabetes. Tell your care team right away if you have any change in your eyesight. Using this medication for a long time may  increase your risk of  low bone mass. Talk to your care team about bone health. What side effects may I notice from receiving this medication? Side effects that you should report to your care team as soon as possible: Allergic reactions--skin rash, itching, hives, swelling of the face, lips, tongue, or throat Cushing syndrome--increased fat around the midsection, upper back, neck, or face, pink or purple stretch marks on the skin, thinning, fragile skin that easily bruises, unexpected hair growth High blood sugar (hyperglycemia)--increased thirst or amount of urine, unusual weakness or fatigue, blurry vision Increase in blood pressure Infection--fever, chills, cough, sore throat, wounds that don't heal, pain or trouble when passing urine, general feeling of discomfort or being unwell Low adrenal gland function--nausea, vomiting, loss of appetite, unusual weakness or fatigue, dizziness Mood and behavior changes--anxiety, nervousness, confusion, hallucinations, irritability, hostility, thoughts of suicide or self-harm, worsening mood, feelings of depression Stomach bleeding--bloody or black, tar-like stools, vomiting blood or brown material that looks like coffee grounds Swelling of the ankles, hands, or feet Side effects that usually do not require medical attention (report to your care team if they continue or are bothersome): Acne General discomfort and fatigue Headache Increase in appetite Nausea Trouble sleeping Weight gain This list may not describe all possible side effects. Call your doctor for medical advice about side effects. You may report side effects to FDA at 1-800-FDA-1088. Where should I keep my medication? This medication is given in a hospital or clinic and will not be stored at home. NOTE: This sheet is a summary. It may not cover all possible information. If you have questions about this medicine, talk to your doctor, pharmacist, or health care provider.  2024 Elsevier/Gold  Standard (2022-06-02 00:00:00)

## 2024-11-15 NOTE — Assessment & Plan Note (Signed)
 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  Adjuvant chemotherapy with Taxol  and Herceptin  followed by Herceptin  maintenance completed 07/06/2022 2. breast radiation completed 01/04/2022 3.  Metastatic disease diagnosis:CT chest on 11/01/2022: Right upper lobe nodules largest measuring 5 mm, 01/13/23: Liver Biopsy: Met breast cancer ER 0%, PR 0%, Ki67 50%, HER2 3+  4.  Enhertu  02/07/2023-09/13/2023 5.  Herceptin  Tucatinib  Xeloda  December 2024-December 2025 ---------------------------------------------------------------------------------------------------------------------   CT CAP 01/15/2024: Liver lesions diminished in size (1.6 cm was 3.4 cm, 1.6 cm was 2.3 cm), diminished size of the right lower lobe lung nodule 0.6 cm was 0.9 cm CT CAP 04/04/2024: Stable hepatic metastases MRI Brain 05/29/24: No evidence of recurrent tumor in Rt post temporal lobe CT CAP 08/05/24: Increased size of liver met 2.2 cm (was 1.7 cm), Stable 10 mm and 1.4 cm Left lobe of liver mets, 5 mm lung nodule stable CT CAP 10/05/2024: Right lower lobe nodules slightly enlarged, increase in subcapsular right hepatic lobe 2.4 x 3 cm (was 1.8 x 2.2 cm), left hepatic lobe: Stable   Rash on the face: 10/16/2024: Treated with steroids.  Appears to be worse on the chin and the neck area   Current treatment: Margetuximab  with gemcitabine  every 3 weeks started 11/07/2024 Chemotoxicities: Profound rash on the face: This rash was present even prior to starting treatment.  We prescribed Medrol  Dosepak.  Consult dermatology.

## 2024-11-16 ENCOUNTER — Encounter: Payer: Self-pay | Admitting: Hematology and Oncology

## 2024-11-16 ENCOUNTER — Telehealth: Payer: Self-pay

## 2024-11-16 NOTE — Telephone Encounter (Signed)
 SABRA

## 2024-11-19 ENCOUNTER — Encounter: Payer: Self-pay | Admitting: Hematology and Oncology

## 2024-11-20 ENCOUNTER — Other Ambulatory Visit: Payer: Self-pay | Admitting: *Deleted

## 2024-11-20 DIAGNOSIS — R21 Rash and other nonspecific skin eruption: Secondary | ICD-10-CM

## 2024-11-20 NOTE — Progress Notes (Signed)
 Per MD request, RN placed referral to [facility] for treatment of ongoing facial rash.

## 2024-11-22 ENCOUNTER — Other Ambulatory Visit: Payer: Self-pay | Admitting: *Deleted

## 2024-11-22 MED ORDER — METHYLPREDNISOLONE 4 MG PO TBPK: ORAL_TABLET | ORAL | 0 refills | Status: AC

## 2024-11-22 NOTE — Progress Notes (Signed)
 Received mychart message from pt with complaint of swelling with a rash over bilateral eyelids.  RN reviewed images with MD and verbal orders received and placed for pt to be prescribed medrol  dosepak.  RN also contacted [facility] for urgent appt.  Scheduling states pt chart is under review by providers and they will be in touch with pt.  Pt educated and verbalized understanding.

## 2024-11-27 ENCOUNTER — Inpatient Hospital Stay

## 2024-11-27 ENCOUNTER — Inpatient Hospital Stay: Admitting: Hematology and Oncology

## 2024-11-29 ENCOUNTER — Inpatient Hospital Stay: Admitting: Physician Assistant

## 2024-11-29 ENCOUNTER — Inpatient Hospital Stay: Attending: Hematology and Oncology

## 2024-11-29 ENCOUNTER — Inpatient Hospital Stay

## 2024-12-03 ENCOUNTER — Inpatient Hospital Stay: Admitting: Hematology and Oncology

## 2024-12-11 ENCOUNTER — Ambulatory Visit: Admitting: Internal Medicine

## 2024-12-18 ENCOUNTER — Inpatient Hospital Stay

## 2024-12-18 ENCOUNTER — Inpatient Hospital Stay: Admitting: Hematology and Oncology

## 2024-12-20 ENCOUNTER — Inpatient Hospital Stay: Admitting: Hematology and Oncology

## 2024-12-20 ENCOUNTER — Inpatient Hospital Stay

## 2024-12-20 ENCOUNTER — Inpatient Hospital Stay: Attending: Hematology and Oncology

## 2025-01-08 ENCOUNTER — Inpatient Hospital Stay: Admitting: Hematology and Oncology

## 2025-01-08 ENCOUNTER — Inpatient Hospital Stay

## 2025-01-10 ENCOUNTER — Inpatient Hospital Stay

## 2025-01-10 ENCOUNTER — Inpatient Hospital Stay: Admitting: Hematology and Oncology
